# Patient Record
Sex: Female | Born: 1935 | Race: Black or African American | Hispanic: No | Smoking: Former smoker
Health system: Southern US, Community
[De-identification: ages and names within clinical notes are randomized; demographics above are authoritative.]

## PROBLEM LIST (undated history)

## (undated) ENCOUNTER — Emergency Department (HOSPITAL_COMMUNITY): Admission: EM | Payer: Medicare PPO | Source: Home / Self Care

## (undated) DIAGNOSIS — K5900 Constipation, unspecified: Secondary | ICD-10-CM

## (undated) DIAGNOSIS — F1020 Alcohol dependence, uncomplicated: Secondary | ICD-10-CM

## (undated) DIAGNOSIS — Z8719 Personal history of other diseases of the digestive system: Secondary | ICD-10-CM

## (undated) DIAGNOSIS — D631 Anemia in chronic kidney disease: Secondary | ICD-10-CM

## (undated) DIAGNOSIS — F1021 Alcohol dependence, in remission: Secondary | ICD-10-CM

## (undated) DIAGNOSIS — F0390 Unspecified dementia without behavioral disturbance: Secondary | ICD-10-CM

## (undated) DIAGNOSIS — K219 Gastro-esophageal reflux disease without esophagitis: Secondary | ICD-10-CM

## (undated) DIAGNOSIS — Z87442 Personal history of urinary calculi: Secondary | ICD-10-CM

## (undated) DIAGNOSIS — G8929 Other chronic pain: Secondary | ICD-10-CM

## (undated) DIAGNOSIS — K56609 Unspecified intestinal obstruction, unspecified as to partial versus complete obstruction: Secondary | ICD-10-CM

## (undated) DIAGNOSIS — R519 Headache, unspecified: Secondary | ICD-10-CM

## (undated) DIAGNOSIS — M549 Dorsalgia, unspecified: Secondary | ICD-10-CM

## (undated) DIAGNOSIS — I10 Essential (primary) hypertension: Secondary | ICD-10-CM

## (undated) DIAGNOSIS — B9620 Unspecified Escherichia coli [E. coli] as the cause of diseases classified elsewhere: Secondary | ICD-10-CM

## (undated) DIAGNOSIS — R079 Chest pain, unspecified: Secondary | ICD-10-CM

## (undated) DIAGNOSIS — F329 Major depressive disorder, single episode, unspecified: Secondary | ICD-10-CM

## (undated) DIAGNOSIS — Z992 Dependence on renal dialysis: Secondary | ICD-10-CM

## (undated) DIAGNOSIS — F419 Anxiety disorder, unspecified: Secondary | ICD-10-CM

## (undated) DIAGNOSIS — N185 Chronic kidney disease, stage 5: Secondary | ICD-10-CM

## (undated) DIAGNOSIS — E43 Unspecified severe protein-calorie malnutrition: Secondary | ICD-10-CM

## (undated) DIAGNOSIS — N186 End stage renal disease: Secondary | ICD-10-CM

## (undated) DIAGNOSIS — N390 Urinary tract infection, site not specified: Secondary | ICD-10-CM

## (undated) DIAGNOSIS — R7989 Other specified abnormal findings of blood chemistry: Secondary | ICD-10-CM

## (undated) DIAGNOSIS — D62 Acute posthemorrhagic anemia: Secondary | ICD-10-CM

## (undated) DIAGNOSIS — K862 Cyst of pancreas: Secondary | ICD-10-CM

## (undated) DIAGNOSIS — Z4659 Encounter for fitting and adjustment of other gastrointestinal appliance and device: Secondary | ICD-10-CM

## (undated) DIAGNOSIS — K921 Melena: Secondary | ICD-10-CM

## (undated) DIAGNOSIS — R410 Disorientation, unspecified: Secondary | ICD-10-CM

## (undated) DIAGNOSIS — S72001A Fracture of unspecified part of neck of right femur, initial encounter for closed fracture: Secondary | ICD-10-CM

## (undated) DIAGNOSIS — R778 Other specified abnormalities of plasma proteins: Secondary | ICD-10-CM

## (undated) DIAGNOSIS — R634 Abnormal weight loss: Secondary | ICD-10-CM

## (undated) DIAGNOSIS — F32A Depression, unspecified: Secondary | ICD-10-CM

## (undated) DIAGNOSIS — I4892 Unspecified atrial flutter: Secondary | ICD-10-CM

## (undated) DIAGNOSIS — Z8711 Personal history of peptic ulcer disease: Secondary | ICD-10-CM

## (undated) DIAGNOSIS — L0231 Cutaneous abscess of buttock: Secondary | ICD-10-CM

## (undated) DIAGNOSIS — N189 Chronic kidney disease, unspecified: Secondary | ICD-10-CM

## (undated) DIAGNOSIS — M1990 Unspecified osteoarthritis, unspecified site: Secondary | ICD-10-CM

## (undated) DIAGNOSIS — K259 Gastric ulcer, unspecified as acute or chronic, without hemorrhage or perforation: Secondary | ICD-10-CM

## (undated) DIAGNOSIS — K922 Gastrointestinal hemorrhage, unspecified: Secondary | ICD-10-CM

## (undated) DIAGNOSIS — K254 Chronic or unspecified gastric ulcer with hemorrhage: Secondary | ICD-10-CM

## (undated) DIAGNOSIS — R51 Headache: Secondary | ICD-10-CM

## (undated) HISTORY — PX: TIBIA FRACTURE SURGERY: SHX806

## (undated) HISTORY — DX: Anemia in chronic kidney disease: D63.1

## (undated) HISTORY — DX: Alcohol dependence, in remission: F10.21

## (undated) HISTORY — DX: Other specified abnormalities of plasma proteins: R77.8

## (undated) HISTORY — DX: Fracture of unspecified part of neck of right femur, initial encounter for closed fracture: S72.001A

## (undated) HISTORY — DX: Personal history of other diseases of the digestive system: Z87.19

## (undated) HISTORY — DX: Chest pain, unspecified: R07.9

## (undated) HISTORY — DX: Disorientation, unspecified: R41.0

## (undated) HISTORY — DX: Unspecified intestinal obstruction, unspecified as to partial versus complete obstruction: K56.609

## (undated) HISTORY — PX: TUBAL LIGATION: SHX77

## (undated) HISTORY — DX: Chronic or unspecified gastric ulcer with hemorrhage: K25.4

## (undated) HISTORY — DX: Cutaneous abscess of buttock: L02.31

## (undated) HISTORY — DX: Abnormal weight loss: R63.4

## (undated) HISTORY — DX: Other specified abnormal findings of blood chemistry: R79.89

## (undated) HISTORY — PX: CATARACT EXTRACTION: SUR2

## (undated) HISTORY — PX: ABDOMINAL HYSTERECTOMY: SHX81

## (undated) HISTORY — DX: Acute posthemorrhagic anemia: D62

## (undated) HISTORY — DX: Gastrointestinal hemorrhage, unspecified: K92.2

## (undated) HISTORY — DX: Unspecified severe protein-calorie malnutrition: E43

## (undated) HISTORY — DX: Unspecified Escherichia coli (E. coli) as the cause of diseases classified elsewhere: B96.20

## (undated) HISTORY — DX: Chronic kidney disease, stage 5: N18.5

## (undated) HISTORY — DX: Urinary tract infection, site not specified: N39.0

## (undated) HISTORY — PX: FOOT SURGERY: SHX648

## (undated) HISTORY — DX: Melena: K92.1

## (undated) HISTORY — DX: Encounter for fitting and adjustment of other gastrointestinal appliance and device: Z46.59

---

## 1978-12-08 HISTORY — PX: EXPLORATORY LAPAROTOMY: SUR591

## 1978-12-08 HISTORY — PX: TOTAL ABDOMINAL HYSTERECTOMY W/ BILATERAL SALPINGOOPHORECTOMY: SHX83

## 1998-04-18 ENCOUNTER — Ambulatory Visit (HOSPITAL_COMMUNITY): Admission: RE | Admit: 1998-04-18 | Discharge: 1998-04-18 | Payer: Self-pay | Admitting: Orthopedic Surgery

## 1998-04-18 ENCOUNTER — Ambulatory Visit (HOSPITAL_COMMUNITY): Admission: RE | Admit: 1998-04-18 | Discharge: 1998-04-18 | Payer: Self-pay | Admitting: Nephrology

## 1998-08-24 ENCOUNTER — Ambulatory Visit (HOSPITAL_COMMUNITY): Admission: RE | Admit: 1998-08-24 | Discharge: 1998-08-24 | Payer: Self-pay | Admitting: Nephrology

## 1998-08-24 ENCOUNTER — Encounter: Payer: Self-pay | Admitting: Nephrology

## 1999-01-25 ENCOUNTER — Ambulatory Visit (HOSPITAL_COMMUNITY): Admission: RE | Admit: 1999-01-25 | Discharge: 1999-01-25 | Payer: Self-pay | Admitting: Obstetrics and Gynecology

## 1999-01-25 ENCOUNTER — Encounter: Payer: Self-pay | Admitting: Obstetrics and Gynecology

## 1999-04-03 ENCOUNTER — Encounter: Payer: Self-pay | Admitting: Nephrology

## 1999-04-03 ENCOUNTER — Inpatient Hospital Stay (HOSPITAL_COMMUNITY): Admission: EM | Admit: 1999-04-03 | Discharge: 1999-04-09 | Payer: Self-pay | Admitting: Emergency Medicine

## 1999-04-04 ENCOUNTER — Encounter: Payer: Self-pay | Admitting: Nephrology

## 1999-04-05 ENCOUNTER — Encounter: Payer: Self-pay | Admitting: Nephrology

## 1999-04-06 ENCOUNTER — Encounter: Payer: Self-pay | Admitting: Nephrology

## 1999-04-07 ENCOUNTER — Encounter: Payer: Self-pay | Admitting: Nephrology

## 1999-04-08 ENCOUNTER — Encounter: Payer: Self-pay | Admitting: Nephrology

## 1999-07-18 ENCOUNTER — Encounter: Payer: Self-pay | Admitting: Cardiology

## 1999-07-18 ENCOUNTER — Ambulatory Visit (HOSPITAL_COMMUNITY): Admission: RE | Admit: 1999-07-18 | Discharge: 1999-07-18 | Payer: Self-pay | Admitting: Cardiology

## 1999-10-19 ENCOUNTER — Encounter: Payer: Self-pay | Admitting: Emergency Medicine

## 1999-10-19 ENCOUNTER — Inpatient Hospital Stay (HOSPITAL_COMMUNITY): Admission: EM | Admit: 1999-10-19 | Discharge: 1999-10-22 | Payer: Self-pay | Admitting: Emergency Medicine

## 1999-10-20 ENCOUNTER — Encounter (HOSPITAL_BASED_OUTPATIENT_CLINIC_OR_DEPARTMENT_OTHER): Payer: Self-pay | Admitting: General Surgery

## 1999-10-21 ENCOUNTER — Encounter (HOSPITAL_BASED_OUTPATIENT_CLINIC_OR_DEPARTMENT_OTHER): Payer: Self-pay | Admitting: General Surgery

## 1999-11-11 ENCOUNTER — Emergency Department (HOSPITAL_COMMUNITY): Admission: EM | Admit: 1999-11-11 | Discharge: 1999-11-11 | Payer: Self-pay | Admitting: Emergency Medicine

## 1999-11-11 ENCOUNTER — Encounter: Payer: Self-pay | Admitting: Emergency Medicine

## 2000-05-18 ENCOUNTER — Other Ambulatory Visit: Admission: RE | Admit: 2000-05-18 | Discharge: 2000-05-18 | Payer: Self-pay | Admitting: Nephrology

## 2000-07-09 ENCOUNTER — Encounter: Payer: Self-pay | Admitting: Obstetrics and Gynecology

## 2000-07-09 ENCOUNTER — Ambulatory Visit (HOSPITAL_COMMUNITY): Admission: RE | Admit: 2000-07-09 | Discharge: 2000-07-09 | Payer: Self-pay | Admitting: Obstetrics and Gynecology

## 2000-10-13 ENCOUNTER — Encounter: Admission: RE | Admit: 2000-10-13 | Discharge: 2000-10-13 | Payer: Self-pay | Admitting: Nephrology

## 2000-10-13 ENCOUNTER — Encounter: Payer: Self-pay | Admitting: Nephrology

## 2002-05-30 ENCOUNTER — Emergency Department (HOSPITAL_COMMUNITY): Admission: EM | Admit: 2002-05-30 | Discharge: 2002-05-30 | Payer: Self-pay | Admitting: Emergency Medicine

## 2002-09-01 ENCOUNTER — Encounter: Admission: RE | Admit: 2002-09-01 | Discharge: 2002-09-01 | Payer: Self-pay | Admitting: Nephrology

## 2002-09-01 ENCOUNTER — Encounter: Payer: Self-pay | Admitting: Nephrology

## 2003-04-17 ENCOUNTER — Encounter: Payer: Self-pay | Admitting: Emergency Medicine

## 2003-04-17 ENCOUNTER — Emergency Department (HOSPITAL_COMMUNITY): Admission: EM | Admit: 2003-04-17 | Discharge: 2003-04-17 | Payer: Self-pay | Admitting: Emergency Medicine

## 2003-05-26 ENCOUNTER — Ambulatory Visit (HOSPITAL_COMMUNITY): Admission: RE | Admit: 2003-05-26 | Discharge: 2003-05-26 | Payer: Self-pay | Admitting: Nephrology

## 2003-05-26 ENCOUNTER — Encounter: Payer: Self-pay | Admitting: Nephrology

## 2003-07-04 ENCOUNTER — Emergency Department (HOSPITAL_COMMUNITY): Admission: EM | Admit: 2003-07-04 | Discharge: 2003-07-04 | Payer: Self-pay | Admitting: Emergency Medicine

## 2003-07-18 ENCOUNTER — Other Ambulatory Visit: Admission: RE | Admit: 2003-07-18 | Discharge: 2003-07-18 | Payer: Self-pay | Admitting: Obstetrics and Gynecology

## 2003-08-09 HISTORY — PX: SHOULDER ARTHROSCOPY W/ ROTATOR CUFF REPAIR: SHX2400

## 2003-08-16 ENCOUNTER — Ambulatory Visit (HOSPITAL_BASED_OUTPATIENT_CLINIC_OR_DEPARTMENT_OTHER): Admission: RE | Admit: 2003-08-16 | Discharge: 2003-08-16 | Payer: Self-pay | Admitting: Orthopedic Surgery

## 2003-10-02 ENCOUNTER — Encounter: Payer: Self-pay | Admitting: Nephrology

## 2003-10-02 ENCOUNTER — Encounter: Admission: RE | Admit: 2003-10-02 | Discharge: 2003-10-02 | Payer: Self-pay | Admitting: Nephrology

## 2003-10-06 ENCOUNTER — Ambulatory Visit (HOSPITAL_COMMUNITY): Admission: RE | Admit: 2003-10-06 | Discharge: 2003-10-06 | Payer: Self-pay | Admitting: Neurology

## 2003-10-19 ENCOUNTER — Ambulatory Visit (HOSPITAL_COMMUNITY): Admission: RE | Admit: 2003-10-19 | Discharge: 2003-10-19 | Payer: Self-pay | Admitting: Obstetrics and Gynecology

## 2003-10-25 ENCOUNTER — Ambulatory Visit (HOSPITAL_COMMUNITY): Admission: RE | Admit: 2003-10-25 | Discharge: 2003-10-25 | Payer: Self-pay | Admitting: Nephrology

## 2003-12-04 ENCOUNTER — Ambulatory Visit (HOSPITAL_COMMUNITY): Admission: RE | Admit: 2003-12-04 | Discharge: 2003-12-04 | Payer: Self-pay | Admitting: Nephrology

## 2003-12-06 ENCOUNTER — Ambulatory Visit (HOSPITAL_COMMUNITY): Admission: RE | Admit: 2003-12-06 | Discharge: 2003-12-06 | Payer: Self-pay | Admitting: Nephrology

## 2003-12-20 ENCOUNTER — Inpatient Hospital Stay (HOSPITAL_COMMUNITY): Admission: EM | Admit: 2003-12-20 | Discharge: 2003-12-26 | Payer: Self-pay | Admitting: Emergency Medicine

## 2004-01-24 ENCOUNTER — Encounter: Admission: RE | Admit: 2004-01-24 | Discharge: 2004-01-24 | Payer: Self-pay | Admitting: *Deleted

## 2005-10-21 ENCOUNTER — Encounter: Admission: RE | Admit: 2005-10-21 | Discharge: 2005-10-21 | Payer: Self-pay | Admitting: Nephrology

## 2005-11-17 ENCOUNTER — Other Ambulatory Visit: Admission: RE | Admit: 2005-11-17 | Discharge: 2005-11-17 | Payer: Self-pay | Admitting: *Deleted

## 2006-02-21 ENCOUNTER — Emergency Department (HOSPITAL_COMMUNITY): Admission: EM | Admit: 2006-02-21 | Discharge: 2006-02-21 | Payer: Self-pay | Admitting: Emergency Medicine

## 2006-04-24 ENCOUNTER — Emergency Department (HOSPITAL_COMMUNITY): Admission: EM | Admit: 2006-04-24 | Discharge: 2006-04-24 | Payer: Self-pay | Admitting: Emergency Medicine

## 2006-10-07 ENCOUNTER — Ambulatory Visit (HOSPITAL_COMMUNITY): Admission: RE | Admit: 2006-10-07 | Discharge: 2006-10-07 | Payer: Self-pay | Admitting: Neurology

## 2007-09-15 ENCOUNTER — Encounter: Admission: RE | Admit: 2007-09-15 | Discharge: 2007-09-15 | Payer: Self-pay | Admitting: Nephrology

## 2007-11-19 ENCOUNTER — Emergency Department (HOSPITAL_COMMUNITY): Admission: EM | Admit: 2007-11-19 | Discharge: 2007-11-19 | Payer: Self-pay | Admitting: Family Medicine

## 2008-12-08 HISTORY — PX: COLONOSCOPY: SHX174

## 2009-01-25 ENCOUNTER — Inpatient Hospital Stay (HOSPITAL_COMMUNITY): Admission: EM | Admit: 2009-01-25 | Discharge: 2009-01-29 | Payer: Self-pay | Admitting: Emergency Medicine

## 2009-03-11 ENCOUNTER — Emergency Department (HOSPITAL_COMMUNITY): Admission: EM | Admit: 2009-03-11 | Discharge: 2009-03-11 | Payer: Self-pay | Admitting: Family Medicine

## 2009-06-08 ENCOUNTER — Emergency Department (HOSPITAL_COMMUNITY): Admission: EM | Admit: 2009-06-08 | Discharge: 2009-06-08 | Payer: Self-pay | Admitting: Family Medicine

## 2009-07-08 ENCOUNTER — Emergency Department (HOSPITAL_COMMUNITY): Admission: EM | Admit: 2009-07-08 | Discharge: 2009-07-08 | Payer: Self-pay | Admitting: Family Medicine

## 2010-03-14 ENCOUNTER — Ambulatory Visit (HOSPITAL_COMMUNITY): Admission: RE | Admit: 2010-03-14 | Discharge: 2010-03-14 | Payer: Self-pay | Admitting: Nephrology

## 2010-09-07 DIAGNOSIS — K862 Cyst of pancreas: Secondary | ICD-10-CM

## 2010-09-07 HISTORY — DX: Cyst of pancreas: K86.2

## 2010-09-12 ENCOUNTER — Emergency Department (HOSPITAL_COMMUNITY): Admission: EM | Admit: 2010-09-12 | Discharge: 2010-09-12 | Payer: Self-pay | Admitting: Family Medicine

## 2010-09-13 ENCOUNTER — Observation Stay (HOSPITAL_COMMUNITY): Admission: EM | Admit: 2010-09-13 | Discharge: 2010-09-16 | Payer: Self-pay | Admitting: Emergency Medicine

## 2010-09-13 ENCOUNTER — Ambulatory Visit: Payer: Self-pay | Admitting: Gastroenterology

## 2010-10-28 ENCOUNTER — Encounter: Admission: RE | Admit: 2010-10-28 | Discharge: 2010-10-28 | Payer: Self-pay | Admitting: Nephrology

## 2010-12-28 ENCOUNTER — Encounter: Payer: Self-pay | Admitting: Nephrology

## 2010-12-29 ENCOUNTER — Encounter: Payer: Self-pay | Admitting: Nephrology

## 2010-12-30 ENCOUNTER — Encounter: Payer: Self-pay | Admitting: Nephrology

## 2011-02-19 LAB — SAMPLE TO BLOOD BANK

## 2011-02-19 LAB — POCT I-STAT, CHEM 8
BUN: 34 mg/dL — ABNORMAL HIGH (ref 6–23)
Creatinine, Ser: 3 mg/dL — ABNORMAL HIGH (ref 0.4–1.2)
HCT: 40 % (ref 36.0–46.0)
Hemoglobin: 13.6 g/dL (ref 12.0–15.0)
Potassium: 4.1 mEq/L (ref 3.5–5.1)
TCO2: 25 mmol/L (ref 0–100)

## 2011-02-19 LAB — DIFFERENTIAL
Basophils Absolute: 0 K/uL (ref 0.0–0.1)
Basophils Relative: 0 % (ref 0–1)
Eosinophils Absolute: 0.2 10*3/uL (ref 0.0–0.7)
Eosinophils Relative: 3 % (ref 0–5)
Lymphocytes Relative: 43 % (ref 12–46)
Lymphs Abs: 3.5 K/uL (ref 0.7–4.0)
Monocytes Absolute: 0.6 K/uL (ref 0.1–1.0)
Monocytes Relative: 7 % (ref 3–12)
Neutro Abs: 3.8 K/uL (ref 1.7–7.7)
Neutrophils Relative %: 48 % (ref 43–77)

## 2011-02-19 LAB — PROTIME-INR
INR: 0.93 (ref 0.00–1.49)
Prothrombin Time: 12.7 s (ref 11.6–15.2)

## 2011-02-19 LAB — CBC
HCT: 30.8 % — ABNORMAL LOW (ref 36.0–46.0)
HCT: 32.9 % — ABNORMAL LOW (ref 36.0–46.0)
HCT: 33.3 % — ABNORMAL LOW (ref 36.0–46.0)
HCT: 35.6 % — ABNORMAL LOW (ref 36.0–46.0)
HCT: 36 % (ref 36.0–46.0)
Hemoglobin: 10.8 g/dL — ABNORMAL LOW (ref 12.0–15.0)
Hemoglobin: 10.9 g/dL — ABNORMAL LOW (ref 12.0–15.0)
Hemoglobin: 11.7 g/dL — ABNORMAL LOW (ref 12.0–15.0)
Hemoglobin: 12 g/dL (ref 12.0–15.0)
MCH: 28.1 pg (ref 26.0–34.0)
MCH: 28.1 pg (ref 26.0–34.0)
MCHC: 32.7 g/dL (ref 30.0–36.0)
MCHC: 32.8 g/dL (ref 30.0–36.0)
MCHC: 32.9 g/dL (ref 30.0–36.0)
MCHC: 33.3 g/dL (ref 30.0–36.0)
MCV: 83.9 fL (ref 78.0–100.0)
MCV: 85.4 fL (ref 78.0–100.0)
MCV: 85.7 fL (ref 78.0–100.0)
MCV: 85.8 fL (ref 78.0–100.0)
Platelets: 230 K/uL (ref 150–400)
RBC: 3.59 MIL/uL — ABNORMAL LOW (ref 3.87–5.11)
RBC: 3.82 MIL/uL — ABNORMAL LOW (ref 3.87–5.11)
RBC: 4.17 MIL/uL (ref 3.87–5.11)
RDW: 13.6 % (ref 11.5–15.5)
RDW: 13.6 % (ref 11.5–15.5)
RDW: 13.6 % (ref 11.5–15.5)
WBC: 6.7 10*3/uL (ref 4.0–10.5)
WBC: 6.8 10*3/uL (ref 4.0–10.5)
WBC: 6.8 10*3/uL (ref 4.0–10.5)
WBC: 7.5 10*3/uL (ref 4.0–10.5)
WBC: 8.1 10*3/uL (ref 4.0–10.5)
WBC: 8.1 10*3/uL (ref 4.0–10.5)

## 2011-02-19 LAB — COMPREHENSIVE METABOLIC PANEL
ALT: 15 U/L (ref 0–35)
Albumin: 3.7 g/dL (ref 3.5–5.2)
CO2: 24 mEq/L (ref 19–32)
Chloride: 110 mEq/L (ref 96–112)
Creatinine, Ser: 3.05 mg/dL — ABNORMAL HIGH (ref 0.4–1.2)
GFR calc non Af Amer: 15 mL/min — ABNORMAL LOW (ref >60)
Potassium: 4.3 mEq/L (ref 3.5–5.1)
Total Protein: 7.1 g/dL (ref 6.0–8.3)

## 2011-02-19 LAB — COMPREHENSIVE METABOLIC PANEL WITH GFR
AST: 21 U/L (ref 0–37)
Alkaline Phosphatase: 67 U/L (ref 39–117)
BUN: 33 mg/dL — ABNORMAL HIGH (ref 6–23)
Calcium: 8.8 mg/dL (ref 8.4–10.5)
GFR calc Af Amer: 18 mL/min — ABNORMAL LOW (ref >60)
Glucose, Bld: 96 mg/dL (ref 70–99)
Sodium: 141 meq/L (ref 135–145)
Total Bilirubin: 0.3 mg/dL (ref 0.3–1.2)

## 2011-02-19 LAB — STOOL CULTURE

## 2011-02-19 LAB — URINALYSIS, ROUTINE W REFLEX MICROSCOPIC
Bilirubin Urine: NEGATIVE
Hgb urine dipstick: NEGATIVE
Ketones, ur: NEGATIVE mg/dL
Nitrite: NEGATIVE

## 2011-02-19 LAB — BASIC METABOLIC PANEL
BUN: 25 mg/dL — ABNORMAL HIGH (ref 6–23)
CO2: 24 mEq/L (ref 19–32)
CO2: 25 mEq/L (ref 19–32)
Calcium: 8.3 mg/dL — ABNORMAL LOW (ref 8.4–10.5)
Calcium: 8.6 mg/dL (ref 8.4–10.5)
Chloride: 108 mEq/L (ref 96–112)
Creatinine, Ser: 2.64 mg/dL — ABNORMAL HIGH (ref 0.4–1.2)
Creatinine, Ser: 2.74 mg/dL — ABNORMAL HIGH (ref 0.4–1.2)
GFR calc Af Amer: 21 mL/min — ABNORMAL LOW (ref >60)
GFR calc non Af Amer: 17 mL/min — ABNORMAL LOW (ref >60)
GFR calc non Af Amer: 17 mL/min — ABNORMAL LOW (ref >60)
Glucose, Bld: 94 mg/dL (ref 70–99)
Potassium: 3.9 mEq/L (ref 3.5–5.1)
Potassium: 4 mEq/L (ref 3.5–5.1)
Potassium: 4 mEq/L (ref 3.5–5.1)
Sodium: 139 mEq/L (ref 135–145)
Sodium: 140 mEq/L (ref 135–145)
Sodium: 141 mEq/L (ref 135–145)

## 2011-02-19 LAB — URINE CULTURE: Culture  Setup Time: 201110091201

## 2011-02-19 LAB — APTT: aPTT: 26 s (ref 24–37)

## 2011-03-15 LAB — POCT URINALYSIS DIP (DEVICE)
Bilirubin Urine: NEGATIVE
Ketones, ur: NEGATIVE mg/dL
pH: 5 (ref 5.0–8.0)

## 2011-03-16 LAB — POCT I-STAT, CHEM 8
BUN: 38 mg/dL — ABNORMAL HIGH (ref 6–23)
Calcium, Ion: 1.07 mmol/L — ABNORMAL LOW (ref 1.12–1.32)
Creatinine, Ser: 2.8 mg/dL — ABNORMAL HIGH (ref 0.4–1.2)
Glucose, Bld: 97 mg/dL (ref 70–99)
TCO2: 25 mmol/L (ref 0–100)

## 2011-03-16 LAB — POCT URINALYSIS DIP (DEVICE)
Ketones, ur: NEGATIVE mg/dL
Protein, ur: 100 mg/dL — AB
Specific Gravity, Urine: 1.01 (ref 1.005–1.030)
pH: 6 (ref 5.0–8.0)

## 2011-03-25 LAB — COMPREHENSIVE METABOLIC PANEL
AST: 16 U/L (ref 0–37)
AST: 17 U/L (ref 0–37)
Albumin: 3.1 g/dL — ABNORMAL LOW (ref 3.5–5.2)
Albumin: 3.9 g/dL (ref 3.5–5.2)
Alkaline Phosphatase: 69 U/L (ref 39–117)
BUN: 17 mg/dL (ref 6–23)
BUN: 23 mg/dL (ref 6–23)
CO2: 26 mEq/L (ref 19–32)
Calcium: 8.6 mg/dL (ref 8.4–10.5)
Chloride: 107 mEq/L (ref 96–112)
Chloride: 108 mEq/L (ref 96–112)
Chloride: 109 mEq/L (ref 96–112)
Creatinine, Ser: 2.34 mg/dL — ABNORMAL HIGH (ref 0.4–1.2)
Creatinine, Ser: 2.42 mg/dL — ABNORMAL HIGH (ref 0.4–1.2)
GFR calc Af Amer: 25 mL/min — ABNORMAL LOW (ref >60)
GFR calc Af Amer: 25 mL/min — ABNORMAL LOW (ref >60)
GFR calc non Af Amer: 20 mL/min — ABNORMAL LOW (ref >60)
Potassium: 3.9 mEq/L (ref 3.5–5.1)
Sodium: 139 mEq/L (ref 135–145)
Total Bilirubin: 0.4 mg/dL (ref 0.3–1.2)
Total Bilirubin: 0.7 mg/dL (ref 0.3–1.2)
Total Bilirubin: 0.8 mg/dL (ref 0.3–1.2)
Total Protein: 6.2 g/dL (ref 6.0–8.3)
Total Protein: 7.6 g/dL (ref 6.0–8.3)

## 2011-03-25 LAB — DIFFERENTIAL
Basophils Absolute: 0 10*3/uL (ref 0.0–0.1)
Basophils Absolute: 0 10*3/uL (ref 0.0–0.1)
Basophils Absolute: 0.1 10*3/uL (ref 0.0–0.1)
Basophils Absolute: 0.1 10*3/uL (ref 0.0–0.1)
Basophils Relative: 1 % (ref 0–1)
Basophils Relative: 1 % (ref 0–1)
Eosinophils Relative: 3 % (ref 0–5)
Lymphocytes Relative: 25 % (ref 12–46)
Lymphocytes Relative: 29 % (ref 12–46)
Lymphocytes Relative: 34 % (ref 12–46)
Lymphs Abs: 2 10*3/uL (ref 0.7–4.0)
Monocytes Absolute: 0.5 10*3/uL (ref 0.1–1.0)
Monocytes Absolute: 0.6 10*3/uL (ref 0.1–1.0)
Monocytes Relative: 6 % (ref 3–12)
Neutro Abs: 4.2 10*3/uL (ref 1.7–7.7)
Neutro Abs: 4.5 10*3/uL (ref 1.7–7.7)
Neutro Abs: 6.6 10*3/uL (ref 1.7–7.7)
Neutrophils Relative %: 55 % (ref 43–77)
Neutrophils Relative %: 61 % (ref 43–77)

## 2011-03-25 LAB — LIPID PANEL
HDL: 29 mg/dL — ABNORMAL LOW (ref >39)
HDL: 30 mg/dL — ABNORMAL LOW (ref >39)
LDL Cholesterol: 86 mg/dL (ref 0–99)
Total CHOL/HDL Ratio: 5.6 RATIO
Triglycerides: 256 mg/dL — ABNORMAL HIGH (ref <150)
VLDL: 51 mg/dL — ABNORMAL HIGH (ref 0–40)
VLDL: 55 mg/dL — ABNORMAL HIGH (ref 0–40)

## 2011-03-25 LAB — CBC
HCT: 33 % — ABNORMAL LOW (ref 36.0–46.0)
HCT: 42.7 % (ref 36.0–46.0)
MCHC: 32.5 g/dL (ref 30.0–36.0)
MCHC: 32.7 g/dL (ref 30.0–36.0)
MCV: 90.5 fL (ref 78.0–100.0)
MCV: 91.4 fL (ref 78.0–100.0)
Platelets: 167 10*3/uL (ref 150–400)
Platelets: 189 10*3/uL (ref 150–400)
Platelets: 207 10*3/uL (ref 150–400)
RBC: 3.64 MIL/uL — ABNORMAL LOW (ref 3.87–5.11)
RDW: 12.6 % (ref 11.5–15.5)
RDW: 12.6 % (ref 11.5–15.5)
WBC: 6.9 10*3/uL (ref 4.0–10.5)
WBC: 8.1 10*3/uL (ref 4.0–10.5)

## 2011-03-25 LAB — URINE MICROSCOPIC-ADD ON

## 2011-03-25 LAB — RENAL FUNCTION PANEL
Albumin: 3.5 g/dL (ref 3.5–5.2)
Calcium: 9.4 mg/dL (ref 8.4–10.5)
Phosphorus: 3.2 mg/dL (ref 2.3–4.6)
Potassium: 4.4 mEq/L (ref 3.5–5.1)
Sodium: 139 mEq/L (ref 135–145)

## 2011-03-25 LAB — URINALYSIS, ROUTINE W REFLEX MICROSCOPIC
Bilirubin Urine: NEGATIVE
Leukocytes, UA: NEGATIVE
Nitrite: NEGATIVE
Specific Gravity, Urine: 1.012 (ref 1.005–1.030)
pH: 5.5 (ref 5.0–8.0)

## 2011-03-25 LAB — URIC ACID, RANDOM URINE: Uric Acid, Urine: 16.3 mg/dL

## 2011-03-25 LAB — RPR: RPR Ser Ql: NONREACTIVE

## 2011-03-25 LAB — FOLATE RBC: RBC Folate: 619 ng/mL — ABNORMAL HIGH (ref 180–600)

## 2011-03-25 LAB — URIC ACID: Uric Acid, Serum: 6.7 mg/dL (ref 2.4–7.0)

## 2011-03-25 LAB — CK: Total CK: 694 U/L — ABNORMAL HIGH (ref 7–177)

## 2011-03-25 LAB — VITAMIN B12: Vitamin B-12: 410 pg/mL (ref 211–911)

## 2011-03-30 ENCOUNTER — Emergency Department (HOSPITAL_COMMUNITY): Payer: Medicare Other

## 2011-03-30 ENCOUNTER — Inpatient Hospital Stay (HOSPITAL_COMMUNITY)
Admission: EM | Admit: 2011-03-30 | Discharge: 2011-04-08 | DRG: 493 | Disposition: A | Discharge status: Skilled Nursing Facility | Payer: Medicare Other | Attending: Specialist | Admitting: Specialist

## 2011-03-30 DIAGNOSIS — I129 Hypertensive chronic kidney disease with stage 1 through stage 4 chronic kidney disease, or unspecified chronic kidney disease: Secondary | ICD-10-CM | POA: Diagnosis present

## 2011-03-30 DIAGNOSIS — X500XXA Overexertion from strenuous movement or load, initial encounter: Secondary | ICD-10-CM | POA: Diagnosis present

## 2011-03-30 DIAGNOSIS — F0390 Unspecified dementia without behavioral disturbance: Secondary | ICD-10-CM | POA: Diagnosis present

## 2011-03-30 DIAGNOSIS — Y92009 Unspecified place in unspecified non-institutional (private) residence as the place of occurrence of the external cause: Secondary | ICD-10-CM

## 2011-03-30 DIAGNOSIS — D62 Acute posthemorrhagic anemia: Secondary | ICD-10-CM | POA: Diagnosis not present

## 2011-03-30 DIAGNOSIS — Z87891 Personal history of nicotine dependence: Secondary | ICD-10-CM

## 2011-03-30 DIAGNOSIS — F1020 Alcohol dependence, uncomplicated: Secondary | ICD-10-CM | POA: Diagnosis present

## 2011-03-30 DIAGNOSIS — S82209A Unspecified fracture of shaft of unspecified tibia, initial encounter for closed fracture: Principal | ICD-10-CM | POA: Diagnosis present

## 2011-03-30 DIAGNOSIS — D638 Anemia in other chronic diseases classified elsewhere: Secondary | ICD-10-CM | POA: Diagnosis present

## 2011-03-30 DIAGNOSIS — N184 Chronic kidney disease, stage 4 (severe): Secondary | ICD-10-CM | POA: Diagnosis present

## 2011-03-30 DIAGNOSIS — K219 Gastro-esophageal reflux disease without esophagitis: Secondary | ICD-10-CM | POA: Diagnosis present

## 2011-03-30 DIAGNOSIS — Z880 Allergy status to penicillin: Secondary | ICD-10-CM

## 2011-03-30 DIAGNOSIS — Z882 Allergy status to sulfonamides status: Secondary | ICD-10-CM

## 2011-03-30 DIAGNOSIS — R296 Repeated falls: Secondary | ICD-10-CM | POA: Diagnosis present

## 2011-03-30 DIAGNOSIS — Z888 Allergy status to other drugs, medicaments and biological substances status: Secondary | ICD-10-CM

## 2011-03-30 LAB — DIFFERENTIAL
Basophils Relative: 0 % (ref 0–1)
Eosinophils Absolute: 0 10*3/uL (ref 0.0–0.7)
Eosinophils Relative: 0 % (ref 0–5)
Lymphs Abs: 2.4 10*3/uL (ref 0.7–4.0)
Monocytes Relative: 7 % (ref 3–12)
Neutrophils Relative %: 73 % (ref 43–77)

## 2011-03-30 LAB — BASIC METABOLIC PANEL
CO2: 24 mEq/L (ref 19–32)
Calcium: 9.4 mg/dL (ref 8.4–10.5)
Creatinine, Ser: 2.43 mg/dL — ABNORMAL HIGH (ref 0.4–1.2)
GFR calc non Af Amer: 19 mL/min — ABNORMAL LOW (ref >60)
Glucose, Bld: 114 mg/dL — ABNORMAL HIGH (ref 70–99)
Sodium: 140 mEq/L (ref 135–145)

## 2011-03-30 LAB — CBC
MCH: 27.9 pg (ref 26.0–34.0)
MCV: 83.7 fL (ref 78.0–100.0)
Platelets: 253 10*3/uL (ref 150–400)
RBC: 4.48 MIL/uL (ref 3.87–5.11)
RDW: 14 % (ref 11.5–15.5)
WBC: 11.8 10*3/uL — ABNORMAL HIGH (ref 4.0–10.5)

## 2011-03-30 LAB — PROTIME-INR: Prothrombin Time: 13 seconds (ref 11.6–15.2)

## 2011-03-31 ENCOUNTER — Inpatient Hospital Stay (HOSPITAL_COMMUNITY): Payer: Medicare Other

## 2011-03-31 LAB — BASIC METABOLIC PANEL
BUN: 22 mg/dL (ref 6–23)
Calcium: 8.4 mg/dL (ref 8.4–10.5)
Chloride: 107 mEq/L (ref 96–112)
Creatinine, Ser: 2.36 mg/dL — ABNORMAL HIGH (ref 0.4–1.2)
GFR calc Af Amer: 24 mL/min — ABNORMAL LOW (ref >60)
GFR calc non Af Amer: 20 mL/min — ABNORMAL LOW (ref >60)

## 2011-03-31 LAB — CBC
MCH: 28.1 pg (ref 26.0–34.0)
MCHC: 33.8 g/dL (ref 30.0–36.0)
MCV: 83.2 fL (ref 78.0–100.0)
Platelets: ADEQUATE 10*3/uL (ref 150–400)
RDW: 14.2 % (ref 11.5–15.5)

## 2011-04-01 LAB — BASIC METABOLIC PANEL
BUN: 20 mg/dL (ref 6–23)
CO2: 27 mEq/L (ref 19–32)
Calcium: 8.7 mg/dL (ref 8.4–10.5)
Glucose, Bld: 110 mg/dL — ABNORMAL HIGH (ref 70–99)
Sodium: 142 mEq/L (ref 135–145)

## 2011-04-01 LAB — SURGICAL PCR SCREEN
MRSA, PCR: NEGATIVE
Staphylococcus aureus: POSITIVE — AB

## 2011-04-01 LAB — CBC
Hemoglobin: 10.7 g/dL — ABNORMAL LOW (ref 12.0–15.0)
MCH: 26.8 pg (ref 26.0–34.0)
MCHC: 31.9 g/dL (ref 30.0–36.0)
RDW: 14 % (ref 11.5–15.5)

## 2011-04-01 LAB — PROTIME-INR: Prothrombin Time: 13.4 seconds (ref 11.6–15.2)

## 2011-04-02 ENCOUNTER — Inpatient Hospital Stay (HOSPITAL_COMMUNITY): Payer: Medicare Other

## 2011-04-02 LAB — URIC ACID: Uric Acid, Serum: 6.3 mg/dL (ref 2.4–7.0)

## 2011-04-02 LAB — RENAL FUNCTION PANEL
Albumin: 3.2 g/dL — ABNORMAL LOW (ref 3.5–5.2)
BUN: 19 mg/dL (ref 6–23)
Chloride: 101 mEq/L (ref 96–112)
Glucose, Bld: 126 mg/dL — ABNORMAL HIGH (ref 70–99)
Potassium: 4.3 mEq/L (ref 3.5–5.1)

## 2011-04-02 LAB — DIFFERENTIAL
Lymphocytes Relative: 18 % (ref 12–46)
Lymphs Abs: 2.2 10*3/uL (ref 0.7–4.0)
Neutro Abs: 8.6 10*3/uL — ABNORMAL HIGH (ref 1.7–7.7)
Neutrophils Relative %: 71 % (ref 43–77)

## 2011-04-02 LAB — CBC
HCT: 33 % — ABNORMAL LOW (ref 36.0–46.0)
MCV: 82.9 fL (ref 78.0–100.0)
Platelets: 207 10*3/uL (ref 150–400)
RBC: 3.98 MIL/uL (ref 3.87–5.11)
WBC: 12.1 10*3/uL — ABNORMAL HIGH (ref 4.0–10.5)

## 2011-04-03 LAB — URINE MICROSCOPIC-ADD ON

## 2011-04-03 LAB — URINALYSIS, ROUTINE W REFLEX MICROSCOPIC
Glucose, UA: NEGATIVE mg/dL
Ketones, ur: NEGATIVE mg/dL
Nitrite: NEGATIVE
Specific Gravity, Urine: 1.014 (ref 1.005–1.030)
pH: 7 (ref 5.0–8.0)

## 2011-04-03 LAB — BASIC METABOLIC PANEL
BUN: 20 mg/dL (ref 6–23)
Chloride: 100 mEq/L (ref 96–112)
Potassium: 4 mEq/L (ref 3.5–5.1)
Sodium: 139 mEq/L (ref 135–145)

## 2011-04-03 LAB — CBC
MCV: 81.5 fL (ref 78.0–100.0)
Platelets: 228 10*3/uL (ref 150–400)
RBC: 4 MIL/uL (ref 3.87–5.11)
WBC: 12.1 10*3/uL — ABNORMAL HIGH (ref 4.0–10.5)

## 2011-04-04 LAB — RENAL FUNCTION PANEL
Albumin: 2.6 g/dL — ABNORMAL LOW (ref 3.5–5.2)
Chloride: 101 mEq/L (ref 96–112)
GFR calc Af Amer: 22 mL/min — ABNORMAL LOW (ref >60)
GFR calc non Af Amer: 18 mL/min — ABNORMAL LOW (ref >60)
Phosphorus: 3.6 mg/dL (ref 2.3–4.6)
Potassium: 3.6 mEq/L (ref 3.5–5.1)
Sodium: 135 mEq/L (ref 135–145)

## 2011-04-04 LAB — URINE CULTURE: Culture  Setup Time: 201204261208

## 2011-04-04 LAB — CBC
Platelets: 235 10*3/uL (ref 150–400)
RBC: 3.78 MIL/uL — ABNORMAL LOW (ref 3.87–5.11)
RDW: 13.7 % (ref 11.5–15.5)
WBC: 9.9 10*3/uL (ref 4.0–10.5)

## 2011-04-06 LAB — PROTIME-INR
INR: 1.32 (ref 0.00–1.49)
Prothrombin Time: 16.6 seconds — ABNORMAL HIGH (ref 11.6–15.2)

## 2011-04-07 LAB — PROTIME-INR: Prothrombin Time: 19.2 seconds — ABNORMAL HIGH (ref 11.6–15.2)

## 2011-04-08 LAB — PROTIME-INR
INR: 2.18 — ABNORMAL HIGH (ref 0.00–1.49)
Prothrombin Time: 24.4 seconds — ABNORMAL HIGH (ref 11.6–15.2)

## 2011-04-22 NOTE — Consult Note (Signed)
NAME:  Monica [name], Monica [name] NO.:  [id number]   MEDICAL RECORD NO.:  [id number]          PATIENT TYPE:  INP   LOCATION:  1517                         FACILITY:  [facility] [place]   PHYSICIAN:  Marlan Palau, M.D.  DATE OF BIRTH:  March 25, 1932   DATE OF CONSULTATION:  01/26/2009  DATE OF DISCHARGE:                                 CONSULTATION   HISTORY OF PRESENT ILLNESS:  Monica [name] is a 77 year old right-handed  black female born [birth year] with a history of gait disorder seen  previously by Dr. Avie Echevaria through our office.  This patient was felt  to possibly have normal pressure hydrocephalus but it did not respond to  a lumbar puncture with improvement of gait.  A VP shunt therefore was  not placed.  This patient has been admitted from to the hospital with  some alteration in mental status, increased lethargy.  The patient  herself claims that she may have taken more medication than she should  have at home.  Patient has improved since admission and no longer at  this point is lethargic or encephalopathic.  The patient however, likely  has underlying dementia.  Neurology is asked see this patient for  further evaluation.  CT scan of the brain done on admission does show  evidence of ventriculomegaly but no acute changes were seen.  Significant lower extremity weakness is noted by clinical examination.   PAST MEDICAL HISTORY:  1. History of altered mental status on this admission.  2. Organic brain syndrome.  3. Obesity.  4. Hysterectomy.  5. Chronic renal insufficiency.  6. Gait disorder, possible normal pressure hydrocephalus.  7. History of bilateral meralgia paresthetica.  8. History of bilateral foot surgery.  9. Depression/anxiety.  10.Urinary incontinence.  11.Cervical spondylosis.  12.Benign essential tremor.  13.Hypertension.  14.Dyslipidemia.  15.Gastroesophageal reflux disease.  16.Right rotator cuff surgery.   ALLERGIES:  The patient has allergy to  codeine and aspirin.  Does not  smoke or drink.   CURRENT MEDICATIONS:  1. Aspirin 81 mg daily.  2. Premarin 0.65 mg daily.  3. Aricept 10 mg daily.  4. Protonix 80 mg daily.   SOCIAL HISTORY:  The patient lives in the [place] area.  The patient  is widowed, has five children, two have passed away, one with a gunshot  wound, one due to AIDS infection.  The patient is retired.   FAMILY MEDICAL HISTORY:  Notable that mother died with alcoholism.  Father died of old age at age 27.  The patient has no brothers or  sisters.   REVIEW OF SYSTEMS:  Notable for some occasional chills in the last 4  days.  Does note some headache on occasion, and feels that when she  swallows food gets stuck in the mid chest area.  Does not vomit.  The  patient does note some orthopnea.  Denies chest pains, has occasional  nausea.  The patient has incontinence of bladder.  The patient claims  that she has been walking fairly well with a cane, has not had any  recent falls.  The patient does note some numbness in both thighs.  Denies any new weakness.  Denies any blackout episodes or dizziness.   PHYSICAL EXAMINATION:  VITALS:  Blood pressure is 134/80, heart rate is  86, respiratory rate 19, temperature afebrile.  GENERAL:  This patient is a moderately obese black female who is alert  and cooperative at the time examination.  HEENT:  Head is atraumatic.  Eyes:  Pupils are round, react to light.  Disks are flat bilaterally.  NECK:  Supple.  No carotid bruits noted.  RESPIRATORY:  Examination is clear.  CARDIOVASCULAR:  Reveals a regular rate and rhythm.  No obvious murmurs  or rubs noted.  EXTREMITIES:  Without significant edema in the arms, but there is 2+  edema at the ankles bilaterally.  NEUROLOGICAL:  Cranial nerves as above.  Facial symmetry is present.  Patient has good pinprick sensation on the face bilaterally.  Has good  strength in the facial muscles and the muscles of head turning and   shoulder shrug bilaterally.  Speech is well enunciated, not aphasic.  Extraocular movements are full.  Visual fields are full.  Motor testing  reveals good strength in both upper extremities.  The patient is not  able to lift either leg very well off the bed with hip flexion, but has  good distal strength in the legs.  Good adduction, abduction, strength  in both legs.  The patient has good finger-nose-finger, is not able  perform heel-to-shin very well on either side.  The patient was not  ambulated.  Deep tendon reflexes depressed but symmetric.  Toes are  neutral bilaterally.  Pinprick, soft touch, vibratory sensation are  intact on all fours.   LABORATORY VALUES:  Notable for white count of 8.1, hemoglobin of 11.4,  hematocrit 34.5, MCV of 89.5, platelets of 167, sodium 139, potassium  3.9, chloride of 108, CO2 24, glucose of 108, BUN 22, creatinine 2.32,  total bili 0.8, alk phosphatase of 69, SGOT of 17, SGPT of 9, total  protein 6.2, albumin 3.1, calcium 8.4, cholesterol 171, triglycerides  276, HDL of 39, VLDL of 55.  Urinalysis reveals specific gravity of  1.012, pH of 5.5, 30 mg/dL protein, otherwise unremarkable.   Again CT scan of the head is as above.   IMPRESSION:  1. History of altered mental status that is now improving.  2. Gait disorder.  3. Organic brain syndrome.  4. Urinary incontinence with possible normal pressure hydrocephalus.   This patient does have evidence of dementia.  The patient is oriented to  person, place, does not know the month.  Does know the year.  The  patient is not able spell the word world backwards, can do serial 7's  only down to 93.  The patient recalls one-word of 3 in 5 minutes.  The  patient's examination shows what appears be fairly significant proximal  lower extremity weakness.  The cause of this is not clear.  The patient  claims she has been ambulating fairly well with a cane at home.   PLAN:  1. Will get physical therapy for  gait and leg strengthening exercises.  2. Check blood work to include TSH, B12 level, RPR, CK level.  Will      follow the patient's clinical course while in-house      C. Lesia Sago, M.D.  Electronically Signed     CKW/MEDQ  D:  01/26/2009  T:  01/26/2009  Job:  [id number]   cc:   [id number] [facility]

## 2011-04-22 NOTE — Consult Note (Signed)
Monica [name], Monica [name]                  ACCOUNT NO.:  [id number]   MEDICAL RECORD NO.:  [id number]          PATIENT TYPE:  INP   LOCATION:  1517                         FACILITY:  [facility]   PHYSICIAN:  Antonietta Breach, M.D.  DATE OF BIRTH:  1932-07-02   DATE OF CONSULTATION:  02/26/2009  DATE OF DISCHARGE:  01/29/2009                                 CONSULTATION   REASON FOR CONSULTATION:  Depression and memory problems.   HISTORY OF PRESENT ILLNESS:  Monica [name] is a 77 year old female admitted  to [facility] on January 25, 2009, due to altered mental  status and dehydration.   She has been experiencing profound decreased energy for a number of  weeks, approximately four weeks.  She does have intact interests and  constructive future goals.  She is more alert now that she has been  rehydrated.   She has experienced a number of tragedies recently including the death  of her husband which was last year.  This was her second husband.   PAST PSYCHIATRIC HISTORY:  Monica [name] does have a history on Celexa.  She  was diagnosed with major depression in 11/10/2009when her grandchild  died, and she was placed on Celexa then.  She has also been placed on  __________.   FAMILY PSYCHIATRIC HISTORY:  None known.   SOCIAL HISTORY:  She has been living with her daughter.  Her number of  children was five until one of them was shot and one of them died from  AIDS.  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  She is now  medically retired.  Her second husband passed away.  Please see above.  She does not use any alcohol or illegal drugs.   PAST MEDICAL HISTORY:  1. Chronic renal insufficiency.  2. Hypertension.  3. Gastroesophageal reflux disease.  4. Rotator cuff surgery.  5. She does have a history of hysterectomy.   MEDICATIONS:  Aricept.   ALLERGIES:  1. CODEINE.  2. ASPIRIN.   LABORATORY DATA:  Sodium 139, BUN 14, creatinine 2.31, glucose 108.  WBC  8.2, hemoglobin  12.7, platelet count 189,000.  SGOT 16, SGPT 12.  Head  CT without contrast showed portable atrophy.   RPR, B12 and TSH were normal.   REVIEW OF SYSTEMS:  Constitutional, HEENT, neurologic, psychiatric,  cardiovascular, respiratory, gastrointestinal, genitourinary, skin,  musculoskeletal, hematologic and lymphatic were all unremarkable.   PHYSICAL EXAMINATION:  VITAL SIGNS:  Temperature 98.7, pulse 97,  respirations 18, blood pressure 144/84.  GENERAL APPEARANCE:  Monica [name] is an elderly female lying in a supine  position in her hospital bed with no abnormal involuntary movements.  MENTAL STATUS EXAM:  Monica [name] has a mild decrease in alertness.  Her  concentration is mildly decreased.  Her affect is broad and appropriate.  Her mood is mildly depressed.  On orientation testing, she is completely  oriented to all spheres.  Memory testing is 3/3 words immediate and 2/3  on recall.  Fund of knowledge and intelligence is slightly below that of  her  estimated premorbid baseline.  Her judgment is intact.  She is  functioning cooperatively in the hospital.  No thoughts of harming  herself or others.  No delusions or hallucinations.  Insight is partial  judgment and is intact.   Monica [name] is psychiatrically cleared for discharge.   ASSESSMENT:  AXIS I:  293.83 -- Mood disorder, not otherwise specified (idiopathic and general  medical factors), depressed.  296.3 -- Major depressive disorder, not otherwise specified, likely  major depression with some residual symptoms of decreased energy.   RECOMMENDATIONS:  1. Would continue the Aricept trial with caution about loose stools as      a cholinergic side effect.  Would also continue Celexa starting at      10 mg p.o. q. a.m. and then increasing to 20 mg as tolerated q.      a.m. within the week.  2. Would ask the social worker to set Monica [name] up with outpatient      psychiatric followup during the first week.  Clinics are available      at  [facility], [facility] or [facility].      Antonietta Breach, M.D.  Electronically Signed     JW/MEDQ  D:  02/26/2009  T:  02/26/2009  Job:  [id number]

## 2011-04-23 NOTE — Discharge Summary (Signed)
  [name], [name]                  ACCOUNT NO.:  [id number]  MEDICAL RECORD NO.:  [id number]           PATIENT TYPE:  I  LOCATION:  5036                         FACILITY:  [facility]  PHYSICIAN:  Monica [name], M.D.   DATE OF BIRTH:  August 08, 1932  DATE OF ADMISSION:  03/30/2011 DATE OF DISCHARGE:  04/07/2011                              DISCHARGE SUMMARY   DISCUSSION:  The patient was initially planned for a short-term nursing home stay for rehabilitation.  She and her family decided she was not willing to receive rehab at a skilled nursing facility and therefore will be going home.  The patient has been unable to participate with physical therapy.  She is +2 maximum assistance for bed to chair transfers.  Despite this, the daughter does feel that the patient can be cared for at home and will have 24-hour assistance at home.  At the time of discharge, the patient was afebrile and vital signs were stable.  INR was 1.6.  The patient was placed on Coumadin for DVT prophylaxis and will remain on this for a total of 4-6 weeks postoperatively due to her immobilization.  The patient's splint was removed and her wounds were healing without drainage.  She had much less edema of the foot and ankle on the right.  She was instructed in keeping her splint dry and clean.  PLAN:  The patient will be discharged home.  She will keep her splint dry and clean and elevate her leg at rest.  Nonweightbearing to the right lower extremity for ambulation and transfers.  Home health physical therapy, occupational therapy, bath aide, durable medical equipment, and RN for Coumadin management will be provided.  The patient will follow up with Dr. Otelia Sergeant in approximately 1 week.  FINAL DIAGNOSES: 1. Right distal tibia and fibula fracture. 2. Chronic kidney disease. 3. Acute on chronic anemia.  CONDITION ON DISCHARGE:  Stable.     Monica [name], [name]   ______________________________ Monica [name],  M.D.    SMV/MEDQ  D:  04/07/2011  T:  04/08/2011  Job:  [id number]  Electronically Signed by Monica Mai. on 04/16/2011 03:54:23 PM Electronically Signed by Monica Browns M.D. on 04/23/2011 06:00:33 PM

## 2011-04-23 NOTE — Op Note (Signed)
NAME:  Monica [name], Monica [name]                  ACCOUNT NO.:  [id number]  MEDICAL RECORD NO.:  [id number]           PATIENT TYPE:  I  LOCATION:  5036                         FACILITY:  [facility]  PHYSICIAN:  Kerrin Champagne, M.D.   DATE OF BIRTH:  16-May-1932  DATE OF PROCEDURE:  04/01/2011 DATE OF DISCHARGE:                              OPERATIVE REPORT   PREOPERATIVE DIAGNOSIS:  Right lower extremity pilon fracture involving the distal tibia and the distal fibula approximately 4 inches above the ankle joint with intraarticular extension of the distal tibia fracture, nondisplaced.  POSTOPERATIVE DIAGNOSIS:  Right lower extremity pilon fracture involving the distal tibia and the distal fibula approximately 4 inches above the ankle joint with intraarticular extension of the distal tibia fracture, nondisplaced.  PROCEDURE:  Open reduction and internal fixation right distal tibia and fibula fractures using a 7 hole Synthes 3.5 DCP to the fibula and a 13 hole anatomic locking plate 3.5 mm to the medial distal tibia.  SURGEON:  Kerrin Champagne, MD  ASSISTANT:  None.  ANESTHESIA:  General via oral tracheal intubation, Dr. Loistine Simas.  ESTIMATED BLOOD LOSS:  150 mL.  DRAINS:  The patient had Foley straight drained at the end of the case. Total tourniquet time at 350 mmHg was 2 hours and 31 minutes.  BRIEF CLINICAL HISTORY:  The patient is a 77 year old female who reportedly was praying on March 29, 2011, and went to stand up that evening at 9:30 p.m. and the patient twisted her ankle and felt a pop, an immediate pain discomfort in her right leg.  Unable to stand or ambulate.  She lives alone.  Her son found her the following day.  At about 3:00 p.m., she was transported to [facility] Emergency Room.  At the Emergency Room she had an initial splinting of the lower extremity in the emergency room after x-rays demonstrated the distal tibia-fibula fracture.  She was admitted and a medicine consult  was obtained with her primary care physician Dr. Leretha Dykes.  She has some very mild renal insufficiency, some very mild dementia.  She underwent preoperative CT scan which demonstrated intraarticular distal tibia fracture, some comminution of the anterior cortex of the tibia distally just above the ankle joint but overall the fragments appeared to be primarily comminuted and extraarticular.  She is felt to be a good candidate for subcutaneous plate applied to the medial distal tibia and plate fixation of the fibula fracture site which was comminuted as well.  She understands risks and benefits of procedure.  Family does as well.  Plan is to perform procedure in order to allow her to be a short-leg cast for the duration of her immobilization.  The patient understands risks and benefits, signed informed consent.  DESCRIPTION OF PROCEDURE:  This patient was seen in the preoperative holding area, had marking of her right great toe with an X in my initial as well as above the patient's right short-leg splint.  She was seen in the preoperative holding area, had standard preoperative antibiotics, was transported then to the OR, OR room 15 was used at [facility].  There she underwent an induction of general anesthetic after first being transferred to the OR table.  This was done uneventfully and she was intubated.  Bump under the right buttock, right lower extremity was prepped from the toes to the ankle with DuraPrep solution, draped in the usual manner.  A regular size C-arm fluoro was used for this procedure.  The patient then had evaluation of the leg marking out, expected incision lines for the fibula and then for the patient's tibia side as well.  The patient's initial fixation was performed for the fibula as were waiting for the plates that were being delivered from [facility]  Elevation of the right lower extremity, exsanguination with Esmarch bandage, tourniquet  inflated initially to 300 mmHg then raised to 350 after venous bleeding encountered as the patient showed some tendencies to hypertension.  Incision over the expected fracture site for the fibula approximately 10 cm in length through the skin and subcutaneous layers directly over the subcu aspect of the fibula fracture site and the interval between the anterior and lateral compartments carefully spread down the bone.  Subperiosteal dissection carried laterally and anteriorly and the fragments of bone that represented a small butterfly fragment carefully, maintained their periosteal attachments to prevent devascularalization.  Major fracture fragments were able to be aligned and rotated into correct position alignment, held in place with lobster claw bone holding forceps.  A 7 hole DCP was chosen.  This was a standard plate allowing for locking if necessary or for lag fixation.  The plate was carefully contoured to the distal fibula, then placed against the fibula fracture site.  Lobster claw bone holding forceps used to hold the bone fragments distal and proximal in correct position alignment, reduced to the plate.  Drill holes were then placed both proximally distal to the fracture site using 3.5 cortical screws and using DCP action of the plate to reduce and compress the fracture site.  This was done in nearly anatomic position alignment.  Total of 3 holes placed distally and 3 holes placed proximally and single hole over the fracture site was left open as the fracture was extremely comminuted here.  Butterfly fragment posteriorly located at the fracture site, was carefully reduced into position alignment after reduction of the previous fragments.  Then a single cerclage of #1 Ethibond was used to hold the fragment in place. Irrigation was carried out of this site and then the deep subcu layers were approximated with interrupted 0 Vicryl sutures, more superficial layers with interrupted  2-0 Vicryl sutures and the skin closed with stainless steel staples.  Attention then turned to the distal tibia fracture site after observation on C-arm fluoro.  The fibula was fixed in anatomic position alignment.  Over the medial portion of the tibia after receiving plates, plates were carefully placed medial to the medial distal tibia and a correct plate was chosen and this was longer of the 2 distal medial plates about 13 hole plate with 6 holes proximally and locking holes distally.  There were locking holes proximally located as well.  This longer plate was chosen as best plate for fixation that would bridge the fracture, allow for at least 6-8 cortices proximally.  With this held in place, an incision was then made along the medial aspect of the prominence of the tibia at its curve and continued proximally over about 5 cm.  Veins were located beneath the skin and these represented venous plexus.  They were carefully cauterized  and the incision carried sharply down to the superficial tibia here.  A Cobb elevator was then used to elevate periosteum proximally along the medial aspect of the tibia finding the planes of skin and subcutaneous to the fatty layers and the patient's saphenous nerve and vein here.  Plate was then passed proximally and a single pin used to fix plate distally.  Percutaneous pin was then used to fix the plate proximally to the tibia observing in the AP and lateral plane, the plate to be in line with the tibia itself.  After carefully aligning, then the first screw placed was placed distal using a regular drill for a locking type of screw but then placing a cortical screw fixing the plate distally.  With this then fixed carefully aligning the proximal aspect of plate to the tibia, then 3 distal locking screws were then placed locking the distal tibia to the plate quite nicely.  Additional lag screws were then placed fixing the patient's oblique portion of  the distal fracture site and lagging fixation here in order to approximate the fracture site nicely.  Once lagging was completed, then a combination of lag screws and bicortical screws were used to fix the proximal tibia fragments then to the plate.  These were done percutaneously through 3 stab incisions about a centimeter each spreading the subcu layers down to the plate using soft tissue sleeve protector for the 2.5 drill bit measuring their length and placing appropriate screws.  At least 2 locking screws were placed in the proximal 6 screw holes.  This completed fixation of the plate to the tibia site.  At that point both the distal and proximal pins were removed.  The patient then had the initial screw that was used for fixation of plate removed and replaced with a locking screw after first replacing the locking screw, drill sleeve drilling the appropriate, measuring the length and then placing the appropriate locking screw. This was then completion of the procedure.  Irrigation was carried out. Permanent C-arm images obtained in AP and lateral planes demonstrated the reduction of both fibula and tibia in excellent position alignment. Following irrigation then each of the stab incisions proximally were then approximated with interrupted 2-0 Vicryl sutures.  The skin over the medial aspect of the distal tibia was approximated, approximatedthe subcu layers with interrupted 0 and 2-0 Vicryl suture and the skin closed with stainless steel staples.  The subcu area was closed with 2- 0, approximated the 3 stab incisions and then the skin were closed with stainless steel staples also.  The patient then had Adaptic applied, 4x4s, ABD pads and a well-padded posterior short-leg cast was applied. Tourniquet was released.  Total tourniquet time was 2 hours and 30 minutes.  Permanent images were obtained in AP and lateral planes documenting the fracture fixation.  The patient was then  reactivated, extubated, returned to the recovery room in satisfactory condition.  All instrument and sponge counts were correct.     Kerrin Champagne, M.D.     JEN/MEDQ  D:  04/04/2011  T:  04/05/2011  Job:  [id number]  Electronically Signed by Vira Browns M.D. on 04/23/2011 06:01:07 PM

## 2011-04-23 NOTE — Discharge Summary (Signed)
  [name], Monica [name]                  ACCOUNT NO.:  [id number]  MEDICAL RECORD NO.:  [id number]           PATIENT TYPE:  LOCATION:                                 FACILITY:  PHYSICIAN:  Kerrin Champagne, M.D.   DATE OF BIRTH:  1932-07-29  DATE OF ADMISSION: DATE OF DISCHARGE:                              DISCHARGE SUMMARY   ADDENDUM  CONSULTING PHYSICIAN:  Dr. Jeri Cos.     [name], [name]   ______________________________ Kerrin Champagne, M.D.    SMV/MEDQ  D:  04/07/2011  T:  04/08/2011  Job:  [id number]  cc:   Jarome Matin, MD  Electronically Signed by Dorna Mai. on 04/16/2011 03:55:10 PM Electronically Signed by Vira Browns M.D. on 04/23/2011 06:01:02 PM

## 2011-04-23 NOTE — Discharge Summary (Signed)
  Monica [name], VANBERGEN                  ACCOUNT NO.:  [id number]  MEDICAL RECORD NO.:  [id number]           PATIENT TYPE:  I  LOCATION:  5036                         FACILITY:  [facility]  PHYSICIAN:  Kerrin Champagne, M.D.   DATE OF BIRTH:  30-Oct-1932  DATE OF ADMISSION:  03/30/2011 DATE OF DISCHARGE:  04/08/2011                              DISCHARGE SUMMARY   ADDENDUM: Admission diagnoses and discharge diagnoses same as previously dictated discharge summary.  PLAN:  Initially, the patient was felt to need nursing home placement, but the family refused stating that they would take care of the patient at home 24 hours a day.  Unfortunately, when she was discharged from the hospital yesterday, the family would not come to the hospital to pick the patient up and take her home.  Case management became involved again and the patient is now set for nursing home placement for continuation of rehabilitation following her injury.  The patient's medication list remains the same as the previous dictated summary on April 04, 2011. She will be on Coumadin for DVT prophylaxis.  Adjustments in her Coumadin dose should be made according to pro-time by the pharmacist at the nursing facility.  The patient's last INR on Apr 08, 2011, was 2.18.  FINAL DIAGNOSES: 1. Right distal tibia and fibula fracture. 2. Hypertension. 3. Chronic kidney disease, stage IV. 4. History of idiopathic normal-pressure hydrocephalus. 5. History of chronic brain syndrome. 6. History of urinary incontinence.  The patient will follow up with Dr. Otelia Sergeant in 2 weeks from the date of her surgery.  CONDITION ON DISCHARGE:  Stable.     [name], [name]   ______________________________ Kerrin Champagne, M.D.   SMV/MEDQ  D:  04/08/2011  T:  04/08/2011  Job:  [id number]  cc:   Dr. Leretha Dykes  Electronically Signed by Dorna Mai. on 04/16/2011 03:54:57 PM Electronically Signed by Vira Browns M.D. on 04/23/2011 06:00:57  PM

## 2011-04-23 NOTE — Discharge Summary (Signed)
Monica [name], Monica [name]                  ACCOUNT NO.:  [id number]  MEDICAL RECORD NO.:  [id number]           PATIENT TYPE:  I  LOCATION:  5036                         FACILITY:  [facility]  PHYSICIAN:  Kerrin Champagne, M.D.   DATE OF BIRTH:  [birth year]  DATE OF ADMISSION:  03/30/2011 DATE OF DISCHARGE:                              DISCHARGE SUMMARY   DATE OF DISCHARGE:  Pending.  ADMISSION DIAGNOSES: 1. Right distal tibia and fibular fracture/pilon fracture. 2. Hypertension. 3. Chronic kidney disease stage IV. 4. History of idiopathic normal pressure hydrocephalous. 5. History of chronic brain syndrome. 6. History of urinary incontinence.  DISCHARGE DIAGNOSES: 1. Right distal tibia and fibular fracture/pilon fracture. 2. Hypertension 3. Chronic kidney disease stage IV. 4. History of idiopathic normal pressure hydrocephalous. 5. History of chronic brain syndrome. 6. History of urinary incontinence.  PROCEDURES:  On April 02, 2011, the patient underwent open reduction and internal fixation right distal tibia and fibula fracture, performed by Dr. Otelia Sergeant under general anesthesia.  CONSULTATIONS:  Dr. Thelma Barge of Medical Team.  BRIEF HISTORY:  The patient is a 77 year old female who fell when she was rising from a kneeling position.  She twisted the right ankle and felt a pop and had severe pain.  She was unable to ambulate.  She was brought to [facility] Emergency Room where radiographs were obtained. This did show a right distal tibia and fibula fracture with comminution and intraarticular component.  It was felt that she would require surgical intervention.  However, due to the fact that she had extreme edema of the right ankle and was at risk for skin sloughing, she was placed at bedrest with strict elevation and ice to decrease the edema prior to presenting for surgical intervention.  BRIEF HOSPITAL COURSE:  The patient was placed at bedrest, elevation of the right lower extremity.   CT scan was performed of the right ankle and did show intraarticular extension of the fracture.  She was seen by her primary care physician and felt to be stable for surgical intervention. She was taken to the OR on April 01, 2011, for the above-stated procedure.  Postoperatively, on the first day, she was reluctant to move the toes of the right foot.  She was having considerable discomfort in the right ankle and foot.  Her splint was split and her compartments were noted to be soft.  She was able to move her toes slightly.  The patient was started on physical therapy and required full assistance for bed to chair transfer.  She was not able to tolerate the nonweightbearing status to the right lower extremity.  It was felt that she would benefit from a nursing home placement as she did not have assistance at home and was not making gains towards independence during the hospital stay.  The case manager discussed this with her family and she felt that her mother would need nursing home placement as well.  The case manager assisted in finding a suitable place for rehabilitation. The patient continued with physical therapy, however, complained of fatigue and was very slow to progress.  She  required narcotic analgesics initially, however, due to increasing confusion, her narcotics were minimized and Tylenol was utilized.  Vicodin was utilized for severe pain, but she did not require much in way of this medication.  The patient was able to void after her Foley catheter was discontinued, but she did have urinary incontinence.  Postoperative hemoglobin and hematocrit dropped the lowest value of 10.3 and 30.5.  BUN and creatinine were monitored throughout the hospital stay.  At the time of this dictation, BUN 25, creatinine 2.56.  On admission, BUN was 22 and creatinine 2.43.  Urinalysis obtained on the April 03, 2011, was negative for urinary tract infection and her urine culture was with  no growth.  Preoperative Staphylococcus aureus screening was positive for methicillin-sensitive staph, however, negative for methicillin-resistant staph.  It was felt the patient was stable for transfer to a nursing facility.  At the time of this dictation, placement is pending bed offers.  PLAN:  Once she is discharged to the facility, she should continue to receive physical therapy on a daily basis for bed to chair and bed to wheelchair transfers, nonweightbearing to the right lower extremity. Once she is able to maintain the nonweightbearing status, she may begin with ambulation training.  She will be nonweightbearing for a minimum of 6 weeks.  Follow ups will be done in Dr. Barbaraann Faster office for repeat x- rays and adjustments in her weightbearing status will be made according to these findings in the office.  Her first visit will be in 2 weeks at which time her staples will be removed from her wounds.  She will continue in her posterior splint until that time.  She should continue with strict elevation and ice when at rest.  She is encouraged to move her toes.  Her splint should be kept dry and clean at all times.  MEDICATIONS:  The patient's medications currently include, 1. Aricept 10 mg p.o. at bedtime. 2. Premarin 0.625 mg daily. 3. Colace one p.o. b.i.d. 4. Vicodin 1 to 2 every 4-6 hours as needed for pain. 5. Acetaminophen 650 mg one every 4-6 hours as needed for pain. 6. She is on Lovenox during the hospital stay. 7. As she is at fall risk, Coumadin will not be utilized and once she     is discharged to the nursing facility, she should continued to be     on enteric-coated aspirin 325 mg one p.o. b.i.d.     [name], [name]   ______________________________ Kerrin Champagne, M.D.    SMV/MEDQ  D:  04/04/2011  T:  04/04/2011  Job:  [id number]  cc:   Kerrin Champagne, M.D.  Electronically Signed by Dorna Mai. on 04/16/2011 03:54:03 PM Electronically Signed by  Vira Browns M.D. on 04/23/2011 06:00:25 PM

## 2011-04-25 NOTE — Discharge Summary (Signed)
Monica [name], PENTLAND                  ACCOUNT NO.:  [id number]   MEDICAL RECORD NO.:  [id number]          PATIENT TYPE:  INP   LOCATION:  1517                         FACILITY:  [facility]   PHYSICIAN:  Jarome Matin, M.D.DATE OF BIRTH:  Jul 16, 1932   DATE OF ADMISSION:  01/25/2009  DATE OF DISCHARGE:  01/29/2009                               DISCHARGE SUMMARY   ADMITTING DIAGNOSIS:  Altered mental status.   DISCHARGE DIAGNOSES:  1. Idiopathic normal pressure hydrocephalus.  2. Altered mental status.  3. Chronic brain syndrome.  4. Abnormal gait.  5. Hypertensive disease.  6. Chronic renal disease, moderate.  7. Musculoskeletal weakness.  8. Dehydration.  9. Urinary tract incontinence.  10.Obesity.  11.Cervical spondylosis.  12.Hyperlipidemia.   BRIEF HISTORY AND HOSPITAL COURSE:  This is an admission for Ms. Monica [name], a 77 year old [place] American female, who has been gradually  getting more and more demented, who has been living alone.  I believe  that she has been getting her meds mixed up, and her daughter states  that she takes her own meds.  The patient has a history of hypertension,  arthritis, and gradually worsening dementia.  Since losing her husband  several years ago she has been getting more and more demented.  She had  a hysterectomy.  She also had some severe gastric emptying delay and  reflux.  She had been fairly stable on Aricept.  Dementia has been  gradually getting worse.  Temp is 98.1, pulse of 88, respirations 18,  blood pressure 136/83, O2 sats 99% on 3 L.  The patient is very sleepy,  but she did know my name.  Chest was clear to auscultation and  percussion.  She had a regular sinus rhythm.  Abdomen was soft, active  bowel sounds, no mass, no organomegaly.  She could move all of her  extremities.  She denies any pain.  She appears to have taken a bit more  of her meds than she should have.  I believe that she gets her meds  mixed up at home.  She  needs someone to help her with those.  I am  starting IV D-5 normal saline at 40 mL an hour.  Continue Aricept daily.  Continue on aspirin 81 mg daily.  Will hold off on her other medicines,  and gradually add them as it becomes necessary and she becomes more  responsive.  She has a gradually worsening neuropathy over the last few  years.  She has had some gradual decrease in her renal function over the  past few years.   SUMMARY OF HER LABORATORIES:  EKG at a normal sinus rhythm.  She has an  old inferior infarct age undetermined.  Trace bilirubin in her urine.  RPR is nonreactive.  TSH was normal at 1.107.  Triglycerides were  elevated at 256.  HDL very low at 30.  LDL was normal.  Urine on  admission was 23 with creatinine 2.42, gradually with hydration down to  2.13.  Hemoglobin 13 with hydration down to 12.7.  Hematocrit 39.  Sed  rate was a little bit elevated at 36.   __________ and continued to hydrate the patient.  She gradually became a  bit more active, moving about.  Got a neurological evaluation.  The  patient has some dementia, but with hydration she gradually improved.  Was feeling better and talking better.  Her daughter came to visit.  She  at first was not talking.  She did not seem to want her daughter living  in her house.  There is some question about her going to a nursing home  as she became more hydrated.  She remained stable.  She thought her  daughter had been taking her money.  She __________ say that she really  needs her daughter.  We started giving her chopped foods.  The patient  thinks she can eat chopped foods much better.  Giving her some Protonix  and a small dose of Reglan, but I did not want to give too much.  In  fact, we may not even use Reglan because it may cause some neurological  changes.  We are going to send her home with some home health, physical  therapy.  York Spaniel the daughter is going to initially stay with her, and  home health will help  and observe how she is being treated at home.  On  the last day she was sitting up and eating.  She was alert and oriented  x3, and she has decided to go home with her daughter and to continue to  live with her.  She said she can get her son to help her with her  daughter, and we will go ahead and discharge her home and not to a  nursing home.  The patient was stable, vitals were stable, chest was  clear.  Creatinine was 2.13, creatinine clearance about 30 mL per  minute, hemoglobin about 12.7.  We are discharging her on her home meds  with her daughter.  An [facility] nurse will check the home.  Will get PT and OT at home and she is going to work out a situation with  her daughter, and will see her back in the office in about 2 weeks after  discharge.   DISCHARGE MEDICATIONS:  1. Aricept 10 mg daily.  2. Premarin 0.625 mg daily.  3. Baby aspirin 81 mg daily.  4. Nexium 40 mg daily.  5. Tylenol 1 or 2 every 6-8 hours as needed for pain.  6. Some Coreg 3.15 mg twice a day.  7. We are going to hold her other meds until she comes to the office,      and then we will restart what we think she needs gradually.           ______________________________  Jarome Matin, M.D.     CEF/MEDQ  D:  02/20/2009  T:  02/21/2009  Job:  [id number]

## 2011-04-25 NOTE — Consult Note (Signed)
[place]  Patient:    Monica [name]                          MRN: [id number] Proc. Date: 10/21/99 Adm. Date:  [id number] Attending:  Sonda Primes CC:         Mardene Celeste. Lurene Shadow, M.D.             Jarome Matin, M.D.                          Consultation Report  REASON FOR CONSULTATION:  I was asked by Dr. Bascom Levels to see this 77 year old, right-handed, African-American, married woman who has a chief complaint of legs  hurting.  HISTORY OF PRESENT ILLNESS:  The patient has had a history since September of pain in both thighs, both on the anterior and lateral aspect of her legs.  The pain oes not radiate and has a burning, tingling quality.  The patients symptoms are exacerbated by standing or walking and relieved to some extent by sitting; however, the pain has become more continuous recently and is present even she is lying or when she rolls over.  There is even pain when she has clothing resting on the legs, so she keeps her gown rolled slightly up.  The patient is very sensitive to tactile and cold stimuli on her legs and also heat.  The patient has had some jerking movements of the left foot during sleep which as present previously and has not worsened.  PAST MEDICAL HISTORY:  Remarkable for hypertension, hyperlipidemia, and gastroesophageal reflux disease.  The patient categorically denies diabetes.  REVIEW OF SYSTEMS:  Remarkable for abdominal discomfort which was thought to be  related to a small-bowel obstruction.  This has been relieved by NG tube suctioning.  The patient has now been able to tolerate liquid nourishment.  The  patient has had bowel movements documented in the chart.  She has significant obesity.  Review of systems is unremarkable for constitutional fevers and chills or weight loss.  Cardiovascular/Pulmonary: No cough, chest pain, arrhythmias, or dyspnea.  Gastrointestinal: No constipation or  bloody stools, nausea, vomiting, or diarrhea. Musculoskeletal: No joint pain or arthritis.  Skin: No rash.  Hematologic: No anemia or bruisability.  Endocrine: No diabetes or thyroid disease. Neurologic: Negative except as noted above.  The patient was admitted by Dr. Dominga Ferry to rule out acute abdomen.  The patients symptoms were treated conservatively, and she responded well.  We were asked to see her because her symptoms have antedated this hospitalization.  PAST SURGICAL HISTORY:  Total abdominal hysterectomy March 1980.  Lysis of adhesions May 1980.  The patient had some form of foot operation in 1996 and has pins in her feet.  FAMILY HISTORY:  The patients mother died at age 39 of myocardial infarction.  The patients father is alive at age 65; he is diabetic.  There is also a history of hypertension, coronary artery disease, and substance abuse in her family; also AIDS.  SOCIAL HISTORY:  The patient had five children, one was shot, another died of AIDS, really of complications.  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  She used to drink alcohol to excess but quit in 1980.  She used to smoke cigarettes, a 60-pack-year history.  She quit in 1987.  She lives with her husband.  CURRENT MEDICATIONS: 1. Mylanta 2. 2. Phenergan 25  mg IV q.6h.  The patient was receiving morphine but is no longer.  ALLERGIES TO MEDICINES:  None.  Intolerances: ASPIRIN causes GI upset. CODEINE causes shakiness.  PHYSICAL EXAMINATION:  GENERAL:  This is a well-developed, morbidly obese woman in no distress.  VITAL SIGNS:  Blood pressure 147/75, resting pulse 74, respirations 20, temperature 99.2  HEENT:  No signs of infection.  NECK:  Supple, full range of motion.  No cranial or cervical bruits.  LUNGS:  Clear to auscultation.  HEART:  No murmur, pulses normal.  ABDOMEN:  Soft, nontender.  Bowel sounds normal.  EXTREMITIES:  Well formed without edema, cyanosis, alterations  in tone, or tight heel cords.  NEUROLOGIC:  Mental Status: The patient was awake, alert, attentive, appropriate. No dysphagia or dyspraxia.  Cranial Nerves:  Round, reactive pupils.  Fundi normal.  Visual fields full to double simultaneous stimuli.  Extraocular movements full and conjugate.  OKN responses equal bilaterally.  Symmetric facial strength and sensation.  Air conduction greater than bone conduction bilaterally.  Motor Examination: Normal strength, tone, and mass.  Good fine motor movements. No pronator drift.  The patient had positive straight leg raising to about 45 degrees but has pain in the back of her legs rather than in her back.  She had good figure 4 and internal and external rotation.  The patient does have tenderness when the interior aspects and lateral aspects f her legs are stroked and complains of burning and tingling pain.  Sensation does not show evidence of peripheral neuropathy in glove and stocking  distribution.  She has good sensation in her upper extremities and good sensation to the knees in her lower extremities.  She says that the tuning fork, which feels normal below her knees, feels like an ice block above her knees.  Deep tendon reflexes are symmetric and normal in the upper extremities, in lower extremities may be slightly diminished at the ankles but certainly present. The patient had bilateral flexor plantar responses.  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  This is not related to diabetes.  I do not believe that she has evidence of radiculopathy or spinal stenosis because of the excellent strength that she has, lack of back pain with positive straight leg raising, and the preserved reflexes.  In addition, I do not believe that she has a significant peripheral polyneuropathy because she does not have a distal numbness.  PLAN:  We will begin  Neurontin 100 mg 3 times a day and see how she tolerates it.  This can be increased unless she becomes dizzy, nauseated, or has double vision. She could be moved up to as much as 3000 mg a day in three divided doses, but I  would go up slowly and see how she tolerates it.  An MRI scan of the lumbosacral spine will likely show some spondylosis at L5-S1  where she has evidence of spondylosis on her simple spine films.  This could be  done to rule out any other structural abnormality.  I appreciate the opportunity to see her.  If you have questions or I can be of assistance, do not hesitate to contact me. DD:  10/21/99 TD:  10/21/99 Job: [id number]

## 2011-04-25 NOTE — Op Note (Signed)
NAME:  Monica [name], Monica [name]                            ACCOUNT NO.:  [id number]   MEDICAL RECORD NO.:  [id number]                   PATIENT TYPE:  AMB   LOCATION:  DSC                                  FACILITY:  [facility]   PHYSICIAN:  Feliberto Gottron. Turner Daniels, M.D.                DATE OF BIRTH:  12/07/1932   DATE OF PROCEDURE:  08/16/2003  DATE OF DISCHARGE:                                 OPERATIVE REPORT   PREOPERATIVE DIAGNOSES:  Right shoulder impingement syndrome, possible  rotator cuff tear and some degenerative arthritic changes.   POSTOPERATIVE DIAGNOSES:  Right shoulder impingement syndrome, possible  rotator cuff tear and some degenerative arthritic changes.   OPERATION PERFORMED:  Right shoulder arthroscopic anterior inferior  acromioplasty, distal clavicle spur excision, debridement of massive rotator  cuff tear of the supraspinatus insertion that was not repairable and  otherwise had poor quality tissue.  We also removed some chondromalacia of  the humeral head grade 3 focal as well as a superior labral tear.   SURGEON:  Feliberto Gottron. Turner Daniels, M.D.   ASSISTANTLaural Benes. Jannet Mantis.   ANESTHESIA:  General endotracheal.   ESTIMATED BLOOD LOSS:  Minimal.   FLUIDS REPLACED:  of crystalloid.   DRAINS:  None.   TOURNIQUET TIME:  None.   INDICATIONS FOR PROCEDURE:  The patient is a 77 year old woman injured at  work with right shoulder impingement syndrome, MRI scan consistent with  partial to full thickness rotator cuff tear and some degenerative arthritic  changes.  She has failed conservative treatment with anti-inflammatory  medicines, exercises, got only temporary relief from a cortisone shot and  now desires arthroscopic evaluation and treatment of her right shoulder.  Because of her age and other medical problems, she is not a good candidate  for any sort of rotator cuff repair.   DESCRIPTION OF PROCEDURE:  The patient was identified by arm band and taken  to the operating  room at [facility] where appropriate  anesthetic monitors are attached and general endotracheal anesthesia induced  with the patient in the supine position.  The patient was then placed in the  beach chair position.  Her upper extremity was prepped and draped in the  usual sterile fashion from the wrist to the hemithorax.  The skin along the  anterolateral and posterior aspects of the acromion process was infiltrated  with 2 to 3mL of 0.5% Marcaine with epinephrine solution.  Using a #11  blade, standard portals were then made 1.5 cm anterior to the  acromioclavicular joint, lateral to the junction of the middle and posterior  thirds of the acromion, posterior to the posterolateral corner of the  acromion process.  the inflow placed anteriorly, the arthroscope laterally  and 4.2 great white sucker shaver posteriorly.  We immediately identified  the subclavicular and subacromial spurs.  Small bleeders were identified and  cauterized with underwater electrocautery from the posterior portal and  having outlined the spurs, they were then removed with a 4.5 hooded Vortex  bur.  We then evaluated the rotator cuff.  It was quite friable along the  supraspinatus insertion and then after debriding the weak and degenerative  tissue, there was a massive rotator cuff tear that went back to the glenoid  rim and was not repairable.  A greater tuberosity tuberoplasty was then  performed to smooth out the greater tuberosity.  We also made sure there was  no lateral downsloping to the acromion.  Satisfied with the decompression,  we then evaluated the labrum using the defect in the rotator cuff for  visualization and found a degenerative tearing of the superior labrum which  was also debrided back to stable margin as was some chondromalacia of the  humeral head, grade 3.  Photographic documentation was made of the  pathology.  The shoulder was then washed out with normal saline solution  and  the arthroscopic instruments removed and a dressing of Xeroform, 4 x 4  dressing sponges and paper tape applied.  Patient awakened and taken to the  recovery room without difficulty.                                                  Feliberto Gottron. Turner Daniels, M.D.    [name]  D:  08/16/2003  T:  08/17/2003  Job:  [id number]

## 2011-04-25 NOTE — Discharge Summary (Signed)
NAME:  Monica [name], Monica [name]                            ACCOUNT NO.:  [id number]   MEDICAL RECORD NO.:  [id number]                   PATIENT TYPE:  INP   LOCATION:  5533                                 FACILITY:  [facility]   PHYSICIAN:  Jarome Matin, M.D.            DATE OF BIRTH:  Apr 26, 1932   DATE OF ADMISSION:  12/20/2003  DATE OF DISCHARGE:  12/26/2003                                 DISCHARGE SUMMARY   ADMISSION DIAGNOSIS:  Headache, feels hot and cold and probably infection of  the bladder.   DISCHARGE DIAGNOSES:  1. Bladder infection/sepsis.  2. Urine was growing Enterococcus and I think it was also growing some E     coli.  They are both found to be sensitive to Avelox.  Urine culture did     grow out some E coli sensitive to amoxicillin. Enterococcus was also     sensitive to amoxicillin, so the patient was switched from Avelox and     vancomycin to amoxicillin.   BRIEF HISTORY AND PHYSICAL:  She was brought to the emergency room by her  family.  She had fallen and hit her ribs.  She also had a headache.  She was  feeling hot and cold.  No chest pain. She had been having night sweats for  about a week.  She was having trouble holding her bladder.  The patient has  a history of hypertension and she was admitted several years ago with severe  constipation, thought to be a small bowel obstruction but she never had the  surgery.  It was corrected without surgery.  She had been fairly stable.  The patient had five children by a previous marriage.   PHYSICAL EXAMINATION:  VITAL SIGNS:  Temperature 102, pulse 115, blood  pressure 165/76.  It had dropped down to 121/46.  GENERAL:  Alert and oriented x3.  HEENT:  The patient is edentulous.  NECK:  Neck veins were flat.  Pulses bilaterally equal.  Neck supple.  There  was no stiffness.  CHEST:  Clear.  HEART:  Sinus tachycardia, no gallop or rubs.  ABDOMEN:  Soft.  No masses.  No organomegaly.  She had active bowel sounds.  She had  difficulty passing her urine.  EXTREMITIES:  She could move all her extremities.  She had bilateral leg  edema, trace.   LABORATORY DATA:  White count 19,000, hemoglobin 11.8.  Urine with some  bacteria but it was clear.  Source of fever at first was unclear but we  think that it was probably urosepsis and bladder infection.  LS spine films  were negative.   ALLERGIES:  No known drug allergies.   HOSPITAL COURSE:  We had started her on Avelox and vancomycin.  Her urine  was growing out Enterococcus and then was growing out some E coli.  The  Avelox was discontinued for amoxicillin 500 mg b.i.d.  We kept  her on that  and the cultures came back.  Creatinine at first was 2.1, uric acid 8.1.  Gradually her temperature came down to normal, pulse 88, blood pressure  130/62, respirations 20.  Barium enema was done and showed numerous  diverticula of the distal ascending colon.  Some few hemorrhoids were seen.  Otherwise the colon was normal.  She was a little stiff.  She had a history  of rotator cuff surgery on the right shoulder and we think we would like her  to see an orthopedist and urologist as an outpatient.  Her legs felt  stronger as we increased the potassium.  It was low on admission.  We  started her on Clinoril 200 mg p.o. daily, Zostrix cream for her right  shoulder.  We got the mobility team involved with the patient.  Her blood  pressure continued to ____________  on amoxicillin.  She did fine on that.  We continued her on K-Dur 10 mEq b.i.d., allopurinol 100 mg daily, Clinoril  200 mg daily, and Capzasin applied to the shoulder and also she could take  Tylenol p.r.n. and Imodium p.r.n. for diarrhea.  We will get home health to  help her at home.  Refer her to orthopedics for her right shoulder and to  Dr. Brunilda Payor for her urology problem.   She is to return to our office in two weeks.                                                Jarome Matin, M.D.    CEF/MEDQ  D:   01/15/2004  T:  01/15/2004  Job:  [id number]

## 2011-04-25 NOTE — Op Note (Signed)
NAMEVASILIA, DISE                  ACCOUNT NO.:  [id number]   MEDICAL RECORD NO.:  [id number]          PATIENT TYPE:  OUT   LOCATION:  MDC                          FACILITY:  [facility]   PHYSICIAN:  Genene Churn. Love, M.D.    DATE OF BIRTH:  [birth year]   DATE OF PROCEDURE:  10/07/2006  DATE OF DISCHARGE:                                 OPERATIVE REPORT   CLINICAL INFORMATION:  Ms. Hise has a history of gait disorder and urinary  incontinence.  She was initially evaluated September 10, 2006, with these  symptoms.  Evaluation has included CK, TSH, sedimentation rate, B12 and RPR.  The MRI study has shown evidence of enlargement of the ventricular system  without significant atrophy.  She is now being evaluated for her memory  loss, gait disorder and some urinary symptoms with a drainage procedure.   PROCEDURE NOTE:  The patient was prepped and draped in the left lateral  position using Betadine and 1% Xylocaine.  The L3-L4 interspace was entered  without difficulty.  Opening pressure was 200 mm of water but the patient  was slightly tense, 35 mL of CSF was removed over 7 minutes.  A stand-walk-  sit test with two chairs 20 feet apart was performed.  The timed gait  testing was 1 minute and 40 seconds before LP drainage procedure and 2  minutes and zero seconds after LP drainage procedure.   IMPRESSION:  Negative LP drainage procedure for normal pressure  hydrocephalus.  We will follow the patient over the next few days to see if  there is any improvement in walking.   PLAN:  Bedrest, push p.o. fluids, watch for headache and CSF for initial  screening studies with CSF to be held for other studies.  CSF screening  studies will be cell count, differential, VDRL protein and glucose.           ______________________________  Genene Churn. Sandria Manly, M.D.     JML/MEDQ  D:  10/07/2006  T:  10/07/2006  Job:  [id number]

## 2011-07-18 ENCOUNTER — Emergency Department (HOSPITAL_COMMUNITY): Payer: Medicare Other

## 2011-07-18 ENCOUNTER — Inpatient Hospital Stay (INDEPENDENT_AMBULATORY_CARE_PROVIDER_SITE_OTHER)
Admission: RE | Admit: 2011-07-18 | Discharge: 2011-07-18 | Disposition: A | Discharge status: Home / Self Care | Payer: Medicare Other | Source: Ambulatory Visit | Attending: Family Medicine | Admitting: Family Medicine

## 2011-07-18 ENCOUNTER — Inpatient Hospital Stay (HOSPITAL_COMMUNITY)
Admission: EM | Admit: 2011-07-18 | Discharge: 2011-07-20 | DRG: 689 | Disposition: A | Discharge status: Home Health | Payer: Medicare Other | Attending: Internal Medicine | Admitting: Internal Medicine

## 2011-07-18 ENCOUNTER — Encounter (HOSPITAL_COMMUNITY): Payer: Self-pay | Admitting: Radiology

## 2011-07-18 DIAGNOSIS — I498 Other specified cardiac arrhythmias: Secondary | ICD-10-CM | POA: Diagnosis present

## 2011-07-18 DIAGNOSIS — F29 Unspecified psychosis not due to a substance or known physiological condition: Secondary | ICD-10-CM

## 2011-07-18 DIAGNOSIS — Z66 Do not resuscitate: Secondary | ICD-10-CM | POA: Diagnosis present

## 2011-07-18 DIAGNOSIS — I129 Hypertensive chronic kidney disease with stage 1 through stage 4 chronic kidney disease, or unspecified chronic kidney disease: Secondary | ICD-10-CM | POA: Diagnosis present

## 2011-07-18 DIAGNOSIS — Z7901 Long term (current) use of anticoagulants: Secondary | ICD-10-CM

## 2011-07-18 DIAGNOSIS — A498 Other bacterial infections of unspecified site: Secondary | ICD-10-CM | POA: Diagnosis present

## 2011-07-18 DIAGNOSIS — M6281 Muscle weakness (generalized): Secondary | ICD-10-CM

## 2011-07-18 DIAGNOSIS — N390 Urinary tract infection, site not specified: Principal | ICD-10-CM | POA: Diagnosis present

## 2011-07-18 DIAGNOSIS — G9341 Metabolic encephalopathy: Secondary | ICD-10-CM | POA: Diagnosis present

## 2011-07-18 DIAGNOSIS — N039 Chronic nephritic syndrome with unspecified morphologic changes: Secondary | ICD-10-CM | POA: Diagnosis present

## 2011-07-18 DIAGNOSIS — D631 Anemia in chronic kidney disease: Secondary | ICD-10-CM | POA: Diagnosis present

## 2011-07-18 DIAGNOSIS — E860 Dehydration: Secondary | ICD-10-CM | POA: Diagnosis present

## 2011-07-18 DIAGNOSIS — N179 Acute kidney failure, unspecified: Secondary | ICD-10-CM | POA: Diagnosis present

## 2011-07-18 DIAGNOSIS — N184 Chronic kidney disease, stage 4 (severe): Secondary | ICD-10-CM | POA: Diagnosis present

## 2011-07-18 DIAGNOSIS — A088 Other specified intestinal infections: Secondary | ICD-10-CM | POA: Diagnosis present

## 2011-07-18 HISTORY — DX: Essential (primary) hypertension: I10

## 2011-07-18 LAB — CBC
MCH: 27.6 pg (ref 26.0–34.0)
MCHC: 33.7 g/dL (ref 30.0–36.0)
Platelets: 295 10*3/uL (ref 150–400)
RDW: 15.1 % (ref 11.5–15.5)

## 2011-07-18 LAB — BASIC METABOLIC PANEL
Calcium: 8.1 mg/dL — ABNORMAL LOW (ref 8.4–10.5)
Creatinine, Ser: 4.73 mg/dL — ABNORMAL HIGH (ref 0.50–1.10)
GFR calc Af Amer: 11 mL/min — ABNORMAL LOW (ref >60)

## 2011-07-18 LAB — LACTIC ACID, PLASMA: Lactic Acid, Venous: 0.8 mmol/L (ref 0.5–2.2)

## 2011-07-18 LAB — URINALYSIS, ROUTINE W REFLEX MICROSCOPIC
Bilirubin Urine: NEGATIVE
Glucose, UA: NEGATIVE mg/dL
Hgb urine dipstick: NEGATIVE
Specific Gravity, Urine: 1.015 (ref 1.005–1.030)
Urobilinogen, UA: 0.2 mg/dL (ref 0.0–1.0)

## 2011-07-18 LAB — DIFFERENTIAL
Basophils Absolute: 0 10*3/uL (ref 0.0–0.1)
Lymphocytes Relative: 49 % — ABNORMAL HIGH (ref 12–46)
Lymphs Abs: 3 10*3/uL (ref 0.7–4.0)
Neutro Abs: 2.4 10*3/uL (ref 1.7–7.7)
Neutrophils Relative %: 39 % — ABNORMAL LOW (ref 43–77)

## 2011-07-18 LAB — POCT I-STAT, CHEM 8
Chloride: 111 mEq/L (ref 96–112)
Glucose, Bld: 78 mg/dL (ref 70–99)
HCT: 38 % (ref 36.0–46.0)
Hemoglobin: 12.9 g/dL (ref 12.0–15.0)
Potassium: 5.9 mEq/L — ABNORMAL HIGH (ref 3.5–5.1)
Sodium: 133 mEq/L — ABNORMAL LOW (ref 135–145)

## 2011-07-18 LAB — POCT I-STAT TROPONIN I: Troponin i, poc: 0 ng/mL (ref 0.00–0.08)

## 2011-07-18 LAB — URINE MICROSCOPIC-ADD ON

## 2011-07-19 LAB — BASIC METABOLIC PANEL
CO2: 23 mEq/L (ref 19–32)
Chloride: 109 mEq/L (ref 96–112)
Creatinine, Ser: 3.65 mg/dL — ABNORMAL HIGH (ref 0.50–1.10)
Potassium: 4.6 mEq/L (ref 3.5–5.1)

## 2011-07-19 LAB — CBC
HCT: 31.7 % — ABNORMAL LOW (ref 36.0–46.0)
MCV: 81.9 fL (ref 78.0–100.0)
Platelets: 286 10*3/uL (ref 150–400)
RBC: 3.87 MIL/uL (ref 3.87–5.11)
WBC: 6.4 10*3/uL (ref 4.0–10.5)

## 2011-07-19 LAB — FECAL LACTOFERRIN, QUANT: Fecal Lactoferrin: POSITIVE

## 2011-07-19 NOTE — H&P (Signed)
NAMEHENRIETTE, Monica [name] NO.:  [id number]  MEDICAL RECORD NO.:  [id number]  LOCATION:  5526                         FACILITY:  [facility]  PHYSICIAN:  Tarry Kos, MD       DATE OF BIRTH:  1931/12/24  DATE OF ADMISSION:  07/18/2011 DATE OF DISCHARGE:                             HISTORY & PHYSICAL   CHIEF COMPLAINT:  Weakness.  HISTORY OF PRESENT ILLNESS:  Monica [name] is a pleasant 77 year old female who has a history of chronic renal failure, hypertension, idiopathic normal pressure hydrocephalus, chronic brain syndrome, who presents to the emergency department because of progressive weakness.  She has 3 children and also has a caregiver who stayed with her most of the day who is like a daughter to her who states that she has been weaker more than normal.  She again has someone taken care of her most of the day and she stays by herself at night, but Monica [name] has been experiencing some diarrhea for several days now and her caregiver is just now finding out about it.  She has been having at least 3-4 episodes of diarrhea a day.  She has been eating and drinking well, but she has been progressively getting weaker.  She denies any fever.  She denies currently any abdominal pain.  She did say she had some periumbilical abdominal cramping yesterday, but this is resolved.  She denies any dysuria, denies any nausea or vomiting.  She denies that the diarrhea is bloody and is not melanotic.  We are being asked to admit the patient for a worsening renal failure and dehydration.  REVIEW OF SYSTEMS:  Otherwise, negative.  PAST MEDICAL HISTORY:  Hypertension, stage IV chronic kidney disease, idiopathic normal pressure hydrocephalous, chronic brain syndrome, urinary incontinence, chronic anemia, GERD, hemorrhoids, status post hysterectomy.  MEDICATIONS:  The patient does not know what she is taking.  Per ER notes which is probably incorrect; Coreg, diazepam,  donepezil, gabapentin, Lasix, lisinopril, Premarin, propranolol, sertraline, tramadol.  ALLERGIES:  ASPIRIN and CODEINE.  SOCIAL HISTORY:  Nonsmoker.  No alcohol.  No IV drug abuse.  She lives alone but has a caregiver that stays with her most of the day.  She is DNR.  I have discussed this in front of her caregiver.  She wishes to not be resuscitated or to have CPR ever in the future.  She does not ever want to be intubated.  PHYSICAL EXAMINATION:  VITAL SIGNS:  Temperature 97.1, blood pressure 107/61, pulse 54, respirations 17, 96% O2 sats on room air. GENERAL:  She is alert and oriented x4, in no apparent stress, cooperating friendly. HEENT:  Extraocular muscles intact.  Pupils equal and reactive to light. Oropharynx clear.  Mucous membranes moist. NECK:  No JVD, no carotid bruits. CORONARY:  Regular rate and rhythm without murmurs, rubs, or gallops. CHEST:  Clear to auscultation bilaterally.  No wheeze, rhonchi, or rales. ABDOMEN:  Soft, nontender, and nondistended.  Positive bowel sounds.  No hepatosplenomegaly.  No rebound.  No guarding.  Nonacute abdomen. EXTREMITIES:  No clubbing, cyanosis, or edema. PSYCHIATRIC:  Normal mood and affect. NEUROLOGIC:  No focal neurologic deficits. SKIN:  No  rashes.  Troponin is negative.  BUN and creatinine is 87 and 4.73, potassium is 5.8, sodium is 133.  Lactic acid level is 0.8.  Hemoglobin is 10.5, white count is normal at 6.2, platelet count is normal.  Urinalysis shows some leukocytes, many bacteria, 21-50 white blood cells.  Right ankle x-ray, no acute fractures.  CT of the head, no acute issues. Chest x-ray is stable.  A 12-lead EKG, sinus bradycardia with first- degree atrioventricular block, no ST or T wave changes with a rate of 52.  ASSESSMENT AND PLAN:  This is a 77 year old female who came in with weakness and some mild altered mental status which is resolved, likely secondary due to dehydration, renal failure, and urinary  tract infection.  1. Metabolic encephalopathy which is already resolved which is     multifactorial. 2. Acute on chronic kidney disease with worsening creatinine due to     dehydration and urinary tract infection.  We will place her on IV     fluids and Rocephin and obtain urine culture. 3. Diarrhea.  This could be infectious or could be related to her     bladder infection or her renal failure.  I am going to send off for     Clostridium difficile, wbc's, O and P, and send for stool culture     and monitor for any further diarrhea. 4. Looking back at her labs of Apr 08, 2011, she had an INR of 2.18.     It is not clear as to whether or not she is on Coumadin.  I am     going to recheck her coags to see if she is chronically on     anticoagulation. 5. Clarify her home medication list as we have no idea what she takes     at home chronically.  I have written order when the med rec sheet     has been completed to call it to me later on today to resume most     of her home medications. 6. We will obtain a physical therapy evaluation. 7. The patient is DNR.  She does not want CPR or intubation in the     future.  Further recommendation depending on overall hospital     course.          ______________________________ Tarry Kos, MD     RD/MEDQ  D:  07/18/2011  T:  07/19/2011  Job:  [id number]  Electronically Signed by Tarry Kos MD on 07/19/2011 05:51:21 PM

## 2011-07-20 LAB — URINE CULTURE
Colony Count: >=100000
Culture  Setup Time: 201208101707

## 2011-07-20 LAB — PROTIME-INR: INR: 1.74 — ABNORMAL HIGH (ref 0.00–1.49)

## 2011-07-20 LAB — BASIC METABOLIC PANEL
CO2: 20 mEq/L (ref 19–32)
Chloride: 110 mEq/L (ref 96–112)
GFR calc Af Amer: 21 mL/min — ABNORMAL LOW (ref >60)
GFR calc non Af Amer: 18 mL/min — ABNORMAL LOW (ref >60)
Glucose, Bld: 99 mg/dL (ref 70–99)
Potassium: 4.3 mEq/L (ref 3.5–5.1)
Sodium: 141 mEq/L (ref 135–145)

## 2011-07-20 LAB — CBC
HCT: 31.3 % — ABNORMAL LOW (ref 36.0–46.0)
Hemoglobin: 10.3 g/dL — ABNORMAL LOW (ref 12.0–15.0)
MCH: 27 pg (ref 26.0–34.0)
MCHC: 32.9 g/dL (ref 30.0–36.0)
MCV: 82.2 fL (ref 78.0–100.0)
Platelets: 282 K/uL (ref 150–400)
RBC: 3.81 MIL/uL — ABNORMAL LOW (ref 3.87–5.11)
RDW: 15.2 % (ref 11.5–15.5)
WBC: 6.1 K/uL (ref 4.0–10.5)

## 2011-07-21 LAB — GIARDIA/CRYPTOSPORIDIUM SCREEN(EIA)

## 2011-07-22 NOTE — Discharge Summary (Signed)
NAMEMarland Kitchen  Monica [name], Monica [name] NO.:  [id number]  MEDICAL RECORD NO.:  [id number]  LOCATION:  5526                         FACILITY:  [facility]  PHYSICIAN:  Andreas Blower, MD       DATE OF BIRTH:  July 28, 1932  DATE OF ADMISSION:  07/18/2011 DATE OF DISCHARGE:  07/20/2011                              DISCHARGE SUMMARY   PRIMARY CARE PHYSICIAN:  Jarome Matin, MD  ORTHOPEDIC PHYSICIAN:  Kerrin Champagne, MD  DISCHARGE DIAGNOSES: 1. Escherichia coli urinary tract infection, sensitivities pending. 2. Acute renal failure or chronic kidney disease stage IV, resolved. 3. Generalized weakness likely due to urinary tract infection and     acute renal failure, resolved. 4. Hypertension. 5. History of right distal tibia and fibular fracture status post     surgery on April 05, 2011. 6. History of chronic brain syndrome. 7. History of idiopathic normal-pressure hydrocephalus. 8. History of urinary incontinence. 9. Diarrhea resolved likely due to viral gastroenteritis. 10.History of 14-mm cystic lesion in the uncinate process of pancreas. 11.History of mild rectal bleed from hemorrhoids in October 2011. 12.Anemia due to chronic kidney disease.  DISCHARGE MEDICATIONS: 1. Ciprofloxacin 250 mg p.o. twice daily for 4 days. 2. Gabapentin 100 mg p.o. twice daily. 3. Furosemide 40 mg every other day until evaluated by PCP. 4. Carvedilol 3.125 mg p.o. twice daily. 5. Coumadin 4 mg p.o. every Monday, Wednesday, and Friday.  The     patient was instructed to discuss with Dr. Otelia Sergeant when to     discontinue Coumadin. 6. Diazepam 2 mg p.o. three times a day as needed. 7. Quinapril 10 mg p.o. daily. 8. Lisinopril 10 mg p.o. daily. 9. Premarin 0.625 mg p.o. daily. 10.Propranolol 40 mg p.o. twice daily. 11.Sertraline 100 mg p.o. daily at bedtime. 12.Tramadol 50 mg three times day as needed for pain.  BRIEF ADMITTING HISTORY AND PHYSICAL:  Monica [name] is a 77 year old African American female  with history of chronic kidney disease stage IV, hyperlipidemia, idiopathic normal-pressure hydrocephalus, chronic brain syndrome who presents with complaint of weakness to the ER on July 19, 2011.  RADIOLOGY/IMAGING:  The patient had chest x-ray two-view, which showed stable cardiomegaly.  No active lung disease.  The patient had CT of the head without contrast, which showed progressive cerebral atrophy and moderate small vessel ischemic change. No acute intracranial abnormality.  The patient had x-ray of the right ankle, which showed status post ORIF for distal tibia and distal fibular fracture.  No acute fractures or hardware complications.  Diffuse soft tissue swelling.  Diffuse osteopenia noted.  LABORATORY DATA:  CBC shows a white count of 6.1, hemoglobin 10.3, hematocrit 31.3, platelet count 282.  INR 1.75.  Electrolytes normal except BUN is 47, creatinine 2.64.  Initial creatinine on presentation was 4.7.  HOSPITAL COURSE BY PROBLEM: 1. Generalized weakness likely due to urinary tract infection and     acute renal failure, improved with IV hydration and antibiotics. 2. E. coli urinary tract infection.  Sensitivities pending.  The     patient initially was started on ceftriaxone, which was     transitioned to ciprofloxacin, which she will continue for 4  more     days to complete a 5-day course of antibiotics.  The patient was     instructed to follow up with Dr. Bascom Levels at which time Dr. Bascom Levels     can address the urine culture sensitivities and change antibiotics     appropriately. 3. Acute renal failure on chronic kidney disease, stage IV improved     with IV hydration.  Suspect the patient may have been dehydrated     from diuretics.  At the time of discharge, the patient's furosemide     dose was decreased from 80 mg every other day to 40 mg every other     day.  Again, the patient was instructed to follow up with her     primary care physician, Dr. Bascom Levels to  determine at the next clinic     appointment whether to change or continue the furosemide dose. 4. Hypertension, stable.  Continue the patient on home medications. 5. Right distal tibia and fibular fracture status post surgery on     April 05, 2011.  Continue management as per Dr. Otelia Sergeant, her     orthopedic physician.  Continue home health with home health     physical therapy at discharge. 6. Chronic anticoagulation due to immobility from right distal tibia     and fibular fracture.  Again, the patient was instructed to discuss     with Dr. Otelia Sergeant to determine when the patient could be taken off     anticoagulation. 7. History of chronic brain syndrome and history of idiopathic normal-     pressure hydrocephalus, stable, were not an active issue during the     post-hospital stay. 8. Diarrhea, resolved during the course of hospital stay.  C. diff PCR     was done and was negative.  The patient's cecal lactoferrin was     positive, suspect may be positive due to viral gastroentiritis.  DISPOSITION AND FOLLOWUP:  The patient was instructed to follow up with Dr. Bascom Levels, her primary care physician in 1 week.  Time spent on discharge talking to the patient, family, and coordinating care was 35 minutes.     Andreas Blower, MD     SR/MEDQ  D:  07/20/2011  T:  07/20/2011  Job:  [id number]  Electronically Signed by Wardell Heath Docie Abramovich  on 07/22/2011 09:35:58 PM

## 2011-08-09 HISTORY — PX: FLEXIBLE SIGMOIDOSCOPY: SHX1649

## 2011-08-21 ENCOUNTER — Inpatient Hospital Stay (HOSPITAL_COMMUNITY)
Admission: EM | Admit: 2011-08-21 | Discharge: 2011-08-26 | DRG: 394 | Disposition: A | Discharge status: Skilled Nursing Facility | Payer: Medicare Other | Attending: Internal Medicine | Admitting: Internal Medicine

## 2011-08-21 ENCOUNTER — Emergency Department (HOSPITAL_COMMUNITY): Payer: Medicare Other

## 2011-08-21 DIAGNOSIS — D638 Anemia in other chronic diseases classified elsewhere: Secondary | ICD-10-CM | POA: Diagnosis present

## 2011-08-21 DIAGNOSIS — R32 Unspecified urinary incontinence: Secondary | ICD-10-CM | POA: Diagnosis present

## 2011-08-21 DIAGNOSIS — N183 Chronic kidney disease, stage 3 unspecified: Secondary | ICD-10-CM | POA: Diagnosis present

## 2011-08-21 DIAGNOSIS — G894 Chronic pain syndrome: Secondary | ICD-10-CM | POA: Diagnosis present

## 2011-08-21 DIAGNOSIS — F3289 Other specified depressive episodes: Secondary | ICD-10-CM | POA: Diagnosis present

## 2011-08-21 DIAGNOSIS — Z66 Do not resuscitate: Secondary | ICD-10-CM | POA: Diagnosis present

## 2011-08-21 DIAGNOSIS — G912 (Idiopathic) normal pressure hydrocephalus: Secondary | ICD-10-CM | POA: Diagnosis present

## 2011-08-21 DIAGNOSIS — F329 Major depressive disorder, single episode, unspecified: Secondary | ICD-10-CM | POA: Diagnosis present

## 2011-08-21 DIAGNOSIS — Z79899 Other long term (current) drug therapy: Secondary | ICD-10-CM

## 2011-08-21 DIAGNOSIS — E785 Hyperlipidemia, unspecified: Secondary | ICD-10-CM | POA: Diagnosis present

## 2011-08-21 DIAGNOSIS — F411 Generalized anxiety disorder: Secondary | ICD-10-CM | POA: Diagnosis present

## 2011-08-21 DIAGNOSIS — K6289 Other specified diseases of anus and rectum: Principal | ICD-10-CM | POA: Diagnosis present

## 2011-08-21 DIAGNOSIS — G5710 Meralgia paresthetica, unspecified lower limb: Secondary | ICD-10-CM | POA: Diagnosis present

## 2011-08-21 DIAGNOSIS — F0390 Unspecified dementia without behavioral disturbance: Secondary | ICD-10-CM | POA: Diagnosis present

## 2011-08-21 DIAGNOSIS — R627 Adult failure to thrive: Secondary | ICD-10-CM | POA: Diagnosis present

## 2011-08-21 DIAGNOSIS — M47812 Spondylosis without myelopathy or radiculopathy, cervical region: Secondary | ICD-10-CM | POA: Diagnosis present

## 2011-08-21 DIAGNOSIS — K219 Gastro-esophageal reflux disease without esophagitis: Secondary | ICD-10-CM | POA: Diagnosis present

## 2011-08-21 DIAGNOSIS — G250 Essential tremor: Secondary | ICD-10-CM | POA: Diagnosis present

## 2011-08-21 DIAGNOSIS — K649 Unspecified hemorrhoids: Secondary | ICD-10-CM | POA: Diagnosis present

## 2011-08-21 DIAGNOSIS — I129 Hypertensive chronic kidney disease with stage 1 through stage 4 chronic kidney disease, or unspecified chronic kidney disease: Secondary | ICD-10-CM | POA: Diagnosis present

## 2011-08-21 DIAGNOSIS — F1011 Alcohol abuse, in remission: Secondary | ICD-10-CM | POA: Diagnosis present

## 2011-08-21 HISTORY — DX: Unspecified atrial flutter: I48.92

## 2011-08-21 HISTORY — DX: Gastro-esophageal reflux disease without esophagitis: K21.9

## 2011-08-21 HISTORY — DX: Alcohol dependence, uncomplicated: F10.20

## 2011-08-21 HISTORY — DX: Unspecified osteoarthritis, unspecified site: M19.90

## 2011-08-21 HISTORY — DX: Unspecified dementia, unspecified severity, without behavioral disturbance, psychotic disturbance, mood disturbance, and anxiety: F03.90

## 2011-08-21 LAB — PROTIME-INR
INR: 1.05 (ref 0.00–1.49)
Prothrombin Time: 13.9 seconds (ref 11.6–15.2)

## 2011-08-21 LAB — CBC
HCT: 32.7 % — ABNORMAL LOW (ref 36.0–46.0)
Hemoglobin: 11 g/dL — ABNORMAL LOW (ref 12.0–15.0)
MCH: 27.4 pg (ref 26.0–34.0)
MCHC: 33.6 g/dL (ref 30.0–36.0)
MCV: 81.3 fL (ref 78.0–100.0)
Platelets: 256 10*3/uL (ref 150–400)
RBC: 4.02 MIL/uL (ref 3.87–5.11)
RDW: 14.5 % (ref 11.5–15.5)
WBC: 8.4 10*3/uL (ref 4.0–10.5)

## 2011-08-21 LAB — DIFFERENTIAL
Basophils Absolute: 0 10*3/uL (ref 0.0–0.1)
Basophils Relative: 0 % (ref 0–1)
Eosinophils Absolute: 0.1 10*3/uL (ref 0.0–0.7)
Eosinophils Relative: 1 % (ref 0–5)
Lymphocytes Relative: 50 % — ABNORMAL HIGH (ref 12–46)
Lymphs Abs: 4.2 10*3/uL — ABNORMAL HIGH (ref 0.7–4.0)
Monocytes Absolute: 0.7 10*3/uL (ref 0.1–1.0)
Monocytes Relative: 9 % (ref 3–12)
Neutro Abs: 3.4 10*3/uL (ref 1.7–7.7)
Neutrophils Relative %: 40 % — ABNORMAL LOW (ref 43–77)

## 2011-08-21 LAB — COMPREHENSIVE METABOLIC PANEL
ALT: 8 U/L (ref 0–35)
AST: 13 U/L (ref 0–37)
Albumin: 3.9 g/dL (ref 3.5–5.2)
Alkaline Phosphatase: 79 U/L (ref 39–117)
BUN: 21 mg/dL (ref 6–23)
CO2: 26 mEq/L (ref 19–32)
Calcium: 9.8 mg/dL (ref 8.4–10.5)
Chloride: 99 mEq/L (ref 96–112)
Creatinine, Ser: 1.87 mg/dL — ABNORMAL HIGH (ref 0.50–1.10)
GFR calc Af Amer: 32 mL/min — ABNORMAL LOW (ref >60)
GFR calc non Af Amer: 26 mL/min — ABNORMAL LOW (ref >60)
Glucose, Bld: 81 mg/dL (ref 70–99)
Potassium: 3.7 mEq/L (ref 3.5–5.1)
Sodium: 135 mEq/L (ref 135–145)
Total Bilirubin: 0.3 mg/dL (ref 0.3–1.2)
Total Protein: 8.1 g/dL (ref 6.0–8.3)

## 2011-08-21 LAB — URINALYSIS, ROUTINE W REFLEX MICROSCOPIC
Glucose, UA: NEGATIVE mg/dL
Hgb urine dipstick: NEGATIVE
Specific Gravity, Urine: 1.013 (ref 1.005–1.030)
Urobilinogen, UA: 0.2 mg/dL (ref 0.0–1.0)
pH: 6 (ref 5.0–8.0)

## 2011-08-21 LAB — APTT: aPTT: 26 seconds (ref 24–37)

## 2011-08-21 LAB — POCT I-STAT TROPONIN I: Troponin i, poc: 0.06 ng/mL (ref 0.00–0.08)

## 2011-08-21 LAB — OCCULT BLOOD, POC DEVICE: Fecal Occult Bld: NEGATIVE

## 2011-08-22 ENCOUNTER — Emergency Department (HOSPITAL_COMMUNITY): Payer: Medicare Other

## 2011-08-22 ENCOUNTER — Encounter (HOSPITAL_COMMUNITY): Payer: Self-pay | Admitting: Radiology

## 2011-08-22 LAB — TSH: TSH: 1.439 u[IU]/mL (ref 0.350–4.500)

## 2011-08-22 LAB — LACTIC ACID, PLASMA: Lactic Acid, Venous: 0.9 mmol/L (ref 0.5–2.2)

## 2011-08-23 LAB — COMPREHENSIVE METABOLIC PANEL
ALT: 5 U/L (ref 0–35)
AST: 11 U/L (ref 0–37)
Albumin: 2.9 g/dL — ABNORMAL LOW (ref 3.5–5.2)
Alkaline Phosphatase: 66 U/L (ref 39–117)
CO2: 23 mEq/L (ref 19–32)
Chloride: 107 mEq/L (ref 96–112)
Creatinine, Ser: 1.96 mg/dL — ABNORMAL HIGH (ref 0.50–1.10)
GFR calc non Af Amer: 25 mL/min — ABNORMAL LOW (ref >60)
Potassium: 3.8 mEq/L (ref 3.5–5.1)
Total Bilirubin: 0.2 mg/dL — ABNORMAL LOW (ref 0.3–1.2)

## 2011-08-23 LAB — CBC
HCT: 30.2 % — ABNORMAL LOW (ref 36.0–46.0)
Hemoglobin: 10.1 g/dL — ABNORMAL LOW (ref 12.0–15.0)
MCH: 27 pg (ref 26.0–34.0)
MCHC: 33.4 g/dL (ref 30.0–36.0)
Platelets: 242 10*3/uL (ref 150–400)

## 2011-08-23 LAB — FECAL LACTOFERRIN, QUANT: Fecal Lactoferrin: POSITIVE

## 2011-08-24 LAB — BASIC METABOLIC PANEL
BUN: 20 mg/dL (ref 6–23)
CO2: 22 mEq/L (ref 19–32)
Calcium: 8.6 mg/dL (ref 8.4–10.5)
Creatinine, Ser: 2.08 mg/dL — ABNORMAL HIGH (ref 0.50–1.10)
GFR calc non Af Amer: 23 mL/min — ABNORMAL LOW (ref >60)
Glucose, Bld: 87 mg/dL (ref 70–99)
Sodium: 140 mEq/L (ref 135–145)

## 2011-08-25 ENCOUNTER — Other Ambulatory Visit: Payer: Self-pay | Admitting: Gastroenterology

## 2011-08-25 LAB — BASIC METABOLIC PANEL
BUN: 23 mg/dL (ref 6–23)
CO2: 22 mEq/L (ref 19–32)
Chloride: 107 mEq/L (ref 96–112)
GFR calc non Af Amer: 21 mL/min — ABNORMAL LOW (ref >60)
Glucose, Bld: 86 mg/dL (ref 70–99)
Potassium: 3.7 mEq/L (ref 3.5–5.1)
Sodium: 139 mEq/L (ref 135–145)

## 2011-08-27 NOTE — Discharge Summary (Signed)
NAMEMarland Kitchen  ABRAR, KOONE NO.:  [id number]  MEDICAL RECORD NO.:  [id number]  LOCATION:  4503                         FACILITY:  [facility]  PHYSICIAN:  Isidor Holts, M.D.  DATE OF BIRTH:  1932-07-16  DATE OF ADMISSION:  08/21/2011 DATE OF DISCHARGE:  08/26/2011                        DISCHARGE SUMMARY - REFERRING   PRIMARY MD:  Jarome Matin, MD  PRIMARY ORTHOPEDIC SURGEON:  Kerrin Champagne, MD  DISCHARGE DIAGNOSES: 1. Proctitis, status post sigmoidoscopy/biopsies on August 25, 2011. 2. Chronic kidney disease stage III.  Baseline creatinine 2.43. 3. Hypertension. 4. History of normal-pressure hydrocephalus/chronic pain syndrome. 5. History of bilateral myalgia paresthetica. 6. Anxiety/depression. 7. History of essential tremor. 8. Gastroesophageal reflux disease. 9. Hemorrhoids. 10.History of open reduction and internal fixation for right     tibia/fibular fracture on March 23, 2011. 11.Cervical spondylosis. 12.History of right rotator cuff surgery.  DISCHARGE MEDICATIONS: 1. Amlodipine 10 mg p.o. daily. 2. Citalopram 20 mg p.o. daily (was on 40 mg p.o. daily). 3. Coreg 3.125 mg p.o. b.i.d. 4. Cyclobenzaprine 10 mg p.o. b.i.d. 5. Diazepam 2 mg p.o. p.r.n. t.i.d. for anxiety. 6. Donepezil 10 mg p.o. daily. 7. Oxycodone 5 mg p.o. p.r.n. q.6 h. for pain. 8. Patanol 0.1% ophthalmic solution 1 drop to each eye b.i.d. 9. Premarin 0.65 mg p.o. daily. 10.Tramadol 50 mg p.o. p.r.n. t.i.d. for pain. 11.Zoloft 100 mg p.o. nightly.  Note: Lisinopril has been discontinued, secondary to chronic kidney disease.  PROCEDURES: 1. Chest x-ray on August 21, 2011.  This showed linear retrocardiac     opacity, likely scarring or atelectasis.  Heart size in upper     limits to mildly enlarged. 2. Abdominal/pelvic CT scan on August 22, 2011.  This showed     perirectal edema/inflammation proctitis with a consideration.     Rectal neoplasm not excluded.   Diverticular disease is seen in the     left colon without diverticulitis.  CONSULTATIONS: 1. Willis Modena, MD, gastroenterologist. 2. Llana Aliment. Randa Evens, MD, gastroenterologist.  ADMISSION HISTORY:  As in H and P notes of August 22, 2011, dictated by this MD.  However, in brief, this is a 77 year old female, with known history of chronic kidney disease stage III, baseline creatinine 2.43; hypertension; normal-pressure hydrocephalus; chronic pain syndrome; bilateral meralgia paresthetica; anxiety/depression; essential tremor; moderate obesity; anemia of chronic disease; dyslipidemia; history of 4- mm cystic lesion in the uncinate process of the pancreas; hemorrhoids; GERD; history of urinary incontinence, status post hysterectomy; status post ORIF, right distal tibia/fibular fracture on March 23, 2011; status post right rotator cuff surgery; cervical spondylosis; history of bilateral foot surgeries, presenting with bilateral lower quadrant abdominal pain, and failure to thrive.  The patient lives alone and finds herself quite unable to cope.  She was admitted for further evaluation and investigation management.  CLINICAL COURSE: 1. Proctitis.  The patient did present with bilateral lower quadrant     abdominal pain, and what she described as dark stools, occasionally     liquid about three times a day, not associated with vomiting.  She     was admitted.  Abdominal/pelvic CT scan was done, which showed  findings consistent with proctitis, but no other acute intra-     abdominal process.  C. Difficile was negative by PCR.  GI     consultation was kindly provided by Dr. Willis Modena, who     recommended flexible sigmoidoscopy, which was then carried out by     Dr. Carman Ching on August 25, 2011, and revealed normal     mucosa.  Biopsies were then done.  The patient has since remained     asymptomatic, since her hospitalization.  It is likely that this may     be a  nonspecific colitis secondary to coprostasis. Be that as it may,     we shall await biopsy findings.  2. Chronic kidney disease stage III.  The patient's renal function     remained stable during the course of this hospitalization.  BUN was     23 and creatinine 2.22 on August 25, 2011.  3. Hypertension.  The patient was normotensive throughout the course     of her hospitalization, on a combination of Coreg and Norvasc.  4. History of anxiety/depression.  There were no problems referable to     this, on preadmission antidepressant medication.  The patient's     Celexa dosage has been reduced to 20 mg p.o. daily from 40 mg, given     the patient's age.  5. History of orthopedic problems.  This has not been problematic.  DISPOSITION:  The patient as of August 25, 2011, was asymptomatic. There were no new issues.  She was evaluated by PT/OT during the course of this hospitalization and recommended short-term SNF placement, before discharge to assisted living facility.  It is clear that the patient can no longer live alone and she will be unable to cope.  The patient, provided no acute problems arise in the interim and appropriate placement can be effected, will be discharged on August 26, 2011.  ACTIVITY:  As tolerated.  Recommended to increase activity slowly, otherwise, per PT/ OT.  DIET:  Heart healthy.  FOLLOWUP INSTRUCTIONS:  The patient will follow up with SNF MD, and following discharge from SNF, will follow up routinely with her primary MD, Dr. Jeri Cos, as well as with her orthopedic surgeon, Dr. Vira Browns.     Isidor Holts, M.D.     CO/MEDQ  D:  08/25/2011  T:  08/25/2011  Job:  [id number]  cc:   Jarome Matin, M.D. Willis Modena, MD Kerrin Champagne, M.D. James L. Malon Kindle., M.D.  Electronically Signed by Isidor Holts M.D. on 08/27/2011 09:58:19 PM

## 2011-08-27 NOTE — H&P (Signed)
NAME:  Monica [name], SIPLE NO.:  [id number]  MEDICAL RECORD NO.:  [id number]  LOCATION:                                 FACILITY:  PHYSICIAN:  Isidor Holts, M.D.  DATE OF BIRTH:  1932-04-07  DATE OF ADMISSION:  08/22/2011 DATE OF DISCHARGE:                             HISTORY & PHYSICAL   PRIMARY MEDICAL DOCTOR:  Jarome Matin, MD  PRIMARY ORTHOPEDIC SURGEON:  Kerrin Champagne, MD  CHIEF COMPLAINT:  Abdominal pain, dark stools, failure to thrive, lives alone.  HISTORY OF PRESENT ILLNESS:  This is a 77 year old female.  The patient is actually quite a good historian with some prompting, although history is also obtained from ED MD.  She is status post hospitalization at [facility] from July 18, 2011, to July 20, 2011, for E. coli UTI, acute renal failure on chronic kidney disease stage IV and viral gastroenteritis.  According to the patient, since her surgery on April 05, 2011, she has been having "dark stools" which are loose and watery, i.e., over a period of three and half to four months.  She moves her bowels about three times a day.  Denies any vomiting.  Denies fever or chills.  On August 21, 2011, at about 10:00 a.m., she developed bilateral lower quadrant abdominal pain, described as "crampy" on and off.  She "felt cold" all day.  Denies cough, shortness of breath, or chest pain.  Denies any recent sick contacts.  She has a caregiver who brought her to the emergency department.  According to her, she had only managed bowl of cereal on August 21, 2011, after abdominal pain started.  PAST MEDICAL HISTORY: 1. Chronic kidney disease, stage III-IV (baseline creatinine 2.43). 2. Hypertension. 3. Normal pressure hydrocephalus. 4. Chronic brain syndrome. 5. Bilateral meralgia paresthetica. 6. Anxiety/depression. 7. Essential tremor. 8. Moderate obesity. 9. Anemia of chronic disease. 10.Dyslipidemia. 11.History of 4-mm cystic  lesion in the uncinate process of the     pancreas. 12.Hemorrhoids. 13.GERD. 14.History of urinary incontinence. 15.Status post hysterectomy. 16.Status post ORIF of right distal tibia/fibular fracture, April 05, 2011. 17.Status post right rotator cuff surgery. 18.Cervical spondylosis. 19.Status post bilateral foot surgeries. 20.Anticoagulation until fairly recently, indication unclear, although     discharge summary of July 20, 2011, indicates that this was     instituted following her fracture ORIF surgery.  ALLERGIES:  These are multiple and include: 1. CODEINE. 2. ASPIRIN. 3. PENICILLIN. 4. SULFA.  MEDICATION HISTORY: 1. Coreg 3.125 mg p.o. b.i.d. 2. Citalopram 40 mg p.o. daily. 3. Aricept 10 mg p.o. daily. 4. Lisinopril 10 mg p.o. daily. 5. Diazepam 2 mg p.o. p.r.n. t.i.d. for anxiety. 6. Patanol 0.1% ophthalmic solution 1 drop in each eye b.i.d. 7. Zoloft 100 mg p.o. nightly. 8. Oxycodone 5 mg p.o. p.r.n. q.6 h. for pain. 9. Cyclobenzaprine 10 mg p.o. b.i.d. 10.Premarin (0.625 mg) 1 tablet p.o. daily. 11.Tramadol 50 mg p.o. p.r.n. t.i.d. for pain.  REVIEW OF SYSTEMS:  As per HPI and chief complaint.  The patient denies vomiting, denies headache.  Admits to poor appetite.  Rest of systems review is negative.  SOCIAL  HISTORY:  The patient is widowed.  She is retired, had five offsprings, one died of gunshot wound, another died from HIV/AIDS.  The patient's remaining three children live in [place], [place], and [place].  She is a nonsmoker, had a remote history of alcohol abuse, but has now been abstinent for several years.  No history of drug abuse. The patient lives alone although she has a caregiver who comes in from 2:00 p.m. to 6:00 p.m. on Tuesdays and Thursdays.  According to her, her friends used to do her cooking for her but now nobody does any cooking for her.  She has "friends" who go out and bring her food from fast food, etc.  She is able to  do her own ADLs, ambulates with a walker, and also a wheelchair.  According to her, she is able to transfer by herself.  She feels herself increasingly unable to cope and her caregiver is also of this opinion.  FAMILY HISTORY:  The patient's mother died of alcoholism.  Her father died of old age at 50 years.  She has no siblings.  PHYSICAL EXAMINATION:  VITAL SIGNS:  Temperature 98.4, pulse 55 per minute and regular, respiratory rate 25, BP 136/59 mmHg, pulse oximeter 99% on room air. GENERAL:  The patient did not appear to be in obvious acute distress at he time of this evaluation, alert, communicative, not short of breath at rest. HEENT:  No clinical pallor, no jaundice.  No conjunctival injection. Throat is clear. NECK:  Supple.  JVP not seen.  No palpable lymphadenopathy.  No palpable goiter. CHEST:  Clinically clear to auscultation.  No wheezes, no crackles. CARDIAC:  Heart sounds 1 and 2 heard, normal, regular.  No murmurs. ABDOMEN:  Moderately obese, soft, nontender.  Unable to palpate organs. The patient has mild-to-moderate tenderness to deep palpation in bilateral lower quadrants.  Bowel sounds are heard. EXTREMITIES:  Lower extremity examination, no pitting edema.  Palpable peripheral pulses. MUSCULOSKELETAL:  Generalized osteoarthritic changes. CENTRAL NERVOUS SYSTEM:  No focal neurologic deficit on gross examination.  INVESTIGATIONS:  CBC:  WBC 8.4, hemoglobin 11.0, hematocrit 32.7, platelets 256.  INR is 1.05.  Electrolytes:  Sodium 135, potassium 3.7, chloride 99, CO2 of 26, BUN 21, creatinine 1.87, glucose 81.  Troponin I point of care 0.06.  Urinalysis is negative.  Fecal occult blood testing is negative. Chest x-ray, August 21, 2011, shows linear retrocardiac opacity, likely scarring and atelectasis although early infiltrate cannot be totally excluded. A 12-lead EKG, August 21, 2011, shows sinus rhythm, regular, normal axis, 54 per minute, no acute  ischemic changes.  ASSESSMENT AND PLAN: 1. Abdominal pain/chronic diarrhea.  Etiology is unclear; however,     colitis is to be ruled out.  We shall get abdominal CT scan with     p.o. contrast, since the patient has renal insufficiency.  We will     start maintenance intravenous fluids, do stool studies including     Clostridium difficile, given hospitalization approximately 1 month     ago.  We shall place the patient on enteric precautions for now and     place on a regular/renal diet for now.  2. Chronic kidney disease.  This appears to be stable.  We shall     follow renal indices.  3. Hypertension.  This is controlled.  Continue preadmission     antihypertensive medications.  4. Chronic anemia.  Hemoglobin appears reasonable at the present time.     It is likely to be  anemia of chronic disease.  5. Anxiety/depression.  The patient's mood remains stable.  We shall     continue preadmission antidepressants.  6. Gastroesophageal reflux disease.  We shall manage with H2RA, as PPI     would be advisable in a patient who might have Clostridium     difficile colitis.  7. Normal-pressure hydrocephalus.  This appears to be stable.  8. Failure to cope.  The patient will need placement, as it has become     increasingly difficult for her to cope alone at home and her     caregiver is not there 24/7.  The patient appears to need 24/7     care.  9. Recent anticoagulation.  No obvious indication at present time, we     shall not continue.    Note: The patient's electronic medical records     and in particular, her last discharge summary, confirmed that the     patient's code status is DNR.  We shall respect this status during     the course of her hospitalization.     Isidor Holts, M.D.     CO/MEDQ  D:  08/22/2011  T:  08/22/2011  Job:  [id number]  cc:   Jarome Matin, M.D. Kerrin Champagne, M.D.  Electronically Signed by Isidor Holts M.D. on 08/27/2011 09:53:46  PM

## 2011-08-28 NOTE — Op Note (Signed)
  NAMERONAE, NOELL                  ACCOUNT NO.:  [id number]  MEDICAL RECORD NO.:  [id number]  LOCATION:  4503                         FACILITY:  [facility]  PHYSICIAN:  Eliani Leclere L. Malon Kindle., M.D.DATE OF BIRTH:  [birth year]  DATE OF PROCEDURE:  08/25/2011 DATE OF DISCHARGE:                              OPERATIVE REPORT   PROCEDURE:  Flexible sigmoidoscopy and biopsy.  MEDICATIONS:  Fentanyl 40 mcg and Versed 2 mg IV.  INDICATIONS:  Diarrhea and thickened rectum and sigmoid on CT.  Negative C. diff toxin with positive stool lactoferrin.  The patient has been on previous antibiotics.  DESCRIPTION OF PROCEDURE:  Procedure had been explained to the patient and consent was obtained.  On the left lateral decubitus position, the Pentax pediatric scope was inserted.  The patient had been prepped with 2 enemas.  We advanced to 35-40 cm with solid stool there.  There was no liquid stool.  We sucked through the scope.  The mucosa was slightly thickened, possibly from enemas.  There were no pseudomembranes, no ulcerations, and no gross active colitis.  Several random biopsies were taken.  Scope was withdrawn and the patient tolerated procedure well.  ASSESSMENT:  Basically normal flexible sigmoidoscopy and biopsy with no signs of pseudomembranes or gross colitis.  PLAN:  We will feed the patient and follow her clinically.          ______________________________ Llana Aliment Malon Kindle., M.D.     Waldron Session  D:  08/25/2011  T:  08/25/2011  Job:  [id number]  Electronically Signed by Carman Ching M.D. on 08/28/2011 12:01:54 PM

## 2011-09-03 NOTE — Consult Note (Signed)
NAMEMAYETTA, CASTLEMAN                  ACCOUNT NO.:  [id number]  MEDICAL RECORD NO.:  [id number]  LOCATION:                                 FACILITY:  PHYSICIAN:  Willis Modena, MD     DATE OF BIRTH:  [birth year]  DATE OF CONSULTATION:  08/24/2011 DATE OF DISCHARGE:                                CONSULTATION   REASON FOR CONSULTATION:  Diarrhea, abdominal pain.  CHIEF COMPLAINT:  Diarrhea.  HISTORY OF PRESENT ILLNESS:  Ms. Gardella is a 77 year old female with multiple medical problems including chronic dementia, chronic kidney disease, hypertension.  She was recently discharged from the hospital after a bout of urinary tract infection with urosepsis.  She was placed on broad-spectrum antibiotics.  She was admitted for complaints of abdominal pain, dark-colored stool, and diarrhea.  She has had C. diff PCR that has been negative but her fecal lactoferrin was positive.  Her history is somewhat limited but she tells me that her dark stools have been ongoing for the past several months but have improved recently. These postdated her surgery for a fibular-tibia fracture.  She denies ever having had a colonoscopy.  She endorses having some chronic weight loss but could not give any further history.  CT scan to evaluate all these symptoms showed that she had some perirectal inflammation.  Past medical history, past surgical history, home medications, allergies, family history, social history, review of systems, all from the dictated note from Dr. Brien Few from August 22, 2011, are reviewed and I agree.  PHYSICAL EXAMINATION:  VITAL SIGNS:  Blood pressure is 129/69, heart rate 60s, temperature 97.2, respiratory rate 20, oxygen saturation 100% on room air. GENERAL:  Ms. Grimes is nontoxic appearing.  She has possible dementia listed in her medical list but she is able to tell me where she is and what year it is, and she seems to understand our conversation. HEENT:  ENT:  Slightly dry mucous  membranes.  No oropharyngeal lesions. Eyes:  Sclerae anicteric.  Conjunctivae pink. NECK:  Supple. LUNGS:  Clear. HEART:  Distant heart sounds but regular. ABDOMEN:  Obese, soft, protuberant, nontender, nondistended.  No obvious liver or splenic enlargement.  No bulging flanks to suggest ascites. EXTREMITIES:  No peripheral cyanosis, clubbing, or edema. NEUROLOGIC:  Diffusely nonfocal without lateralizing signs. PSYCHIATRIC:  Alert and oriented. LYMPHATICS:  No axillary, submandibular, or supraclavicular adenopathy.  LABORATORY STUDIES:  Hemoglobin is 10.1, white count 5.6, platelet count is 242.  Sodium 140, potassium 3.9, chloride 99, bicarb 22, BUN 20, creatinine 2.1.  Stool studies:  She had a C. diff PCR that was negative.  Fecal lactoferrin that was positive.  Stool cultures are pending.  RADIOLOGIC STUDIES:  As mentioned, CT of the abdomen and pelvis dated August 22, 2011, that showed some perirectal inflammation and edema.  IMPRESSION:  Ms. Berrett is a 77 year old female with recent history of antibiotics for urinary tract infection with urosepsis.  She presents with lower abdominal pain and diarrhea.  Clostridium difficile PCR and stool studies were negative.  Fecal lactoferrin was positive and stool cultures are pending.  CT scan showed perirectal inflammation/edema. She does endorse history of dark stools  for several months as well.  PLAN: 1. Agree with supportive management, IV fluids, and awaiting final     stool culture results. 2. We will proceed with sigmoidoscopy tomorrow for further evaluation.     She does not recall whether she has ever had a colonoscopy.  I do     not see a colonoscopy report in our computer system. 3. If the sigmoidoscopy is unrevealing and she does have documented     black stools now or in the future, one could consider an upper     endoscopy, although her stools have been Hemoccult negative here in     the hospital. 4. Further  recommendations are pending sigmoidoscopy findings.  Thank you for allowing me to participate in Ms. Dorey's care.     Willis Modena, MD     WO/MEDQ  D:  08/24/2011  T:  08/24/2011  Job:  [id number]  Electronically Signed by Willis Modena  on 09/03/2011 05:53:08 PM

## 2013-02-17 ENCOUNTER — Ambulatory Visit
Admission: RE | Admit: 2013-02-17 | Discharge: 2013-02-17 | Disposition: A | Discharge status: Home / Self Care | Payer: Medicare Other | Source: Ambulatory Visit | Attending: Nephrology | Admitting: Nephrology

## 2013-02-17 ENCOUNTER — Other Ambulatory Visit: Payer: Self-pay | Admitting: Nephrology

## 2013-02-17 DIAGNOSIS — R52 Pain, unspecified: Secondary | ICD-10-CM

## 2013-12-08 DIAGNOSIS — K259 Gastric ulcer, unspecified as acute or chronic, without hemorrhage or perforation: Secondary | ICD-10-CM

## 2013-12-08 HISTORY — DX: Gastric ulcer, unspecified as acute or chronic, without hemorrhage or perforation: K25.9

## 2013-12-28 ENCOUNTER — Emergency Department (INDEPENDENT_AMBULATORY_CARE_PROVIDER_SITE_OTHER)
Admission: EM | Admit: 2013-12-28 | Discharge: 2013-12-28 | Disposition: A | Discharge status: Home / Self Care | Payer: Medicare FFS | Source: Home / Self Care | Attending: Emergency Medicine | Admitting: Emergency Medicine

## 2013-12-28 ENCOUNTER — Emergency Department (HOSPITAL_COMMUNITY): Payer: Medicare FFS

## 2013-12-28 ENCOUNTER — Encounter (HOSPITAL_COMMUNITY): Payer: Self-pay | Admitting: Emergency Medicine

## 2013-12-28 ENCOUNTER — Inpatient Hospital Stay (HOSPITAL_COMMUNITY)
Admission: EM | Admit: 2013-12-28 | Discharge: 2013-12-31 | DRG: 348 | Disposition: A | Discharge status: Home / Self Care | Payer: Medicare FFS | Attending: Internal Medicine | Admitting: Internal Medicine

## 2013-12-28 DIAGNOSIS — K922 Gastrointestinal hemorrhage, unspecified: Secondary | ICD-10-CM

## 2013-12-28 DIAGNOSIS — K219 Gastro-esophageal reflux disease without esophagitis: Secondary | ICD-10-CM | POA: Diagnosis present

## 2013-12-28 DIAGNOSIS — I4891 Unspecified atrial fibrillation: Secondary | ICD-10-CM | POA: Diagnosis present

## 2013-12-28 DIAGNOSIS — R109 Unspecified abdominal pain: Secondary | ICD-10-CM

## 2013-12-28 DIAGNOSIS — L0291 Cutaneous abscess, unspecified: Secondary | ICD-10-CM

## 2013-12-28 DIAGNOSIS — K612 Anorectal abscess: Secondary | ICD-10-CM | POA: Diagnosis present

## 2013-12-28 DIAGNOSIS — Z885 Allergy status to narcotic agent status: Secondary | ICD-10-CM

## 2013-12-28 DIAGNOSIS — K921 Melena: Secondary | ICD-10-CM

## 2013-12-28 DIAGNOSIS — N185 Chronic kidney disease, stage 5: Secondary | ICD-10-CM | POA: Diagnosis present

## 2013-12-28 DIAGNOSIS — G4700 Insomnia, unspecified: Secondary | ICD-10-CM | POA: Diagnosis present

## 2013-12-28 DIAGNOSIS — A498 Other bacterial infections of unspecified site: Secondary | ICD-10-CM | POA: Diagnosis present

## 2013-12-28 DIAGNOSIS — L0231 Cutaneous abscess of buttock: Secondary | ICD-10-CM | POA: Diagnosis present

## 2013-12-28 DIAGNOSIS — E860 Dehydration: Secondary | ICD-10-CM | POA: Diagnosis present

## 2013-12-28 DIAGNOSIS — B9620 Unspecified Escherichia coli [E. coli] as the cause of diseases classified elsewhere: Secondary | ICD-10-CM

## 2013-12-28 DIAGNOSIS — I480 Paroxysmal atrial fibrillation: Secondary | ICD-10-CM | POA: Diagnosis present

## 2013-12-28 DIAGNOSIS — R195 Other fecal abnormalities: Secondary | ICD-10-CM

## 2013-12-28 DIAGNOSIS — R1013 Epigastric pain: Secondary | ICD-10-CM

## 2013-12-28 DIAGNOSIS — I10 Essential (primary) hypertension: Secondary | ICD-10-CM

## 2013-12-28 DIAGNOSIS — N039 Chronic nephritic syndrome with unspecified morphologic changes: Secondary | ICD-10-CM

## 2013-12-28 DIAGNOSIS — K254 Chronic or unspecified gastric ulcer with hemorrhage: Secondary | ICD-10-CM

## 2013-12-28 DIAGNOSIS — L03317 Cellulitis of buttock: Secondary | ICD-10-CM

## 2013-12-28 DIAGNOSIS — D631 Anemia in chronic kidney disease: Secondary | ICD-10-CM | POA: Diagnosis present

## 2013-12-28 DIAGNOSIS — D62 Acute posthemorrhagic anemia: Secondary | ICD-10-CM | POA: Diagnosis present

## 2013-12-28 DIAGNOSIS — Z886 Allergy status to analgesic agent status: Secondary | ICD-10-CM

## 2013-12-28 DIAGNOSIS — I4892 Unspecified atrial flutter: Secondary | ICD-10-CM | POA: Diagnosis present

## 2013-12-28 DIAGNOSIS — N184 Chronic kidney disease, stage 4 (severe): Secondary | ICD-10-CM

## 2013-12-28 DIAGNOSIS — I129 Hypertensive chronic kidney disease with stage 1 through stage 4 chronic kidney disease, or unspecified chronic kidney disease: Secondary | ICD-10-CM | POA: Diagnosis present

## 2013-12-28 DIAGNOSIS — M129 Arthropathy, unspecified: Secondary | ICD-10-CM | POA: Diagnosis present

## 2013-12-28 DIAGNOSIS — F0390 Unspecified dementia without behavioral disturbance: Secondary | ICD-10-CM | POA: Diagnosis present

## 2013-12-28 DIAGNOSIS — L0390 Cellulitis, unspecified: Secondary | ICD-10-CM

## 2013-12-28 DIAGNOSIS — N179 Acute kidney failure, unspecified: Secondary | ICD-10-CM

## 2013-12-28 DIAGNOSIS — A4902 Methicillin resistant Staphylococcus aureus infection, unspecified site: Secondary | ICD-10-CM | POA: Diagnosis present

## 2013-12-28 DIAGNOSIS — Z87891 Personal history of nicotine dependence: Secondary | ICD-10-CM

## 2013-12-28 DIAGNOSIS — R079 Chest pain, unspecified: Secondary | ICD-10-CM

## 2013-12-28 DIAGNOSIS — N390 Urinary tract infection, site not specified: Secondary | ICD-10-CM | POA: Diagnosis present

## 2013-12-28 DIAGNOSIS — D72829 Elevated white blood cell count, unspecified: Secondary | ICD-10-CM

## 2013-12-28 DIAGNOSIS — K611 Rectal abscess: Secondary | ICD-10-CM

## 2013-12-28 HISTORY — DX: Gastrointestinal hemorrhage, unspecified: K92.2

## 2013-12-28 LAB — URINALYSIS, ROUTINE W REFLEX MICROSCOPIC
Bilirubin Urine: NEGATIVE
Glucose, UA: NEGATIVE mg/dL
KETONES UR: NEGATIVE mg/dL
NITRITE: NEGATIVE
PH: 5.5 (ref 5.0–8.0)
Protein, ur: 100 mg/dL — AB
SPECIFIC GRAVITY, URINE: 1.014 (ref 1.005–1.030)
UROBILINOGEN UA: 0.2 mg/dL (ref 0.0–1.0)

## 2013-12-28 LAB — CBC WITH DIFFERENTIAL/PLATELET
BASOS PCT: 0 % (ref 0–1)
Basophils Absolute: 0 10*3/uL (ref 0.0–0.1)
EOS PCT: 1 % (ref 0–5)
Eosinophils Absolute: 0.2 10*3/uL (ref 0.0–0.7)
HCT: 32.4 % — ABNORMAL LOW (ref 36.0–46.0)
Hemoglobin: 10.7 g/dL — ABNORMAL LOW (ref 12.0–15.0)
Lymphocytes Relative: 23 % (ref 12–46)
Lymphs Abs: 3 10*3/uL (ref 0.7–4.0)
MCH: 27.9 pg (ref 26.0–34.0)
MCHC: 33 g/dL (ref 30.0–36.0)
MCV: 84.4 fL (ref 78.0–100.0)
MONO ABS: 0.6 10*3/uL (ref 0.1–1.0)
Monocytes Relative: 4 % (ref 3–12)
Neutro Abs: 9.5 10*3/uL — ABNORMAL HIGH (ref 1.7–7.7)
Neutrophils Relative %: 72 % (ref 43–77)
Platelets: 354 10*3/uL (ref 150–400)
RBC: 3.84 MIL/uL — ABNORMAL LOW (ref 3.87–5.11)
RDW: 14.5 % (ref 11.5–15.5)
WBC: 13.2 10*3/uL — ABNORMAL HIGH (ref 4.0–10.5)

## 2013-12-28 LAB — OCCULT BLOOD, POC DEVICE: FECAL OCCULT BLD: POSITIVE — AB

## 2013-12-28 LAB — TROPONIN I

## 2013-12-28 LAB — COMPREHENSIVE METABOLIC PANEL
ALBUMIN: 3.9 g/dL (ref 3.5–5.2)
ALT: 8 U/L (ref 0–35)
AST: 12 U/L (ref 0–37)
Alkaline Phosphatase: 93 U/L (ref 39–117)
BILIRUBIN TOTAL: 0.3 mg/dL (ref 0.3–1.2)
BUN: 40 mg/dL — AB (ref 6–23)
CO2: 21 mEq/L (ref 19–32)
Calcium: 9.8 mg/dL (ref 8.4–10.5)
Chloride: 103 mEq/L (ref 96–112)
Creatinine, Ser: 3.21 mg/dL — ABNORMAL HIGH (ref 0.50–1.10)
GFR calc non Af Amer: 13 mL/min — ABNORMAL LOW (ref >90)
GFR, EST AFRICAN AMERICAN: 15 mL/min — AB (ref >90)
Glucose, Bld: 106 mg/dL — ABNORMAL HIGH (ref 70–99)
Potassium: 4.3 mEq/L (ref 3.7–5.3)
Sodium: 141 mEq/L (ref 137–147)
Total Protein: 8.3 g/dL (ref 6.0–8.3)

## 2013-12-28 LAB — URINE MICROSCOPIC-ADD ON

## 2013-12-28 LAB — CBC
HEMATOCRIT: 31.1 % — AB (ref 36.0–46.0)
HEMOGLOBIN: 10.3 g/dL — AB (ref 12.0–15.0)
MCH: 27.8 pg (ref 26.0–34.0)
MCHC: 33.1 g/dL (ref 30.0–36.0)
MCV: 83.8 fL (ref 78.0–100.0)
Platelets: 341 10*3/uL (ref 150–400)
RBC: 3.71 MIL/uL — ABNORMAL LOW (ref 3.87–5.11)
RDW: 14.6 % (ref 11.5–15.5)
WBC: 12.7 10*3/uL — AB (ref 4.0–10.5)

## 2013-12-28 LAB — TYPE AND SCREEN
ABO/RH(D): A POS
Antibody Screen: NEGATIVE

## 2013-12-28 LAB — PROTIME-INR
INR: 0.93 (ref 0.00–1.49)
Prothrombin Time: 12.3 seconds (ref 11.6–15.2)

## 2013-12-28 LAB — CG4 I-STAT (LACTIC ACID): LACTIC ACID, VENOUS: 0.91 mmol/L (ref 0.5–2.2)

## 2013-12-28 LAB — ABO/RH: ABO/RH(D): A POS

## 2013-12-28 LAB — LIPASE, BLOOD: LIPASE: 67 U/L — AB (ref 11–59)

## 2013-12-28 MED ORDER — SODIUM CHLORIDE 0.9 % IJ SOLN
3.0000 mL | Freq: Two times a day (BID) | INTRAMUSCULAR | Status: DC
  Administered 2013-12-28 – 2013-12-30 (×3): 3 mL via INTRAVENOUS

## 2013-12-28 MED ORDER — PIPERACILLIN-TAZOBACTAM 3.375 G IVPB 30 MIN
3.3750 g | Freq: Once | INTRAVENOUS | Status: AC
  Administered 2013-12-28: 3.375 g via INTRAVENOUS
  Filled 2013-12-28: qty 50

## 2013-12-28 MED ORDER — NITROGLYCERIN 0.4 MG SL SUBL: 0.4000 mg | SUBLINGUAL_TABLET | SUBLINGUAL | Status: DC | PRN

## 2013-12-28 MED ORDER — VANCOMYCIN HCL IN DEXTROSE 1-5 GM/200ML-% IV SOLN
1000.0000 mg | Freq: Once | INTRAVENOUS | Status: AC
  Administered 2013-12-28: 1000 mg via INTRAVENOUS
  Filled 2013-12-28: qty 200

## 2013-12-28 MED ORDER — VANCOMYCIN HCL IN DEXTROSE 750-5 MG/150ML-% IV SOLN
750.0000 mg | INTRAVENOUS | Status: DC
  Administered 2013-12-30: 750 mg via INTRAVENOUS
  Filled 2013-12-28: qty 150

## 2013-12-28 MED ORDER — IOHEXOL 300 MG/ML  SOLN
20.0000 mL | INTRAMUSCULAR | Status: AC
  Administered 2013-12-28: 20 mL via ORAL

## 2013-12-28 MED ORDER — SODIUM CHLORIDE 0.9 % IV SOLN
INTRAVENOUS | Status: DC
  Administered 2013-12-28: 19:00:00 via INTRAVENOUS
  Administered 2013-12-28 – 2013-12-29 (×2): 100 mL/h via INTRAVENOUS
  Administered 2013-12-29: 17:00:00 via INTRAVENOUS
  Administered 2013-12-30: 50 mL/h via INTRAVENOUS

## 2013-12-28 MED ORDER — NITROGLYCERIN 0.4 MG SL SUBL
SUBLINGUAL_TABLET | SUBLINGUAL | Status: AC
  Filled 2013-12-28: qty 25

## 2013-12-28 MED ORDER — PANTOPRAZOLE SODIUM 40 MG IV SOLR
40.0000 mg | Freq: Two times a day (BID) | INTRAVENOUS | Status: DC
  Administered 2013-12-28 – 2013-12-29 (×2): 40 mg via INTRAVENOUS
  Filled 2013-12-28 (×3): qty 40

## 2013-12-28 MED ORDER — HYDRALAZINE HCL 20 MG/ML IJ SOLN
5.0000 mg | INTRAMUSCULAR | Status: DC | PRN
  Administered 2013-12-29: 5 mg via INTRAVENOUS

## 2013-12-28 MED ORDER — SODIUM CHLORIDE 0.9 % IV SOLN: INTRAVENOUS | Status: DC

## 2013-12-28 NOTE — ED Provider Notes (Signed)
Medical screening examination/treatment/procedure(s) were performed by non-physician practitioner and as supervising physician I was immediately available for consultation/collaboration.  EKG Interpretation   None         Gilda Creasehristopher J. Quan Cybulski, MD 12/28/13 1329

## 2013-12-28 NOTE — ED Provider Notes (Signed)
Chief Complaint   Chief Complaint  Patient presents with  . Abdominal Pain    History of Present Illness    Monica [name] is a 80 year old female with hypertension who presents with chest pain, abdominal pain, black stools, and a boil on her right buttock.  1. Chest pain: She's had chest pain off-and-on for a year. She's not sure whether she has ever been diagnosed with cardiac disease, but she has taken nitroglycerin for this in the past. She's had more frequent pains since last night. Episodes come and go and last 15-20 minutes at a time. They are nonexertional. The pain is left pectoral, feels like a cramping pain, and rated 6/10 in intensity. They do not radiate. She has had some nausea and vomiting throughout the past several days and has chronic sweats and shortness of breath. She denies any coughing, wheezing, palpitations, dizziness, syncope, or fever.  2. Abdominal pain: She's also had a two-day history of intermittent epigastric pain. Episodes come and go. They're not related to eating or to meals. She's felt nauseated and vomited twice. No hematemesis, coffee-ground emesis, or bilious emesis. The patient has no history of ulcer disease or gastritis. No history of gallbladder disease. The patient states she's not sleeping well at night, feels stressed, anxious, and had some nocturia. Over the past 2 weeks she's been constipated and her stools have been black. She has not been taking Pepto-Bismol or iron pills.  3. Abscess: She has had a one-week history of an abscess on her right buttock. This is been draining some pus and the patient states she's never had anything like this before. She denies any fever or chills.  Review of Systems    Other than noted above, the patient denies any of the following symptoms. Systemic:  No fever, chills, sweats, or fatigue. ENT:  No nasal congestion, rhinorrhea, or sore throat. Pulmonary:  No cough, wheezing, shortness of breath, sputum production,  hemoptysis. Cardiac:  No palpitations, rapid heartbeat, dizziness, presyncope or syncope. GI:  No abdominal pain, heartburn, nausea, or vomiting. Ext:  No leg pain or swelling.  PMFSH    Past medical history, family history, social history, meds, and allergies were reviewed and updated as needed. She is allergic to aspirin codeine. She has a history of hypertension, chronic kidney disease, alcoholism, gastroesophageal reflux, atrial flutter, hemorrhoids, and dementia.  Physical Exam     Vital signs:  There were no vitals taken for this visit. Gen:  Alert, oriented, in no distress, skin warm and dry. Eye:  PERRL, lids and conjunctivas normal.  Sclera non-icteric. ENT:  Mucous membranes moist, pharynx clear. Neck:  Supple, no adenopathy or tenderness.  No JVD. Lungs:  Clear to auscultation, no wheezes, rales or rhonchi.  No respiratory distress. Heart:  Regular rhythm.  No gallops, murmers, clicks or rubs. Chest:  No chest wall tenderness. Abdomen:  There is tenderness to palpation in the epigastrium with guarding, no organomegaly or mass, bowel sounds are hyperactive.  No pulsatile abdominal mass or bruit. Rectal exam: No masses, no tenderness, heme positive stool. Ext:  No edema.  No calf tenderness and Homann's sign negative.  Pulses full and equal. Skin:  Warm and dry.  No rash. Exam of the right buttock reveals a tender, raised, 2.5 x 2.5 cm firm nodule in the right buttock with a central sinus tract which was not draining any pus. This felt firm and nonfluctuant.   Electrocardiogram     Date: 12/28/2013  Rate: 87  Rhythm: normal sinus rhythm  QRS Axis: normal  Intervals: normal  ST/T Wave abnormalities: normal  Conduction Disutrbances:none  Narrative Interpretation: Normal sinus rhythm, abnormal QRS-T vector angle, of uncertain significance.  Old EKG Reviewed: none available  Course in Urgent Care Center   She was begun on normal saline IV at 50 mL per hour and given  nitroglycerin 0.4 mg sublingually. She was not given aspirin since she has an allergy to this, and also since there is a question of GI bleeding.  Assessment     The primary encounter diagnosis was Chest pain. Diagnoses of Epigastric pain, Melena, Heme positive stool, and Abscess were also pertinent to this visit.  Plan     The patient was transferred to the ED via [facility] in stable condition.  Medical Decision Making     80 year old female presents today with 2 day history of intermittent left pectoral chest pain.  Episodes of pain last 10 to 15 minutes.  Unrelated to position, exertion, activity or meals.  She has chronic shortness of breath and sweats.  Over past 2 days she has felt nauseated and vomited twice.  No known cardiac disease, but she has taken TNG in the past, she's not sure why.  She also has a 2 day history of epigastric pain, nausea, vomiting, and melena.  No fever or diarrhea.  No hematemesis.  No history of ulcer or gallbladder disease.  She also has a 1 week history of a painful bump on her right buttock which has been draining.  On exam she is tender in the epigastrium with guarding.  Stool is strongly heme positive.  She is being transferred because of chest pain suggestive of angina, she also has epigastric pain and heme pos stools, finally, she has an abscess on her right buttock.      Reuben Likesavid C Lashonta Pilling, MD 12/28/13 [phone]

## 2013-12-28 NOTE — ED Provider Notes (Signed)
I received care from Dr. Briant SitesPaolina. CT shows no acute processes. Hemoglobin and vitals are stable. Had incision and drainage of a perirectal abscess. Guaiac positive stool. Care discussed by myself and the physician from try hospitalist. She'll be admitted. I have placed a call to general surgery regarding consultation regarding ongoing care of her perirectal abscess status post incision and drainage in the emergency room.  Monica PorterMark Karington Zarazua, MD 12/28/13 1816

## 2013-12-28 NOTE — Consult Note (Signed)
Reason for Consult: Perirectal abscess Referring Physician: Dr. Meredith Staggers Monica [name] is an 80 y.o. female.  HPI:  Patient is a 80 year old female who comes in with upper abdominal pain, vomiting, and black stools. She also complains of perirectal pain.  She describes the area on her right buttock as being a boil. She thinks it started around a week ago. It initially started to drain but she states it started to become larger in the past few days.  She has felt hot but has not taken her temperature at home.  In the ED, she has had low-grade temperatures.  She is being admitted to medicine for care of her upper GI bleed. We are requested to consult regarding her perirectal abscess.  Patient has a history of alcoholism.  Past Medical History  Diagnosis Date  . Hypertension   . Renal insufficiency   . ETOHism   . GERD (gastroesophageal reflux disease)   . Arthritis   . Atrial flutter   . Chronic renal failure   . Hemorrhoids   . S/P appy   . S/P tubal ligation   . Dementia     History reviewed. No pertinent past surgical history.  No family history on file.  Social History:  reports that she has quit smoking. She does not have any smokeless tobacco history on file. She reports that she does not drink alcohol. Her drug history is not on file.  Allergies:  Allergies  Allergen Reactions  . Aspirin Other (See Comments)    "makes my stomach hurt"  . Codeine Other (See Comments)    "i get irritated"    Medications:  Prior to Admission:  Prescriptions prior to admission  Medication Sig Dispense Refill  . acetaminophen (TYLENOL) 500 MG tablet Take 1,000 mg by mouth every 6 (six) hours as needed for mild pain.      . Tetrahydrozoline HCl (VISINE OP) Place 2 drops into both eyes daily as needed (dry eyes).        Results for orders placed during the hospital encounter of 12/28/13 (from the past 48 hour(s))  URINALYSIS, ROUTINE W REFLEX MICROSCOPIC     Status: Abnormal   Collection  Time    12/28/13 12:36 PM      Result Value Range   Color, Urine YELLOW  YELLOW   APPearance CLOUDY (*) CLEAR   Specific Gravity, Urine 1.014  1.005 - 1.030   pH 5.5  5.0 - 8.0   Glucose, UA NEGATIVE  NEGATIVE mg/dL   Hgb urine dipstick SMALL (*) NEGATIVE   Bilirubin Urine NEGATIVE  NEGATIVE   Ketones, ur NEGATIVE  NEGATIVE mg/dL   Protein, ur 100 (*) NEGATIVE mg/dL   Urobilinogen, UA 0.2  0.0 - 1.0 mg/dL   Nitrite NEGATIVE  NEGATIVE   Leukocytes, UA TRACE (*) NEGATIVE  URINE MICROSCOPIC-ADD ON     Status: Abnormal   Collection Time    12/28/13 12:36 PM      Result Value Range   Squamous Epithelial / LPF FEW (*) RARE   WBC, UA 11-20  <3 WBC/hpf   RBC / HPF 0-2  <3 RBC/hpf   Bacteria, UA MANY (*) RARE  CBC WITH DIFFERENTIAL     Status: Abnormal   Collection Time    12/28/13 12:50 PM      Result Value Range   WBC 13.2 (*) 4.0 - 10.5 K/uL   RBC 3.84 (*) 3.87 - 5.11 MIL/uL   Hemoglobin 10.7 (*) 12.0 - 15.0 g/dL  HCT 32.4 (*) 36.0 - 46.0 %   MCV 84.4  78.0 - 100.0 fL   MCH 27.9  26.0 - 34.0 pg   MCHC 33.0  30.0 - 36.0 g/dL   RDW 14.5  11.5 - 15.5 %   Platelets 354  150 - 400 K/uL   Neutrophils Relative % 72  43 - 77 %   Neutro Abs 9.5 (*) 1.7 - 7.7 K/uL   Lymphocytes Relative 23  12 - 46 %   Lymphs Abs 3.0  0.7 - 4.0 K/uL   Monocytes Relative 4  3 - 12 %   Monocytes Absolute 0.6  0.1 - 1.0 K/uL   Eosinophils Relative 1  0 - 5 %   Eosinophils Absolute 0.2  0.0 - 0.7 K/uL   Basophils Relative 0  0 - 1 %   Basophils Absolute 0.0  0.0 - 0.1 K/uL  COMPREHENSIVE METABOLIC PANEL     Status: Abnormal   Collection Time    12/28/13 12:50 PM      Result Value Range   Sodium 141  137 - 147 mEq/L   Potassium 4.3  3.7 - 5.3 mEq/L   Chloride 103  96 - 112 mEq/L   CO2 21  19 - 32 mEq/L   Glucose, Bld 106 (*) 70 - 99 mg/dL   BUN 40 (*) 6 - 23 mg/dL   Creatinine, Ser 3.21 (*) 0.50 - 1.10 mg/dL   Calcium 9.8  8.4 - 10.5 mg/dL   Total Protein 8.3  6.0 - 8.3 g/dL   Albumin 3.9  3.5  - 5.2 g/dL   AST 12  0 - 37 U/L   ALT 8  0 - 35 U/L   Alkaline Phosphatase 93  39 - 117 U/L   Total Bilirubin 0.3  0.3 - 1.2 mg/dL   GFR calc non Af Amer 13 (*) >90 mL/min   GFR calc Af Amer 15 (*) >90 mL/min   Comment: (NOTE)     The eGFR has been calculated using the CKD EPI equation.     This calculation has not been validated in all clinical situations.     eGFR's persistently <90 mL/min signify possible Chronic Kidney     Disease.  TROPONIN I     Status: None   Collection Time    12/28/13 12:50 PM      Result Value Range   Troponin I <0.30  <0.30 ng/mL   Comment:            Due to the release kinetics of cTnI,     a negative result within the first hours     of the onset of symptoms does not rule out     myocardial infarction with certainty.     If myocardial infarction is still suspected,     repeat the test at appropriate intervals.  PROTIME-INR     Status: None   Collection Time    12/28/13 12:50 PM      Result Value Range   Prothrombin Time 12.3  11.6 - 15.2 seconds   INR 0.93  0.00 - 1.49  LIPASE, BLOOD     Status: Abnormal   Collection Time    12/28/13 12:50 PM      Result Value Range   Lipase 67 (*) 11 - 59 U/L  TYPE AND SCREEN     Status: None   Collection Time    12/28/13  1:00 PM      Result Value Range  ABO/RH(D) A POS     Antibody Screen NEG     Sample Expiration 12/31/2013    ABO/RH     Status: None   Collection Time    12/28/13  1:00 PM      Result Value Range   ABO/RH(D) A POS    CG4 I-STAT (LACTIC ACID)     Status: None   Collection Time    12/28/13  1:10 PM      Result Value Range   Lactic Acid, Venous 0.91  0.5 - 2.2 mmol/L  OCCULT BLOOD, POC DEVICE     Status: Abnormal   Collection Time    12/28/13  1:47 PM      Result Value Range   Fecal Occult Bld POSITIVE (*) NEGATIVE  CBC     Status: Abnormal   Collection Time    12/28/13  6:56 PM      Result Value Range   WBC 12.7 (*) 4.0 - 10.5 K/uL   RBC 3.71 (*) 3.87 - 5.11 MIL/uL    Hemoglobin 10.3 (*) 12.0 - 15.0 g/dL   HCT 31.1 (*) 36.0 - 46.0 %   MCV 83.8  78.0 - 100.0 fL   MCH 27.8  26.0 - 34.0 pg   MCHC 33.1  30.0 - 36.0 g/dL   RDW 14.6  11.5 - 15.5 %   Platelets 341  150 - 400 K/uL    Ct Abdomen Pelvis Wo Contrast  12/28/2013   CLINICAL DATA:  Mid upper abdominal pain, perianal/rectal pain, abscess  EXAM: CT ABDOMEN AND PELVIS WITHOUT CONTRAST  TECHNIQUE: Multidetector CT imaging of the abdomen and pelvis was performed following the standard protocol without intravenous contrast.  COMPARISON:  CT abdomen pelvis - 08/22/2011  FINDINGS: The lack of intravenous contrast limits the ability to evaluate solid abdominal organs.  Unchanged punctate calcifications within the liver, presumably the sequela of prior granulomatous infection. Normal appearance of the gallbladder. No ascites.  The bilateral kidneys appear atrophic. There is a suspected approximately 1.3 cm hypo attenuating partially exophytic lesion arising from the superior lateral aspect of the right kidney (image 27, series 5) which appears morphologically unchanged since the 2012 examination no is incompletely evaluated on the present examination. There are multiple nonobstructing renal stones seen bilaterally with dominant nonobstructing stone within the interpolar region of the left kidney measuring approximately 0.7 cm in diameter (coronal image 61) and dominant nonobstructing stone within the superior pole of the right kidney measuring approximately 0.4 cm (coronal image 68). No definite renal stones are seen along the expected course of either ureter or the urinary bladder. Normal noncontrast appearance of the urinary bladder given degree distention. Multiple phleboliths overlie the lower pelvis. Minimal amount of likely age-related perinephric stranding. No urinary obstruction.  Normal noncontrast appearance of the bilateral adrenal gland glands and spleen. Grossly unchanged appearance of approximately 1.7 x 1.4 cm  hypo attenuating lesion within the posterior aspect of the pancreatic head (image 32, series 5).  Ingested enteric contrast extends to the level of the distal small bowel. No evidence of enteric obstruction. Colonic diverticulosis without evidence of diverticulitis. There is no definitive rectal wall thickening on this noncontrast examination. No pneumoperitoneum, pneumatosis or portal venous gas. The appendix is not visualized, however there is moderate amount of calcified atherosclerotic plaque within a normal caliber abdominal aorta. Scattered retroperitoneal lymph nodes individually not enlarged by size criteria. No retroperitoneal, mesenteric, pelvic or inguinal lymphadenopathy. No inflammatory change within the right lower abdominal quadrant.  Post  hysterectomy. No discrete adnexal lesion. No free fluid within the pelvis.  Limited visualization of the lower thorax is negative focal airspace opacity or pleural effusion.  Normal heart size.  No pericardial effusion.  No acute or aggressive osseous abnormalities. Mild to moderate multilevel lumbar spine DDD, likely worse at L3-L4 and L4-L5 with disc space height loss, endplate irregularity and small posteriorly directed disc osteophyte complexes at these locations.  There is ill-defined soft tissue stranding about the medial aspect of the right buttocks measuring approximately 2.1 x 2.5 cm (image 79, series 5) without definitive definable/drainable fluid collection on this noncontrast examination.  IMPRESSION: 1. Ill-defined soft tissue stranding about the medial aspect of the right buttocks without associated definitive definable/drainable fluid collection on this noncontrast examination. Clinical correlation is advised. 2. Colonic diverticulosis without evidence of diverticulitis. No definite evidence of definitive rectal wall thickening on this noncontrast examination. 3. Interval progression of bilateral nonobstructing nephrolithiasis. 4. Suspected  approximately 1.3 cm hypo attenuating partially exophytic lesion arising from the superior pole of the right kidney is morphologically unchanged since the 2012 examination and thus favored to represent a renal cyst. 5. The approximately 1.7 cm hypo attenuating lesion within the posterior aspect of the pancreatic head is incompletely evaluated on this noncontrast examination though morphologically unchanged since the 09/2010 examination and thus favored to represent a benign pancreatic cystic lesion.   Electronically Signed   By: Sandi Mariscal M.D.   On: 12/28/2013 16:58    Review of Systems  Constitutional: Negative.   HENT: Negative.   Eyes: Negative.   Respiratory: Negative.   Cardiovascular: Negative.   Gastrointestinal: Positive for nausea, vomiting and melena.  Genitourinary:       Buttock pain  Musculoskeletal: Negative.   Skin: Negative.   Neurological: Negative.   Endo/Heme/Allergies: Negative.   Psychiatric/Behavioral: Positive for substance abuse.   Blood pressure 149/84, pulse 95, temperature 99 F (37.2 C), temperature source Oral, resp. rate 20, height 5' (1.524 m), weight 160 lb 4.4 oz (72.7 kg), SpO2 99.00%. Physical Exam  Constitutional: She is oriented to person, place, and time. She appears well-developed and well-nourished. No distress.  HENT:  Head: Normocephalic and atraumatic.  Eyes: Conjunctivae are normal. Pupils are equal, round, and reactive to light. No scleral icterus.  Neck: Normal range of motion. No tracheal deviation present.  Cardiovascular: Normal rate, regular rhythm, normal heart sounds and intact distal pulses.  Exam reveals no gallop and no friction rub.   No murmur heard. Respiratory: Breath sounds normal. No respiratory distress. She has no wheezes. She has no rales. She exhibits no tenderness.  GI: Soft. She exhibits no distension. There is no tenderness. There is no rebound and no guarding.  Genitourinary:     Small 8-10 mm incision with some  purulent drainage.  No packing present.  Around 2-3 cm induration.  No cellulitis.  + tenderness.    Musculoskeletal: She exhibits no edema.  Neurological: She is alert and oriented to person, place, and time. Coordination normal.  Skin: Skin is warm and dry. No rash noted. She is not diaphoretic. No erythema. No pallor.  Psychiatric: She has a normal mood and affect. Her behavior is normal. Judgment and thought content normal.    Assessment/Plan: Buttock abscess The patient has minimal induration. I would not keep her on broad-spectrum antibiotics for a long period of time. The surgery team will reevaluate tomorrow. The wound does appear to be draining. It should continue to improve. It is possible that she  may not improve and may need to have the wound opened more at the bedside.  Becky Colan 12/28/2013, 10:33 PM

## 2013-12-28 NOTE — ED Notes (Signed)
Lactic acid results shown to the primary nurse Raiford Nobleick

## 2013-12-28 NOTE — ED Notes (Signed)
IV started attempted x 1 by this writer, once by Jerelyn Charles Bowles, RN Called main ED , spoke w [place], CN, to advise of pending transfer for further evaluation. Called [facility] to request transport

## 2013-12-28 NOTE — ED Notes (Addendum)
Daughter ([name]) called and left a contact number for pt's disposition.

## 2013-12-28 NOTE — ED Notes (Signed)
Pt transferred here from Urgent Care with complaint of ABD pain since last night. Pt states she also has some "twinges" of pain in her upper left chest near her shoulder.  Pt advises she is pain free currently.

## 2013-12-28 NOTE — Progress Notes (Signed)
ANTIBIOTIC CONSULT NOTE - INITIAL  Pharmacy Consult for vancomycin Indication: abscess  Allergies  Allergen Reactions  . Aspirin Other (See Comments)    "makes my stomach hurt"  . Codeine Other (See Comments)    "i get irritated"    Patient Measurements: Height: 5\' 1"  (154.9 cm) IBW/kg (Calculated) : 47.8 Adjusted Body Weight:   Vital Signs: Temp: 99.5 F (37.5 C) (01/21 1223) Temp src: Oral (01/21 1223) BP: 170/84 mmHg (01/21 1531) Pulse Rate: 88 (01/21 1531) Intake/Output from previous day:   Intake/Output from this shift:    Labs:  Recent Labs  12/28/13 1250  WBC 13.2*  HGB 10.7*  PLT 354  CREATININE 3.21*   The CrCl is unknown because both a height and weight (above a minimum accepted value) are required for this calculation. No results found for this basename: VANCOTROUGH, VANCOPEAK, VANCORANDOM, GENTTROUGH, GENTPEAK, GENTRANDOM, TOBRATROUGH, TOBRAPEAK, TOBRARND, AMIKACINPEAK, AMIKACINTROU, AMIKACIN,  in the last 72 hours   Microbiology: No results found for this or any previous visit (from the past 720 hour(s)).  Medical History: Past Medical History  Diagnosis Date  . Hypertension   . Renal insufficiency   . ETOHism   . GERD (gastroesophageal reflux disease)   . Arthritis   . Atrial flutter   . Chronic renal failure   . Hemorrhoids   . S/P appy   . S/P tubal ligation   . Dementia     Medications:  Anti-infectives   Start     Dose/Rate Route Frequency Ordered Stop   12/30/13 1800  vancomycin (VANCOCIN) IVPB 750 mg/150 ml premix     750 mg 150 mL/hr over 60 Minutes Intravenous Every 48 hours 12/28/13 1643     12/28/13 1645  vancomycin (VANCOCIN) IVPB 1000 mg/200 mL premix     1,000 mg 200 mL/hr over 60 Minutes Intravenous  Once 12/28/13 1643       Assessment: 81 yof presented to the ED with abdominal pain. Found to have a perianal abscess which was I&D'd today. Pt is afebrile and WBC is 13.2. Known hx of CKD with Scr of 3.21. To start  vancomycin empirically. Pt was unsure of how much she weighed.   Vanc 1/21>>  Goal of Therapy:  Vancomycin trough level 10-15 mcg/ml  Plan:  1. Vancomycin 1gm IV x 1 then 750mg  IV Q48H 2. F/u renal fxn, C&S, clinical status and trough at SS 3. F/u inpatient measured weight  Monica [name], Monica [name] 12/28/2013,4:44 PM

## 2013-12-28 NOTE — ED Provider Notes (Signed)
CSN: [id number]     Arrival date & time 12/28/13  1208 History   First MD Initiated Contact with Patient 12/28/13 1213     Chief Complaint  Patient presents with  . Abdominal Pain   (Consider location/radiation/quality/duration/timing/severity/associated sxs/prior Treatment) HPI Comments: Patient referred to the emergency department from urgent care for further evaluation. Patient presented with multiple complaints. Patient is primarily seen for abdominal pain. She reports that she has been having 2 days of upper abdominal pain. Pain waxes and wanes, at times is severe. She has not identified any precipitating or alleviating factors. She has had a couple of episodes of vomiting, no blood or coffee grounds. She has, however, noticed that her stool has been black for the last several days.  Patient also was evaluated for chest pain. This has been intermittent. This has been a chronic intermittent problem for her. She is not expressing any current chest pain there is no shortness of breath.  Patient also reports that she has a boil on her buttock. This started approximately a week ago. He has been intermittently draining, but stopped a couple of days ago and now has become larger and more painful.  Patient is a 80 y.o. female presenting with abdominal pain.  Abdominal Pain Associated symptoms: chest pain     Past Medical History  Diagnosis Date  . Hypertension   . Renal insufficiency   . ETOHism   . GERD (gastroesophageal reflux disease)   . Arthritis   . Atrial flutter   . Chronic renal failure   . Hemorrhoids   . S/P appy   . S/P tubal ligation   . Dementia    History reviewed. No pertinent past surgical history. No family history on file. History  Substance Use Topics  . Smoking status: Former Games developer  . Smokeless tobacco: Not on file  . Alcohol Use: No     Comment: former drinker   OB History   Grav Para Term Preterm Abortions TAB SAB Ect Mult Living                  Review of Systems  Cardiovascular: Positive for chest pain.  Gastrointestinal: Positive for abdominal pain and rectal pain.  All other systems reviewed and are negative.    Allergies  Aspirin and Codeine  Home Medications   Current Outpatient Rx  Name  Route  Sig  Dispense  Refill  . acetaminophen (TYLENOL) 500 MG tablet   Oral   Take 1,000 mg by mouth every 6 (six) hours as needed for mild pain.         . Tetrahydrozoline HCl (VISINE OP)   Both Eyes   Place 2 drops into both eyes daily as needed (dry eyes).          BP 170/84  Pulse 88  Temp(Src) 99.5 F (37.5 C) (Oral)  Resp 18  Ht 5\' 1"  (1.549 m)  SpO2 100% Physical Exam  Constitutional: She is oriented to person, place, and time. She appears well-developed and well-nourished. No distress.  HENT:  Head: Normocephalic and atraumatic.  Right Ear: Hearing normal.  Left Ear: Hearing normal.  Nose: Nose normal.  Mouth/Throat: Oropharynx is clear and moist and mucous membranes are normal.  Eyes: Conjunctivae and EOM are normal. Pupils are equal, round, and reactive to light.  Neck: Normal range of motion. Neck supple.  Cardiovascular: Regular rhythm, S1 normal and S2 normal.  Exam reveals no gallop and no friction rub.   No murmur heard.  Pulmonary/Chest: Effort normal and breath sounds normal. No respiratory distress. She exhibits no tenderness.  Abdominal: Soft. Normal appearance and bowel sounds are normal. There is no hepatosplenomegaly. There is generalized tenderness. There is no rebound, no guarding, no tenderness at McBurney's point and negative Murphy's sign. No hernia.  Musculoskeletal: Normal range of motion.  Neurological: She is alert and oriented to person, place, and time. She has normal strength. No cranial nerve deficit or sensory deficit. Coordination normal. GCS eye subscore is 4. GCS verbal subscore is 5. GCS motor subscore is 6.  Skin: Skin is warm, dry and intact. No rash noted. No cyanosis.      Psychiatric: She has a normal mood and affect. Her speech is normal and behavior is normal. Thought content normal.    ED Course  Procedures (including critical care time) Labs Review Labs Reviewed  CBC WITH DIFFERENTIAL - Abnormal; Notable for the following:    WBC 13.2 (*)    RBC 3.84 (*)    Hemoglobin 10.7 (*)    HCT 32.4 (*)    Neutro Abs 9.5 (*)    All other components within normal limits  COMPREHENSIVE METABOLIC PANEL - Abnormal; Notable for the following:    Glucose, Bld 106 (*)    BUN 40 (*)    Creatinine, Ser 3.21 (*)    GFR calc non Af Amer 13 (*)    GFR calc Af Amer 15 (*)    All other components within normal limits  URINALYSIS, ROUTINE W REFLEX MICROSCOPIC - Abnormal; Notable for the following:    APPearance CLOUDY (*)    Hgb urine dipstick SMALL (*)    Protein, ur 100 (*)    Leukocytes, UA TRACE (*)    All other components within normal limits  URINE MICROSCOPIC-ADD ON - Abnormal; Notable for the following:    Squamous Epithelial / LPF FEW (*)    Bacteria, UA MANY (*)    All other components within normal limits  LIPASE, BLOOD - Abnormal; Notable for the following:    Lipase 67 (*)    All other components within normal limits  OCCULT BLOOD, POC DEVICE - Abnormal; Notable for the following:    Fecal Occult Bld POSITIVE (*)    All other components within normal limits  URINE CULTURE  TROPONIN I  PROTIME-INR  CG4 I-STAT (LACTIC ACID)  TYPE AND SCREEN  ABO/RH   Imaging Review No results found.  EKG Interpretation    Date/Time:  Wednesday December 28 2013 12:22:19 EST Ventricular Rate:  78 PR Interval:  188 QRS Duration: 85 QT Interval:  403 QTC Calculation: 459 R Axis:   18 Text Interpretation:  Sinus rhythm Borderline repolarization abnormality Minimal ST elevation, inferior leads Baseline wander in lead(s) V2 No significant change since last tracing Confirmed by Pius Byrom  MD, Daniele Yankowski ([id number]) on 12/28/2013 2:16:21 PM            MDM   Diagnosis: 1. Perianal/perirectal abscess 2. Abdominal pain 3. Upper GI bleed  Patient presents to the ER for evaluation of multiple complaints. She did have a sizable abscess in the right buttock/perirectal area. This was drained by Durene Cal, PA-C. See separate note for procedure. This was performed under my supervision.  I did perform a rectal exam after the patient had her abscess drained. I did still feel some fullness on the right side of the rectum, possibly persistent loss. Because of this as well as the patient's abdominal discomfort, CAT scan was performed. It is  still pending.  Patient also exhibiting signs and symptoms of upper GI bleed. She has been passing black stool and was heme positive today.  Case signed out to Dr. Fayrene FearingJames - follow up CT scan and disposition patient.    Gilda Creasehristopher J. Jovin Fester, MD 12/28/13 1600

## 2013-12-28 NOTE — ED Notes (Signed)
Pt given ice chips per Dr.James

## 2013-12-28 NOTE — H&P (Signed)
Hospitalist Admission History and Physical  Patient name: Monica [name] Medical record number: [id number] Date of birth: [birth year] Age: 80 y.o. Gender: female  Primary Care Provider: No PCP Per Patient  Chief Complaint: GIB, R buttock abscess  History of Present Illness:This is a 80 y.o. year old female with multiple medical problems including HTN, CRF ( ? Baseline cr around 2.6), hx/o  ETOHism presenting with black tarry stools. Pt states that she has had black stools over the last 2 weeks. Pt denies any ASA or NSAID use. No ETOh use per pt. Denies any BRBPR. No nausea or vomiting. Denies any strenuous activity. Reports a remote hx/o of this in the past, though pt is unsure. Pt also reports L buttock pain over past 4-5 days. Has had progressive tenderness over palpable area. No fevers or chills. Denies any fevers or chills. Fairly limited history as pt reports hx/o dementia, though pt lives alone and performs ADLs indepenedently.  In the ER, pt noted to be hemoccult +. WBC @ 13. Hgb @ 10.7. Had CT abd and pelvis that showed no acute findings. Did have some chronic stable findings including diverticulosis w/o diverticulitis,  R kidney exophytic cyst (1.3cm) and stable 1.7 pancreatic head cyst/lesion. Urinalysis mildly indicative of infection. R buttock abscess I and D'd at bedside by EDP. Cxs obtained. Pt started on vanc and zosyn. Blood and urine cxs obtained. Surgery also consulted per EDP.    Patient Active Problem List   Diagnosis Date Noted  . Upper GI bleed 12/28/2013  . GIB (gastrointestinal bleeding) 12/28/2013   Past Medical History: Past Medical History  Diagnosis Date  . Hypertension   . Renal insufficiency   . ETOHism   . GERD (gastroesophageal reflux disease)   . Arthritis   . Atrial flutter   . Chronic renal failure   . Hemorrhoids   . S/P appy   . S/P tubal ligation   . Dementia     Past Surgical History: History reviewed. No pertinent past surgical history.  Social  History: History   Social History  . Marital Status: Married    Spouse Name: N/A    Number of Children: N/A  . Years of Education: N/A   Social History Main Topics  . Smoking status: Former Games developer  . Smokeless tobacco: None  . Alcohol Use: No     Comment: former drinker  . Drug Use: None  . Sexual Activity: None   Other Topics Concern  . None   Social History Narrative  . None    Family History: No family history on file.  Allergies: Allergies  Allergen Reactions  . Aspirin Other (See Comments)    "makes my stomach hurt"  . Codeine Other (See Comments)    "i get irritated"    Current Facility-Administered Medications  Medication Dose Route Frequency Provider Last Rate Last Dose  . 0.9 %  sodium chloride infusion   Intravenous Continuous Doree Albee, MD      . piperacillin-tazobactam (ZOSYN) IVPB 3.375 g  3.375 g Intravenous Once Rolland Porter, MD      . sodium chloride 0.9 % injection 3 mL  3 mL Intravenous Q12H Doree Albee, MD      . Melene Muller ON 12/30/2013] vancomycin (VANCOCIN) IVPB 750 mg/150 ml premix  750 mg Intravenous Q48H Drake Leach [place], [facility]       Current Outpatient Prescriptions  Medication Sig Dispense Refill  . acetaminophen (TYLENOL) 500 MG tablet Take 1,000 mg by mouth every 6 (six)  hours as needed for mild pain.      . Tetrahydrozoline HCl (VISINE OP) Place 2 drops into both eyes daily as needed (dry eyes).       Review Of Systems: 12 point ROS negative except as noted above in HPI.  Physical Exam: Filed Vitals:   12/28/13 1811  BP: 122/91  Pulse: 103  Temp: 100 F (37.8 C)  Resp: 18    General: alert and cooperative HEENT: PERRLA Heart: S1, S2 normal, no murmur, rub or gallop, regular rate and rhythm Lungs: clear to auscultation and unlabored breathing Abdomen: obese abdomen, mild L sided abdominal TTP Extremities: extremities normal, atraumatic, no cyanosis or edema Skin:+ B buttock abscess, actively draining, purosanguenous fluid   Neurology: normal without focal findings  Labs and Imaging: Lab Results  Component Value Date/Time   NA 141 12/28/2013 12:50 PM   K 4.3 12/28/2013 12:50 PM   CL 103 12/28/2013 12:50 PM   CO2 21 12/28/2013 12:50 PM   BUN 40* 12/28/2013 12:50 PM   CREATININE 3.21* 12/28/2013 12:50 PM   GLUCOSE 106* 12/28/2013 12:50 PM   Lab Results  Component Value Date   WBC 13.2* 12/28/2013   HGB 10.7* 12/28/2013   HCT 32.4* 12/28/2013   MCV 84.4 12/28/2013   PLT 354 12/28/2013   Urinalysis    Component Value Date/Time   COLORURINE YELLOW 12/28/2013 1236   APPEARANCEUR CLOUDY* 12/28/2013 1236   LABSPEC 1.014 12/28/2013 1236   PHURINE 5.5 12/28/2013 1236   GLUCOSEU NEGATIVE 12/28/2013 1236   HGBUR SMALL* 12/28/2013 1236   BILIRUBINUR NEGATIVE 12/28/2013 1236   KETONESUR NEGATIVE 12/28/2013 1236   PROTEINUR 100* 12/28/2013 1236   UROBILINOGEN 0.2 12/28/2013 1236   NITRITE NEGATIVE 12/28/2013 1236   LEUKOCYTESUR TRACE* 12/28/2013 1236       Ct Abdomen Pelvis Wo Contrast  12/28/2013   CLINICAL DATA:  Mid upper abdominal pain, perianal/rectal pain, abscess  EXAM: CT ABDOMEN AND PELVIS WITHOUT CONTRAST  TECHNIQUE: Multidetector CT imaging of the abdomen and pelvis was performed following the standard protocol without intravenous contrast.  COMPARISON:  CT abdomen pelvis - 08/22/2011  FINDINGS: The lack of intravenous contrast limits the ability to evaluate solid abdominal organs.  Unchanged punctate calcifications within the liver, presumably the sequela of prior granulomatous infection. Normal appearance of the gallbladder. No ascites.  The bilateral kidneys appear atrophic. There is a suspected approximately 1.3 cm hypo attenuating partially exophytic lesion arising from the superior lateral aspect of the right kidney (image 27, series 5) which appears morphologically unchanged since the 2012 examination no is incompletely evaluated on the present examination. There are multiple nonobstructing renal stones seen  bilaterally with dominant nonobstructing stone within the interpolar region of the left kidney measuring approximately 0.7 cm in diameter (coronal image 61) and dominant nonobstructing stone within the superior pole of the right kidney measuring approximately 0.4 cm (coronal image 68). No definite renal stones are seen along the expected course of either ureter or the urinary bladder. Normal noncontrast appearance of the urinary bladder given degree distention. Multiple phleboliths overlie the lower pelvis. Minimal amount of likely age-related perinephric stranding. No urinary obstruction.  Normal noncontrast appearance of the bilateral adrenal gland glands and spleen. Grossly unchanged appearance of approximately 1.7 x 1.4 cm hypo attenuating lesion within the posterior aspect of the pancreatic head (image 32, series 5).  Ingested enteric contrast extends to the level of the distal small bowel. No evidence of enteric obstruction. Colonic diverticulosis without evidence of diverticulitis. There  is no definitive rectal wall thickening on this noncontrast examination. No pneumoperitoneum, pneumatosis or portal venous gas. The appendix is not visualized, however there is moderate amount of calcified atherosclerotic plaque within a normal caliber abdominal aorta. Scattered retroperitoneal lymph nodes individually not enlarged by size criteria. No retroperitoneal, mesenteric, pelvic or inguinal lymphadenopathy. No inflammatory change within the right lower abdominal quadrant.  Post hysterectomy. No discrete adnexal lesion. No free fluid within the pelvis.  Limited visualization of the lower thorax is negative focal airspace opacity or pleural effusion.  Normal heart size.  No pericardial effusion.  No acute or aggressive osseous abnormalities. Mild to moderate multilevel lumbar spine DDD, likely worse at L3-L4 and L4-L5 with disc space height loss, endplate irregularity and small posteriorly directed disc osteophyte  complexes at these locations.  There is ill-defined soft tissue stranding about the medial aspect of the right buttocks measuring approximately 2.1 x 2.5 cm (image 79, series 5) without definitive definable/drainable fluid collection on this noncontrast examination.  IMPRESSION: 1. Ill-defined soft tissue stranding about the medial aspect of the right buttocks without associated definitive definable/drainable fluid collection on this noncontrast examination. Clinical correlation is advised. 2. Colonic diverticulosis without evidence of diverticulitis. No definite evidence of definitive rectal wall thickening on this noncontrast examination. 3. Interval progression of bilateral nonobstructing nephrolithiasis. 4. Suspected approximately 1.3 cm hypo attenuating partially exophytic lesion arising from the superior pole of the right kidney is morphologically unchanged since the 2012 examination and thus favored to represent a renal cyst. 5. The approximately 1.7 cm hypo attenuating lesion within the posterior aspect of the pancreatic head is incompletely evaluated on this noncontrast examination though morphologically unchanged since the 09/2010 examination and thus favored to represent a benign pancreatic cystic lesion.   Electronically Signed   By: Simonne Come M.D.   On: 12/28/2013 16:58     Assessment and Plan: Monica [name] is a 80 y.o. year old female presenting with GIB and R buttock abscess   GIB: Likely upper GI source. Hgb fairly stable. Type and screen. Serial CBCs. Stepdown bed. GI consult. Hold on anticoagulation. PPI.   R buttock abscess: S/p I and D in ER. Surgery consult pending. WOund cx obtained. Continue vanc and zosyn. Trend WBC. Follow urine and blood cultures.   HTN: elevated today. Continue to follow. Prn hydralazine.   Anemia: Poor pt historian, though hgb seems to be near baseline in comparison to 2012. Likely anemia of chronic disease. Continue to follow closely. Serial CBCs. Type and  screen.  CRF: Cr 3.2 today. Slightly above baseline (2.6) in setting of active GIB. Gentle hydration and reassess.  FEN/GI: NPO. PPI as above.  Prophylaxis: SCDs Disposition: pending further evaluation.  Code Status:Full Code.        Doree Albee MD  Pager: [phone]

## 2013-12-28 NOTE — ED Provider Notes (Signed)
INCISION AND DRAINAGE Performed by: Johnnette GourdAlbert, Shaunette Gassner Consent: Verbal consent obtained. Risks and benefits: risks, benefits and alternatives were discussed Type: abscess  Body area: right buttock  Anesthesia: local infiltration  Incision was made with a scalpel.  Local anesthetic: lidocaine 2% with epinephrine  Anesthetic total: 2 ml  Complexity: complex Blunt dissection to break up loculations  Drainage: purulent  Drainage amount: large  Packing material: none  Patient tolerance: Patient tolerated the procedure well with no immediate complications.     Trevor MaceRobyn M Albert, PA-C 12/28/13 1249

## 2013-12-28 NOTE — Discharge Instructions (Signed)
We have determined that your problem requires further evaluation in the emergency department.  We will take care of your transport there.  Once at the emergency department, you will be evaluated by a provider and they will order whatever treatment or tests they deem necessary.  We cannot guarantee that they will do any specific test or do any specific treatment.  ° °

## 2013-12-28 NOTE — ED Notes (Addendum)
States she had been told by her home health nurse to go to the hospital, since she was having abdominal pain and vomiting w black stools. C/o pain in her left upper chest, abscess on her buttocks, generalized abdominal pain; w/d/color good

## 2013-12-29 ENCOUNTER — Encounter (HOSPITAL_COMMUNITY): Payer: Self-pay

## 2013-12-29 ENCOUNTER — Encounter (HOSPITAL_COMMUNITY)
Admission: EM | Disposition: A | Discharge status: Home / Self Care | Payer: Self-pay | Source: Home / Self Care | Attending: Internal Medicine

## 2013-12-29 DIAGNOSIS — N185 Chronic kidney disease, stage 5: Secondary | ICD-10-CM

## 2013-12-29 DIAGNOSIS — L03317 Cellulitis of buttock: Secondary | ICD-10-CM

## 2013-12-29 DIAGNOSIS — I10 Essential (primary) hypertension: Secondary | ICD-10-CM | POA: Diagnosis present

## 2013-12-29 DIAGNOSIS — K254 Chronic or unspecified gastric ulcer with hemorrhage: Secondary | ICD-10-CM

## 2013-12-29 DIAGNOSIS — L0231 Cutaneous abscess of buttock: Secondary | ICD-10-CM

## 2013-12-29 DIAGNOSIS — K921 Melena: Secondary | ICD-10-CM | POA: Diagnosis present

## 2013-12-29 DIAGNOSIS — N390 Urinary tract infection, site not specified: Secondary | ICD-10-CM | POA: Diagnosis present

## 2013-12-29 DIAGNOSIS — I480 Paroxysmal atrial fibrillation: Secondary | ICD-10-CM | POA: Diagnosis present

## 2013-12-29 DIAGNOSIS — B9620 Unspecified Escherichia coli [E. coli] as the cause of diseases classified elsewhere: Secondary | ICD-10-CM | POA: Diagnosis present

## 2013-12-29 DIAGNOSIS — D72829 Elevated white blood cell count, unspecified: Secondary | ICD-10-CM | POA: Diagnosis present

## 2013-12-29 HISTORY — DX: Cutaneous abscess of buttock: L02.31

## 2013-12-29 HISTORY — DX: Urinary tract infection, site not specified: B96.20

## 2013-12-29 HISTORY — DX: Chronic or unspecified gastric ulcer with hemorrhage: K25.4

## 2013-12-29 HISTORY — PX: ESOPHAGOGASTRODUODENOSCOPY: SHX5428

## 2013-12-29 HISTORY — DX: Chronic kidney disease, stage 5: N18.5

## 2013-12-29 HISTORY — DX: Melena: K92.1

## 2013-12-29 LAB — COMPREHENSIVE METABOLIC PANEL
ALT: 8 U/L (ref 0–35)
AST: 14 U/L (ref 0–37)
Albumin: 3.8 g/dL (ref 3.5–5.2)
Alkaline Phosphatase: 96 U/L (ref 39–117)
BUN: 36 mg/dL — AB (ref 6–23)
CALCIUM: 9.7 mg/dL (ref 8.4–10.5)
CO2: 19 meq/L (ref 19–32)
CREATININE: 3.09 mg/dL — AB (ref 0.50–1.10)
Chloride: 102 mEq/L (ref 96–112)
GFR, EST AFRICAN AMERICAN: 15 mL/min — AB (ref >90)
GFR, EST NON AFRICAN AMERICAN: 13 mL/min — AB (ref >90)
Glucose, Bld: 103 mg/dL — ABNORMAL HIGH (ref 70–99)
Potassium: 4.2 mEq/L (ref 3.7–5.3)
SODIUM: 139 meq/L (ref 137–147)
TOTAL PROTEIN: 8.2 g/dL (ref 6.0–8.3)
Total Bilirubin: 0.4 mg/dL (ref 0.3–1.2)

## 2013-12-29 LAB — CBC WITH DIFFERENTIAL/PLATELET
BASOS ABS: 0 10*3/uL (ref 0.0–0.1)
BASOS PCT: 0 % (ref 0–1)
EOS ABS: 0.1 10*3/uL (ref 0.0–0.7)
EOS PCT: 1 % (ref 0–5)
HCT: 36.6 % (ref 36.0–46.0)
Hemoglobin: 12.3 g/dL (ref 12.0–15.0)
Lymphocytes Relative: 28 % (ref 12–46)
Lymphs Abs: 2.8 10*3/uL (ref 0.7–4.0)
MCH: 28 pg (ref 26.0–34.0)
MCHC: 33.6 g/dL (ref 30.0–36.0)
MCV: 83.4 fL (ref 78.0–100.0)
MONO ABS: 0.6 10*3/uL (ref 0.1–1.0)
Monocytes Relative: 6 % (ref 3–12)
NEUTROS ABS: 6.4 10*3/uL (ref 1.7–7.7)
Neutrophils Relative %: 65 % (ref 43–77)
Platelets: 342 10*3/uL (ref 150–400)
RBC: 4.39 MIL/uL (ref 3.87–5.11)
RDW: 14.5 % (ref 11.5–15.5)
WBC: 9.9 10*3/uL (ref 4.0–10.5)

## 2013-12-29 LAB — CBC
HCT: 32.4 % — ABNORMAL LOW (ref 36.0–46.0)
Hemoglobin: 10.8 g/dL — ABNORMAL LOW (ref 12.0–15.0)
MCH: 27.9 pg (ref 26.0–34.0)
MCHC: 33.3 g/dL (ref 30.0–36.0)
MCV: 83.7 fL (ref 78.0–100.0)
PLATELETS: 387 10*3/uL (ref 150–400)
RBC: 3.87 MIL/uL (ref 3.87–5.11)
RDW: 14.5 % (ref 11.5–15.5)
WBC: 12.2 10*3/uL — ABNORMAL HIGH (ref 4.0–10.5)

## 2013-12-29 LAB — MRSA PCR SCREENING: MRSA BY PCR: POSITIVE — AB

## 2013-12-29 SURGERY — EGD (ESOPHAGOGASTRODUODENOSCOPY)
Anesthesia: Moderate Sedation

## 2013-12-29 MED ORDER — ACETAMINOPHEN 500 MG PO TABS
1000.0000 mg | ORAL_TABLET | Freq: Four times a day (QID) | ORAL | Status: DC | PRN
  Administered 2013-12-29 – 2013-12-30 (×4): 1000 mg via ORAL
  Filled 2013-12-29 (×5): qty 2

## 2013-12-29 MED ORDER — PIPERACILLIN-TAZOBACTAM IN DEX 2-0.25 GM/50ML IV SOLN
2.2500 g | Freq: Four times a day (QID) | INTRAVENOUS | Status: DC
  Administered 2013-12-29 – 2013-12-31 (×10): 2.25 g via INTRAVENOUS
  Filled 2013-12-29 (×12): qty 50

## 2013-12-29 MED ORDER — FENTANYL CITRATE 0.05 MG/ML IJ SOLN
INTRAMUSCULAR | Status: DC | PRN
  Administered 2013-12-29 (×2): 25 ug via INTRAVENOUS

## 2013-12-29 MED ORDER — BUTAMBEN-TETRACAINE-BENZOCAINE 2-2-14 % EX AERO
INHALATION_SPRAY | CUTANEOUS | Status: DC | PRN
  Administered 2013-12-29: 1 via TOPICAL

## 2013-12-29 MED ORDER — MIDAZOLAM HCL 10 MG/2ML IJ SOLN
INTRAMUSCULAR | Status: DC | PRN
  Administered 2013-12-29 (×2): 2 mg via INTRAVENOUS

## 2013-12-29 MED ORDER — HYDRALAZINE HCL 20 MG/ML IJ SOLN
INTRAMUSCULAR | Status: AC
  Filled 2013-12-29: qty 1

## 2013-12-29 MED ORDER — PANTOPRAZOLE SODIUM 40 MG PO TBEC
40.0000 mg | DELAYED_RELEASE_TABLET | Freq: Every day | ORAL | Status: DC
  Administered 2013-12-30 – 2013-12-31 (×2): 40 mg via ORAL
  Filled 2013-12-29 (×2): qty 1

## 2013-12-29 MED ORDER — FENTANYL CITRATE 0.05 MG/ML IJ SOLN
INTRAMUSCULAR | Status: AC
  Filled 2013-12-29: qty 2

## 2013-12-29 MED ORDER — MIDAZOLAM HCL 5 MG/ML IJ SOLN
INTRAMUSCULAR | Status: AC
  Filled 2013-12-29: qty 2

## 2013-12-29 MED ORDER — SODIUM CHLORIDE 0.9 % IV SOLN
INTRAVENOUS | Status: DC
  Administered 2013-12-29: 14:00:00 via INTRAVENOUS

## 2013-12-29 NOTE — Progress Notes (Signed)
Team 1 - Stepdown / ICU Progress Note  Monica [name] ZOX:[id number] DOB: 11-20-1932 DOA: 12/28/2013 PCP: No PCP Per Patient  Brief narrative: 80 year old female patient with multiple medical problems including hypertension and chronic kidney disease. Remote history of alcoholism-/current in the 1980s. Presented to the ER with complaints of black tarry stools for 2 weeks. Denied aspirin CAD use. No prior blood per rectum and no nausea or vomiting. In addition patient reported pain of the left buttock region for 4-5 days that has become increasingly more tender with palpation and touch. No fevers or chills reported.  In the ER she was noted to be Hemoccult positive on exam. She had a mild leukocytosis with a white count of 13,000. Hemoglobin stable at 10.7. CT of the abdomen and pelvis did not reveal a perirectal abscess but did reveal soft tissue stranding consistent with likely abscess. Urinalysis was mildly abnormal. The abscess was I/D'd at the bedside by the ER physician with cultures being obtained. The patient was started on empiric vancomycin and Zosyn as well as blood cultures. Surgery was consulted.  Assessment/Plan: Active Problems:   Presumed Upper GI bleed/Melena -Appreciate GI assistance -EGD pending -Hemoglobin stable -Patient endorsed stools have changed to a grayish color/LFTs are normal    Abscess of buttock -Appreciate surgery assistance -Wound draining and improving in appearance so hopeful no additional drainage procedures indicated -Continue empiric antibiotics    HTN (hypertension) -Moderate control -No orthostasis so doubt volume depleted -Since n.p.o. we'll continue fluids at 40 cc per hour but once can advance diet need to [facility] fluids -Was not on any hypertensive medications prior to admission but likely needs to be started on medications at this time especially given poor renal function -According to 2012 discharge summary the patient was supposed to be on either  quinapril or lisinopril, every other day Lasix, and carvedilol -Does not appear as if patient has primary care provider    Leukocytosis -Resolved after I&D procedure and initiation of antibiotics   Escherichia coli urinary tract infection -Greater than 100,000 colonies -Sensitivities pending -Continue Zosyn    PAF (paroxysmal atrial fibrillation) -Currently maintaining normal sinus rhythm    CKD (chronic kidney disease), stage IV -BUN was 47 and creatinine 2.64 and 2012 noting at time of presentation for that admission she was dehydrated and her creatinine was 4.7 -Repeat labs since has been rehydrated post admission   DVT prophylaxis: SCDs Code Status: Full Family Communication: No family at bedside Disposition Plan/Expected LOS: Transfer to telemetry   Consultants: Gastroenterology  Procedures: EGD pending  Antibiotics: Zosyn Vancomycin  HPI/Subjective: Patient alert and anxious. Reporting mid dominant discomfort that she relates to not being able to eat. No dizziness upon standing. No further melanotic stools-states stools are now gray in color  Objective: Blood pressure 158/90, pulse 85, temperature 98.2 F (36.8 C), temperature source Oral, resp. rate 19, height 5' (1.524 m), weight 160 lb 4.4 oz (72.7 kg), SpO2 100.00%.  Intake/Output Summary (Last 24 hours) at 12/29/13 1338 Last data filed at 12/29/13 1300  Gross per 24 hour  Intake 1274.67 ml  Output    875 ml  Net 399.67 ml     Exam: General: No acute respiratory distress Lungs: Clear to auscultation bilaterally without wheezes or crackles, RA Cardiovascular: Regular rate and rhythm without murmur gallop or rub normal S1 and S2, no peripheral edema or JVD-hypertensive Abdomen: Nontender, nondistended, soft, bowel sounds positive, no rebound, no ascites, no appreciable mass Musculoskeletal: No significant cyanosis, clubbing  of bilateral lower extremities Neurological: Alert and oriented x 3, moves all  extremities x 4 without focal neurological deficits, CN 2-12 intact  Scheduled Meds:  Scheduled Meds: . pantoprazole (PROTONIX) IV  40 mg Intravenous Q12H  . piperacillin-tazobactam (ZOSYN)  IV  2.25 g Intravenous Q6H  . sodium chloride  3 mL Intravenous Q12H  . [START ON 12/30/2013] vancomycin  750 mg Intravenous Q48H   Continuous Infusions: . sodium chloride 100 mL/hr at 12/29/13 1300    Data Reviewed: Basic Metabolic Panel:  Recent Labs Lab 12/28/13 1250 12/29/13 0258  NA 141 139  K 4.3 4.2  CL 103 102  CO2 21 19  GLUCOSE 106* 103*  BUN 40* 36*  CREATININE 3.21* 3.09*  CALCIUM 9.8 9.7   Liver Function Tests:  Recent Labs Lab 12/28/13 1250 12/29/13 0258  AST 12 14  ALT 8 8  ALKPHOS 93 96  BILITOT 0.3 0.4  PROT 8.3 8.2  ALBUMIN 3.9 3.8    Recent Labs Lab 12/28/13 1250  LIPASE 67*   No results found for this basename: AMMONIA,  in the last 168 hours CBC:  Recent Labs Lab 12/28/13 1250 12/28/13 1856 12/29/13 0255 12/29/13 0500  WBC 13.2* 12.7* 12.2* 9.9  NEUTROABS 9.5*  --   --  6.4  HGB 10.7* 10.3* 10.8* 12.3  HCT 32.4* 31.1* 32.4* 36.6  MCV 84.4 83.8 83.7 83.4  PLT 354 341 387 342   Cardiac Enzymes:  Recent Labs Lab 12/28/13 1250  TROPONINI <0.30   BNP (last 3 results) No results found for this basename: PROBNP,  in the last 8760 hours CBG: No results found for this basename: GLUCAP,  in the last 168 hours  Recent Results (from the past 240 hour(s))  URINE CULTURE     Status: None   Collection Time    12/28/13 12:36 PM      Result Value Range Status   Specimen Description URINE, CLEAN CATCH   Final   Special Requests NONE   Final   Culture  Setup Time     Final   Value: 12/28/2013 20:19     Performed at [facility]   Colony Count     Final   Value: >=100,000 COLONIES/ML     Performed at [facility]   Culture     Final   Value: ESCHERICHIA COLI     Performed at [facility]   Report Status PENDING    Incomplete  CULTURE, BLOOD (ROUTINE X 2)     Status: None   Collection Time    12/28/13  4:53 PM      Result Value Range Status   Specimen Description BLOOD RIGHT HAND   Final   Special Requests BOTTLES DRAWN AEROBIC AND ANAEROBIC 5CC   Final   Culture  Setup Time     Final   Value: 12/28/2013 23:16     Performed at [facility]   Culture     Final   Value:        BLOOD CULTURE RECEIVED NO GROWTH TO DATE CULTURE WILL BE HELD FOR 5 DAYS BEFORE ISSUING A FINAL NEGATIVE REPORT     Performed at [facility]   Report Status PENDING   Incomplete  CULTURE, BLOOD (ROUTINE X 2)     Status: None   Collection Time    12/28/13  5:04 PM      Result Value Range Status   Specimen Description BLOOD LEFT HAND  Final   Special Requests BOTTLES DRAWN AEROBIC ONLY 5CC   Final   Culture  Setup Time     Final   Value: 12/28/2013 23:16     Performed at [facility]   Culture     Final   Value:        BLOOD CULTURE RECEIVED NO GROWTH TO DATE CULTURE WILL BE HELD FOR 5 DAYS BEFORE ISSUING A FINAL NEGATIVE REPORT     Performed at [facility]   Report Status PENDING   Incomplete  CULTURE, ROUTINE-ABSCESS     Status: None   Collection Time    12/28/13  5:19 PM      Result Value Range Status   Specimen Description ABSCESS PERIRECTAL   Final   Special Requests NONE   Final   Gram Stain     Final   Value: NO WBC SEEN     NO SQUAMOUS EPITHELIAL CELLS SEEN     NO ORGANISMS SEEN     Performed at [facility]   Culture     Final   Value: NO GROWTH     Performed at [facility]   Report Status PENDING   Incomplete  MRSA PCR SCREENING     Status: Abnormal   Collection Time    12/28/13 10:13 PM      Result Value Range Status   MRSA by PCR POSITIVE (*) NEGATIVE Final   Comment:            The GeneXpert MRSA Assay (FDA     approved for NASAL specimens     only), is one component of a     comprehensive MRSA colonization     surveillance program. It is not       intended to diagnose MRSA     infection nor to guide or     monitor treatment for     MRSA infections.     RESULT CALLED TO, READ BACK BY AND VERIFIED WITH:     Bronson Curb 12/29/13 0438 BY RHOLMES     Studies:  Recent x-ray studies have been reviewed in detail by the Attending Physician  Time spent :     Junious Silk, ANP [facility] Office  [phone] Pager [phone]  **If unable to reach the above provider after paging please contact the Flow Manager @ [phone]  On-Call/Text Page:      [email or web address]      password [email or web address]  If 7PM-7AM, please contact night-coverage [email or web address] Password [facility] 12/29/2013, 1:38 PM   LOS: 1 day   I have examined the patient, reviewed the chart and modified the above note which I agree with.   Deannah Rossi,MD [phone] 12/29/2013, 6:25 PM

## 2013-12-29 NOTE — Op Note (Signed)
[facility] [address] [place], [id number]   ENDOSCOPY PRexene EdisonOCEDURE REPORT  PATIENT: Monica [name], Monica S.  MR#: [id number] BIRTHDATE: 03/21/1932 , 81  yrs. old GENDER: Female ENDOSCOPIST: Hart Carwinora M Trayden Brandy, MD REFERRED BY:  Dr Kathie RhodesS.Rizwan PROCEDURE DATE:  12/29/2013 PROCEDURE:  EGD w/ biopsy ASA CLASS:     Class III INDICATIONS:  Melena.   Heme positive stool. MEDICATIONS: These medications were titrated to patient response per physician's verbal order, Fentanyl 50 mcg IV, and Versed 4 mg IV TOPICAL ANESTHETIC: Cetacaine Spray  DESCRIPTION OF PROCEDURE: After the risks benefits and alternatives of the procedure were thoroughly explained, informed consent was obtained.  The Pentax Gastroscope [id number] endoscope was introduced through the mouth and advanced to the second portion of the duodenum. Without limitations.  The instrument was slowly withdrawn as the mucosa was fully examined.      Esophagus:[ proximal mid and distal esophageal mucosa was normal. Z line was regular. There was no stricture. There was a 1-2 cm reducible hiatal hernia Stomach: Out of the stomach and gastric folds were normal. There was a 1-2 cm deep prepyloric ulcer without stigmata of recent bleeding. It had smooth edges and appeared to be benign. Multiple biopsies were obtained from AHO the ulcer. Gastric outlet was not obstructed.  Duodenum duodenal bulb and descending duodenum was normal The endoscope was retroflexed in fundus and cardia of stomach appeared normal        The scope was then withdrawn from the patient and the procedure completed.  COMPLICATIONS: There were no complications. ENDOSCOPIC IMPRESSION:  benign appearing prepyloric ulcer measuring 15-20 mm this was biopsies. No evidence of recent bleeding. No gastric outlet obstruction   RECOMMENDATIONS: 1.  Await biopsy result, streat if H. pylori positive 2.  Continue PPI Follow H&H Advance diet If stable could be  discharged tomorrow, Recall upper endoscopy in 6-8 weeks to ensure complete healing of the ulcer  REPEAT EXAM: for EGD . 6-8 weerks  eSigned:  Hart Carwinora M Kasir Hallenbeck, MD 12/29/2013 3:04 PM   CC:  PATIENT NAME:  Monica [name], Monica S. MR#: [id number]

## 2013-12-29 NOTE — Progress Notes (Signed)
Subjective: She reports drainage, less pain from buttock abscess  Objective: Vital signs in last 24 hours: Temp:  [98.3 F (36.8 C)-100 F (37.8 C)] 98.3 F (36.8 C) (01/22 0810) Pulse Rate:  [77-103] 80 (01/22 0900) Resp:  [16-22] 19 (01/22 0900) BP: (122-194)/(66-168) 194/168 mmHg (01/22 0900) SpO2:  [93 %-100 %] 100 % (01/22 0900) Weight:  [160 lb 4.4 oz (72.7 kg)] 160 lb 4.4 oz (72.7 kg) (01/22 0400)    Intake/Output from previous day: 01/21 0701 - 01/22 0700 In: 653 [I.V.:553; IV Piggyback:100] Out: 700 [Urine:700] Intake/Output this shift:   Right buttock abscess with less induration, tenderness  Lab Results:   Recent Labs  12/29/13 0255 12/29/13 0500  WBC 12.2* 9.9  HGB 10.8* 12.3  HCT 32.4* 36.6  PLT 387 342   BMET  Recent Labs  12/28/13 1250 12/29/13 0258  NA 141 139  K 4.3 4.2  CL 103 102  CO2 21 19  GLUCOSE 106* 103*  BUN 40* 36*  CREATININE 3.21* 3.09*  CALCIUM 9.8 9.7   PT/INR  Recent Labs  12/28/13 1250  LABPROT 12.3  INR 0.93   ABG No results found for this basename: PHART, PCO2, PO2, HCO3,  in the last 72 hours  Studies/Results: Ct Abdomen Pelvis Wo Contrast  12/28/2013   CLINICAL DATA:  Mid upper abdominal pain, perianal/rectal pain, abscess  EXAM: CT ABDOMEN AND PELVIS WITHOUT CONTRAST  TECHNIQUE: Multidetector CT imaging of the abdomen and pelvis was performed following the standard protocol without intravenous contrast.  COMPARISON:  CT abdomen pelvis - 08/22/2011  FINDINGS: The lack of intravenous contrast limits the ability to evaluate solid abdominal organs.  Unchanged punctate calcifications within the liver, presumably the sequela of prior granulomatous infection. Normal appearance of the gallbladder. No ascites.  The bilateral kidneys appear atrophic. There is a suspected approximately 1.3 cm hypo attenuating partially exophytic lesion arising from the superior lateral aspect of the right kidney (image 27, series 5) which  appears morphologically unchanged since the 2012 examination no is incompletely evaluated on the present examination. There are multiple nonobstructing renal stones seen bilaterally with dominant nonobstructing stone within the interpolar region of the left kidney measuring approximately 0.7 cm in diameter (coronal image 61) and dominant nonobstructing stone within the superior pole of the right kidney measuring approximately 0.4 cm (coronal image 68). No definite renal stones are seen along the expected course of either ureter or the urinary bladder. Normal noncontrast appearance of the urinary bladder given degree distention. Multiple phleboliths overlie the lower pelvis. Minimal amount of likely age-related perinephric stranding. No urinary obstruction.  Normal noncontrast appearance of the bilateral adrenal gland glands and spleen. Grossly unchanged appearance of approximately 1.7 x 1.4 cm hypo attenuating lesion within the posterior aspect of the pancreatic head (image 32, series 5).  Ingested enteric contrast extends to the level of the distal small bowel. No evidence of enteric obstruction. Colonic diverticulosis without evidence of diverticulitis. There is no definitive rectal wall thickening on this noncontrast examination. No pneumoperitoneum, pneumatosis or portal venous gas. The appendix is not visualized, however there is moderate amount of calcified atherosclerotic plaque within a normal caliber abdominal aorta. Scattered retroperitoneal lymph nodes individually not enlarged by size criteria. No retroperitoneal, mesenteric, pelvic or inguinal lymphadenopathy. No inflammatory change within the right lower abdominal quadrant.  Post hysterectomy. No discrete adnexal lesion. No free fluid within the pelvis.  Limited visualization of the lower thorax is negative focal airspace opacity or pleural effusion.  Normal heart  size.  No pericardial effusion.  No acute or aggressive osseous abnormalities. Mild to  moderate multilevel lumbar spine DDD, likely worse at L3-L4 and L4-L5 with disc space height loss, endplate irregularity and small posteriorly directed disc osteophyte complexes at these locations.  There is ill-defined soft tissue stranding about the medial aspect of the right buttocks measuring approximately 2.1 x 2.5 cm (image 79, series 5) without definitive definable/drainable fluid collection on this noncontrast examination.  IMPRESSION: 1. Ill-defined soft tissue stranding about the medial aspect of the right buttocks without associated definitive definable/drainable fluid collection on this noncontrast examination. Clinical correlation is advised. 2. Colonic diverticulosis without evidence of diverticulitis. No definite evidence of definitive rectal wall thickening on this noncontrast examination. 3. Interval progression of bilateral nonobstructing nephrolithiasis. 4. Suspected approximately 1.3 cm hypo attenuating partially exophytic lesion arising from the superior pole of the right kidney is morphologically unchanged since the 2012 examination and thus favored to represent a renal cyst. 5. The approximately 1.7 cm hypo attenuating lesion within the posterior aspect of the pancreatic head is incompletely evaluated on this noncontrast examination though morphologically unchanged since the 09/2010 examination and thus favored to represent a benign pancreatic cystic lesion.   Electronically Signed   By: Simonne ComeJohn  Watts M.D.   On: 12/28/2013 16:58    Anti-infectives: Anti-infectives   Start     Dose/Rate Route Frequency Ordered Stop   12/30/13 1800  vancomycin (VANCOCIN) IVPB 750 mg/150 ml premix     750 mg 150 mL/hr over 60 Minutes Intravenous Every 48 hours 12/28/13 1643     12/29/13 0145  piperacillin-tazobactam (ZOSYN) IVPB 2.25 g     2.25 g 100 mL/hr over 30 Minutes Intravenous 4 times per day 12/29/13 0132     12/28/13 1815  piperacillin-tazobactam (ZOSYN) IVPB 3.375 g     3.375 g 100 mL/hr  over 30 Minutes Intravenous  Once 12/28/13 1814 12/28/13 1916   12/28/13 1645  vancomycin (VANCOCIN) IVPB 1000 mg/200 mL premix     1,000 mg 200 mL/hr over 60 Minutes Intravenous  Once 12/28/13 1643 12/28/13 1814      Assessment/Plan: s/p Procedure(s): ESOPHAGOGASTRODUODENOSCOPY (EGD) (N/A)  Right buttock abscess  Will continue to follow.  Hopefully will not need further I and D  LOS: 1 day    Gracelynn Bircher A 12/29/2013

## 2013-12-29 NOTE — Progress Notes (Signed)
Pt transferred to 5W10 from endoscopy. Report obtained at bedside.  VS obtained, assessment complete. Pt placed on telemetry monitor. Pt with small open abscess on right inner buttocks, draining serosanguinous fluid. Pt stable upon transfer, will continue to monitor per MD orders.

## 2013-12-29 NOTE — Progress Notes (Signed)
Utilization review completed. Mayela Bullard, RN, BSN. 

## 2013-12-29 NOTE — Consult Note (Signed)
[facility] Consult: 8:40 AM 12/29/2013  LOS: 1 day    Referring Provider: Dr Alvester Morin [facility]   Primary Care Physician:  Previously Dr Bascom Levels.  Primary Gastroenterologist:  Elisha Ponder in 2012, they say pt unassigned. Dr Loreta Ave before  2010.     Reason for Consultation:  Tarry stool, anemia.    HPI: Monica [name] is a 80 y.o. female.  Hx htn, baseline creatinine 2.6/stage 4 CKD , stable pancreatic head cyst, diverticulosis per CT scan. Hx bleeding hemorrhoids in 2011.  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 Progressive renal failure with BUN/creat:  40/3.2.  Pt relayed hx tarry stool.  Her Hgb was 10.7, MCV 84.  It is up to 12.3 today and no transfusions so far. Baseline Hgb in 10 to 11.0.  2 weeks of  Dark stools every 3 or so days.  vomitted 2 x last week, possible CG material but no blood.  No syncope or new SOB or new fatigue.  No NSAIDs.  Last ETOH was in 1980s.  No belly pain until last night, describes pain as shrp hunger pangs.  No dysphagia, no change of bowel habits.  No primary care visits.  Not on a PPI    Past Medical History  Diagnosis Date  . Hypertension   . Renal insufficiency   . ETOHism   . GERD (gastroesophageal reflux disease)   . Arthritis   . Atrial flutter   . Chronic renal failure   . Hemorrhoids   . S/P appy   . S/P tubal ligation   . Dementia     History reviewed. No pertinent past surgical history.  Prior to Admission medications   Medication Sig Start Date End Date Taking? Authorizing Provider  acetaminophen (TYLENOL) 500 MG tablet Take 1,000 mg by mouth every 6 (six) hours as needed for mild pain.   Yes Historical Provider, MD    Tetrahydrozoline HCl (VISINE OP) Place 2 drops into both eyes daily as needed (dry eyes).   Yes Historical Provider, MD    Scheduled Meds: . pantoprazole (PROTONIX) IV  40 mg Intravenous Q12H  . piperacillin-tazobactam (ZOSYN)  IV  2.25 g Intravenous Q6H  . sodium chloride  3 mL Intravenous Q12H  . [START ON 12/30/2013] vancomycin  750 mg Intravenous Q48H   Infusions: . sodium chloride 100 mL/hr (12/29/13 0647)   PRN Meds: hydrALAZINE   Allergies as of 12/28/2013 - Review Complete 12/28/2013  Allergen Reaction Noted  . Aspirin Other (See Comments) 08/22/2011  . Codeine Other (See Comments) 08/22/2011    No family history on file.  History   Social History  . Marital Status: Married    Spouse Name: N/A    Number of Children: N/A  . Years of Education: N/A   Occupational History  . Not on file.   Social History Main Topics  . Smoking status: Former Games developer, Civil engineer, contracting  . Smokeless tobacco: Not on  file  . Alcohol Use: No     Comment: former drinker, quit 1987  . Drug Use: Not on file  . Sexual Activity: Not on file   Other Topics Concern  . Not on file   Social History Narrative  . Son lives with pt and her dog.     REVIEW OF SYSTEMS: Constitutional:  Some weight loss, clothes looser, not clear how much loss ENT:  No nose bleeds Pulm:  No SOB or cough CV:  No palpitations, no LE edema.  GU:  No hematuria, no frequency GI:  Per HPI.  No dysphagia Heme:  No rx for iron in past   Transfusions:  None in past Neuro:  No headaches, no peripheral tingling or numbness Derm:  No itching, no rash or sores.  Endocrine:  No sweats or chills.  No polyuria or dysuria Immunization:  No flu shot this year Travel:  None beyond local counties in last few months.    PHYSICAL EXAM: Vital signs in last 24 hours: Filed Vitals:   12/29/13 0810  BP: 147/87  Pulse: 87  Temp: 98.3 F (36.8 C)  Resp: 21   Wt Readings from Last 3 Encounters:  12/29/13 72.7 kg (160 lb 4.4  oz)  12/29/13 72.7 kg (160 lb 4.4 oz)   General: pleasnt, alert eldeerly AAF who is comfortable Head:  No asymmetry or swelling  Eyes:  No icterus or pallor Ears:  Not HOH  Nose:  No congestion Mouth:  Clear and moist oral mm Neck:  No mass or JVD Lungs:  Clear.  No labored breathing or cough Heart: RRR.  No mrg Abdomen:  Soft, nt, nd, no mass or hsm.  Active bs, no bruits.   Rectal: not repeated.  FOB + per resident   Musc/Skeltl: no joint swelling Extremities:  No pedal edema  Neurologic:  Oriented to place, self, time.  No tremor, moves all 4.  No gross deficits Skin:  No rash, no telangectasia Tattoos:  none Nodes:  No inguinal adenopathy   Psych:  Pleasant, relaxed  Intake/Output from previous day: 01/21 0701 - 01/22 0700 In: 653 [I.V.:553; IV Piggyback:100] Out: 700 [Urine:700] Intake/Output this shift:    LAB RESULTS:  Recent Labs  12/28/13 1856 12/29/13 0255 12/29/13 0500  WBC 12.7* 12.2* 9.9  HGB 10.3* 10.8* 12.3  HCT 31.1* 32.4* 36.6  PLT 341 387 342  mcv      83  BMET Lab Results  Component Value Date   NA 139 12/29/2013   NA 141 12/28/2013   NA 139 08/25/2011   K 4.2 12/29/2013   K 4.3 12/28/2013   K 3.7 08/25/2011   CL 102 12/29/2013   CL 103 12/28/2013   CL 107 08/25/2011   CO2 19 12/29/2013   CO2 21 12/28/2013   CO2 22 08/25/2011   GLUCOSE 103* 12/29/2013   GLUCOSE 106* 12/28/2013   GLUCOSE 86 08/25/2011   BUN 36* 12/29/2013   BUN 40* 12/28/2013   BUN 23 08/25/2011   CREATININE 3.09* 12/29/2013   CREATININE 3.21* 12/28/2013   CREATININE 2.22* 08/25/2011   CALCIUM 9.7 12/29/2013   CALCIUM 9.8 12/28/2013   CALCIUM 9.0 08/25/2011   LFT  Recent Labs  12/28/13 1250 12/29/13 0258  PROT 8.3 8.2  ALBUMIN 3.9 3.8  AST 12 14  ALT 8 8  ALKPHOS 93 96  BILITOT 0.3 0.4   PT/INR Lab Results  Component Value Date   INR 0.93 12/28/2013   INR 1.05 08/21/2011  INR 1.74* 07/20/2011  Lipase     Component Value Date/Time   LIPASE 67* 12/28/2013 1250      RADIOLOGY STUDIES: Ct Abdomen Pelvis Wo Contrast  12/28/2013   CLINICAL DATA:  Mid upper abdominal pain, perianal/rectal pain, abscess  EXAM: CT ABDOMEN AND PELVIS WITHOUT CONTRAST  TECHNIQUE: Multidetector CT imaging of the abdomen and pelvis was performed following the standard protocol without intravenous contrast.  COMPARISON:  CT abdomen pelvis - 08/22/2011  FINDINGS: The lack of intravenous contrast limits the ability to evaluate solid abdominal organs.  Unchanged punctate calcifications within the liver, presumably the sequela of prior granulomatous infection. Normal appearance of the gallbladder. No ascites.  The bilateral kidneys appear atrophic. There is a suspected approximately 1.3 cm hypo attenuating partially exophytic lesion arising from the superior lateral aspect of the right kidney (image 27, series 5) which appears morphologically unchanged since the 2012 examination no is incompletely evaluated on the present examination. There are multiple nonobstructing renal stones seen bilaterally with dominant nonobstructing stone within the interpolar region of the left kidney measuring approximately 0.7 cm in diameter (coronal image 61) and dominant nonobstructing stone within the superior pole of the right kidney measuring approximately 0.4 cm (coronal image 68). No definite renal stones are seen along the expected course of either ureter or the urinary bladder. Normal noncontrast appearance of the urinary bladder given degree distention. Multiple phleboliths overlie the lower pelvis. Minimal amount of likely age-related perinephric stranding. No urinary obstruction.  Normal noncontrast appearance of the bilateral adrenal gland glands and spleen. Grossly unchanged appearance of approximately 1.7 x 1.4 cm hypo attenuating lesion within the posterior aspect of the pancreatic head (image 32, series 5).  Ingested enteric contrast extends to the level of the distal small bowel. No evidence of enteric  obstruction. Colonic diverticulosis without evidence of diverticulitis. There is no definitive rectal wall thickening on this noncontrast examination. No pneumoperitoneum, pneumatosis or portal venous gas. The appendix is not visualized, however there is moderate amount of calcified atherosclerotic plaque within a normal caliber abdominal aorta. Scattered retroperitoneal lymph nodes individually not enlarged by size criteria. No retroperitoneal, mesenteric, pelvic or inguinal lymphadenopathy. No inflammatory change within the right lower abdominal quadrant.  Post hysterectomy. No discrete adnexal lesion. No free fluid within the pelvis.  Limited visualization of the lower thorax is negative focal airspace opacity or pleural effusion.  Normal heart size.  No pericardial effusion.  No acute or aggressive osseous abnormalities. Mild to moderate multilevel lumbar spine DDD, likely worse at L3-L4 and L4-L5 with disc space height loss, endplate irregularity and small posteriorly directed disc osteophyte complexes at these locations.  There is ill-defined soft tissue stranding about the medial aspect of the right buttocks measuring approximately 2.1 x 2.5 cm (image 79, series 5) without definitive definable/drainable fluid collection on this noncontrast examination.  IMPRESSION: 1. Ill-defined soft tissue stranding about the medial aspect of the right buttocks without associated definitive definable/drainable fluid collection on this noncontrast examination. Clinical correlation is advised. 2. Colonic diverticulosis without evidence of diverticulitis. No definite evidence of definitive rectal wall thickening on this noncontrast examination. 3. Interval progression of bilateral nonobstructing nephrolithiasis. 4. Suspected approximately 1.3 cm hypo attenuating partially exophytic lesion arising from the superior pole of the right kidney is morphologically unchanged since the 2012 examination and thus favored to represent a  renal cyst. 5. The approximately 1.7 cm hypo attenuating lesion within the posterior aspect of the pancreatic head is incompletely evaluated on this noncontrast examination though  morphologically unchanged since the 09/2010 examination and thus favored to represent a benign pancreatic cystic lesion.   Electronically Signed   By: Simonne Come M.D.   On: 12/28/2013 16:58    ENDOSCOPIC STUDIES: *  Flex sig and biopsy 08/2011 Dr Carman Ching Indication:Diarrhea and thickened rectum and sigmoid on CT. Negative  C. diff toxin with positive stool lactoferrin. The patient has been on  previous antibiotics. DESCRIPTION OF PROCEDURE: Procedure had been explained to the patient  and consent was obtained. On the left lateral decubitus position, the  Pentax pediatric scope was inserted. The patient had been prepped with  2 enemas. We advanced to 35-40 cm with solid stool there. There was no  liquid stool. We sucked through the scope. The mucosa was slightly  thickened, possibly from enemas. There were no pseudomembranes, no  ulcerations, and no gross active colitis. Several random biopsies were  taken. Scope was withdrawn and the patient tolerated procedure well.  ASSESSMENT: Basically normal flexible sigmoidoscopy and biopsy with no  signs of pseudomembranes or gross colitis.  Pathology - TRACE MELANOSIS COLI; OTHERWISE HISTOLOGICALLY NORMAL COLONIC MUCOSA. - NEGATIVE FOR MORPHOLOGIC FEATURES OF IDIOPATHIC INFLAMMATORY BOWEL DISEASE, MICROSCOPIC COLITIS OR INFECTIOUS COLITIS. - NEGATIVE FOR DYSPLASIA.  Colonoscopy by Dr Loreta Ave before 2010.  Unable to locate records  IMPRESSION:   * Tarry stools, FOBT+ and anemia.  Rule out ulcer, AVMs....   *  Abscess on buttock.  Dr Donell Beers I and Dd this 1/21.  On Vanc and Zosyn.   *  Stable pancreatic cystic lesion, c/w 2011.    *  Melanosis coli on flex sig biopsy in 2011.   *  CKD, now worse.  This may be contributing to anemia.  MCV normal.   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 Memory currently fairly intact. Previously on "memory" meds but not currently, consequence of not being able to make appts with a PMD  *  Lack of PMD.      PLAN:     *  EGD today   Jennye Moccasin  12/29/2013, 8:40 AM Pager: [phone]

## 2013-12-29 NOTE — Significant Event (Signed)
Locked up patients wallet per patient request and sent to security.

## 2013-12-29 NOTE — Progress Notes (Signed)
80yo female to broaden ABX for perirectal abscess and possible UTI.  Will start Zosyn 2.25g IV Q6H for CrCl ~12 ml/min and monitor CBC, Cx, clinical progression.  Vernard GamblesVeronda Josphine Laffey, PharmD, BCPS 12/29/2013 1:33 AM

## 2013-12-30 ENCOUNTER — Encounter (HOSPITAL_COMMUNITY): Payer: Self-pay | Admitting: Internal Medicine

## 2013-12-30 ENCOUNTER — Encounter: Payer: Self-pay | Admitting: Internal Medicine

## 2013-12-30 DIAGNOSIS — N179 Acute kidney failure, unspecified: Secondary | ICD-10-CM

## 2013-12-30 DIAGNOSIS — A498 Other bacterial infections of unspecified site: Secondary | ICD-10-CM

## 2013-12-30 DIAGNOSIS — N390 Urinary tract infection, site not specified: Secondary | ICD-10-CM

## 2013-12-30 LAB — URINE CULTURE: Colony Count: >=100000

## 2013-12-30 LAB — COMPREHENSIVE METABOLIC PANEL
ALK PHOS: 78 U/L (ref 39–117)
ALT: 8 U/L (ref 0–35)
AST: 13 U/L (ref 0–37)
Albumin: 3.3 g/dL — ABNORMAL LOW (ref 3.5–5.2)
BILIRUBIN TOTAL: 0.2 mg/dL — AB (ref 0.3–1.2)
BUN: 34 mg/dL — ABNORMAL HIGH (ref 6–23)
CHLORIDE: 105 meq/L (ref 96–112)
CO2: 18 mEq/L — ABNORMAL LOW (ref 19–32)
Calcium: 8.7 mg/dL (ref 8.4–10.5)
Creatinine, Ser: 3.28 mg/dL — ABNORMAL HIGH (ref 0.50–1.10)
GFR calc Af Amer: 14 mL/min — ABNORMAL LOW (ref >90)
GFR calc non Af Amer: 12 mL/min — ABNORMAL LOW (ref >90)
Glucose, Bld: 121 mg/dL — ABNORMAL HIGH (ref 70–99)
Potassium: 3.7 mEq/L (ref 3.7–5.3)
SODIUM: 140 meq/L (ref 137–147)
TOTAL PROTEIN: 7.5 g/dL (ref 6.0–8.3)

## 2013-12-30 LAB — CBC WITH DIFFERENTIAL/PLATELET
BASOS ABS: 0 10*3/uL (ref 0.0–0.1)
BASOS PCT: 0 % (ref 0–1)
EOS ABS: 0.2 10*3/uL (ref 0.0–0.7)
Eosinophils Relative: 2 % (ref 0–5)
HCT: 29.6 % — ABNORMAL LOW (ref 36.0–46.0)
Hemoglobin: 9.8 g/dL — ABNORMAL LOW (ref 12.0–15.0)
Lymphocytes Relative: 31 % (ref 12–46)
Lymphs Abs: 2.9 10*3/uL (ref 0.7–4.0)
MCH: 27.3 pg (ref 26.0–34.0)
MCHC: 33.1 g/dL (ref 30.0–36.0)
MCV: 82.5 fL (ref 78.0–100.0)
MONOS PCT: 7 % (ref 3–12)
Monocytes Absolute: 0.7 10*3/uL (ref 0.1–1.0)
NEUTROS PCT: 60 % (ref 43–77)
Neutro Abs: 5.8 10*3/uL (ref 1.7–7.7)
PLATELETS: 350 10*3/uL (ref 150–400)
RBC: 3.59 MIL/uL — ABNORMAL LOW (ref 3.87–5.11)
RDW: 14.7 % (ref 11.5–15.5)
WBC: 9.6 10*3/uL (ref 4.0–10.5)

## 2013-12-30 MED ORDER — HYDROCOD POLST-CHLORPHEN POLST 10-8 MG/5ML PO LQCR
5.0000 mL | Freq: Two times a day (BID) | ORAL | Status: DC | PRN
  Administered 2013-12-30: 5 mL via ORAL
  Filled 2013-12-30: qty 5

## 2013-12-30 MED ORDER — BOOST / RESOURCE BREEZE PO LIQD
1.0000 | Freq: Every day | ORAL | Status: DC
  Administered 2013-12-30: 1 via ORAL

## 2013-12-30 MED ORDER — WHITE PETROLATUM GEL
Status: AC
  Administered 2013-12-30: 0.2
  Filled 2013-12-30: qty 5

## 2013-12-30 NOTE — Progress Notes (Signed)
Daily Rounding Note  12/30/2013, 9:22 AM  LOS: 2 days    OBJECTIVE:         Vital signs in last 24 hours:    Temp:  [97.8 F (36.6 C)-98.3 F (36.8 C)] 97.8 F (36.6 C) (01/23 0300) Pulse Rate:  [67-117] 84 (01/23 0300) Resp:  [12-27] 18 (01/23 0300) BP: (118-238)/(61-115) 165/85 mmHg (01/23 0300) SpO2:  [73 %-100 %] 98 % (01/23 0300) Weight:  [75.1 kg (165 lb 9.1 oz)] 75.1 kg (165 lb 9.1 oz) (01/23 0519) Last BM Date: 12/30/13 Not reexamined   Intake/Output from previous day: 01/22 0701 - 01/23 0700 In: 1282 [P.O.:682; I.V.:600] Out: 175 [Urine:175]  Intake/Output this shift: Total I/O In: 100 [P.O.:100] Out: -   Lab Results:  Recent Labs  12/29/13 0255 12/29/13 0500 12/30/13 0546  WBC 12.2* 9.9 9.6  HGB 10.8* 12.3 9.8*  HCT 32.4* 36.6 29.6*  PLT 387 342 350   BMET  Recent Labs  12/28/13 1250 12/29/13 0258 12/30/13 0546  NA 141 139 140  K 4.3 4.2 3.7  CL 103 102 105  CO2 21 19 18*  GLUCOSE 106* 103* 121*  BUN 40* 36* 34*  CREATININE 3.21* 3.09* 3.28*  CALCIUM 9.8 9.7 8.7   LFT  Recent Labs  12/28/13 1250 12/29/13 0258 12/30/13 0546  PROT 8.3 8.2 7.5  ALBUMIN 3.9 3.8 3.3*  AST 12 14 13   ALT 8 8 8   ALKPHOS 93 96 78  BILITOT 0.3 0.4 0.2*   PT/INR  Recent Labs  12/28/13 1250  LABPROT 12.3  INR 0.93   Hepatitis Panel No results found for this basename: HEPBSAG, HCVAB, HEPAIGM, HEPBIGM,  in the last 72 hours  Studies/Results: Ct Abdomen Pelvis Wo Contrast 12/28/2013     FINDINGS: The lack of intravenous contrast limits the ability to evaluate solid abdominal organs.  Unchanged punctate calcifications within the liver, presumably the sequela of prior granulomatous infection. Normal appearance of the gallbladder. No ascites.  The bilateral kidneys appear atrophic. There is a suspected approximately 1.3 cm hypo attenuating partially exophytic lesion arising from the superior  lateral aspect of the right kidney (image 27, series 5) which appears morphologically unchanged since the 2012 examination no is incompletely evaluated on the present examination. There are multiple nonobstructing renal stones seen bilaterally with dominant nonobstructing stone within the interpolar region of the left kidney measuring approximately 0.7 cm in diameter (coronal image 61) and dominant nonobstructing stone within the superior pole of the right kidney measuring approximately 0.4 cm (coronal image 68). No definite renal stones are seen along the expected course of either ureter or the urinary bladder. Normal noncontrast appearance of the urinary bladder given degree distention. Multiple phleboliths overlie the lower pelvis. Minimal amount of likely age-related perinephric stranding. No urinary obstruction.  Normal noncontrast appearance of the bilateral adrenal gland glands and spleen. Grossly unchanged appearance of approximately 1.7 x 1.4 cm hypo attenuating lesion within the posterior aspect of the pancreatic head (image 32, series 5).  Ingested enteric contrast extends to the level of the distal small bowel. No evidence of enteric obstruction. Colonic diverticulosis without evidence of diverticulitis. There is no definitive rectal wall thickening on this noncontrast examination. No pneumoperitoneum, pneumatosis or portal venous gas. The appendix is not visualized, however there is moderate amount of calcified atherosclerotic plaque within a normal caliber abdominal aorta. Scattered retroperitoneal lymph nodes individually not enlarged by size criteria. No retroperitoneal, mesenteric, pelvic or inguinal  lymphadenopathy. No inflammatory change within the right lower abdominal quadrant.  Post hysterectomy. No discrete adnexal lesion. No free fluid within the pelvis.  Limited visualization of the lower thorax is negative focal airspace opacity or pleural effusion.  Normal heart size.  No pericardial  effusion.  No acute or aggressive osseous abnormalities. Mild to moderate multilevel lumbar spine DDD, likely worse at L3-L4 and L4-L5 with disc space height loss, endplate irregularity and small posteriorly directed disc osteophyte complexes at these locations.  There is ill-defined soft tissue stranding about the medial aspect of the right buttocks measuring approximately 2.1 x 2.5 cm (image 79, series 5) without definitive definable/drainable fluid collection on this noncontrast examination.  IMPRESSION: 1. Ill-defined soft tissue stranding about the medial aspect of the right buttocks without associated definitive definable/drainable fluid collection on this noncontrast examination. Clinical correlation is advised. 2. Colonic diverticulosis without evidence of diverticulitis. No definite evidence of definitive rectal wall thickening on this noncontrast examination. 3. Interval progression of bilateral nonobstructing nephrolithiasis. 4. Suspected approximately 1.3 cm hypo attenuating partially exophytic lesion arising from the superior pole of the right kidney is morphologically unchanged since the 2012 examination and thus favored to represent a renal cyst. 5. The approximately 1.7 cm hypo attenuating lesion within the posterior aspect of the pancreatic head is incompletely evaluated on this noncontrast examination though morphologically unchanged since the 09/2010 examination and thus favored to represent a benign pancreatic cystic lesion.   Electronically Signed   By: Simonne Come M.D.   On: 12/28/2013 16:58    ASSESMENT:   * Tarry stools, FOBT+ and anemia.  EGD 1/22: benign looking prepyloric ulcer, biopsies pending. No GOO.  Needs 6 to 8 week fup EGD.   *  ABL anemia.   .  * Abscess on buttock. Dr Donell Beers I and Dd this 1/21. On Vanc and Zosyn.   * Stable pancreatic cystic lesion, c/w 2011.   * Melanosis coli on flex sig biopsy in 2011.   * CKD, now worse. This may be contributing to anemia.  MCV normal.   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 Memory currently fairly intact. Previously on "memory" meds but not currently, consequence of not being able to make appts with a PMD  * Abscess on buttock. Dr Donell Beers I/Dd this 1/21. On Vanc and Zosyn.   * Stable pancreatic cystic lesion, c/w 2011.   * Melanosis coli on flex sig biopsy in 2011.   * CKD, now worse. This may be contributing to anemia. MCV normal. Stable exophytic lesion right kidney.    * Dementia, previous label of organic brain syndrome and dx normal pressure hydrocepahlus. Memory currently fairly intact. Previously on "memory" meds but not currently, consequence of not being able to make appts with a PMD    PLAN   *  Has GI preop RN visit on 3/4 and EGD set for 3/18 at [facility].  Entered info into discharge navigator.   *  Once daily PPI.  Omeprazole 40 mg daily is fine, or whatever full dose PPI her drug plan provides  *  Needs to be established with a PMD, her PMD retired. Can someone make appropriate arrangements?  She needs follow up CBC in 2 weeks.    Jennye Moccasin  12/30/2013, 9:22 AM Pager: [phone]

## 2013-12-30 NOTE — Progress Notes (Signed)
Patient ID: Monica [name], female   DOB: Sep 29, 1932, 80 y.o.   MRN: [id number]  Buttock abscess remains stable No need for further I and D currently

## 2013-12-30 NOTE — Progress Notes (Signed)
Subjective  " I have problem with insomnia"   Objective  Benign appearing gastric ulcer on EGD yesterday, biopsies pending, stools are natural color now, but Hgb down  To 9.8 Vital signs in last 24 hours: Temp:  [97.8 F (36.6 C)-98.3 F (36.8 C)] 97.8 F (36.6 C) (01/23 0300) Pulse Rate:  [67-117] 84 (01/23 0300) Resp:  [12-27] 18 (01/23 0300) BP: (118-238)/(61-168) 165/85 mmHg (01/23 0300) SpO2:  [73 %-100 %] 98 % (01/23 0300) Weight:  [165 lb 9.1 oz (75.1 kg)] 165 lb 9.1 oz (75.1 kg) (01/23 0519) Last BM Date: 12/30/13 General:    AA female in NAD Heart:  Regular rate and rhythm; no murmurs Lungs: Respirations even and unlabored, lungs CTA bilaterally Abdomen:  Soft, nontender and nondistended. Normal bowel sounds. Extremities:  Without edema. Neurologic:  Alert and oriented,  grossly normal neurologically. Psych:  Cooperative. Normal mood and affect.  Intake/Output from previous day: 01/22 0701 - 01/23 0700 In: 1282 [P.O.:682; I.V.:600] Out: 175 [Urine:175] Intake/Output this shift:    Lab Results:  Recent Labs  12/29/13 0255 12/29/13 0500 12/30/13 0546  WBC 12.2* 9.9 9.6  HGB 10.8* 12.3 9.8*  HCT 32.4* 36.6 29.6*  PLT 387 342 350   BMET  Recent Labs  12/28/13 1250 12/29/13 0258 12/30/13 0546  NA 141 139 140  K 4.3 4.2 3.7  CL 103 102 105  CO2 21 19 18*  GLUCOSE 106* 103* 121*  BUN 40* 36* 34*  CREATININE 3.21* 3.09* 3.28*  CALCIUM 9.8 9.7 8.7   LFT  Recent Labs  12/30/13 0546  PROT 7.5  ALBUMIN 3.3*  AST 13  ALT 8  ALKPHOS 78  BILITOT 0.2*   PT/INR  Recent Labs  12/28/13 1250  LABPROT 12.3  INR 0.93    Studies/Results: Ct Abdomen Pelvis Wo Contrast  12/28/2013   CLINICAL DATA:  Mid upper abdominal pain, perianal/rectal pain, abscess  EXAM: CT ABDOMEN AND PELVIS WITHOUT CONTRAST  TECHNIQUE: Multidetector CT imaging of the abdomen and pelvis was performed following the standard protocol without intravenous contrast.  COMPARISON:   CT abdomen pelvis - 08/22/2011  FINDINGS: The lack of intravenous contrast limits the ability to evaluate solid abdominal organs.  Unchanged punctate calcifications within the liver, presumably the sequela of prior granulomatous infection. Normal appearance of the gallbladder. No ascites.  The bilateral kidneys appear atrophic. There is a suspected approximately 1.3 cm hypo attenuating partially exophytic lesion arising from the superior lateral aspect of the right kidney (image 27, series 5) which appears morphologically unchanged since the 2012 examination no is incompletely evaluated on the present examination. There are multiple nonobstructing renal stones seen bilaterally with dominant nonobstructing stone within the interpolar region of the left kidney measuring approximately 0.7 cm in diameter (coronal image 61) and dominant nonobstructing stone within the superior pole of the right kidney measuring approximately 0.4 cm (coronal image 68). No definite renal stones are seen along the expected course of either ureter or the urinary bladder. Normal noncontrast appearance of the urinary bladder given degree distention. Multiple phleboliths overlie the lower pelvis. Minimal amount of likely age-related perinephric stranding. No urinary obstruction.  Normal noncontrast appearance of the bilateral adrenal gland glands and spleen. Grossly unchanged appearance of approximately 1.7 x 1.4 cm hypo attenuating lesion within the posterior aspect of the pancreatic head (image 32, series 5).  Ingested enteric contrast extends to the level of the distal small bowel. No evidence of enteric obstruction. Colonic diverticulosis without evidence of diverticulitis. There  is no definitive rectal wall thickening on this noncontrast examination. No pneumoperitoneum, pneumatosis or portal venous gas. The appendix is not visualized, however there is moderate amount of calcified atherosclerotic plaque within a normal caliber abdominal  aorta. Scattered retroperitoneal lymph nodes individually not enlarged by size criteria. No retroperitoneal, mesenteric, pelvic or inguinal lymphadenopathy. No inflammatory change within the right lower abdominal quadrant.  Post hysterectomy. No discrete adnexal lesion. No free fluid within the pelvis.  Limited visualization of the lower thorax is negative focal airspace opacity or pleural effusion.  Normal heart size.  No pericardial effusion.  No acute or aggressive osseous abnormalities. Mild to moderate multilevel lumbar spine DDD, likely worse at L3-L4 and L4-L5 with disc space height loss, endplate irregularity and small posteriorly directed disc osteophyte complexes at these locations.  There is ill-defined soft tissue stranding about the medial aspect of the right buttocks measuring approximately 2.1 x 2.5 cm (image 79, series 5) without definitive definable/drainable fluid collection on this noncontrast examination.  IMPRESSION: 1. Ill-defined soft tissue stranding about the medial aspect of the right buttocks without associated definitive definable/drainable fluid collection on this noncontrast examination. Clinical correlation is advised. 2. Colonic diverticulosis without evidence of diverticulitis. No definite evidence of definitive rectal wall thickening on this noncontrast examination. 3. Interval progression of bilateral nonobstructing nephrolithiasis. 4. Suspected approximately 1.3 cm hypo attenuating partially exophytic lesion arising from the superior pole of the right kidney is morphologically unchanged since the 2012 examination and thus favored to represent a renal cyst. 5. The approximately 1.7 cm hypo attenuating lesion within the posterior aspect of the pancreatic head is incompletely evaluated on this noncontrast examination though morphologically unchanged since the 09/2010 examination and thus favored to represent a benign pancreatic cystic lesion.   Electronically Signed   By: Simonne Come  M.D.   On: 12/28/2013 16:58       Assessment / Plan:    Tarry stools, FOBT+ and anemia., cleared up, but Hgb down to 9.8  Rule out ulcer, AVMs....  * Abscess on buttock. Dr Donell Beers I and Dd this 1/21. On Vanc and Zosyn.  * Stable pancreatic cystic lesion, c/w 2011.  * Melanosis coli on flex sig biopsy in 2011.  * CKD, now worse. This may be contributing to anemia. MCV normal.  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 Memory currently fairly intact. Previously on "memory" meds but not currently, consequence of not being able to make appts with a PMD  Diet advanced, continue PPI,  Will sign off.     Active Problems:   Upper GI bleed   Abscess of buttock   HTN (hypertension)   PAF (paroxysmal atrial fibrillation)   Melena   CKD (chronic kidney disease), stage IV   Leukocytosis   Escherichia coli urinary tract infection   Gastric ulcer with hemorrhage     LOS: 2 days   Lina Sar  12/30/2013, 8:24 AM

## 2013-12-30 NOTE — Care Management Note (Signed)
    Page 1 of 1   12/30/2013     4:49:00 PM   CARE MANAGEMENT NOTE 12/30/2013  Patient:  Monica [name],Monica [name]   Account Number:  [id number]  Date Initiated:  12/30/2013  Documentation initiated by:  Letha CapeAYLOR,Caton Popowski  Subjective/Objective Assessment:   dx uper gib  admit- from home.     Action/Plan:   Anticipated DC Date:  12/31/2013   Anticipated DC Plan:  HOME W HOME HEALTH SERVICES      DC Planning Services  CM consult      Choice offered to / List presented to:             Status of service:  In process, will continue to follow Medicare Important Message given?   (If response is "NO", the following Medicare IM given date fields will be blank) Date Medicare IM given:   Date Additional Medicare IM given:    Discharge Disposition:    Per UR Regulation:  Reviewed for med. necessity/level of care/duration of stay  If discussed at Long Length of Stay Meetings, dates discussed:    Comments:

## 2013-12-30 NOTE — Progress Notes (Signed)
INITIAL NUTRITION ASSESSMENT  DOCUMENTATION CODES Per approved criteria  -Obesity Unspecified   INTERVENTION: [employer] Breeze po daily, each supplement provides 250 kcal and 9 grams of protein RD to follow for nutrition care plan  NUTRITION DIAGNOSIS: Inadequate oral intake related to GI distress, decreased appetite as evidenced by PO intake 25%  Goal: Pt to meet >/= 90% of their estimated nutrition needs   Monitor:  PO & supplemental intake, weight, labs, I/O's  Reason for Assessment: Malnutrition Screening Tool Report  80 y.o. female  Admitting Dx: R buttock abscess   ASSESSMENT: Patient with PMH of HTN and CKD; presented to ER with complaints of black tarry stools for 2 weeks and pain at left buttock region; CT of abdomen and pelvis revealed soft tissue stranding consistent with abscess.   Patient reports her appetite is "pretty good;" complaining of GI distress and abdominal pain; PO intake variable at 25-75% per flowsheet records; also reports 14 lb weight loss in the past 3 months.  Patient reports she eats well at home and is unable to attribute her weight loss to anything in particular; feel she would benefit from supplement; amenable to [employer] Breeze daily; RD to order.  Nutrition focused physical exam completed.  No significant muscle or subcutaneous fat depletion noticed.  Height: Ht Readings from Last 1 Encounters:  12/28/13 5' (1.524 m)    Weight: Wt Readings from Last 1 Encounters:  12/30/13 165 lb 9.1 oz (75.1 kg)    Ideal Body Weight: 100 lb  % Ideal Body Weight: 165%  Wt Readings from Last 20 Encounters:  12/30/13 165 lb 9.1 oz (75.1 kg)  12/30/13 165 lb 9.1 oz (75.1 kg)  12/30/13 165 lb 9.1 oz (75.1 kg)    Usual Body Weight: 180 lb  % Usual Body Weight: 92%  BMI:  Body mass index is 32.33 kg/(m^2).  Estimated Nutritional Needs: Kcal: 1700-1900 Protein: 80-90 gm Fluid: 1.7-1.9 L  Skin: abscess to buttocks  Diet Order:  General  EDUCATION NEEDS: -No education needs identified at this time   Intake/Output Summary (Last 24 hours) at 12/30/13 1321 Last data filed at 12/30/13 0907  Gross per 24 hour  Intake    782 ml  Output      0 ml  Net    782 ml    Labs:   Recent Labs Lab 12/28/13 1250 12/29/13 0258 12/30/13 0546  NA 141 139 140  K 4.3 4.2 3.7  CL 103 102 105  CO2 21 19 18*  BUN 40* 36* 34*  CREATININE 3.21* 3.09* 3.28*  CALCIUM 9.8 9.7 8.7  GLUCOSE 106* 103* 121*    Scheduled Meds: . pantoprazole  40 mg Oral Q0600  . piperacillin-tazobactam (ZOSYN)  IV  2.25 g Intravenous Q6H  . sodium chloride  3 mL Intravenous Q12H  . vancomycin  750 mg Intravenous Q48H    Continuous Infusions:   Past Medical History  Diagnosis Date  . Hypertension   . Renal insufficiency   . ETOHism   . GERD (gastroesophageal reflux disease)   . Arthritis   . Atrial flutter   . Chronic renal failure   . Hemorrhoids   . S/P appy   . S/P tubal ligation   . Dementia     Past Surgical History  Procedure Laterality Date  . Esophagogastroduodenoscopy N/A 12/29/2013    Procedure: ESOPHAGOGASTRODUODENOSCOPY (EGD);  Surgeon: Hart Carwinora M Brodie, MD;  [facility];  Service: Endoscopy;  Laterality: N/A;    Maureen ChattersKatie Aliviana Burdell, RD, LDN  Pager #: [phone] After-Hours Pager #: [phone]

## 2013-12-30 NOTE — Progress Notes (Signed)
Monica LimesMary S [name] WUJ:[id number] DOB: 05/10/1932 DOA: 12/28/2013 PCP: No PCP Per Patient   HPI/Subjective: Patient anxious want to go home.  Brief narrative: 80 year old female patient with multiple medical problems including hypertension and chronic kidney disease. Remote history of alcoholism-/current in the 1980s. Presented to the ER with complaints of black tarry stools for 2 weeks. Denied aspirin CAD use. No prior blood per rectum and no nausea or vomiting. In addition patient reported pain of the left buttock region for 4-5 days that has become increasingly more tender with palpation and touch. No fevers or chills reported.  In the ER she was noted to be Hemoccult positive on exam. She had a mild leukocytosis with a white count of 13,000. Hemoglobin stable at 10.7. CT of the abdomen and pelvis did not reveal a perirectal abscess but did reveal soft tissue stranding consistent with likely abscess. Urinalysis was mildly abnormal. The abscess was I/D'd at the bedside by the ER physician with cultures being obtained. The patient was started on empiric vancomycin and Zosyn as well as blood cultures. Surgery was consulted.  Assessment/Plan: Active Problems:   Presumed Upper GI bleed/Melena -Appreciate GI assistance -EGD showed a benign-appearing prepyloric ulcer measuring 15-20 mm. -Hemoglobin stable -Patient endorsed stools have changed to a grayish color/LFTs are normal    Abscess of the right buttock -Appreciate surgery assistance -Wound draining and improving in appearance so hopeful no additional drainage procedures indicated -Continue empiric antibiotics  Anemia -Patient has chronic anemia secondary to CKD, with acute on chronic anemia secondary to GI bleed. -No blood transfusion so far.    HTN (hypertension) -Moderate control -No orthostasis so doubt volume depleted -Since n.p.o. we'll continue fluids at 40 cc per hour but once can [facility] -Was not  on any hypertensive medications prior to admission but likely needs to be started on medications at this time especially given poor renal function -According to 2012 discharge summary the patient was supposed to be on either quinapril or lisinopril, every other day Lasix, and carvedilol -Does not appear as if patient has primary care provider    Leukocytosis -Resolved after I&D procedure and initiation of antibiotics   Escherichia coli urinary tract infection -Pansensitive Escherichia coli, probably patient can be discharged on oral antibiotics. -Susceptibility on the staph aureus is pending, hopefully can be sensitive to the same antibiotic.    PAF (paroxysmal atrial fibrillation) -Currently maintaining normal sinus rhythm    CKD (chronic kidney disease), stage IV -BUN was 47 and creatinine 2.64 and 2012 noting at time of presentation for that admission she was dehydrated and her creatinine was 4.7 -BMP showed creatinine of 3.2, this is improved since admission.   DVT prophylaxis: SCDs Code Status: Full Family Communication: No family at bedside Disposition Plan/Expected LOS: Remains inpatient   Consultants: Gastroenterology  Procedures: -EGD showed a benign-appearing prepyloric ulcer measuring 15-20 mm.  Antibiotics: Zosyn Vancomycin   Objective: Blood pressure 165/85, pulse 84, temperature 97.8 F (36.6 C), temperature source Oral, resp. rate 18, height 5' (1.524 m), weight 75.1 kg (165 lb 9.1 oz), SpO2 98.00%.  Intake/Output Summary (Last 24 hours) at 12/30/13 1058 Last data filed at 12/30/13 [id number]  Gross per 24 hour  Intake   1082 ml  Output      0 ml  Net   1082 ml     Exam: General: No acute respiratory distress Lungs: Clear to auscultation bilaterally without wheezes or crackles, RA Cardiovascular: Regular rate  and rhythm without murmur gallop or rub normal S1 and S2, no peripheral edema or JVD-hypertensive Abdomen: Nontender, nondistended, soft, bowel  sounds positive, no rebound, no ascites, no appreciable mass Musculoskeletal: No significant cyanosis, clubbing of bilateral lower extremities Neurological: Alert and oriented x 3, moves all extremities x 4 without focal neurological deficits, CN 2-12 intact  Scheduled Meds:  Scheduled Meds: . pantoprazole  40 mg Oral Q0600  . piperacillin-tazobactam (ZOSYN)  IV  2.25 g Intravenous Q6H  . sodium chloride  3 mL Intravenous Q12H  . vancomycin  750 mg Intravenous Q48H   Continuous Infusions: . sodium chloride 50 mL/hr (12/30/13 1055)    Data Reviewed: Basic Metabolic Panel:  Recent Labs Lab 12/28/13 1250 12/29/13 0258 12/30/13 0546  NA 141 139 140  K 4.3 4.2 3.7  CL 103 102 105  CO2 21 19 18*  GLUCOSE 106* 103* 121*  BUN 40* 36* 34*  CREATININE 3.21* 3.09* 3.28*  CALCIUM 9.8 9.7 8.7   Liver Function Tests:  Recent Labs Lab 12/28/13 1250 12/29/13 0258 12/30/13 0546  AST 12 14 13   ALT 8 8 8   ALKPHOS 93 96 78  BILITOT 0.3 0.4 0.2*  PROT 8.3 8.2 7.5  ALBUMIN 3.9 3.8 3.3*    Recent Labs Lab 12/28/13 1250  LIPASE 67*   No results found for this basename: AMMONIA,  in the last 168 hours CBC:  Recent Labs Lab 12/28/13 1250 12/28/13 1856 12/29/13 0255 12/29/13 0500 12/30/13 0546  WBC 13.2* 12.7* 12.2* 9.9 9.6  NEUTROABS 9.5*  --   --  6.4 5.8  HGB 10.7* 10.3* 10.8* 12.3 9.8*  HCT 32.4* 31.1* 32.4* 36.6 29.6*  MCV 84.4 83.8 83.7 83.4 82.5  PLT 354 341 387 342 350   Cardiac Enzymes:  Recent Labs Lab 12/28/13 1250  TROPONINI <0.30   BNP (last 3 results) No results found for this basename: PROBNP,  in the last 8760 hours CBG: No results found for this basename: GLUCAP,  in the last 168 hours  Recent Results (from the past 240 hour(s))  URINE CULTURE     Status: None   Collection Time    12/28/13 12:36 PM      Result Value Range Status   Specimen Description URINE, CLEAN CATCH   Final   Special Requests NONE   Final   Culture  Setup Time      Final   Value: 12/28/2013 20:19     Performed at [facility] Count     Final   Value: >=100,000 COLONIES/ML     Performed at [facility]   Culture     Final   Value: ESCHERICHIA COLI     Performed at [facility]   Report Status 12/30/2013 FINAL   Final   Organism ID, Bacteria ESCHERICHIA COLI   Final  CULTURE, BLOOD (ROUTINE X 2)     Status: None   Collection Time    12/28/13  4:53 PM      Result Value Range Status   Specimen Description BLOOD RIGHT HAND   Final   Special Requests BOTTLES DRAWN AEROBIC AND ANAEROBIC 5CC   Final   Culture  Setup Time     Final   Value: 12/28/2013 23:16     Performed at [facility]   Culture     Final   Value:        BLOOD CULTURE RECEIVED NO GROWTH TO DATE CULTURE WILL BE HELD FOR  5 DAYS BEFORE ISSUING A FINAL NEGATIVE REPORT     Performed at [facility]   Report Status PENDING   Incomplete  CULTURE, BLOOD (ROUTINE X 2)     Status: None   Collection Time    12/28/13  5:04 PM      Result Value Range Status   Specimen Description BLOOD LEFT HAND   Final   Special Requests BOTTLES DRAWN AEROBIC ONLY 5CC   Final   Culture  Setup Time     Final   Value: 12/28/2013 23:16     Performed at [facility]   Culture     Final   Value:        BLOOD CULTURE RECEIVED NO GROWTH TO DATE CULTURE WILL BE HELD FOR 5 DAYS BEFORE ISSUING A FINAL NEGATIVE REPORT     Performed at [facility]   Report Status PENDING   Incomplete  CULTURE, ROUTINE-ABSCESS     Status: None   Collection Time    12/28/13  5:19 PM      Result Value Range Status   Specimen Description ABSCESS PERIRECTAL   Final   Special Requests NONE   Final   Gram Stain     Final   Value: NO WBC SEEN     NO SQUAMOUS EPITHELIAL CELLS SEEN     NO ORGANISMS SEEN     Performed at [facility]   Culture     Final   Value: FEW STAPHYLOCOCCUS AUREUS     Note: RIFAMPIN AND GENTAMICIN SHOULD NOT BE USED AS SINGLE DRUGS FOR  TREATMENT OF STAPH INFECTIONS.     Performed at [facility]   Report Status PENDING   Incomplete  MRSA PCR SCREENING     Status: Abnormal   Collection Time    12/28/13 10:13 PM      Result Value Range Status   MRSA by PCR POSITIVE (*) NEGATIVE Final   Comment:            The GeneXpert MRSA Assay (FDA     approved for NASAL specimens     only), is one component of a     comprehensive MRSA colonization     surveillance program. It is not     intended to diagnose MRSA     infection nor to guide or     monitor treatment for     MRSA infections.     RESULT CALLED TO, READ BACK BY AND VERIFIED WITH:     Bronson Curb 12/29/13 0438 BY RHOLMES     Studies:  Recent x-ray studies have been reviewed in detail by the Attending Physician  Time spent :     Junious Silk, ANP [facility] Office  [phone] Pager [phone]  **If unable to reach the above provider after paging please contact the Flow Manager @ [phone]  On-Call/Text Page:      [email or web address]      password [email or web address]  If 7PM-7AM, please contact night-coverage [email or web address] Password [facility] 12/30/2013, 10:58 AM   LOS: 2 days   I have examined the patient, reviewed the chart and modified the above note which I agree with.   [facility] A,MD [phone] 12/30/2013, 10:58 AM

## 2013-12-31 DIAGNOSIS — I10 Essential (primary) hypertension: Secondary | ICD-10-CM

## 2013-12-31 DIAGNOSIS — K612 Anorectal abscess: Secondary | ICD-10-CM

## 2013-12-31 DIAGNOSIS — R109 Unspecified abdominal pain: Secondary | ICD-10-CM

## 2013-12-31 DIAGNOSIS — K254 Chronic or unspecified gastric ulcer with hemorrhage: Principal | ICD-10-CM

## 2013-12-31 DIAGNOSIS — D72829 Elevated white blood cell count, unspecified: Secondary | ICD-10-CM

## 2013-12-31 DIAGNOSIS — N184 Chronic kidney disease, stage 4 (severe): Secondary | ICD-10-CM

## 2013-12-31 LAB — CULTURE, ROUTINE-ABSCESS: GRAM STAIN: NONE SEEN

## 2013-12-31 LAB — BASIC METABOLIC PANEL
BUN: 29 mg/dL — AB (ref 6–23)
CALCIUM: 8.8 mg/dL (ref 8.4–10.5)
CO2: 19 meq/L (ref 19–32)
CREATININE: 3.32 mg/dL — AB (ref 0.50–1.10)
Chloride: 107 mEq/L (ref 96–112)
GFR calc Af Amer: 14 mL/min — ABNORMAL LOW (ref >90)
GFR, EST NON AFRICAN AMERICAN: 12 mL/min — AB (ref >90)
GLUCOSE: 105 mg/dL — AB (ref 70–99)
Potassium: 3.9 mEq/L (ref 3.7–5.3)
SODIUM: 141 meq/L (ref 137–147)

## 2013-12-31 LAB — CBC
HCT: 27.2 % — ABNORMAL LOW (ref 36.0–46.0)
Hemoglobin: 9 g/dL — ABNORMAL LOW (ref 12.0–15.0)
MCH: 27.1 pg (ref 26.0–34.0)
MCHC: 33.1 g/dL (ref 30.0–36.0)
MCV: 81.9 fL (ref 78.0–100.0)
PLATELETS: 330 10*3/uL (ref 150–400)
RBC: 3.32 MIL/uL — AB (ref 3.87–5.11)
RDW: 14.4 % (ref 11.5–15.5)
WBC: 7.1 10*3/uL (ref 4.0–10.5)

## 2013-12-31 MED ORDER — METOPROLOL TARTRATE 25 MG PO TABS: 25.0000 mg | ORAL_TABLET | Freq: Two times a day (BID) | ORAL | Status: DC

## 2013-12-31 MED ORDER — CIPROFLOXACIN HCL 500 MG PO TABS: 500.0000 mg | ORAL_TABLET | Freq: Two times a day (BID) | ORAL | Status: DC

## 2013-12-31 MED ORDER — PANTOPRAZOLE SODIUM 40 MG PO TBEC: 40.0000 mg | DELAYED_RELEASE_TABLET | Freq: Every day | ORAL | Status: DC

## 2013-12-31 MED ORDER — DOXYCYCLINE HYCLATE 100 MG PO TABS: 100.0000 mg | ORAL_TABLET | Freq: Two times a day (BID) | ORAL | Status: DC

## 2013-12-31 NOTE — Progress Notes (Addendum)
Patient ID: Monica [name], female   DOB: [birth year], 80 y.o.   MRN: [id number] Lab update culture is MRSA so would send home on Septra DS for 7d instead of the Augmentin. Violeta GelinasBurke Alba Kriesel, MD, MPH, FACS Pager: [phone]

## 2013-12-31 NOTE — Discharge Summary (Signed)
Physician Discharge Summary  VORA CLOVER ZOX:[id number] DOB: 12/06/1932 DOA: 12/28/2013  PCP: No PCP Per Patient  Admit date: 12/28/2013 Discharge date: 12/31/2013  Time spent: 40 minutes  Recommendations for Outpatient Follow-up:  1. Followup with primary care physician in one week. 2. To followup with [facility]  Discharge Diagnoses:  Active Problems:   Upper GI bleed   Abscess of buttock   HTN (hypertension)   PAF (paroxysmal atrial fibrillation)   Melena   CKD (chronic kidney disease), stage IV   Leukocytosis   Escherichia coli urinary tract infection   Gastric ulcer with hemorrhage   Discharge Condition: Stable  Diet recommendation: Heart healthy  Filed Weights   12/28/13 2156 12/29/13 0400 12/30/13 0519  Weight: 72.7 kg (160 lb 4.4 oz) 72.7 kg (160 lb 4.4 oz) 75.1 kg (165 lb 9.1 oz)    History of present illness:  80 year old female patient with multiple medical problems including hypertension and chronic kidney disease. Remote history of alcoholism-/current in the 1980s. Presented to the ER with complaints of black tarry stools for 2 weeks. Denied aspirin CAD use. No prior blood per rectum and no nausea or vomiting. In addition patient reported pain of the left buttock region for 4-5 days that has become increasingly more tender with palpation and touch. No fevers or chills reported.  In the ER she was noted to be Hemoccult positive on exam. She had a mild leukocytosis with a white count of 13,000. Hemoglobin stable at 10.7. CT of the abdomen and pelvis did not reveal a perirectal abscess but did reveal soft tissue stranding consistent with likely abscess. Urinalysis was mildly abnormal. The abscess was I/D'd at the bedside by the ER physician with cultures being obtained. The patient was started on empiric vancomycin and Zosyn as well as blood cultures. Surgery was consulted.  Hospital Course:   1. Presumed upper GI bleed: At the time of admission to the hospital  hemoglobin was 10.7 patient has positive Hemoccult, GI was called for and EGD was done and showed benign-appearing prepyloric ulcer measuring 15-20 mm. Hemoglobin stable, patient did not have an a lot of bleeding. Protonix prescription given the time of discharge.  2. Abscess of the right buttock: CT scan of the time of admission showed perirectal abscess, general surgery consulted and incision and drainage was done by Dr. Donell Beers, the culture showed MRSA and patient was on vancomycin in the hospital discharge on doxycycline for 7 more days.  3. Anemia: Patient has chronic anemia secondary to CKD and she had acute on chronic anemia secondary to GI bleed, no blood transfusions were needed during this hospital stay.  4. Hypertension: Blood pressure in the high side, patient was on lisinopril in 2012, this time when she came in to the hospital she was not on and blood pressure medications. The time of discharge patient is on 25 mg of metoprolol twice a day. Patient said she wants to change her primary care physician as being sometime since she swallows. She is to followup with Dr. Leretha Dykes who retired.   5. Escherichia coli UTI: Pansensitive Escherichia coli, patient was on Zosyn while in the hospital discharge on ciprofloxacin for 3 more days.  6. CKD stage IV: Patient has chronic kidney disease, creatinine is 3.3 at the time of discharge, as mentioned above 3 years ago patient was on lisinopril, unclear why she's not anymore. Patient to followup with primary care physician for further medications adjustment  Procedures:  EGD done on the 12/29/2013 by  Dr. Dickie La and showed prepyloric ulcer measuring 15-20 mm note gastric outlet obstruction.  Consultations:  GI  Cardiology  General surgery  Discharge Exam: Filed Vitals:   12/31/13 0512  BP: 164/83  Pulse: 66  Temp: 98.2 F (36.8 C)  Resp: 18   General: Alert and awake, oriented x3, not in any acute distress. HEENT: anicteric sclera, pupils  reactive to light and accommodation, EOMI CVS: S1-S2 clear, no murmur rubs or gallops Chest: clear to auscultation bilaterally, no wheezing, rales or rhonchi Abdomen: soft nontender, nondistended, normal bowel sounds, no organomegaly Extremities: no cyanosis, clubbing or edema noted bilaterally Neuro: Cranial nerves II-XII intact, no focal neurological deficits  Discharge Instructions   Future Appointments Provider Department Dept Phone   02/08/2014 1:00 PM [facility] [phone]   02/22/2014 1:30 PM Hart Carwin, MD [facility] [phone]       Medication List         acetaminophen 500 MG tablet  Commonly known as:  TYLENOL  Take 1,000 mg by mouth every 6 (six) hours as needed for mild pain.     ciprofloxacin 500 MG tablet  Commonly known as:  CIPRO  Take 1 tablet (500 mg total) by mouth 2 (two) times daily.     doxycycline 100 MG tablet  Commonly known as:  VIBRA-TABS  Take 1 tablet (100 mg total) by mouth 2 (two) times daily.     pantoprazole 40 MG tablet  Commonly known as:  PROTONIX  Take 1 tablet (40 mg total) by mouth daily at 6 (six) AM.     VISINE OP  Place 2 drops into both eyes daily as needed (dry eyes).       Allergies  Allergen Reactions  . Aspirin Other (See Comments)    "makes my stomach hurt"  . Codeine Other (See Comments)    "i get irritated"       Follow-up Information   Follow up with [facility] On 02/08/2014. (1 PM.  visit with GI nurse: preop visit.  bring all your meds and your insurance cards with you to visit. )    Specialty:  Gastroenterology   Contact information:   [address] [place] [address] [phone]      Follow up with [facility] On 02/22/2014. (12:30 arrival for 1:30 endoscopy.  bring a driver with you as you will not be able to drive yourself home. )    Specialty:  Gastroenterology    Contact information:   [address] [place] [address] [phone]      Follow up with [facility] [place], MD. Schedule an appointment as soon as possible for a visit in 1 week.   Specialty:  General Surgery   Contact information:   [address] [place] [address] [phone]        The results of significant diagnostics from this hospitalization (including imaging, microbiology, ancillary and laboratory) are listed below for reference.    Significant Diagnostic Studies: Ct Abdomen Pelvis Wo Contrast  12/28/2013   CLINICAL DATA:  Mid upper abdominal pain, perianal/rectal pain, abscess  EXAM: CT ABDOMEN AND PELVIS WITHOUT CONTRAST  TECHNIQUE: Multidetector CT imaging of the abdomen and pelvis was performed following the standard protocol without intravenous contrast.  COMPARISON:  CT abdomen pelvis - 08/22/2011  FINDINGS: The lack of intravenous contrast limits the ability to evaluate solid abdominal organs.  Unchanged punctate calcifications within the liver,  presumably the sequela of prior granulomatous infection. Normal appearance of the gallbladder. No ascites.  The bilateral kidneys appear atrophic. There is a suspected approximately 1.3 cm hypo attenuating partially exophytic lesion arising from the superior lateral aspect of the right kidney (image 27, series 5) which appears morphologically unchanged since the 2012 examination no is incompletely evaluated on the present examination. There are multiple nonobstructing renal stones seen bilaterally with dominant nonobstructing stone within the interpolar region of the left kidney measuring approximately 0.7 cm in diameter (coronal image 61) and dominant nonobstructing stone within the superior pole of the right kidney measuring approximately 0.4 cm (coronal image 68). No definite renal stones are seen along the expected course of either ureter or the urinary bladder. Normal noncontrast appearance of the urinary  bladder given degree distention. Multiple phleboliths overlie the lower pelvis. Minimal amount of likely age-related perinephric stranding. No urinary obstruction.  Normal noncontrast appearance of the bilateral adrenal gland glands and spleen. Grossly unchanged appearance of approximately 1.7 x 1.4 cm hypo attenuating lesion within the posterior aspect of the pancreatic head (image 32, series 5).  Ingested enteric contrast extends to the level of the distal small bowel. No evidence of enteric obstruction. Colonic diverticulosis without evidence of diverticulitis. There is no definitive rectal wall thickening on this noncontrast examination. No pneumoperitoneum, pneumatosis or portal venous gas. The appendix is not visualized, however there is moderate amount of calcified atherosclerotic plaque within a normal caliber abdominal aorta. Scattered retroperitoneal lymph nodes individually not enlarged by size criteria. No retroperitoneal, mesenteric, pelvic or inguinal lymphadenopathy. No inflammatory change within the right lower abdominal quadrant.  Post hysterectomy. No discrete adnexal lesion. No free fluid within the pelvis.  Limited visualization of the lower thorax is negative focal airspace opacity or pleural effusion.  Normal heart size.  No pericardial effusion.  No acute or aggressive osseous abnormalities. Mild to moderate multilevel lumbar spine DDD, likely worse at L3-L4 and L4-L5 with disc space height loss, endplate irregularity and small posteriorly directed disc osteophyte complexes at these locations.  There is ill-defined soft tissue stranding about the medial aspect of the right buttocks measuring approximately 2.1 x 2.5 cm (image 79, series 5) without definitive definable/drainable fluid collection on this noncontrast examination.  IMPRESSION: 1. Ill-defined soft tissue stranding about the medial aspect of the right buttocks without associated definitive definable/drainable fluid collection on this  noncontrast examination. Clinical correlation is advised. 2. Colonic diverticulosis without evidence of diverticulitis. No definite evidence of definitive rectal wall thickening on this noncontrast examination. 3. Interval progression of bilateral nonobstructing nephrolithiasis. 4. Suspected approximately 1.3 cm hypo attenuating partially exophytic lesion arising from the superior pole of the right kidney is morphologically unchanged since the 2012 examination and thus favored to represent a renal cyst. 5. The approximately 1.7 cm hypo attenuating lesion within the posterior aspect of the pancreatic head is incompletely evaluated on this noncontrast examination though morphologically unchanged since the 09/2010 examination and thus favored to represent a benign pancreatic cystic lesion.   Electronically Signed   By: Simonne Come M.D.   On: 12/28/2013 16:58    Microbiology: Recent Results (from the past 240 hour(s))  URINE CULTURE     Status: None   Collection Time    12/28/13 12:36 PM      Result Value Range Status   Specimen Description URINE, CLEAN CATCH   Final   Special Requests NONE   Final   Culture  Setup Time     Final  Value: 12/28/2013 20:19     Performed at [facility] Count     Final   Value: >=100,000 COLONIES/ML     Performed at [facility]   Culture     Final   Value: ESCHERICHIA COLI     Performed at [facility]   Report Status 12/30/2013 FINAL   Final   Organism ID, Bacteria ESCHERICHIA COLI   Final  CULTURE, BLOOD (ROUTINE X 2)     Status: None   Collection Time    12/28/13  4:53 PM      Result Value Range Status   Specimen Description BLOOD RIGHT HAND   Final   Special Requests BOTTLES DRAWN AEROBIC AND ANAEROBIC 5CC   Final   Culture  Setup Time     Final   Value: 12/28/2013 23:16     Performed at [facility]   Culture     Final   Value:        BLOOD CULTURE RECEIVED NO GROWTH TO DATE CULTURE WILL BE HELD FOR 5 DAYS  BEFORE ISSUING A FINAL NEGATIVE REPORT     Performed at [facility]   Report Status PENDING   Incomplete  CULTURE, BLOOD (ROUTINE X 2)     Status: None   Collection Time    12/28/13  5:04 PM      Result Value Range Status   Specimen Description BLOOD LEFT HAND   Final   Special Requests BOTTLES DRAWN AEROBIC ONLY 5CC   Final   Culture  Setup Time     Final   Value: 12/28/2013 23:16     Performed at [facility]   Culture     Final   Value:        BLOOD CULTURE RECEIVED NO GROWTH TO DATE CULTURE WILL BE HELD FOR 5 DAYS BEFORE ISSUING A FINAL NEGATIVE REPORT     Performed at [facility]   Report Status PENDING   Incomplete  CULTURE, ROUTINE-ABSCESS     Status: None   Collection Time    12/28/13  5:19 PM      Result Value Range Status   Specimen Description ABSCESS PERIRECTAL   Final   Special Requests NONE   Final   Gram Stain     Final   Value: NO WBC SEEN     NO SQUAMOUS EPITHELIAL CELLS SEEN     NO ORGANISMS SEEN     Performed at [facility]   Culture     Final   Value: FEW METHICILLIN RESISTANT STAPHYLOCOCCUS AUREUS     Note: RIFAMPIN AND GENTAMICIN SHOULD NOT BE USED AS SINGLE DRUGS FOR TREATMENT OF STAPH INFECTIONS. CRITICAL RESULT CALLED TO, READ BACK BY AND VERIFIED WITH: JENNIFER Z 1/24 @900  BY REAMM     Performed at [facility]   Report Status 12/31/2013 FINAL   Final   Organism ID, Bacteria METHICILLIN RESISTANT STAPHYLOCOCCUS AUREUS   Final  MRSA PCR SCREENING     Status: Abnormal   Collection Time    12/28/13 10:13 PM      Result Value Range Status   MRSA by PCR POSITIVE (*) NEGATIVE Final   Comment:            The GeneXpert MRSA Assay (FDA     approved for NASAL specimens     only), is one component of a     comprehensive MRSA colonization  surveillance program. It is not     intended to diagnose MRSA     infection nor to guide or     monitor treatment for     MRSA infections.     RESULT CALLED TO, READ BACK BY  AND VERIFIED WITH:     M. Ignacia PalmaVANGELISTA,RN 12/29/13 0438 BY RHOLMES     Labs: Basic Metabolic Panel:  Recent Labs Lab 12/28/13 1250 12/29/13 0258 12/30/13 0546 12/31/13 0545  NA 141 139 140 141  K 4.3 4.2 3.7 3.9  CL 103 102 105 107  CO2 21 19 18* 19  GLUCOSE 106* 103* 121* 105*  BUN 40* 36* 34* 29*  CREATININE 3.21* 3.09* 3.28* 3.32*  CALCIUM 9.8 9.7 8.7 8.8   Liver Function Tests:  Recent Labs Lab 12/28/13 1250 12/29/13 0258 12/30/13 0546  AST 12 14 13   ALT 8 8 8   ALKPHOS 93 96 78  BILITOT 0.3 0.4 0.2*  PROT 8.3 8.2 7.5  ALBUMIN 3.9 3.8 3.3*    Recent Labs Lab 12/28/13 1250  LIPASE 67*   No results found for this basename: AMMONIA,  in the last 168 hours CBC:  Recent Labs Lab 12/28/13 1250 12/28/13 1856 12/29/13 0255 12/29/13 0500 12/30/13 0546 12/31/13 0545  WBC 13.2* 12.7* 12.2* 9.9 9.6 7.1  NEUTROABS 9.5*  --   --  6.4 5.8  --   HGB 10.7* 10.3* 10.8* 12.3 9.8* 9.0*  HCT 32.4* 31.1* 32.4* 36.6 29.6* 27.2*  MCV 84.4 83.8 83.7 83.4 82.5 81.9  PLT 354 341 387 342 350 330   Cardiac Enzymes:  Recent Labs Lab 12/28/13 1250  TROPONINI <0.30   BNP: BNP (last 3 results) No results found for this basename: PROBNP,  in the last 8760 hours CBG: No results found for this basename: GLUCAP,  in the last 168 hours     Signed:  Alcie Runions A  [facility] 12/31/2013, 10:25 AM

## 2013-12-31 NOTE — Progress Notes (Signed)
   CARE MANAGEMENT NOTE 12/31/2013  Patient:  Monica [name],Monica [name]   Account Number:  [id number]  Date Initiated:  12/30/2013  Documentation initiated by:  Letha CapeAYLOR,DEBORAH  Subjective/Objective Assessment:   dx uper gib  admit- from home.     Action/Plan:   discharge planning   Anticipated DC Date:  12/31/2013   Anticipated DC Plan:  HOME W HOME HEALTH SERVICES      DC Planning Services  CM consult      Choice offered to / List presented to:             Status of service:  Completed, signed off Medicare Important Message given?   (If response is "NO", the following Medicare IM given date fields will be blank) Date Medicare IM given:   Date Additional Medicare IM given:    Discharge Disposition:  HOME/SELF CARE  Per UR Regulation:  Reviewed for med. necessity/level of care/duration of stay  If discussed at Long Length of Stay Meetings, dates discussed:    Comments:  12/31/13 11:25 CM spoke with pt in room who states her MD retired Monica [name](Frazier, MD) and she needs a new PCP and does not want to continue with the practice Monica [name](McKenzie, MD).  CM gave pt handouts for PCP resources and reminded her to look on the back of her insurance card when she gets home to see if she needs to choose a [facility]CP within [facility].  Pt verbalized understanding. No other CM needs were communicated.   Monica [name], BSN, CM [phone].

## 2013-12-31 NOTE — Progress Notes (Signed)
2 Days Post-Op  Subjective: Buttock pain resolved  Objective: Vital signs in last 24 hours: Temp:  [98.2 F (36.8 C)-98.5 F (36.9 C)] 98.2 F (36.8 C) (01/24 0512) Pulse Rate:  [66-91] 66 (01/24 0512) Resp:  [18-20] 18 (01/24 0512) BP: (156-164)/(78-85) 164/83 mmHg (01/24 0512) SpO2:  [99 %-100 %] 100 % (01/24 0512) Last BM Date: 12/30/13  Intake/Output from previous day: 01/23 0701 - 01/24 0700 In: 710 [P.O.:460; IV Piggyback:250] Out: -  Intake/Output this shift:    perirectal abscess well drained, minimal SS drainage, cellulitis nearly resolved  Lab Results:   Recent Labs  12/30/13 0546 12/31/13 0545  WBC 9.6 7.1  HGB 9.8* 9.0*  HCT 29.6* 27.2*  PLT 350 330   BMET  Recent Labs  12/30/13 0546 12/31/13 0545  NA 140 141  K 3.7 3.9  CL 105 107  CO2 18* 19  GLUCOSE 121* 105*  BUN 34* 29*  CREATININE 3.28* 3.32*  CALCIUM 8.7 8.8   PT/INR  Recent Labs  12/28/13 1250  LABPROT 12.3  INR 0.93   ABG No results found for this basename: PHART, PCO2, PO2, HCO3,  in the last 72 hours  Studies/Results: No results found.  Anti-infectives: Anti-infectives   Start     Dose/Rate Route Frequency Ordered Stop   12/30/13 1800  vancomycin (VANCOCIN) IVPB 750 mg/150 ml premix     750 mg 150 mL/hr over 60 Minutes Intravenous Every 48 hours 12/28/13 1643     12/29/13 0145  piperacillin-tazobactam (ZOSYN) IVPB 2.25 g     2.25 g 100 mL/hr over 30 Minutes Intravenous 4 times per day 12/29/13 0132     12/28/13 1815  piperacillin-tazobactam (ZOSYN) IVPB 3.375 g     3.375 g 100 mL/hr over 30 Minutes Intravenous  Once 12/28/13 1814 12/28/13 1916   12/28/13 1645  vancomycin (VANCOCIN) IVPB 1000 mg/200 mL premix     1,000 mg 200 mL/hr over 60 Minutes Intravenous  Once 12/28/13 1643 12/28/13 1814      Assessment/Plan: S/P I&D perirectal abscess - OK to go home on oral ABX from our standpoint - recommend Augmentin for 7d. F/U at [facility] 1 week. I will put in D/C  navigator.  LOS: 3 days    Estefan Pattison E 12/31/2013

## 2013-12-31 NOTE — Progress Notes (Signed)
Pt given discharge instructions and prescriptions.  PIV removed.  Pt denied any other needs at this time.  Taken to discharge location via wheelchair.

## 2014-01-02 ENCOUNTER — Encounter: Payer: Self-pay | Admitting: Internal Medicine

## 2014-01-03 LAB — CULTURE, BLOOD (ROUTINE X 2)
CULTURE: NO GROWTH
Culture: NO GROWTH

## 2014-01-28 ENCOUNTER — Inpatient Hospital Stay: Payer: Medicare FFS

## 2014-01-31 ENCOUNTER — Inpatient Hospital Stay: Payer: Medicare FFS

## 2014-02-07 ENCOUNTER — Inpatient Hospital Stay: Payer: Medicare FFS

## 2014-02-14 ENCOUNTER — Ambulatory Visit (AMBULATORY_SURGERY_CENTER): Payer: Self-pay

## 2014-02-14 VITALS — Ht 61.0 in | Wt 161.6 lb

## 2014-02-14 DIAGNOSIS — K259 Gastric ulcer, unspecified as acute or chronic, without hemorrhage or perforation: Secondary | ICD-10-CM

## 2014-02-14 NOTE — Progress Notes (Signed)
Pt came into the office today for her pre-visit prior to her endoscopy with Dr Juanda ChanceBrodie on 02/22/14.I explained prep instructions to pt and her son-Frederick and answered their questions.They will call back if they have any other questions.

## 2014-02-22 ENCOUNTER — Ambulatory Visit (AMBULATORY_SURGERY_CENTER): Payer: Medicare FFS | Admitting: Internal Medicine

## 2014-02-22 ENCOUNTER — Encounter: Payer: Self-pay | Admitting: Internal Medicine

## 2014-02-22 ENCOUNTER — Other Ambulatory Visit: Payer: Self-pay | Admitting: Internal Medicine

## 2014-02-22 VITALS — BP 167/78 | HR 71 | Temp 98.3°F | Resp 20 | Ht 61.0 in | Wt 161.0 lb

## 2014-02-22 DIAGNOSIS — K259 Gastric ulcer, unspecified as acute or chronic, without hemorrhage or perforation: Secondary | ICD-10-CM

## 2014-02-22 DIAGNOSIS — K319 Disease of stomach and duodenum, unspecified: Secondary | ICD-10-CM

## 2014-02-22 MED ORDER — SODIUM CHLORIDE 0.9 % IV SOLN: 500.0000 mL | INTRAVENOUS | Status: DC

## 2014-02-22 NOTE — Patient Instructions (Signed)
YOU HAD AN ENDOSCOPIC PROCEDURE TODAY AT THE [facility]: Refer to the procedure report that was given to you for any specific questions about what was found during the examination.  If the procedure report does not answer your questions, please call your gastroenterologist to clarify.  If you requested that your care partner not be given the details of your procedure findings, then the procedure report has been included in a sealed envelope for you to review at your convenience later.  YOU SHOULD EXPECT: Some feelings of bloating in the abdomen. Passage of more gas than usual.  Walking can help get rid of the air that was put into your GI tract during the procedure and reduce the bloating.  DIET: Your first meal following the procedure should be a light meal and then it is ok to progress to your normal diet.  A half-sandwich or bowl of soup is an example of a good first meal.  Heavy or fried foods are harder to digest and may make you feel nauseous or bloated.  Likewise meals heavy in dairy and vegetables can cause extra gas to form and this can also increase the bloating.  Drink plenty of fluids but you should avoid alcoholic beverages for 24 hours.  ACTIVITY: Your care partner should take you home directly after the procedure.  You should plan to take it easy, moving slowly for the rest of the day.  You can resume normal activity the day after the procedure however you should NOT DRIVE or use heavy machinery for 24 hours (because of the sedation medicines used during the test).    SYMPTOMS TO REPORT IMMEDIATELY: A gastroenterologist can be reached at any hour.  During normal business hours, 8:30 AM to 5:00 PM Monday through Friday, call [phone].  After hours and on weekends, please call the GI answering service at [phone] who will take a message and have the physician on call contact you.   Following upper endoscopy (EGD)  Vomiting of blood or coffee ground material  New  chest pain or pain under the shoulder blades  Painful or persistently difficult swallowing  New shortness of breath  Fever of 100F or higher  Black, tarry-looking stools  FOLLOW UP: If any biopsies were taken you will be contacted by phone or by letter within the next 1-3 weeks.  Call your gastroenterologist if you have not heard about the biopsies in 3 weeks.  Our staff will call the home number listed on your records the next business day following your procedure to check on you and address any questions or concerns that you may have at that time regarding the information given to you following your procedure. This is a courtesy call and so if there is no answer at the home number and we have not heard from you through the emergency physician on call, we will assume that you have returned to your regular daily activities without incident.  SIGNATURES/CONFIDENTIALITY: You and/or your care partner have signed paperwork which will be entered into your electronic medical record.  These signatures attest to the fact that that the information above on your After Visit Summary has been reviewed and is understood.  Full responsibility of the confidentiality of this discharge information lies with you and/or your care-partner.  Await pathology results  Watch for recurrent bleeding  AVOIDS ANTI-INFLAMMATORIES  CONTINUE YOUR CURRENT MEDICATIONS

## 2014-02-22 NOTE — Progress Notes (Signed)
Called to room to assist during endoscopic procedure.  Patient ID and intended procedure confirmed with present staff. Received instructions for my participation in the procedure from the performing physician.  

## 2014-02-22 NOTE — Progress Notes (Signed)
PT. Unable to tell me about medication,she stated no one stays with her the only sure thing she was able to tell me that she take was the tramadol.

## 2014-02-22 NOTE — Progress Notes (Signed)
Lidocaine-40mg IV prior to Propofol InductionPropofol given over incremental dosages 

## 2014-02-22 NOTE — Op Note (Signed)
[facility] [address] [place], [id number]   ENDOSCOPY PROCEDURE REPORT  PATIENT: Monica [name], Monica S.  MR#: [id number] BIRTHDATE: Feb 03, 1932 , 81  yrs. old GENDER: Female ENDOSCOPIST: Hart Carwinora M Hanley Rispoli, MD REFERRED BY:  Dr Doree Albeesteven Newton, Family Medicine PROCEDURE DATE:  02/22/2014 PROCEDURE:  EGD w/ biopsy ASA CLASS:     Class III INDICATIONS:  followup benign gastric ulcer.  Status post upper GI bleed 12/29/2013.  H.  pylori negative gastric ulcer in the prepyloric antrum. MEDICATIONS: MAC sedation, administered by CRNA and propofol (Diprivan) 100mg  IV TOPICAL ANESTHETIC: Cetacaine Spray  DESCRIPTION OF PROCEDURE: After the risks benefits and alternatives of the procedure were thoroughly explained, informed consent was obtained.  The LB [id number] endoscope was introduced through the mouth and advanced to the second portion of the duodenum. Without limitations.  The instrument was slowly withdrawn as the mucosa was fully examined.      [Esophagus: Upper mid and distal esophagus appeared normal. There was no stricture. Z line was unremarkable Stomach: Body of the stomach appeared normal. Gastric antrum showed 90% healing of the prepyloric ulcer. There was a depressed scar in the prepyloric antrum with whitish base to the scar. There was friability surrounding the scar. There was no exudate or stigmata of bleeding. Biopsies were obtained from the center of the ulcer. Gastric outlet was normal retroflexion of the endoscope revealing normal fundus and cardia Duodenum: Duodenal bulb and descending duodenum was normal The scope was then withdrawn from the patient and the procedure completed.  COMPLICATIONS: There were no complications. ENDOSCOPIC IMPRESSION:  90% healing off of benign pyloric gastric ulcer area no stigmata of recent bleeding. Status post biopsies  RECOMMENDATIONS: Await biopsy results Continue PPI once a day Watch for recurrent  bleeding Avoid anti-inflammatory agents,caution if using anticoagulants in the future REPEAT EXAM: for EGD pending biopsy results.  eSigned:  Hart Carwinora M Kalyn Dimattia, MD 02/22/2014 2:25 PM   CC:

## 2014-02-23 ENCOUNTER — Telehealth: Payer: Self-pay | Admitting: *Deleted

## 2014-02-23 NOTE — Telephone Encounter (Signed)
  Follow up Call-  Call back number 02/22/2014  Post procedure Call Back phone  # [phone]  Permission to leave phone message Yes   Spoke with pt's son, she was sleeping  Patient questions:  Do you have a fever, pain , or abdominal swelling? no Pain Score  0 *  Have you tolerated food without any problems? yes  Have you been able to return to your normal activities? yes  Do you have any questions about your discharge instructions: Diet   no Medications  no Follow up visit  no  Do you have questions or concerns about your Care? no  Actions: * If pain score is 4 or above: No action needed, pain <4.

## 2014-02-28 ENCOUNTER — Encounter: Payer: Self-pay | Admitting: Internal Medicine

## 2014-03-01 ENCOUNTER — Encounter: Payer: Self-pay | Admitting: *Deleted

## 2014-04-26 ENCOUNTER — Other Ambulatory Visit: Payer: Self-pay | Admitting: Family Medicine

## 2014-04-26 DIAGNOSIS — R7989 Other specified abnormal findings of blood chemistry: Secondary | ICD-10-CM

## 2014-04-28 ENCOUNTER — Ambulatory Visit
Admission: RE | Admit: 2014-04-28 | Discharge: 2014-04-28 | Disposition: A | Discharge status: Home / Self Care | Payer: Commercial Managed Care - HMO | Source: Ambulatory Visit | Attending: Family Medicine | Admitting: Family Medicine

## 2014-04-28 DIAGNOSIS — R7989 Other specified abnormal findings of blood chemistry: Secondary | ICD-10-CM

## 2015-03-15 ENCOUNTER — Inpatient Hospital Stay (HOSPITAL_COMMUNITY)
Admission: EM | Admit: 2015-03-15 | Discharge: 2015-03-21 | DRG: 357 | Disposition: A | Discharge status: Home / Self Care | Payer: Medicare PPO | Attending: Family Medicine | Admitting: Family Medicine

## 2015-03-15 ENCOUNTER — Encounter (HOSPITAL_COMMUNITY): Payer: Self-pay | Admitting: Radiology

## 2015-03-15 ENCOUNTER — Emergency Department (HOSPITAL_COMMUNITY): Payer: Medicare PPO

## 2015-03-15 DIAGNOSIS — K219 Gastro-esophageal reflux disease without esophagitis: Secondary | ICD-10-CM | POA: Diagnosis present

## 2015-03-15 DIAGNOSIS — D631 Anemia in chronic kidney disease: Secondary | ICD-10-CM | POA: Diagnosis present

## 2015-03-15 DIAGNOSIS — Z4659 Encounter for fitting and adjustment of other gastrointestinal appliance and device: Secondary | ICD-10-CM | POA: Diagnosis not present

## 2015-03-15 DIAGNOSIS — I120 Hypertensive chronic kidney disease with stage 5 chronic kidney disease or end stage renal disease: Secondary | ICD-10-CM | POA: Diagnosis present

## 2015-03-15 DIAGNOSIS — M1990 Unspecified osteoarthritis, unspecified site: Secondary | ICD-10-CM | POA: Diagnosis present

## 2015-03-15 DIAGNOSIS — F1020 Alcohol dependence, uncomplicated: Secondary | ICD-10-CM | POA: Diagnosis present

## 2015-03-15 DIAGNOSIS — Z87891 Personal history of nicotine dependence: Secondary | ICD-10-CM | POA: Diagnosis not present

## 2015-03-15 DIAGNOSIS — K257 Chronic gastric ulcer without hemorrhage or perforation: Secondary | ICD-10-CM | POA: Diagnosis not present

## 2015-03-15 DIAGNOSIS — I10 Essential (primary) hypertension: Secondary | ICD-10-CM | POA: Insufficient documentation

## 2015-03-15 DIAGNOSIS — K862 Cyst of pancreas: Secondary | ICD-10-CM | POA: Diagnosis present

## 2015-03-15 DIAGNOSIS — N179 Acute kidney failure, unspecified: Secondary | ICD-10-CM | POA: Diagnosis present

## 2015-03-15 DIAGNOSIS — F0390 Unspecified dementia without behavioral disturbance: Secondary | ICD-10-CM | POA: Diagnosis present

## 2015-03-15 DIAGNOSIS — K5660 Unspecified intestinal obstruction: Principal | ICD-10-CM | POA: Diagnosis present

## 2015-03-15 DIAGNOSIS — Z87442 Personal history of urinary calculi: Secondary | ICD-10-CM | POA: Diagnosis not present

## 2015-03-15 DIAGNOSIS — R319 Hematuria, unspecified: Secondary | ICD-10-CM | POA: Diagnosis present

## 2015-03-15 DIAGNOSIS — Z6831 Body mass index (BMI) 31.0-31.9, adult: Secondary | ICD-10-CM

## 2015-03-15 DIAGNOSIS — N184 Chronic kidney disease, stage 4 (severe): Secondary | ICD-10-CM | POA: Insufficient documentation

## 2015-03-15 DIAGNOSIS — Z90710 Acquired absence of both cervix and uterus: Secondary | ICD-10-CM | POA: Diagnosis not present

## 2015-03-15 DIAGNOSIS — N185 Chronic kidney disease, stage 5: Secondary | ICD-10-CM | POA: Diagnosis present

## 2015-03-15 DIAGNOSIS — K922 Gastrointestinal hemorrhage, unspecified: Secondary | ICD-10-CM | POA: Diagnosis present

## 2015-03-15 DIAGNOSIS — F329 Major depressive disorder, single episode, unspecified: Secondary | ICD-10-CM | POA: Diagnosis present

## 2015-03-15 DIAGNOSIS — Z9851 Tubal ligation status: Secondary | ICD-10-CM | POA: Diagnosis not present

## 2015-03-15 DIAGNOSIS — E669 Obesity, unspecified: Secondary | ICD-10-CM | POA: Diagnosis present

## 2015-03-15 DIAGNOSIS — Z886 Allergy status to analgesic agent status: Secondary | ICD-10-CM

## 2015-03-15 DIAGNOSIS — R109 Unspecified abdominal pain: Secondary | ICD-10-CM | POA: Diagnosis present

## 2015-03-15 DIAGNOSIS — K56609 Unspecified intestinal obstruction, unspecified as to partial versus complete obstruction: Secondary | ICD-10-CM | POA: Diagnosis present

## 2015-03-15 DIAGNOSIS — I480 Paroxysmal atrial fibrillation: Secondary | ICD-10-CM | POA: Diagnosis present

## 2015-03-15 DIAGNOSIS — Z8711 Personal history of peptic ulcer disease: Secondary | ICD-10-CM

## 2015-03-15 DIAGNOSIS — K5669 Other intestinal obstruction: Secondary | ICD-10-CM | POA: Diagnosis not present

## 2015-03-15 HISTORY — DX: Gastric ulcer, unspecified as acute or chronic, without hemorrhage or perforation: K25.9

## 2015-03-15 HISTORY — DX: Cyst of pancreas: K86.2

## 2015-03-15 LAB — URINALYSIS, ROUTINE W REFLEX MICROSCOPIC
Bilirubin Urine: NEGATIVE
GLUCOSE, UA: NEGATIVE mg/dL
KETONES UR: NEGATIVE mg/dL
Nitrite: NEGATIVE
PH: 6 (ref 5.0–8.0)
Protein, ur: 100 mg/dL — AB
Specific Gravity, Urine: 1.014 (ref 1.005–1.030)
Urobilinogen, UA: 0.2 mg/dL (ref 0.0–1.0)

## 2015-03-15 LAB — POC OCCULT BLOOD, ED: FECAL OCCULT BLD: POSITIVE — AB

## 2015-03-15 LAB — URINE MICROSCOPIC-ADD ON

## 2015-03-15 LAB — CBC WITH DIFFERENTIAL/PLATELET
BASOS PCT: 0 % (ref 0–1)
Basophils Absolute: 0 10*3/uL (ref 0.0–0.1)
EOS PCT: 0 % (ref 0–5)
Eosinophils Absolute: 0 10*3/uL (ref 0.0–0.7)
HCT: 38.9 % (ref 36.0–46.0)
HEMOGLOBIN: 12.9 g/dL (ref 12.0–15.0)
Lymphocytes Relative: 13 % (ref 12–46)
Lymphs Abs: 1.7 10*3/uL (ref 0.7–4.0)
MCH: 28.5 pg (ref 26.0–34.0)
MCHC: 33.2 g/dL (ref 30.0–36.0)
MCV: 86.1 fL (ref 78.0–100.0)
MONOS PCT: 4 % (ref 3–12)
Monocytes Absolute: 0.6 10*3/uL (ref 0.1–1.0)
Neutro Abs: 10.6 10*3/uL — ABNORMAL HIGH (ref 1.7–7.7)
Neutrophils Relative %: 83 % — ABNORMAL HIGH (ref 43–77)
PLATELETS: 312 10*3/uL (ref 150–400)
RBC: 4.52 MIL/uL (ref 3.87–5.11)
RDW: 14.3 % (ref 11.5–15.5)
WBC: 12.9 10*3/uL — ABNORMAL HIGH (ref 4.0–10.5)

## 2015-03-15 LAB — COMPREHENSIVE METABOLIC PANEL
ALBUMIN: 4.4 g/dL (ref 3.5–5.2)
ALT: 13 U/L (ref 0–35)
ANION GAP: 16 — AB (ref 5–15)
AST: 25 U/L (ref 0–37)
Alkaline Phosphatase: 81 U/L (ref 39–117)
BUN: 56 mg/dL — ABNORMAL HIGH (ref 6–23)
CALCIUM: 9.8 mg/dL (ref 8.4–10.5)
CO2: 24 mmol/L (ref 19–32)
CREATININE: 5.19 mg/dL — AB (ref 0.50–1.10)
Chloride: 102 mmol/L (ref 96–112)
GFR calc Af Amer: 8 mL/min — ABNORMAL LOW (ref >90)
GFR, EST NON AFRICAN AMERICAN: 7 mL/min — AB (ref >90)
Glucose, Bld: 148 mg/dL — ABNORMAL HIGH (ref 70–99)
Potassium: 3.7 mmol/L (ref 3.5–5.1)
Sodium: 142 mmol/L (ref 135–145)
Total Bilirubin: 0.5 mg/dL (ref 0.3–1.2)
Total Protein: 8.7 g/dL — ABNORMAL HIGH (ref 6.0–8.3)

## 2015-03-15 LAB — ETHANOL: Alcohol, Ethyl (B): 5 mg/dL (ref 0–9)

## 2015-03-15 LAB — LIPASE, BLOOD: Lipase: 79 U/L — ABNORMAL HIGH (ref 11–59)

## 2015-03-15 LAB — LACTIC ACID, PLASMA: Lactic Acid, Venous: 1.7 mmol/L (ref 0.5–2.0)

## 2015-03-15 MED ORDER — SODIUM CHLORIDE 0.9 % IJ SOLN
3.0000 mL | Freq: Two times a day (BID) | INTRAMUSCULAR | Status: DC
  Administered 2015-03-16 – 2015-03-21 (×9): 3 mL via INTRAVENOUS

## 2015-03-15 MED ORDER — ONDANSETRON HCL 4 MG/2ML IJ SOLN
4.0000 mg | Freq: Four times a day (QID) | INTRAMUSCULAR | Status: DC | PRN
  Administered 2015-03-15 – 2015-03-16 (×2): 4 mg via INTRAVENOUS
  Filled 2015-03-15 (×2): qty 2

## 2015-03-15 MED ORDER — IOHEXOL 300 MG/ML  SOLN
25.0000 mL | INTRAMUSCULAR | Status: AC
  Administered 2015-03-15: 25 mL via ORAL

## 2015-03-15 MED ORDER — SODIUM CHLORIDE 0.9 % IV SOLN
80.0000 mg | INTRAVENOUS | Status: DC
  Filled 2015-03-15: qty 80

## 2015-03-15 MED ORDER — SODIUM CHLORIDE 0.9 % IV BOLUS (SEPSIS)
500.0000 mL | Freq: Once | INTRAVENOUS | Status: AC
  Administered 2015-03-15: 500 mL via INTRAVENOUS

## 2015-03-15 MED ORDER — MORPHINE SULFATE 4 MG/ML IJ SOLN
4.0000 mg | Freq: Once | INTRAMUSCULAR | Status: AC
Start: 2015-03-15 — End: 2015-03-15
  Administered 2015-03-15: 4 mg via INTRAVENOUS
  Filled 2015-03-15: qty 1

## 2015-03-15 MED ORDER — LABETALOL HCL 5 MG/ML IV SOLN
10.0000 mg | INTRAVENOUS | Status: DC | PRN
  Filled 2015-03-15: qty 4

## 2015-03-15 MED ORDER — SODIUM CHLORIDE 0.9 % IV SOLN
INTRAVENOUS | Status: DC
  Administered 2015-03-15 – 2015-03-16 (×2): via INTRAVENOUS

## 2015-03-15 MED ORDER — SODIUM CHLORIDE 0.9 % IV BOLUS (SEPSIS): 1000.0000 mL | Freq: Once | INTRAVENOUS | Status: DC

## 2015-03-15 MED ORDER — ONDANSETRON HCL 4 MG/2ML IJ SOLN
4.0000 mg | Freq: Once | INTRAMUSCULAR | Status: AC
  Administered 2015-03-15: 4 mg via INTRAVENOUS
  Filled 2015-03-15: qty 2

## 2015-03-15 MED ORDER — PANTOPRAZOLE SODIUM 40 MG IV SOLR
40.0000 mg | INTRAVENOUS | Status: DC
  Filled 2015-03-15: qty 40

## 2015-03-15 NOTE — ED Provider Notes (Signed)
CSN: [id number]     Arrival date & time 03/15/15  1055 History   First MD Initiated Contact with Patient 03/15/15 1101     Chief Complaint  Patient presents with  . Abdominal Pain     (Consider location/radiation/quality/duration/timing/severity/associated sxs/prior Treatment) HPI  This is an 81 year old female with history of hypertension, renal insufficiency, GERD, atrial flutter who presents with abdominal pain. Patient reports onset of pain for 2 days. She reports diffuse abdominal pain that is "hurting all over." She reports nausea and multiple episodes of nonbilious, nonbloody emesis. Last bowel movement was yesterday. She reports that was dark. No noted blood in her stool. Reports pain is 6 out of 10. Per nursing notes, recently came off her Xanax.  Past Medical History  Diagnosis Date  . Hypertension   . Renal insufficiency   . ETOHism   . GERD (gastroesophageal reflux disease)   . Arthritis   . Atrial flutter   . Chronic renal failure   . Hemorrhoids   . S/P appy   . S/P tubal ligation   . Dementia    Past Surgical History  Procedure Laterality Date  . Esophagogastroduodenoscopy N/A 12/29/2013    Procedure: ESOPHAGOGASTRODUODENOSCOPY (EGD);  Surgeon: Hart Carwinora M Brodie, MD;  [facility];  Service: Endoscopy;  Laterality: N/A;   Family History  Problem Relation Age of Onset  . Alcohol abuse Mother    History  Substance Use Topics  . Smoking status: Former Smoker    Quit date: 02/14/1986  . Smokeless tobacco: Never Used  . Alcohol Use: No     Comment: former drinker   OB History    No data available     Review of Systems  Constitutional: Negative for fever and chills.  Respiratory: Negative for cough, chest tightness and shortness of breath.   Cardiovascular: Negative for chest pain.  Gastrointestinal: Positive for nausea, vomiting and abdominal pain. Negative for diarrhea, constipation and blood in stool.  Genitourinary: Negative for dysuria.  Skin:  Negative for wound.  Neurological: Negative for headaches.  Psychiatric/Behavioral: Negative for confusion.  All other systems reviewed and are negative.     Allergies  Aspirin and Codeine  Home Medications   Prior to Admission medications   Medication Sig Start Date End Date Taking? Authorizing Provider  acetaminophen (TYLENOL) 500 MG tablet Take 1,000 mg by mouth every 6 (six) hours as needed for mild pain.   Yes Historical Provider, MD  ALPRAZolam Prudy Feeler(XANAX) 0.5 MG tablet  01/13/14   Historical Provider, MD  amLODipine (NORVASC) 10 MG tablet Take 10 mg by mouth daily.  01/13/14  Yes Historical Provider, MD  ciprofloxacin (CIPRO) 500 MG tablet Take 1 tablet (500 mg total) by mouth 2 (two) times daily. Patient not taking: Reported on 03/15/2015 12/31/13   Clydia LlanoMutaz Elmahi, MD  doxycycline (VIBRA-TABS) 100 MG tablet Take 1 tablet (100 mg total) by mouth 2 (two) times daily. Patient not taking: Reported on 03/15/2015 12/31/13   Clydia LlanoMutaz Elmahi, MD  metoprolol tartrate (LOPRESSOR) 25 MG tablet Take 1 tablet (25 mg total) by mouth 2 (two) times daily. 12/31/13  Yes Clydia LlanoMutaz Elmahi, MD  nitroGLYCERIN (NITROSTAT) 0.4 MG SL tablet Place 0.4 mg under the tongue every 5 (five) minutes as needed for chest pain.   Yes Historical Provider, MD  pantoprazole (PROTONIX) 40 MG tablet Take 1 tablet (40 mg total) by mouth daily at 6 (six) AM. Patient not taking: Reported on 03/15/2015 12/31/13   Clydia LlanoMutaz Elmahi, MD  solifenacin (VESICARE) 5 MG  tablet Take 5 mg by mouth daily.   Yes Historical Provider, MD  Tetrahydrozoline HCl (VISINE OP) Place 2 drops into both eyes daily as needed (dry eyes).   Yes Historical Provider, MD  traMADol (ULTRAM) 50 MG tablet  02/17/14   Historical Provider, MD   BP 127/82 mmHg  Pulse 96  Temp(Src) 98.1 F (36.7 C) (Oral)  Resp 16  Ht  (1.549 m)  Wt 167 lb (75.751 kg)  BMI 31.57 kg/m2  SpO2 95% Physical Exam  Constitutional: She is oriented to person, place, and time. No distress.   Elderly  HENT:  Head: Normocephalic and atraumatic.  Eyes: Pupils are equal, round, and reactive to light.  Cardiovascular: Normal rate, regular rhythm and normal heart sounds.   Pulmonary/Chest: Effort normal. No respiratory distress. She has no wheezes.  Abdominal: Soft. Bowel sounds are normal. There is tenderness. There is no rebound and no guarding.  Diffuse tenderness to palpation, worse tenderness over the left upper and lower quadrants  Neurological: She is alert and oriented to person, place, and time.  Skin: Skin is warm and dry.  Psychiatric: She has a normal mood and affect.  Nursing note and vitals reviewed.   ED Course  Procedures (including critical care time) Labs Review Labs Reviewed  CBC WITH DIFFERENTIAL/PLATELET - Abnormal; Notable for the following:    WBC 12.9 (*)    Neutrophils Relative % 83 (*)    Neutro Abs 10.6 (*)    All other components within normal limits  COMPREHENSIVE METABOLIC PANEL - Abnormal; Notable for the following:    Glucose, Bld 148 (*)    BUN 56 (*)    Creatinine, Ser 5.19 (*)    Total Protein 8.7 (*)    GFR calc non Af Amer 7 (*)    GFR calc Af Amer 8 (*)    Anion gap 16 (*)    All other components within normal limits  LIPASE, BLOOD - Abnormal; Notable for the following:    Lipase 79 (*)    All other components within normal limits  URINALYSIS, ROUTINE W REFLEX MICROSCOPIC - Abnormal; Notable for the following:    APPearance CLOUDY (*)    Hgb urine dipstick MODERATE (*)    Protein, ur 100 (*)    Leukocytes, UA SMALL (*)    All other components within normal limits  URINE MICROSCOPIC-ADD ON - Abnormal; Notable for the following:    Squamous Epithelial / LPF MANY (*)    Bacteria, UA FEW (*)    All other components within normal limits  POC OCCULT BLOOD, ED - Abnormal; Notable for the following:    Fecal Occult Bld POSITIVE (*)    All other components within normal limits  ETHANOL  LACTIC ACID, PLASMA  CBC  BASIC METABOLIC  PANEL    Imaging Review Ct Abdomen Pelvis Wo Contrast  03/15/2015   CLINICAL DATA:  Abdomen pain and vomiting starting last night  EXAM: CT ABDOMEN AND PELVIS WITHOUT CONTRAST  TECHNIQUE: Multidetector CT imaging of the abdomen and pelvis was performed following the standard protocol without IV contrast. Oral contrast is administered.  COMPARISON:  December 28, 2013  FINDINGS: There is calcified granuloma within the liver. The liver is otherwise normal. The spleen, gallbladder, adrenal glands are normal. There is a low-density lesion in the upper pole of the right kidney measuring 1.3 cm unchanged compared to prior CT. There are small nonobstructing stones in both kidneys. No hydronephrosis is noted bilaterally. There is a  2.4 cm cystic lesion at the head the pancreas enlarged compared to prior exam. There is atherosclerosis of the abdominal aorta without aneurysmal dilatation.  Images of the bowel demonstrate dilated small bowel loops starting at the proximal jejunum and involving the jejunum proximal ileum. The mid and distal ileal loops are normal in caliber. There is diverticulosis of the colon without diverticulitis. The appendix is not seen but no inflammation is noted around the cecum.  Fluid-filled bladder is normal. Multiple pelvic phleboliths are unchanged. The uterus is not seen. There is atelectasis of the bilateral lung bases. Degenerative joint changes of the spine are identified.  IMPRESSION: Dilated small bowel loops starting at the proximal jejunal loop involving the jejunum through probably the proximal ileum suspicious for small bowel obstruction.  Probable right renal cyst unchanged.  Bilateral nephrolithiasis.  Low attenuation round lesion and the head the pancreas, slightly enlarged compared to prior exam.   Electronically Signed   By: Sherian Rein M.D.   On: 03/15/2015 16:44     EKG Interpretation   Date/Time:  Thursday March 15 2015 12:31:04 EDT Ventricular Rate:  108 PR Interval:   172 QRS Duration: 102 QT Interval:  347 QTC Calculation: 465 R Axis:   49 Text Interpretation:  Sinus tachycardia Left ventricular hypertrophy  Nonspecific T abnrm, anterolateral leads LVH pattern new Confirmed by  HORTON  MD, COURTNEY ([id number]) on 03/15/2015 12:54:05 PM      MDM   Final diagnoses:  Small bowel obstruction   Patient with abdominal pain and vomiting.  Patient made NPO.  Labwork with leukocytosis.  TTP on exam concerning for SBO vs diverticulitis. CT with SBO and pancreatitc mass. Spoke with Dr. Ezzard Standing, surgery, will medically manage.   Admit to family medicine.  Shon Baton, MD 03/15/15 1919

## 2015-03-15 NOTE — ED Notes (Signed)
Pt still on bedpan, has not been able to urinate yet.

## 2015-03-15 NOTE — H&P (Signed)
Family Medicine Teaching [facility] Admission History and Physical Service Pager: [phone]  Patient name: Monica [name] Medical record number: [id number] Date of birth: 19-Jul-1932 Age: 81 y.o. Gender: female  Primary Care Provider: Billee Cashing, MD Consultants: Surgery  Code Status: FULL per discussion on admission   Chief Complaint: Abdominal pain, found to have SBO  Assessment and Plan: Monica [name] is a 81 y.o. female presenting with abdominal pain, NBNB emesis and was found to have a small bowel obstruction. PMH is significant for GERD, nephrolithiasis, HTN, CKD stage IV, and PAF  Small bowel obstruction: Currently the patient's vital signs are stable. I believe this was caught early as there are no significant electrolyte abnormalities. Risk factors include h/o abdominal surgery. Patient had a flexible sigmoidoscopy/biopsy in 08/2011 which revealed trace melanosis, however nothing concerning for malignancy, however flex sig is not as sensitive as a colonoscopy. No abdominal distension noted on exam.  - Admit to telemetry, attending Dr. McDiarmid - Surgery consulted in the ED, reported to see pt this evening or tomorrow. Appreciate recs - Continue to monitor abdominal pain/exam  - Will hold off on any pain regimen as this could mask bowel strangulation - NPO for now - NS at 110cc/hr  - Low threshold to place a NGT  Hemoccult positive: Vital signs and hemoglobin stable. Patient asymptomatic currently. Given color and that his patient is stable, this is most likely a slow upper GI bleed. Patient with a h/o melena: at that time she had an EGD (12/2013) that revealed a prepyloric ulcer measuring 15-20 mm. An EGD in 02/2014 revealed reactive gastropathy with ulceration. She does have a h/o of alcoholism (1980s), however no EGDs have ever noted varices and no evidence of cirrhosis on CT. CT in 08/2011 revealed diverticular disease in the left colon, patient with mild leukocytosis to 12.9  though no inflammatory changes noted on CT.  - will consult GI in the AM - given the patient has abdominal pain and bloody stools, will check a lactic acid: low threshold for repeat imaging if abdominal exam changes/becomes more concerning for mesenteric ischemia.  - Protonix  IV - Repeat CBC in AM  Hematuria: Patient with moderate hemoglobin on U/A though with 0-2 RBCs on micro. Does not appear to be infected: small LE, neg nitrite, many squams, and few bacteria. CT revealed non-obstructing stones in both kidneys.  - has seen urology and has a h/o kidney stones - continue to monitor UOP/monitor for obstruction  Cyst at the head of the pancreas: Was noted to be 14mm in 2012, however appears to have enlarged to 2.4cm on imaging.  DDx includes neoplasm, inflammatory (less likely), true cysts, mucinous non-neoplasm cysts, lymphoepithelial cycts. Lipase is slightly elevated to 79 (was 67 in 12/2013). Cystic appearance would make it seem less likely to be a neoplasm. - Will defer to surgery, appreciate recs  Hypertension: Currently hypertensive despite taking her meds this AM.  - hold home amlodipine  and metoprolol  BID - labetalol  q2hr PRN BP >160/100 - continue to monitor   CKD, stage IV: Baseline Cr appears to be 3.2-3.3. Elevated to 5.19 on this admission. Last renal U/S revealed moderate increased cortical posterior echogenicity compatible with medical renal disease without hydronephrosis. While BUN:Cr ratio is not 20:1, the patient's poor PO intake is most likely contributing - repeat BMET in AM - NS at 110cc/hr  - will attempt to avoid nephrotoxins; if contrast is required in the future will attempt to load with IVFs  prior.  GERD - continue protonix 40mg  IV qAM   PAF: Currently in NSR - holding home metoprolol 25mg  BID - monitor on telemetry  Arthritis: - hold tramadol 50mg , will monitor for pain and tx appropriately   FEN/GI: NS @ 110cc/hr, NPO, PPI Prophylaxis:  No pharmacologic tx given bleed, SCDs   Disposition: Admit to telemetry, attending Dr. McDiarmid  History of Present Illness: Monica [name] is a 81 y.o. female presenting with abdominal pain that occurred yesterday evening. She states the pain is "all over" and "at the top" associated with NBNB emesis but no nausea (?). Pain is intermittent. She has been having more BMs recently. She started taking One A Day vitamins 2 days ago and since then she's noted that her stools were black. Last BM was last night that was pasty.    Patient has had a hysterectomy, appendectomy, and BTL in the past.  She's been seen by [facility] in the past due to a bleed. Per her report, she's never had an EGD, however we have one on file from 02/2014. She states she's not on blood thinners. She reports having a colonoscopy in the past year and states it was normal. She can't take ASA because it hurts her stomach. No NSAID use. Past h/o alcoholism however no alcohol use since 1987.  In the ED, she was found to be hemodynamically stable with a hemoglobin of 12.9. CT of the abdomen revealed what appeared to be a small bowel obstruction and a round lesion at the head of the pancreas that was slightly enlarged from previous imaging. She received morphine 4mg , Zofran 4mg , and 2 500cc NS boluses.    Review Of Systems: Per HPI with the following additions: Leg cramping  Otherwise 12 point review of systems was performed and was unremarkable.  Patient Active Problem List   Diagnosis Date Noted  . Abscess of buttock 12/29/2013  . HTN (hypertension) 12/29/2013  . PAF (paroxysmal atrial fibrillation) 12/29/2013  . Melena 12/29/2013  . CKD (chronic kidney disease), stage IV 12/29/2013  . Leukocytosis 12/29/2013  . Escherichia coli urinary tract infection 12/29/2013  . Gastric ulcer with hemorrhage 12/29/2013  . Upper GI bleed 12/28/2013   Past Medical History: Past Medical History  Diagnosis Date  . Hypertension   . Renal  insufficiency   . ETOHism   . GERD (gastroesophageal reflux disease)   . Arthritis   . Atrial flutter   . Chronic renal failure   . Hemorrhoids   . S/P appy   . S/P tubal ligation   . Dementia    Past Surgical History: Past Surgical History  Procedure Laterality Date  . Esophagogastroduodenoscopy N/A 12/29/2013    Procedure: ESOPHAGOGASTRODUODENOSCOPY (EGD);  Surgeon: Hart Carwin, MD;  [facility] ENDOSCOPY;  Service: Endoscopy;  Laterality: N/A;   Social History: History  Substance Use Topics  . Smoking status: Former Smoker    Quit date: 02/14/1986  . Smokeless tobacco: Never Used  . Alcohol Use: No     Comment: former drinker   Additional social history: Lives at home alone, doesn't have a nurse   Please also refer to relevant sections of EMR.  Family History: Family History  Problem Relation Age of Onset  . Alcohol abuse Mother    Allergies and Medications: Allergies  Allergen Reactions  . Aspirin Other (See Comments)    "makes my stomach hurt"  . Codeine Other (See Comments)    "i get irritated"   No current facility-administered medications  on file prior to encounter.   Current Outpatient Prescriptions on File Prior to Encounter  Medication Sig Dispense Refill  . traMADol (ULTRAM) 50 MG tablet     . acetaminophen (TYLENOL) 500 MG tablet Take 1,000 mg by mouth every 6 (six) hours as needed for mild pain.    Marland Kitchen ALPRAZolam (XANAX) 0.5 MG tablet     . amLODipine (NORVASC) 10 MG tablet Take 10 mg by mouth daily.     . ciprofloxacin (CIPRO) 500 MG tablet Take 1 tablet (500 mg total) by mouth 2 (two) times daily. (Patient not taking: Reported on 03/15/2015) 6 tablet 3  . doxycycline (VIBRA-TABS) 100 MG tablet Take 1 tablet (100 mg total) by mouth 2 (two) times daily. (Patient not taking: Reported on 03/15/2015) 14 tablet 0  . metoprolol tartrate (LOPRESSOR) 25 MG tablet Take 1 tablet (25 mg total) by mouth 2 (two) times daily. 60 tablet 0  . pantoprazole (PROTONIX) 40  MG tablet Take 1 tablet (40 mg total) by mouth daily at 6 (six) AM. (Patient not taking: Reported on 03/15/2015) 30 tablet 0  . Tetrahydrozoline HCl (VISINE OP) Place 2 drops into both eyes daily as needed (dry eyes).      Objective: BP 178/77 mmHg  Pulse 96  Temp(Src) 98.4 F (36.9 C) (Oral)  Resp 20  Ht  (1.549 m)  Wt 167 lb (75.751 kg)  BMI 31.57 kg/m2  SpO2 96% Exam: General: AAF lying in bed in NAD, very pleasant. HEENT: Atraumatic. Oropharynx clear. MMM.  PERRL. conjunctivae non-injected. Moist nasal turbinates.   Cardiovascular: Slightly tachycardic. Regular rhythm. No m/r/g noted. 1+ DP pulses bilaterally. Trace pitting edema bilaterally.  Respiratory: No increased WOB. Lungs CTAB without wheezing, rhonchi, or crackles. Abdomen: Hypoactive BS. Soft, non-distended. Tenderness most prominent in the epigastric region > LUQ >RUQ. No rebound or guarding. Well-healed vertical surgical scar inferior to umbilicus. Extremities: No deformities noted.  Skin: No rashes noted.   Neuro: Oriented to person, place, time, and situation, however sometimes her timeline is not consistent. Facial movements symmetric, tongue/uvula midline. No gross neurologic deficits.  Labs and Imaging: CBC BMET   Recent Labs Lab 03/15/15 1216  WBC 12.9*  HGB 12.9  HCT 38.9  PLT 312    Recent Labs Lab 03/15/15 1216  NA 142  K 3.7  CL 102  CO2 24  BUN 56*  CREATININE 5.19*  GLUCOSE 148*  CALCIUM 9.8     Urinalysis    Component Value Date/Time   COLORURINE YELLOW 03/15/2015 1510   APPEARANCEUR CLOUDY* 03/15/2015 1510   LABSPEC 1.014 03/15/2015 1510   PHURINE 6.0 03/15/2015 1510   GLUCOSEU NEGATIVE 03/15/2015 1510   HGBUR MODERATE* 03/15/2015 1510   BILIRUBINUR NEGATIVE 03/15/2015 1510   KETONESUR NEGATIVE 03/15/2015 1510   PROTEINUR 100* 03/15/2015 1510   UROBILINOGEN 0.2 03/15/2015 1510   NITRITE NEGATIVE 03/15/2015 1510   LEUKOCYTESUR SMALL* 03/15/2015 1510   FOBT  positive  EKG: Sinus tachycardia, HR 108, LVH. No ST elevation/depression. QTc 465   CT abdomen: Dilated small bowel loops starting at the proximal jejunal loop involving the jejunum through probably the proximal ileum suspicious for small bowel obstruction. Probable right renal cyst unchanged. Bilateral nephrolithiasis. Low attenuation round lesion in the head the pancreas, slightly enlarged compared to prior exam.  Joanna Puff, MD 03/15/2015, 5:16 PM PGY-1, [facility] Family Medicine FPTS Intern pager: [phone], text pages welcome  Upper Level Addendum:  I have seen and evaluated this patient along with Dr.  Dorsey and reviewed the above note, making necessary revisions in red.   Marikay AlarEric Lorelee Mclaurin, MD Family Medicine PGY-3

## 2015-03-15 NOTE — Progress Notes (Signed)
Arrival Method:via stretcher  Mental Status: alert and oriented x 4 Telemetry: box #8, CCMD notified Skin: intact Tubes: N/A IV: LFA, NS @ 110 Pain: 4/10 Family: none present, spoke with pt's daughter Living Situation: home alone Safety Measures: bed in lowest position, call bell and phone in reach, bed alarm on, pt instructed to call if need to get out of bed 6E Orientation: refused video, oriented to staff and unit  Pt in bed resting. Will continue to monitor. Carrie MewJasmine Lelon Ikard, RN

## 2015-03-15 NOTE — Progress Notes (Signed)
Patient c/o abdominal pain and vomiting.MD on call notified.Will continue to monitor. Monica [name], Drinda Buttsharito Joselita, RCharity fundraiser

## 2015-03-15 NOTE — ED Notes (Signed)
Called radiology for pt transfer to CT. 

## 2015-03-15 NOTE — Progress Notes (Signed)
Received report from TroyBrooke.   Carrie MewJasmine Khasir Woodrome, RN

## 2015-03-15 NOTE — ED Notes (Signed)
Pt placed on bedpan

## 2015-03-15 NOTE — ED Notes (Signed)
Attempted report 

## 2015-03-15 NOTE — ED Notes (Signed)
Pt presents with abd pain X 2 day. Pt was seen at at PMD for "feeling bad" and issues with medication adherence. Pt stopped taking her medications with the medication of xanax. Pt reports N/V and LLQ pain.

## 2015-03-15 NOTE — Consult Note (Signed)
Reason for Consult:SBO Referring Physician: Dr. Darene Monica [name]. Monica Monica [name]  Monica Monica [name] is an 81 y.o. female.  HPI: This is Monica [name] that presents with Monica [name] 24-hour history of cramping abdominal pain, nausea, and vomiting. She reports this started last evening. Her last bowel movement was yesterday. She has no previous history of similar abdominal complaints. She has had emesis at least twice since presenting to the emergency department. She is still having some cramping abdominal pain which is mostly in the upper abdomen. There is no hematemesis. She is otherwise without complaints.  Past Medical History  Diagnosis Date  . Hypertension   . Renal insufficiency   . ETOHism   . GERD (gastroesophageal reflux disease)   . Arthritis   . Atrial flutter   . Chronic renal failure   . Hemorrhoids   . S/P appy   . S/P tubal ligation   . Dementia     Past Surgical History  Procedure Laterality Date  . Esophagogastroduodenoscopy N/Monica [name] 12/29/2013    Procedure: ESOPHAGOGASTRODUODENOSCOPY (EGD);  Surgeon: Monica Dragon, MD;  Location: [facility] ENDOSCOPY;  Service: Endoscopy;  Laterality: N/Monica [name];    Family History  Problem Relation Age of Onset  . Alcohol abuse Mother     Social History:  reports that she quit smoking about 29 years ago. She has never used smokeless tobacco. She reports that she does not drink alcohol or use illicit drugs.  Allergies:  Allergies  Allergen Reactions  . Aspirin Other (See Comments)    "makes my stomach hurt"  . Codeine Other (See Comments)    "i get irritated"    Medications: I have reviewed the patient's current medications.  Results for orders placed or performed during the hospital encounter of 03/15/15 (from the past 48 hour(s))  CBC with Differential     Status: Abnormal   Collection Time: 03/15/15 12:16 PM  Result Value Ref Range   WBC 12.9 (H) 4.0 - 10.5 K/uL   RBC 4.52 3.87 - 5.11 MIL/uL   Hemoglobin 12.9 12.0 - 15.0 g/dL   HCT 38.9 36.0 - 46.0 %   MCV 86.1  78.0 - 100.0 fL   MCH 28.5 26.0 - 34.0 pg   MCHC 33.2 30.0 - 36.0 g/dL   RDW 14.3 11.5 - 15.5 %   Platelets 312 150 - 400 K/uL   Neutrophils Relative % 83 (H) 43 - 77 %   Neutro Abs 10.6 (H) 1.7 - 7.7 K/uL   Lymphocytes Relative 13 12 - 46 %   Lymphs Abs 1.7 0.7 - 4.0 K/uL   Monocytes Relative 4 3 - 12 %   Monocytes Absolute 0.6 0.1 - 1.0 K/uL   Eosinophils Relative 0 0 - 5 %   Eosinophils Absolute 0.0 0.0 - 0.7 K/uL   Basophils Relative 0 0 - 1 %   Basophils Absolute 0.0 0.0 - 0.1 K/uL  Comprehensive metabolic panel     Status: Abnormal   Collection Time: 03/15/15 12:16 PM  Result Value Ref Range   Sodium 142 135 - 145 mmol/L   Potassium 3.7 3.5 - 5.1 mmol/L   Chloride 102 96 - 112 mmol/L   CO2 24 19 - 32 mmol/L   Glucose, Bld 148 (H) 70 - 99 mg/dL   BUN 56 (H) 6 - 23 mg/dL   Creatinine, Ser 5.19 (H) 0.50 - 1.10 mg/dL   Calcium 9.8 8.4 - 10.5 mg/dL   Total Protein 8.7 (H) 6.0 - 8.3 g/dL   Albumin 4.4 3.5 -  5.2 g/dL   AST 25 0 - 37 U/L   ALT 13 0 - 35 U/L   Alkaline Phosphatase 81 39 - 117 U/L   Total Bilirubin 0.5 0.3 - 1.2 mg/dL   GFR calc non Af Amer 7 (L) >90 mL/min   GFR calc Af Amer 8 (L) >90 mL/min    Comment: (NOTE) The eGFR has been calculated using the CKD EPI equation. This calculation has not been validated in all clinical situations. eGFR's persistently <90 mL/min signify possible Chronic Kidney Disease.    Anion gap 16 (H) 5 - 15  Lipase, blood     Status: Abnormal   Collection Time: 03/15/15 12:16 PM  Result Value Ref Range   Lipase 79 (H) 11 - 59 U/L  Ethanol     Status: None   Collection Time: 03/15/15 12:17 PM  Result Value Ref Range   Alcohol, Ethyl (B) 5 0 - 9 mg/dL    Comment:        LOWEST DETECTABLE LIMIT FOR SERUM ALCOHOL IS 11 mg/dL FOR MEDICAL PURPOSES ONLY   POC occult blood, ED     Status: Abnormal   Collection Time: 03/15/15  1:28 PM  Result Value Ref Range   Fecal Occult Bld POSITIVE (Monica [name]) NEGATIVE  Urinalysis, Routine w reflex  microscopic     Status: Abnormal   Collection Time: 03/15/15  3:10 PM  Result Value Ref Range   Color, Urine YELLOW YELLOW   APPearance CLOUDY (Monica [name]) CLEAR   Specific Gravity, Urine 1.014 1.005 - 1.030   pH 6.0 5.0 - 8.0   Glucose, UA NEGATIVE NEGATIVE mg/dL   Hgb urine dipstick MODERATE (Monica [name]) NEGATIVE   Bilirubin Urine NEGATIVE NEGATIVE   Ketones, ur NEGATIVE NEGATIVE mg/dL   Protein, ur 100 (Monica [name]) NEGATIVE mg/dL   Urobilinogen, UA 0.2 0.0 - 1.0 mg/dL   Nitrite NEGATIVE NEGATIVE   Leukocytes, UA SMALL (Monica [name]) NEGATIVE  Urine microscopic-add on     Status: Abnormal   Collection Time: 03/15/15  3:10 PM  Result Value Ref Range   Squamous Epithelial / LPF MANY (Monica [name]) RARE   WBC, UA 7-10 <3 WBC/hpf   RBC / HPF 0-2 <3 RBC/hpf   Bacteria, UA FEW (Monica [name]) RARE   Urine-Other AMORPHOUS URATES/PHOSPHATES     Ct Abdomen Pelvis Wo Contrast  03/15/2015   CLINICAL DATA:  Abdomen pain and vomiting starting last night  EXAM: CT ABDOMEN AND PELVIS WITHOUT CONTRAST  TECHNIQUE: Multidetector CT imaging of the abdomen and pelvis was performed following the standard protocol without IV contrast. Oral contrast is administered.  COMPARISON:  December 28, 2013  FINDINGS: There is calcified granuloma within the liver. The liver is otherwise normal. The spleen, gallbladder, adrenal glands are normal. There is Monica [name] low-density lesion in the upper pole of the right kidney measuring 1.3 cm unchanged compared to prior CT. There are small nonobstructing stones in both kidneys. No hydronephrosis is noted bilaterally. There is Monica [name] 2.4 cm cystic lesion at the head the pancreas enlarged compared to prior exam. There is atherosclerosis of the abdominal aorta without aneurysmal dilatation.  Images of the bowel demonstrate dilated small bowel loops starting at the proximal jejunum and involving the jejunum proximal ileum. The mid and distal ileal loops are normal in caliber. There is diverticulosis of the colon without diverticulitis. The appendix is not  seen but no inflammation is noted around the cecum.  Fluid-filled bladder is normal. Multiple pelvic phleboliths are unchanged. The uterus is not seen. There is  atelectasis of the bilateral lung bases. Degenerative joint changes of the spine are identified.  IMPRESSION: Dilated small bowel loops starting at the proximal jejunal loop involving the jejunum through probably the proximal ileum suspicious for small bowel obstruction.  Probable right renal cyst unchanged.  Bilateral nephrolithiasis.  Low attenuation round lesion and the head the pancreas, slightly enlarged compared to prior exam.   Electronically Signed   By: Abelardo Diesel M.D.   On: 03/15/2015 16:44    Review of Systems  Constitutional: Negative for fever and chills.  Respiratory: Negative for cough and shortness of breath.   Cardiovascular: Negative for chest pain.  Gastrointestinal: Positive for nausea, vomiting and abdominal pain.  Genitourinary: Negative for dysuria.  All other systems reviewed and are negative.  Blood pressure 127/82, pulse 96, temperature 98.1 F (36.7 C), temperature source Oral, resp. rate 16, height $RemoveBe'5\' 1"'cvLTLMAsG$  (1.549 m), weight 75.751 kg (167 lb), SpO2 95 %. Physical Exam  Constitutional: She is oriented to person, place, and time. She appears well-developed and well-nourished. She appears distressed.  She is lying on her side having just vomited and appears slightly uncomfortable.  HENT:  Head: Normocephalic and atraumatic.  Right Ear: External ear normal.  Left Ear: External ear normal.  Nose: Nose normal.  Mouth/Throat: Oropharynx is clear and moist.  Eyes: Conjunctivae are normal. Pupils are equal, round, and reactive to light. Right eye exhibits no discharge. Left eye exhibits no discharge. No scleral icterus.  Neck: Normal range of motion. No tracheal deviation present.  Cardiovascular: Normal rate, regular rhythm, normal heart sounds and intact distal pulses.   No murmur heard. Respiratory: Effort  normal and breath sounds normal. No respiratory distress. She has no wheezes.  GI: Soft. She exhibits no distension. There is tenderness. There is guarding.  She does have tenderness with guarding across her upper abdomen. There is Monica [name] well-healed lower midline incision without obvious hernia. The abdomen is not distended and is soft  Musculoskeletal: Normal range of motion. She exhibits no edema or tenderness.  Lymphadenopathy:    She has no cervical adenopathy.  Neurological: She is alert and oriented to person, place, and time.  Skin: Skin is warm and dry. No erythema.  Psychiatric: Her behavior is normal. Judgment normal.    Assessment/Plan: Small bowel obstruction  Given her previous surgery, this is consistent with Monica [name] small bowel obstruction from adhesions. This still, however, may be an ileus from gastroenteritis. I believe she continues to have emesis that she will need Monica [name] nasogastric tube inserted. I would continue bowel rest for now. We will repeat abdominal x-rays in the morning to see if any of the contrast is made in into the large intestines.  Her tenderness is concerning. Regarding the findings on the CAT scan of the pancreas, this is minimally increased. Workup of this can be done as an outpatient. She probably needs an MRI at some point. Tumor markers can also be checked. I do however suspect this is benign. We will follow her closely with you.  Monica Monica [name] 03/15/2015, 8:38 PM

## 2015-03-16 ENCOUNTER — Encounter (HOSPITAL_COMMUNITY): Payer: Self-pay | Admitting: Physician Assistant

## 2015-03-16 ENCOUNTER — Inpatient Hospital Stay (HOSPITAL_COMMUNITY): Payer: Medicare PPO

## 2015-03-16 DIAGNOSIS — K56609 Unspecified intestinal obstruction, unspecified as to partial versus complete obstruction: Secondary | ICD-10-CM | POA: Insufficient documentation

## 2015-03-16 DIAGNOSIS — K257 Chronic gastric ulcer without hemorrhage or perforation: Secondary | ICD-10-CM

## 2015-03-16 DIAGNOSIS — K5669 Other intestinal obstruction: Secondary | ICD-10-CM

## 2015-03-16 LAB — BASIC METABOLIC PANEL
ANION GAP: 10 (ref 5–15)
BUN: 54 mg/dL — AB (ref 6–23)
CALCIUM: 8.7 mg/dL (ref 8.4–10.5)
CHLORIDE: 109 mmol/L (ref 96–112)
CO2: 23 mmol/L (ref 19–32)
Creatinine, Ser: 5.21 mg/dL — ABNORMAL HIGH (ref 0.50–1.10)
GFR calc Af Amer: 8 mL/min — ABNORMAL LOW (ref >90)
GFR calc non Af Amer: 7 mL/min — ABNORMAL LOW (ref >90)
GLUCOSE: 128 mg/dL — AB (ref 70–99)
Potassium: 4 mmol/L (ref 3.5–5.1)
Sodium: 142 mmol/L (ref 135–145)

## 2015-03-16 LAB — CBC
HEMATOCRIT: 33.4 % — AB (ref 36.0–46.0)
HEMOGLOBIN: 10.9 g/dL — AB (ref 12.0–15.0)
MCH: 28.2 pg (ref 26.0–34.0)
MCHC: 32.6 g/dL (ref 30.0–36.0)
MCV: 86.3 fL (ref 78.0–100.0)
Platelets: 316 10*3/uL (ref 150–400)
RBC: 3.87 MIL/uL (ref 3.87–5.11)
RDW: 14.4 % (ref 11.5–15.5)
WBC: 10.5 10*3/uL (ref 4.0–10.5)

## 2015-03-16 LAB — MRSA PCR SCREENING: MRSA by PCR: NEGATIVE

## 2015-03-16 MED ORDER — DEXTROSE-NACL 5-0.45 % IV SOLN
INTRAVENOUS | Status: DC
  Administered 2015-03-16 – 2015-03-17 (×4): via INTRAVENOUS

## 2015-03-16 MED ORDER — PANTOPRAZOLE SODIUM 40 MG IV SOLR
40.0000 mg | INTRAVENOUS | Status: DC
  Administered 2015-03-16 – 2015-03-18 (×3): 40 mg via INTRAVENOUS
  Filled 2015-03-16 (×4): qty 40

## 2015-03-16 NOTE — Progress Notes (Signed)
Patient ID: Monica [name], female   DOB: [birth year], 81 y.o.   MRN: [id number]    Subjective: Pt having some abdominal pain today.  Still throwing up.  Doesn't have NGT yet.  Objective: Vital signs in last 24 hours: Temp:  [98.1 F (36.7 C)-99 F (37.2 C)] 98.2 F (36.8 C) (04/08 1006) Pulse Rate:  [87-113] 87 (04/08 1006) Resp:  [10-26] 18 (04/08 1006) BP: (127-178)/(61-101) 154/61 mmHg (04/08 1006) SpO2:  [91 %-100 %] 100 % (04/08 1006) Weight:  [75.751 kg (167 lb)-77.52 kg (170 lb 14.4 oz)] 77.52 kg (170 lb 14.4 oz) (04/07 2220) Last BM Date: 03/14/15  Intake/Output from previous day: 04/07 0701 - 04/08 0700 In: 1228.3 [I.V.:1228.3] Out: 0  Intake/Output this shift: Total I/O In: 60 [P.O.:60] Out: -   PE: Abd: soft, but distended, tender especially in the upper abdomen, absent BS Heart: regular   Lab Results:   Recent Labs  03/15/15 1216 03/16/15 0402  WBC 12.9* 10.5  HGB 12.9 10.9*  HCT 38.9 33.4*  PLT 312 316   BMET  Recent Labs  03/15/15 1216 03/16/15 0402  NA 142 142  K 3.7 4.0  CL 102 109  CO2 24 23  GLUCOSE 148* 128*  BUN 56* 54*  CREATININE 5.19* 5.21*  CALCIUM 9.8 8.7   PT/INR No results for input(s): LABPROT, INR in the last 72 hours. CMP     Component Value Date/Time   NA 142 03/16/2015 0402   K 4.0 03/16/2015 0402   CL 109 03/16/2015 0402   CO2 23 03/16/2015 0402   GLUCOSE 128* 03/16/2015 0402   BUN 54* 03/16/2015 0402   CREATININE 5.21* 03/16/2015 0402   CALCIUM 8.7 03/16/2015 0402   PROT 8.7* 03/15/2015 1216   ALBUMIN 4.4 03/15/2015 1216   AST 25 03/15/2015 1216   ALT 13 03/15/2015 1216   ALKPHOS 81 03/15/2015 1216   BILITOT 0.5 03/15/2015 1216   GFRNONAA 7* 03/16/2015 0402   GFRAA 8* 03/16/2015 0402   Lipase     Component Value Date/Time   LIPASE 79* 03/15/2015 1216       Studies/Results: Ct Abdomen Pelvis Wo Contrast  03/15/2015   CLINICAL DATA:  Abdomen pain and vomiting starting last night  EXAM: CT ABDOMEN AND  PELVIS WITHOUT CONTRAST  TECHNIQUE: Multidetector CT imaging of the abdomen and pelvis was performed following the standard protocol without IV contrast. Oral contrast is administered.  COMPARISON:  December 28, 2013  FINDINGS: There is calcified granuloma within the liver. The liver is otherwise normal. The spleen, gallbladder, adrenal glands are normal. There is a low-density lesion in the upper pole of the right kidney measuring 1.3 cm unchanged compared to prior CT. There are small nonobstructing stones in both kidneys. No hydronephrosis is noted bilaterally. There is a 2.4 cm cystic lesion at the head the pancreas enlarged compared to prior exam. There is atherosclerosis of the abdominal aorta without aneurysmal dilatation.  Images of the bowel demonstrate dilated small bowel loops starting at the proximal jejunum and involving the jejunum proximal ileum. The mid and distal ileal loops are normal in caliber. There is diverticulosis of the colon without diverticulitis. The appendix is not seen but no inflammation is noted around the cecum.  Fluid-filled bladder is normal. Multiple pelvic phleboliths are unchanged. The uterus is not seen. There is atelectasis of the bilateral lung bases. Degenerative joint changes of the spine are identified.  IMPRESSION: Dilated small bowel loops starting at the proximal jejunal loop involving  the jejunum through probably the proximal ileum suspicious for small bowel obstruction.  Probable right renal cyst unchanged.  Bilateral nephrolithiasis.  Low attenuation round lesion and the head the pancreas, slightly enlarged compared to prior exam.   Electronically Signed   By: Sherian ReinWei-Chen  Lin M.D.   On: 03/15/2015 16:44   Dg Abd 2 Views  03/16/2015   CLINICAL DATA:  Small bowel obstruction.  EXAM: ABDOMEN - 2 VIEW  COMPARISON:  CT abdomen 03/15/2015  FINDINGS: There are abnormal dilated loop of bowel in the left lower abdomen with an air-fluid level most consistent with small-bowel  obstruction. There is no bowel dilatation to suggest obstruction. There is no evidence of pneumoperitoneum, portal venous gas or pneumatosis. There are no pathologic calcifications along the expected course of the ureters.  The osseous structures are unremarkable.  IMPRESSION: Findings consistent with small bowel obstruction.   Electronically Signed   By: Elige KoHetal  Patel   On: 03/16/2015 09:04    Anti-infectives: Anti-infectives    None       Assessment/Plan  1. SBO -repeat films today still show a significant SBO. -patient needs and NGT.  Nurse will try to place it.  If she can't, radiology has been consulted to place -strict NPO, she is at risk for aspiration if she continues to drink water and eat ice without an NGT. -will follow closely.  Unable to do SBO protocol for now as she doesn't have an NGT in place.     LOS: 1 day    OSBORNE,KELLY E 03/16/2015, 10:20 AM Pager: [phone]  Agree with above. NGT has been placed in radiology.  Ovidio Kinavid Jennilee Demarco, MD, [facility] Pager: [phone] Office phone:  [phone]

## 2015-03-16 NOTE — Progress Notes (Signed)
Patient refusing to wear telemetry leads. Will d/c order.  Monica Degreealeb M. Jimmey RalphParker, MD [facility] Resident PGY-1 03/16/2015 6:15 PM

## 2015-03-16 NOTE — Progress Notes (Signed)
Attempted x2 to place NGT F#14 to patient's L nare but met resistance.Per patient,her R nose bleeds.MD on call notified.Order to hold off on NGT placement as patient feels better after vomiting.Will continue to monitor. Seleta Hovland, Wonda Cheng, Therapist, sports

## 2015-03-16 NOTE — Progress Notes (Signed)
Continues to note abdominal pain, however improved since admission. Vomiting has resolved. Exam showed abdominal pain in LLQ, voluntary guarding, no rebound tenderness noted, nondistended. Requests liquids, stating these helped her feel better at home. Will continue NPO status for now. Nursing attempted to place NG tube, however met resistance; due to resolution of vomiting will hold off on placing NG tube for now. Will offer K pad for abdominal pain. Due for another dose of Zofran.  Dr. Gerlean Ren

## 2015-03-16 NOTE — Consult Note (Signed)
[facility] Consult: 9:07 AM 03/16/2015  LOS: 1 day    Referring Provider: Dr Gwendolyn Grant  Primary Care Physician:  Billee Cashing, MD Primary Gastroenterologist:  Dr. Juanda Chance, prior to that Dr Randa Evens and Drs Hung/Mann.     Reason for Consultation:  SBO and FOBT +   HPI: Monica [name] is a 81 y.o. female.  PMH CKD (stage 4 in 2012), Dementia/organic brain syndrome. Depression. Htn.  PAF.  Normal pressure hydrocephalus. SS trait, anemia of CKD.  Nephrolithiasis.  Cyst at head of pancreas.  Right buttock abscess 1/20915, managed with abx.  S/p TAH/BSO 1980.  S/p ex lap with LOA 1980.  Rectal bleeding attributed to hemorrhoids in 2011 (per Dr Loreta Ave, no flex or colonoscopy at that time though at the time pt said she had had previous colonoscopy vs flex sig)  On CT scan in 12/2013 colonic diverticulosis noted. Stable appearance to pancreatic uncinate lesion (dates back to 2011): likely pancreatic cyst.  Hx UGIB due to antral/prepyloric ulcer in 12/2013 (detail below).  Her home med list includes daily Protonix and no ASA etc. .    Admitted from ED yesterday c/o of abdominal pain and non-bloody emesis. Pain across the upper abdomen began on Monday. She had routine visit with Dr. Ronne Binning on Tuesday at which time she did not mention the abdominal pain. That evening she started vomiting which she says looked like coffee, not coffee grounds and not bloody.  She was having about 3 stools daily these were [place]. On observation by medical staff since her admission, the emesis is bilious and not bloody or coffee ground.   Noncontrasted CT scan shows dilated loops SB involving jejunum possibley involving ileum, beginning at prox jejunum.  Low attenuation round lesion at [facility], enlarged c/w 12/2013.  On today's follow up KUB there is  persistent SBO.  Labs show Hgb 10.9 down from 12.9 on arrival.  Her baseline looks to be ~ 10.5 in 2015. MCV is normal. WBCs 12.9. BUN/Creatinine acutely worse @ 50s/5.2.  FOBT + stool. Note the patient says she had a colonoscopy last year and states it was normal. However I have called every GI office in [place] and the latest colonoscopy was done at Dr. Kenna Gilbert office in 2010 and these records are to be requested.   PREVIOUS ENDOSCOPIC AND GI RADIOLOGIC STUDIES: UGI series 2004: GER and small HH  BE 2004: sigmoid and descending tics  Colonoscopy 2010  Dr Loreta Ave Records to be requested after pt signs ROI form.   08/2011 Flex sig:  Dr Randa Evens  INDICATIONS: Diarrhea and thickened rectum and sigmoid on CT. Negative C. diff toxin with positive stool lactoferrin. The patient has been on previous antibiotics. ASSESSMENT: Basically normal flexible sigmoidoscopy and biopsy with no signs of pseudomembranes or gross colitis. Random biopsies: + for "trace melanosis coli".   12/2013  EGD:  INDICATIONS: Melena. Heme positive stool. ENDOSCOPIC IMPRESSION: Benign appearing prepyloric ulcer measuring 15-20 mm, this was biopsied. No evidence of recent bleeding. No gastric outlet obstruction. H Pylori negative.   02/2014 EGD:  INDICATIONS: followup benign gastric ulcer. Status post upper GI bleed 12/29/2013. H. pylori negative gastric ulcer in the prepyloric antrum. ENDOSCOPIC IMPRESSION: 90% healing off of benign pyloric gastric ulcer area no stigmata of recent bleeding. Status post biopsies: path: reactive gastropathy with ulceration.    Past Medical History  Diagnosis Date  . Hypertension   . Renal insufficiency   . ETOHism   . GERD (gastroesophageal reflux disease)   . Arthritis   . Atrial flutter   . Chronic renal failure   . Hemorrhoids   . S/P appy   . S/P tubal ligation   . Dementia     Past Surgical History  Procedure Laterality Date  . Esophagogastroduodenoscopy  N/A 12/29/2013    Procedure: ESOPHAGOGASTRODUODENOSCOPY (EGD);  Surgeon: Hart Carwin, MD;  [facility] ENDOSCOPY;  Service: Endoscopy;  Laterality: N/A;    Prior to Admission medications   Medication Sig Start Date End Date Taking? Authorizing Provider  acetaminophen (TYLENOL) 500 MG tablet Take 1,000 mg by mouth every 6 (six) hours as needed for mild pain.   Yes Historical Provider, MD  ALPRAZolam Prudy Feeler) 0.5 MG tablet  01/13/14   Historical Provider, MD  amLODipine (NORVASC) 10 MG tablet Take 10 mg by mouth daily.  01/13/14  Yes Historical Provider, MD  ciprofloxacin (CIPRO) 500 MG tablet Take 1 tablet (500 mg total) by mouth 2 (two) times daily. Patient not taking: Reported on 03/15/2015 12/31/13   Clydia Llano, MD  doxycycline (VIBRA-TABS) 100 MG tablet Take 1 tablet (100 mg total) by mouth 2 (two) times daily. Patient not taking: Reported on 03/15/2015 12/31/13   Clydia Llano, MD  metoprolol tartrate (LOPRESSOR) 25 MG tablet Take 1 tablet (25 mg total) by mouth 2 (two) times daily. 12/31/13  Yes Clydia Llano, MD  nitroGLYCERIN (NITROSTAT) 0.4 MG SL tablet Place 0.4 mg under the tongue every 5 (five) minutes as needed for chest pain.   Yes Historical Provider, MD  pantoprazole (PROTONIX) 40 MG tablet Take 1 tablet (40 mg total) by mouth daily at 6 (six) AM. Patient not taking: Reported on 03/15/2015 12/31/13   Clydia Llano, MD  solifenacin (VESICARE) 5 MG tablet Take 5 mg by mouth daily.   Yes Historical Provider, MD  Tetrahydrozoline HCl (VISINE OP) Place 2 drops into both eyes daily as needed (dry eyes).   Yes Historical Provider, MD  traMADol Janean Sark) 50 MG tablet  02/17/14   Historical Provider, MD    Scheduled Meds: . pantoprazole (PROTONIX) IV  80 mg Intravenous Q24H  . sodium chloride  3 mL Intravenous Q12H   Infusions: . dextrose 5 % and 0.45% NaCl     PRN Meds: labetalol, ondansetron (ZOFRAN) IV   Allergies as of 03/15/2015 - Review Complete 03/15/2015  Allergen Reaction Noted  .  Aspirin Other (See Comments) 08/22/2011  . Codeine Other (See Comments) 08/22/2011    Family History  Problem Relation Age of Onset  . Alcohol abuse Mother     History   Social History  . Marital Status: Married    Spouse Name: N/A  . Number of Children: N/A  . Years of Education: N/A   Occupational History  . Not on file.   Social History Main Topics  . Smoking status: Former Smoker    Quit date: 02/14/1986  . Smokeless tobacco: Never Used  . Alcohol Use: No     Comment: former drinker  . Drug Use: No  . Sexual Activity: Not on file   Other Topics Concern  .  Not on file   Social History Narrative    REVIEW OF SYSTEMS: Constitutional:  No weight loss. ENT:  No nose bleeds Pulm:  No trouble breathing or productive sputum. CV:  No palpitations, no LE edema.  GU:  No hematuria, no frequency GI:  Prior to this week she wasn't having much in the way of GI troubles. Denies heartburn and dysphagia. Normally has about one Mustin, formed stool daily Heme:  No unusual bruising or excessive bleeding.   Transfusions:  Does not recall previous transfusions. Neuro:  No headaches, no peripheral tingling or numbness Derm:  No itching, no rash or sores.  Endocrine:  No sweats or chills.  No polyuria or dysuria Immunization:  Not queried. Travel:  None beyond local counties in last few months.    PHYSICAL EXAM: Vital signs in last 24 hours: Filed Vitals:   03/16/15 0433  BP: 158/76  Pulse: 99  Temp: 98.9 F (37.2 C)  Resp: 18   Wt Readings from Last 3 Encounters:  03/15/15 170 lb 14.4 oz (77.52 kg)  02/22/14 161 lb (73.029 kg)  02/14/14 161 lb 9.6 oz (73.301 kg)    General: Pleasant, alert, comfortable elderly AAF Head:  No facial swelling or signs of head trauma.  Eyes:  No scleral icterus or conjunctival pallor Ears:  No obvious hearing deficit  Nose:  No congestion or discharge. Mouth:  Oral mucosa is clear. No blood or dark material in the mouth. Neck:  No  JVD. No TMG. No mass Lungs:  No labored breathing or cough. Lungs are clear. Heart: RRR. No MRG. S1/S2 audible. Abdomen:  Bowel sounds scant absent. No succession splash. Nondistended. Tender throughout the upper abdomen and less tenderness in the lower abdomen. Tenderness is not focal. No masses, no organomegaly..   Rectal: Deferred   Musc/Skeltl: No joint contractures, redness or swelling. Extremities:  No CCE.  Neurologic:  Oriented to person place and time as well as her situation. No tremor, no limb weakness. No gross neurologic deficits. Skin:  No rash, no sores. Tattoos:  None Nodes:  No cervical adenopathy.   Psych:  Cooperative, relaxed, pleasant.  Intake/Output from previous day: 04/07 0701 - 04/08 0700 In: 1228.3 [I.V.:1228.3] Out: 0  Intake/Output this shift:    LAB RESULTS:  Recent Labs  03/15/15 1216 03/16/15 0402  WBC 12.9* 10.5  HGB 12.9 10.9*  HCT 38.9 33.4*  PLT 312 316   BMET Lab Results  Component Value Date   NA 142 03/16/2015   NA 142 03/15/2015   NA 141 12/31/2013   K 4.0 03/16/2015   K 3.7 03/15/2015   K 3.9 12/31/2013   CL 109 03/16/2015   CL 102 03/15/2015   CL 107 12/31/2013   CO2 23 03/16/2015   CO2 24 03/15/2015   CO2 19 12/31/2013   GLUCOSE 128* 03/16/2015   GLUCOSE 148* 03/15/2015   GLUCOSE 105* 12/31/2013   BUN 54* 03/16/2015   BUN 56* 03/15/2015   BUN 29* 12/31/2013   CREATININE 5.21* 03/16/2015   CREATININE 5.19* 03/15/2015   CREATININE 3.32* 12/31/2013   CALCIUM 8.7 03/16/2015   CALCIUM 9.8 03/15/2015   CALCIUM 8.8 12/31/2013   LFT  Recent Labs  03/15/15 1216  PROT 8.7*  ALBUMIN 4.4  AST 25  ALT 13  ALKPHOS 81  BILITOT 0.5   PT/INR Lab Results  Component Value Date   INR 0.93 12/28/2013   INR 1.05 08/21/2011   INR 1.74* 07/20/2011   Hepatitis Panel No  results for input(s): HEPBSAG, HCVAB, HEPAIGM, HEPBIGM in the last 72 hours. C-Diff No components found for: CDIFF Lipase     Component Value  Date/Time   LIPASE 79* 03/15/2015 1216    Drugs of Abuse  No results found for: LABOPIA, COCAINSCRNUR, LABBENZ, AMPHETMU, THCU, LABBARB   RADIOLOGY STUDIES: Ct Abdomen Pelvis Wo Contrast  03/15/2015   CLINICAL DATA:  Abdomen pain and vomiting starting last night  EXAM: CT ABDOMEN AND PELVIS WITHOUT CONTRAST  TECHNIQUE: Multidetector CT imaging of the abdomen and pelvis was performed following the standard protocol without IV contrast. Oral contrast is administered.  COMPARISON:  December 28, 2013  FINDINGS: There is calcified granuloma within the liver. The liver is otherwise normal. The spleen, gallbladder, adrenal glands are normal. There is a low-density lesion in the upper pole of the right kidney measuring 1.3 cm unchanged compared to prior CT. There are small nonobstructing stones in both kidneys. No hydronephrosis is noted bilaterally. There is a 2.4 cm cystic lesion at the head the pancreas enlarged compared to prior exam. There is atherosclerosis of the abdominal aorta without aneurysmal dilatation.  Images of the bowel demonstrate dilated small bowel loops starting at the proximal jejunum and involving the jejunum proximal ileum. The mid and distal ileal loops are normal in caliber. There is diverticulosis of the colon without diverticulitis. The appendix is not seen but no inflammation is noted around the cecum.  Fluid-filled bladder is normal. Multiple pelvic phleboliths are unchanged. The uterus is not seen. There is atelectasis of the bilateral lung bases. Degenerative joint changes of the spine are identified.  IMPRESSION: Dilated small bowel loops starting at the proximal jejunal loop involving the jejunum through probably the proximal ileum suspicious for small bowel obstruction.  Probable right renal cyst unchanged.  Bilateral nephrolithiasis.  Low attenuation round lesion and the head the pancreas, slightly enlarged compared to prior exam.   Electronically Signed   By: Sherian ReinWei-Chen  Lin M.D.    On: 03/15/2015 16:44   Dg Abd 2 Views  03/16/2015   CLINICAL DATA:  Small bowel obstruction.  EXAM: ABDOMEN - 2 VIEW  COMPARISON:  CT abdomen 03/15/2015  FINDINGS: There are abnormal dilated loop of bowel in the left lower abdomen with an air-fluid level most consistent with small-bowel obstruction. There is no bowel dilatation to suggest obstruction. There is no evidence of pneumoperitoneum, portal venous gas or pneumatosis. There are no pathologic calcifications along the expected course of the ureters.  The osseous structures are unremarkable.  IMPRESSION: Findings consistent with small bowel obstruction.   Electronically Signed   By: Elige KoHetal  Patel   On: 03/16/2015 09:04      IMPRESSION:   *  SBO.  In pt with hx previous abd/pelvic surgery and ex lap/LOA (1980).  So far NGT not placed but follow up films still display SBO.    *  FOBT +.  No melena, no bloody bowel movements.  Observed emesis was/is bilious. Hx bleeding, benign prepyloric ulcer in 12/2013, well on way to healing/resolution on fup 02/2014 EGD.  Daily Protonix at home. Emesis is NB/non CG.  Not convinced she has any active PPI, certainly no need for PPI gtt.   *  Chronic anemia.  Current, post hydration hgb is in keeping with historic baseline.  Not convinced there is any ABL anemia at present. Per PMH has SS trait and renal disease related anemia.   *  Hypoattenuating lesion of pancreatic uncinate. 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  PLAN:     *  Stop the PPI drip, once daily PPI sufficient (had Non bloody emesis).  Switched him to once daily IV PPI.  *  General surgery is seeing pt now and defer mgt of SBO to them.  Agree that she needs NGT placement.   *  No role for endoscopic/colonscopic studies at this time.   As there are no suspicious changes worrisome for neoplasm on colon, given her age 10 and comorbidities, likely does not need future, post-convalescent, colonoscopy.    Would only pursue EGD if she indeed develops  true hematemesis.   Jennye Moccasin  03/16/2015, 9:07 AM Pager: [phone]  Attending MD note:   I have taken a history, examined the patient, and reviewed the chart. I agree with the Advanced Practitioner's impression and recommendations. Pt is now having NG suction, denies abdominal pain. She has multiple GI issues, the SBO  Needs to be treated first. Second: gastric prepyloric ulcer 90% healed on last EGD 02/2014, will need to be reendoscoped before discharge.to r/o recurrent ulcer in the setting of coffee ground emesis.Heme positive stool likely associated with  The avobe problems. .Will follow with the surgical team.  [facility] Pager # [phone]

## 2015-03-16 NOTE — Progress Notes (Signed)
Family Medicine Teaching Service Daily Progress Note Intern Pager: [phone]  Patient name: Monica [name] Medical record number: [id number] Date of birth: [birth year] Age: 81 y.o. Gender: female  Primary Care Provider: Billee Cashing, MD Consultants:  Surgery, GI Code Status: Full  Pt Overview and Major Events to Date:  4/7: Admitted for NBNB emesis, concern for SBO  Assessment and Plan:  Monica [name] is a 81 y.o. female presenting with abdominal pain, NBNB emesis and was found to have a small bowel obstruction. PMH is significant for GERD, nephrolithiasis, HTN, CKD stage IV, and PAF   Small bowel obstruction:   - Continue to monitor abdominal pain/exam  - Will hold off on any pain regimen as this could mask bowel strangulation - NPO for now - NS at 110cc/hr  - NGT placed today in IR - F/u abd Xray to assess contrast transit through bowel  Hemoccult positive: Vital signs and hemoglobin stable. Patient asymptomatic currently. Given color and that his patient is stable, this is most likely a slow upper GI bleed. Patient with a h/o melena: at that time she had an EGD (12/2013) that revealed a prepyloric ulcer measuring 15-20 mm. An EGD in 02/2014 revealed reactive gastropathy with ulceration. She does have a h/o of alcoholism (1980s), however no EGDs have ever noted varices and no evidence of cirrhosis on CT. CT in 08/2011 revealed diverticular disease in the left colon, patient with mild leukocytosis to 12.9 though no inflammatory changes noted on CT.  - s/p GI consult - lactate1.7 - Protonix  IV - Consider sucralfate when tolerating PO - Hct 33.4<-38, will continue to trend CBCs  Hematuria: Patient with moderate hemoglobin on U/A though with 0-2 RBCs on micro. Does not appear to be infected: small LE, neg nitrite, many squams, and few bacteria. CT revealed non-obstructing stones in both kidneys.  - has seen urology and has a h/o kidney stones - continue to monitor UOP/monitor  for obstruction - consider repeat CBC  Cyst at the head of the pancreas:  - Per surgery unlikely to be malignant and can be worked up as an outpatient - CA 19-9 f/u - Will defer to surgery, appreciate recs  Hypertension: Currently hypertensive despite taking her meds this AM.  - hold home amlodipine  and metoprolol  BID - labetalol  q2hr PRN BP >160/100 - continue to monitor   CKD, stage IV: Baseline Cr appears to be 3.2-3.3. Elevated to 5.19 on this admission. Last renal U/S revealed moderate increased cortical posterior echogenicity compatible with medical renal disease without hydronephrosis. While BUN:Cr ratio is not 20:1, the patient's poor PO intake is most likely contributing -Cr 5.2 this AM, continue to trend BMPs - NS at 110cc/hr  - will attempt to avoid nephrotoxins; if contrast is required in the future will attempt to load with IVFs prior.  GERD - continue protonix  IV qAM   PAF: Currently in NSR - holding home metoprolol  BID - monitor on telemetry  Arthritis: - hold tramadol , will monitor for pain and tx appropriately   FEN/GI: NS @ 110cc/hr, NPO, PPI  Prophylaxis: No pharmacologic tx given bleed, SCDs   Disposition: Admit to telemetry, admission pending clinical stability  Subjective:  Has not had emesis since  Yesterday, but continues to endorse abdominal pain unchaged   Objective: Temp:  [98.1 F (36.7 C)-99 F (37.2 C)] 98.9 F (37.2 C) (04/08 0433) Pulse Rate:  [94-113] 99 (04/08 0433) Resp:  [10-26] 18 (04/08 0433) BP: (127-178)/(61-101)  158/76 mmHg (04/08 0433) SpO2:  [91 %-100 %] 95 % (04/08 0433) Weight:  [167 lb (75.751 kg)-170 lb 14.4 oz (77.52 kg)] 170 lb 14.4 oz (77.52 kg) (04/07 2220) Physical Exam:   General: AAF lying in bed in NAD HEENT:NAD, MMM Cardiovascular: RRR, no murmurs Respiratory: No increased WOB. Lungs CTAB without wheezing, rhonchi, or crackles. Abdomen: Hypoactive BS. Soft, non-distended.  TTP at epigastrum, no rebound mild guarding Extremities: No deformities noted.  Skin: No rashes noted.  Neuro: AO x3. No gross neurologic deficits.   Laboratory:  Recent Labs Lab 03/15/15 1216 03/16/15 0402  WBC 12.9* 10.5  HGB 12.9 10.9*  HCT 38.9 33.4*  PLT 312 316    Recent Labs Lab 03/15/15 1216 03/16/15 0402  NA 142 142  K 3.7 4.0  CL 102 109  CO2 24 23  BUN 56* 54*  CREATININE 5.19* 5.21*  CALCIUM 9.8 8.7  PROT 8.7*  --   BILITOT 0.5  --   ALKPHOS 81  --   ALT 13  --   AST 25  --   GLUCOSE 148* 128*     Monica AidAlyssa A Ressie Slevin, MD 03/16/2015, 8:31 AM PGY-1, [facility] FPTS Intern pager: [phone], text pages welcome

## 2015-03-17 ENCOUNTER — Inpatient Hospital Stay (HOSPITAL_COMMUNITY): Payer: Medicare PPO

## 2015-03-17 DIAGNOSIS — N184 Chronic kidney disease, stage 4 (severe): Secondary | ICD-10-CM

## 2015-03-17 DIAGNOSIS — Z4659 Encounter for fitting and adjustment of other gastrointestinal appliance and device: Secondary | ICD-10-CM | POA: Insufficient documentation

## 2015-03-17 DIAGNOSIS — K862 Cyst of pancreas: Secondary | ICD-10-CM | POA: Insufficient documentation

## 2015-03-17 DIAGNOSIS — R319 Hematuria, unspecified: Secondary | ICD-10-CM

## 2015-03-17 DIAGNOSIS — I10 Essential (primary) hypertension: Secondary | ICD-10-CM | POA: Insufficient documentation

## 2015-03-17 LAB — RENAL FUNCTION PANEL
ALBUMIN: 3.4 g/dL — AB (ref 3.5–5.2)
ANION GAP: 7 (ref 5–15)
BUN: 50 mg/dL — ABNORMAL HIGH (ref 6–23)
CALCIUM: 8.4 mg/dL (ref 8.4–10.5)
CO2: 22 mmol/L (ref 19–32)
CREATININE: 5.05 mg/dL — AB (ref 0.50–1.10)
Chloride: 111 mmol/L (ref 96–112)
GFR calc non Af Amer: 7 mL/min — ABNORMAL LOW (ref >90)
GFR, EST AFRICAN AMERICAN: 8 mL/min — AB (ref >90)
Glucose, Bld: 108 mg/dL — ABNORMAL HIGH (ref 70–99)
Phosphorus: 3.7 mg/dL (ref 2.3–4.6)
Potassium: 3.8 mmol/L (ref 3.5–5.1)
Sodium: 140 mmol/L (ref 135–145)

## 2015-03-17 LAB — CBC
HEMATOCRIT: 31.8 % — AB (ref 36.0–46.0)
Hemoglobin: 10.2 g/dL — ABNORMAL LOW (ref 12.0–15.0)
MCH: 28.3 pg (ref 26.0–34.0)
MCHC: 32.1 g/dL (ref 30.0–36.0)
MCV: 88.1 fL (ref 78.0–100.0)
Platelets: 256 10*3/uL (ref 150–400)
RBC: 3.61 MIL/uL — AB (ref 3.87–5.11)
RDW: 14.4 % (ref 11.5–15.5)
WBC: 9.7 10*3/uL (ref 4.0–10.5)

## 2015-03-17 LAB — CANCER ANTIGEN 19-9: CA 19-9: 24 U/mL (ref 0–35)

## 2015-03-17 MED ORDER — WHITE PETROLATUM GEL
Status: AC
  Filled 2015-03-17: qty 1

## 2015-03-17 NOTE — Progress Notes (Signed)
Patient ID: Monica [name], female   DOB: 22-Nov-1932, 81 y.o.   MRN: [id number]    Subjective: Some chest pain earlier, now resolved. No abd pain, it feels better.  + flatus, no BM  Objective: Vital signs in last 24 hours: Temp:  [99.3 F (37.4 C)-99.8 F (37.7 C)] 99.5 F (37.5 C) (04/09 0921) Pulse Rate:  [79-86] 79 (04/09 0921) Resp:  [16-17] 17 (04/09 0921) BP: (146-155)/(63-75) 155/75 mmHg (04/09 0921) SpO2:  [96 %-100 %] 100 % (04/09 0921) Weight:  [77.701 kg (171 lb 4.8 oz)] 77.701 kg (171 lb 4.8 oz) (04/08 2008) Last BM Date: 03/14/15  Intake/Output from previous day: 04/08 0701 - 04/09 0700 In: 3336 [P.O.:240; I.V.:1896; NG/GT:1200] Out: 0  Intake/Output this shift:    General appearance: alert, cooperative and no distress GI: normal findings: soft, non-tender  Lab Results:   Recent Labs  03/16/15 0402 03/17/15 0436  WBC 10.5 9.7  HGB 10.9* 10.2*  HCT 33.4* 31.8*  PLT 316 256   BMET  Recent Labs  03/16/15 0402 03/17/15 0436  NA 142 140  K 4.0 3.8  CL 109 111  CO2 23 22  GLUCOSE 128* 108*  BUN 54* 50*  CREATININE 5.21* 5.05*  CALCIUM 8.7 8.4     Studies/Results: Ct Abdomen Pelvis Wo Contrast  03/15/2015   CLINICAL DATA:  Abdomen pain and vomiting starting last night  EXAM: CT ABDOMEN AND PELVIS WITHOUT CONTRAST  TECHNIQUE: Multidetector CT imaging of the abdomen and pelvis was performed following the standard protocol without IV contrast. Oral contrast is administered.  COMPARISON:  December 28, 2013  FINDINGS: There is calcified granuloma within the liver. The liver is otherwise normal. The spleen, gallbladder, adrenal glands are normal. There is a low-density lesion in the upper pole of the right kidney measuring 1.3 cm unchanged compared to prior CT. There are small nonobstructing stones in both kidneys. No hydronephrosis is noted bilaterally. There is a 2.4 cm cystic lesion at the head the pancreas enlarged compared to prior exam. There is  atherosclerosis of the abdominal aorta without aneurysmal dilatation.  Images of the bowel demonstrate dilated small bowel loops starting at the proximal jejunum and involving the jejunum proximal ileum. The mid and distal ileal loops are normal in caliber. There is diverticulosis of the colon without diverticulitis. The appendix is not seen but no inflammation is noted around the cecum.  Fluid-filled bladder is normal. Multiple pelvic phleboliths are unchanged. The uterus is not seen. There is atelectasis of the bilateral lung bases. Degenerative joint changes of the spine are identified.  IMPRESSION: Dilated small bowel loops starting at the proximal jejunal loop involving the jejunum through probably the proximal ileum suspicious for small bowel obstruction.  Probable right renal cyst unchanged.  Bilateral nephrolithiasis.  Low attenuation round lesion and the head the pancreas, slightly enlarged compared to prior exam.   Electronically Signed   By: Sherian Rein M.D.   On: 03/15/2015 16:44   Dg Abd 1 View  03/16/2015   CLINICAL DATA:  Small-bowel obstruction  EXAM: ABDOMEN - 1 VIEW  COMPARISON:  None.  FINDINGS: A nasogastric catheter was placed under fluoroscopic guidance. This was placed into the gastric lumen and confirmed fluoroscopically.  Radiation Exposure Index (as provided by the fluoroscopic device): Not available  If the device does not provide the exposure index:  Fluoroscopy Time:  20 seconds  Number of Acquired Images:  1  IMPRESSION: Successful fluoroscopic placement of nasogastric catheter.   Electronically Signed  By: Alcide CleverMark  Lukens M.D.   On: 03/16/2015 11:28   Dg Abd 2 Views  03/17/2015   CLINICAL DATA:  Upper lower epigastric pain  EXAM: ABDOMEN - 2 VIEW  COMPARISON:  CT abdomen 03/15/2015  FINDINGS: NG tube looped within the gastric fundus. Oral contrast from CT 03/15/2015 has progressed to the distal descending colon. No dilated loops of large or small bowel.  IMPRESSION: No evidence of  bowel obstruction with progression of oral contrast to the right colon.   Electronically Signed   By: Genevive BiStewart  Edmunds M.D.   On: 03/17/2015 09:19   Dg Abd 2 Views  03/16/2015   CLINICAL DATA:  Small bowel obstruction.  EXAM: ABDOMEN - 2 VIEW  COMPARISON:  CT abdomen 03/15/2015  FINDINGS: There are abnormal dilated loop of bowel in the left lower abdomen with an air-fluid level most consistent with small-bowel obstruction. There is no bowel dilatation to suggest obstruction. There is no evidence of pneumoperitoneum, portal venous gas or pneumatosis. There are no pathologic calcifications along the expected course of the ureters.  The osseous structures are unremarkable.  IMPRESSION: Findings consistent with small bowel obstruction.   Electronically Signed   By: Elige KoHetal  Patel   On: 03/16/2015 09:04   Dg Basil DessNaso G Tube Plc W/fl-no Rad  03/16/2015   CLINICAL DATA:    NASO G TUBE PLACEMENT WITH FLUORO  Fluoroscopy was utilized by the requesting physician.  No radiographic  interpretation.     Anti-infectives: Anti-infectives    None      Assessment/Plan: SBO, appears to be resolving with normalized X ray. Would continue NG today, repeat x ray in AM    LOS: 2 days    Antonie Borjon T 03/17/2015

## 2015-03-17 NOTE — Progress Notes (Signed)
Progress Note for [facility] [name] [facility]  Subjective: Feeling better with the NG tube.  Objective: Vital signs in last 24 hours: Temp:  [98.2 F (36.8 C)-99.8 F (37.7 C)] 99.8 F (37.7 C) (04/09 0431) Pulse Rate:  [82-87] 82 (04/09 0431) Resp:  [16-18] 16 (04/09 0431) BP: (146-154)/(61-67) 154/63 mmHg (04/09 0431) SpO2:  [96 %-100 %] 96 % (04/09 0431) Weight:  [77.701 kg (171 lb 4.8 oz)] 77.701 kg (171 lb 4.8 oz) (04/08 2008) Last BM Date: 03/14/15  Intake/Output from previous day: 04/08 0701 - 04/09 0700 In: 3336 [P.O.:240; I.V.:1896; NG/GT:1200] Out: 0  Intake/Output this shift:    General appearance: alert and no distress GI: obese, soft, moderately distended, tympanic.  Lab Results:  Recent Labs  03/15/15 1216 03/16/15 0402 03/17/15 0436  WBC 12.9* 10.5 9.7  HGB 12.9 10.9* 10.2*  HCT 38.9 33.4* 31.8*  PLT 312 316 256   BMET  Recent Labs  03/15/15 1216 03/16/15 0402 03/17/15 0436  NA 142 142 140  K 3.7 4.0 3.8  CL 102 109 111  CO2 24 23 22   GLUCOSE 148* 128* 108*  BUN 56* 54* 50*  CREATININE 5.19* 5.21* 5.05*  CALCIUM 9.8 8.7 8.4   LFT  Recent Labs  03/15/15 1216 03/17/15 0436  PROT 8.7*  --   ALBUMIN 4.4 3.4*  AST 25  --   ALT 13  --   ALKPHOS 81  --   BILITOT 0.5  --    PT/INR No results for input(s): LABPROT, INR in the last 72 hours. Hepatitis Panel No results for input(s): HEPBSAG, HCVAB, HEPAIGM, HEPBIGM in the last 72 hours. C-Diff No results for input(s): CDIFFTOX in the last 72 hours. Fecal Lactopherrin No results for input(s): FECLLACTOFRN in the last 72 hours.  Studies/Results: Ct Abdomen Pelvis Wo Contrast  03/15/2015   CLINICAL DATA:  Abdomen pain and vomiting starting last night  EXAM: CT ABDOMEN AND PELVIS WITHOUT CONTRAST  TECHNIQUE: Multidetector CT imaging of the abdomen and pelvis was performed following the standard protocol without IV contrast. Oral contrast is administered.  COMPARISON:  December 28, 2013  FINDINGS: There is  calcified granuloma within the liver. The liver is otherwise normal. The spleen, gallbladder, adrenal glands are normal. There is a low-density lesion in the upper pole of the right kidney measuring 1.3 cm unchanged compared to prior CT. There are small nonobstructing stones in both kidneys. No hydronephrosis is noted bilaterally. There is a 2.4 cm cystic lesion at the head the pancreas enlarged compared to prior exam. There is atherosclerosis of the abdominal aorta without aneurysmal dilatation.  Images of the bowel demonstrate dilated small bowel loops starting at the proximal jejunum and involving the jejunum proximal ileum. The mid and distal ileal loops are normal in caliber. There is diverticulosis of the colon without diverticulitis. The appendix is not seen but no inflammation is noted around the cecum.  Fluid-filled bladder is normal. Multiple pelvic phleboliths are unchanged. The uterus is not seen. There is atelectasis of the bilateral lung bases. Degenerative joint changes of the spine are identified.  IMPRESSION: Dilated small bowel loops starting at the proximal jejunal loop involving the jejunum through probably the proximal ileum suspicious for small bowel obstruction.  Probable right renal cyst unchanged.  Bilateral nephrolithiasis.  Low attenuation round lesion and the head the pancreas, slightly enlarged compared to prior exam.   Electronically Signed   By: Sherian ReinWei-Chen  Lin M.D.   On: 03/15/2015 16:44   Dg Abd 1 View  03/16/2015   CLINICAL DATA:  Small-bowel obstruction  EXAM: ABDOMEN - 1 VIEW  COMPARISON:  None.  FINDINGS: A nasogastric catheter was placed under fluoroscopic guidance. This was placed into the gastric lumen and confirmed fluoroscopically.  Radiation Exposure Index (as provided by the fluoroscopic device): Not available  If the device does not provide the exposure index:  Fluoroscopy Time:  20 seconds  Number of Acquired Images:  1  IMPRESSION: Successful fluoroscopic placement of  nasogastric catheter.   Electronically Signed   By: Alcide Clever M.D.   On: 03/16/2015 11:28   Dg Abd 2 Views  03/16/2015   CLINICAL DATA:  Small bowel obstruction.  EXAM: ABDOMEN - 2 VIEW  COMPARISON:  CT abdomen 03/15/2015  FINDINGS: There are abnormal dilated loop of bowel in the left lower abdomen with an air-fluid level most consistent with small-bowel obstruction. There is no bowel dilatation to suggest obstruction. There is no evidence of pneumoperitoneum, portal venous gas or pneumatosis. There are no pathologic calcifications along the expected course of the ureters.  The osseous structures are unremarkable.  IMPRESSION: Findings consistent with small bowel obstruction.   Electronically Signed   By: Elige Ko   On: 03/16/2015 09:04   Dg Basil Dess Tube Plc W/fl-no Rad  03/16/2015   CLINICAL DATA:    NASO G TUBE PLACEMENT WITH FLUORO  Fluoroscopy was utilized by the requesting physician.  No radiographic  interpretation.     Medications:  Scheduled: . pantoprazole (PROTONIX) IV  40 mg Intravenous Q24H  . sodium chloride  3 mL Intravenous Q12H   Continuous: . dextrose 5 % and 0.45% NaCl 120 mL/hr at 03/17/15 0506    Assessment/Plan: 1) SBO. 2) Coffee-ground emesis.   She is feeling better. No further nausea or vomiting.  HGB is stable.  The NG tube is helping to resolve her SBO.  The ABM X-ray is pending for this AM.  Plan: 1) Continue with NG tube. 2) Follow HGB. 3) Possible EGD with her history of coffee-ground emesis, anemia, and history of PUD.  LOS: 2 days   Monica [name] D 03/17/2015, 8:57 AM

## 2015-03-17 NOTE — Progress Notes (Signed)
Family Medicine Teaching Service Daily Progress Note Intern Pager: [phone]  Patient name: Monica [name] Medical record number: [id number] Date of birth: [birth year] Age: 81 y.o. Gender: female  Primary Care Provider: Billee CashingMCKENZIE, WAYLAND, MD Consultants:  Surgery, GI Code Status: Full  Pt Overview and Major Events to Date:  4/7: Admitted for NBNB emesis, concern for SBO  Assessment and Plan:  Monica [name] is a 81 y.o. female presenting with abdominal pain, NBNB emesis and was found to have a small bowel obstruction. PMH is significant for GERD, nephrolithiasis, HTN, CKD stage IV, and PAF   Small bowel obstruction:   - Continue to monitor abdominal pain/exam  - Will hold off on any pain regimen as this could mask bowel strangulation - NPO for now 120 ml overnight - repeat abd xray in AM 4/10 - NS at 110cc/hr  - NGT placed today in IR - F/u abd Xray to assess contrast transit through bowel  Hemoccult positive: Vital signs and hemoglobin stable. Patient asymptomatic currently. Given color and that his patient is stable, this is most likely a slow upper GI bleed. Patient with a h/o melena: at that time she had an EGD (12/2013) that revealed a prepyloric ulcer measuring 15-20 mm. An EGD in 02/2014 revealed reactive gastropathy with ulceration. She does have a h/o of alcoholism (1980s), however no EGDs have ever noted varices and no evidence of cirrhosis on CT. CT in 08/2011 revealed diverticular disease in the left colon, patient with mild leukocytosis to 12.9 though no inflammatory changes noted on CT.  - s/p GI consult - lactate1.7 - Protonix 40mg  IV - Consider sucralfate when tolerating PO - Hct 31.8<-33.4<-38, decrease likely dilutional but will continue to trend CBCs  Hematuria: Patient with moderate hemoglobin on U/A though with 0-2 RBCs on micro. Does not appear to be infected: small LE, neg nitrite, many squams, and few bacteria. CT revealed non-obstructing stones in both kidneys.   -- UA not collected yesterday, will repeat today.  - Hgb stable  Cyst at the head of the pancreas:  - Per surgery unlikely to be malignant and can be worked up as an outpatient - CA 19-9 normal - Will defer to surgery, appreciate recs  Hypertension: Stable - hold home amlodipine 10mg  and metoprolol 25mg  BID - labetalol 10mg  q2hr PRN BP >160/100 - continue to monitor   CKD, stage IV: Baseline Cr appears to be 3.2-3.3. Elevated to 5.19 on this admission. Last renal U/S revealed moderate increased cortical posterior echogenicity compatible with medical renal disease without hydronephrosis. While BUN:Cr ratio is not 20:1, the patient's poor PO intake is most likely contributing -Cr 5.05 <- 5.2 this AM, continue to trend BMPs - NS at 110cc/hr  - will attempt to avoid nephrotoxins; if contrast is required in the future will attempt to load with IVFs prior.  GERD - continue protonix 40mg  IV qAM   PAF: Currently in NSR - holding home metoprolol 25mg  BID - monitor on telemetry  Arthritis: - hold tramadol 50mg , will monitor for pain and tx appropriately   FEN/GI: NS @ 110cc/hr, NPO, PPI  Prophylaxis: No pharmacologic tx given bleed, SCDs   Disposition: pending clinical stability  Subjective:  Improved N/V. Has passed gas with resolved abd pain   Objective: Temp:  [98.2 F (36.8 C)-99.8 F (37.7 C)] 99.8 F (37.7 C) (04/09 0431) Pulse Rate:  [82-87] 82 (04/09 0431) Resp:  [16-18] 16 (04/09 0431) BP: (146-154)/(61-67) 154/63 mmHg (04/09 0431) SpO2:  [96 %-100 %] 96 % (  04/09 0431) Weight:  [171 lb 4.8 oz (77.701 kg)] 171 lb 4.8 oz (77.701 kg) (04/08 2008) Physical Exam:  General: AAF lying in bed in NAD HEENT:NAD, MMM Cardiovascular: RRR, no murmurs Respiratory: No increased WOB. Lungs CTAB without wheezing, rhonchi, or crackles. Abdomen: + BS, mild epigastric tenderness. No rebound or guarding Extremities: No deformities noted.  Skin: No rashes noted.  Neuro: AO  x3. No gross neurologic deficits.  NGT 1200 ml overnight Laboratory:  Recent Labs Lab 03/15/15 1216 03/16/15 0402 03/17/15 0436  WBC 12.9* 10.5 9.7  HGB 12.9 10.9* 10.2*  HCT 38.9 33.4* 31.8*  PLT 312 316 256    Recent Labs Lab 03/15/15 1216 03/16/15 0402 03/17/15 0436  NA 142 142 140  K 3.7 4.0 3.8  CL 102 109 111  CO2 BUN 56* 54* 50*  CREATININE 5.19* 5.21* 5.05*  CALCIUM 9.8 8.7 8.4  PROT 8.7*  --   --   BILITOT 0.5  --   --   ALKPHOS 81  --   --   ALT 13  --   --   AST 25  --   --   GLUCOSE 148* 128* 108*     Bonney Aid, MD 03/17/2015, 8:38 AM PGY-1, [facility] FPTS Intern pager: [phone], text pages welcome

## 2015-03-18 ENCOUNTER — Inpatient Hospital Stay (HOSPITAL_COMMUNITY): Payer: Medicare PPO

## 2015-03-18 DIAGNOSIS — N179 Acute kidney failure, unspecified: Secondary | ICD-10-CM | POA: Insufficient documentation

## 2015-03-18 LAB — CBC
HCT: 29.4 % — ABNORMAL LOW (ref 36.0–46.0)
Hemoglobin: 9.5 g/dL — ABNORMAL LOW (ref 12.0–15.0)
MCH: 28.6 pg (ref 26.0–34.0)
MCHC: 32.3 g/dL (ref 30.0–36.0)
MCV: 88.6 fL (ref 78.0–100.0)
Platelets: 241 10*3/uL (ref 150–400)
RBC: 3.32 MIL/uL — ABNORMAL LOW (ref 3.87–5.11)
RDW: 13.9 % (ref 11.5–15.5)
WBC: 9.1 10*3/uL (ref 4.0–10.5)

## 2015-03-18 MED ORDER — METOPROLOL TARTRATE 25 MG PO TABS
25.0000 mg | ORAL_TABLET | Freq: Two times a day (BID) | ORAL | Status: DC
  Administered 2015-03-18 – 2015-03-21 (×6): 25 mg via ORAL
  Filled 2015-03-18 (×7): qty 1

## 2015-03-18 MED ORDER — TRAZODONE HCL 50 MG PO TABS
50.0000 mg | ORAL_TABLET | Freq: Every day | ORAL | Status: DC
  Administered 2015-03-18 – 2015-03-20 (×3): 50 mg via ORAL
  Filled 2015-03-18 (×4): qty 1

## 2015-03-18 MED ORDER — AMLODIPINE BESYLATE 5 MG PO TABS
5.0000 mg | ORAL_TABLET | Freq: Every day | ORAL | Status: DC
  Administered 2015-03-18 – 2015-03-21 (×4): 5 mg via ORAL
  Filled 2015-03-18 (×4): qty 1

## 2015-03-18 MED ORDER — OLOPATADINE HCL 0.1 % OP SOLN
1.0000 [drp] | Freq: Two times a day (BID) | OPHTHALMIC | Status: DC
  Administered 2015-03-18 – 2015-03-21 (×6): 1 [drp] via OPHTHALMIC
  Filled 2015-03-18 (×2): qty 5

## 2015-03-18 NOTE — Consult Note (Signed)
Renal Service Consult Note [facility]  Monica [name] 03/18/2015 Sol Blazing Requesting Physician:  Dr McDiarmid  Reason for Consult:  CKD patient w SBO HPI: The patient is a 81 y.o. year-old AAF w hx of HTN and CKD presented with abd pain, N/V for 2 days on 4/7.  CT showed SBO and pt was admitted, NG placed. Creat high , +hx of CKD. We are asked to see today as creat not coming down.  BP's up 164/99.  Patient says +flatus, stomach is better, they took the NG out this morning.  Making urine, no voiding problems. She was seen at [facility] 1 time in the past , she doesn't remember when. She did not keep her follow up visits.  Her PCP is Dr Alyson Ingles.  Denies NSAiD's. Lives in [place], has a son and daughter in the area.     Date  Creat  eGFR  CKD 2010-12 1.96 - 2.6 18 - 25  Stage IV Jan 2015 3.1 - 3.3 12 - 13  Stage V 03/15/15  5.19  7  Stage V 03/16/15  5.21 03/17/15  5.05    Past Medical History  Past Medical History  Diagnosis Date  . Hypertension   . CKD (chronic kidney disease) stage 4, GFR 15-29 ml/min   . ETOHism     Quit in the 1980s.  Marland Kitchen GERD (gastroesophageal reflux disease)   . Arthritis   . Atrial flutter   . Chronic renal failure   . Hemorrhoids   . Dementia   . Gastric ulcer 12/2013.    Prepyloric ulcer. On follow-up EGD in 02/2014 this was 90% healed  . Nephrolithiasis   . Pancreatic cyst 09/2010.    At uncinate process.  Serial CT imaging favors benign process   Past Surgical History  Past Surgical History  Procedure Laterality Date  . Esophagogastroduodenoscopy N/A 12/29/2013    Procedure: ESOPHAGOGASTRODUODENOSCOPY (EGD);  Surgeon: Lafayette Dragon, MD;  Location: [facility] ENDOSCOPY;  Service: Endoscopy;  Laterality: N/A;  . Colonoscopy  2010    Per Dr. Collene Mares  . Flexible sigmoidoscopy  08/2011    Per Dr. Oletta Lamas. Normal study. Random biopsies positive for trace melanosis coli.  . Surgical pinning  1996    Foot  . Total abdominal hysterectomy w/ bilateral  salpingoophorectomy  1980  . Exploratory laparotomy with lysis of adhesions  1980   Family History  Family History  Problem Relation Age of Onset  . Alcohol abuse Mother    Social History  reports that she quit smoking about 29 years ago. She has never used smokeless tobacco. She reports that she does not drink alcohol or use illicit drugs. Allergies  Allergies  Allergen Reactions  . Aspirin Other (See Comments)    "makes my stomach hurt"  . Codeine Other (See Comments)    "i get irritated"   Home medications Prior to Admission medications   Medication Sig Start Date End Date Taking? Authorizing Provider  acetaminophen (TYLENOL) 500 MG tablet Take 1,000 mg by mouth every 6 (six) hours as needed for mild pain.   Yes Historical Provider, MD  ALPRAZolam Duanne Moron) 0.5 MG tablet  01/13/14   Historical Provider, MD  amLODipine (NORVASC) 10 MG tablet Take 10 mg by mouth daily.  01/13/14  Yes Historical Provider, MD  ciprofloxacin (CIPRO) 500 MG tablet Take 1 tablet (500 mg total) by mouth 2 (two) times daily. Patient not taking: Reported on 03/15/2015 12/31/13   Verlee Monte, MD  doxycycline (VIBRA-TABS) 100  MG tablet Take 1 tablet (100 mg total) by mouth 2 (two) times daily. Patient not taking: Reported on 03/15/2015 12/31/13   Verlee Monte, MD  metoprolol tartrate (LOPRESSOR) 25 MG tablet Take 1 tablet (25 mg total) by mouth 2 (two) times daily. 12/31/13  Yes Verlee Monte, MD  nitroGLYCERIN (NITROSTAT) 0.4 MG SL tablet Place 0.4 mg under the tongue every 5 (five) minutes as needed for chest pain.   Yes Historical Provider, MD  pantoprazole (PROTONIX) 40 MG tablet Take 1 tablet (40 mg total) by mouth daily at 6 (six) AM. Patient not taking: Reported on 03/15/2015 12/31/13   Verlee Monte, MD  solifenacin (VESICARE) 5 MG tablet Take 5 mg by mouth daily.   Yes Historical Provider, MD  Tetrahydrozoline HCl (VISINE OP) Place 2 drops into both eyes daily as needed (dry eyes).   Yes Historical Provider, MD   traMADol Veatrice Bourbon) 50 MG tablet  02/17/14   Historical Provider, MD   Liver Function Tests  Recent Labs Lab 03/15/15 1216 03/17/15 0436  AST 25  --   ALT 13  --   ALKPHOS 81  --   BILITOT 0.5  --   PROT 8.7*  --   ALBUMIN 4.4 3.4*    Recent Labs Lab 03/15/15 1216  LIPASE 79*   CBC  Recent Labs Lab 03/15/15 1216 03/16/15 0402 03/17/15 0436 03/18/15 0723  WBC 12.9* 10.5 9.7 9.1  NEUTROABS 10.6*  --   --   --   HGB 12.9 10.9* 10.2* 9.5*  HCT 38.9 33.4* 31.8* 29.4*  MCV 86.1 86.3 88.1 88.6  PLT 312 316 256 390   Basic Metabolic Panel  Recent Labs Lab 03/15/15 1216 03/16/15 0402 03/17/15 0436  NA 142 142 140  K 3.7 4.0 3.8  CL 102 109 111  CO2 $Re'24 23 22  'LZT$ GLUCOSE 148* 128* 108*  BUN 56* 54* 50*  CREATININE 5.19* 5.21* 5.05*  CALCIUM 9.8 8.7 8.4  PHOS  --   --  3.7    Filed Vitals:   03/17/15 0921 03/17/15 1647 03/17/15 2020 03/18/15 1007  BP: 155/75 164/66 143/44 160/49  Pulse: 79 76 83 93  Temp: 99.5 F (37.5 C) 98.8 F (37.1 C) 99.7 F (37.6 C) 99 F (37.2 C)  TempSrc: Oral Oral Oral Oral  Resp: $Remo'17 18 16 17  'HsBvQ$ Height:      Weight:   77 kg (169 lb 12.1 oz)   SpO2: 100% 100% 98% 98%   Exam Pleasant , up in chair, no distress, calm , well-kept No rash, cyanosis or gangrene Sclera anicteric, throat clear No JVD or bruits Chest is clear bilat RRR soft SEM, no RG Abd obese, NTND, +BS Trace ankle edema bilat Neuro is nf, alert, Ox 3   Assessment: 1. CKD stage V - no uremic signs or symptoms at this time. Patient has been seen by nephrology OP once or twice but has not followed up.  She is stable now. She is active and functional.  She would be a candidate for dialysis. She says she would want to do dialysis if she had to.  Well get vein mapping, perhaps she can have an AVF placed prior to discharge. Doubt AKI. She has rec'd 6L + fluids so would not give more. She is only mildly vol expanded. 2. N/V w SBO - improving w conservative Rx 3. HTN - on  prn labetalol IV. Resume amlodipine/ MTP.    Plan- as above  Kelly Splinter MD (pgr) [phone]    (  c) [phone] 03/18/2015, 3:00 PM

## 2015-03-18 NOTE — Progress Notes (Signed)
Utilization review completed.  

## 2015-03-18 NOTE — Progress Notes (Signed)
Patient ID: Monica [name], female   DOB: 1932/05/31, 81 y.o.   MRN: [id number]    Subjective: She is hungry. He has had further flatus but no bowel movements. Has some occasional mild midepigastric pain but much improved from admission.  Objective: Vital signs in last 24 hours: Temp:  [98.8 F (37.1 C)-99.7 F (37.6 C)] 99.7 F (37.6 C) (04/09 2020) Pulse Rate:  [76-83] 83 (04/09 2020) Resp:  [16-18] 16 (04/09 2020) BP: (143-164)/(44-66) 143/44 mmHg (04/09 2020) SpO2:  [98 %-100 %] 98 % (04/09 2020) Weight:  [77 kg (169 lb 12.1 oz)] 77 kg (169 lb 12.1 oz) (04/09 2020) Last BM Date: 03/14/15  Intake/Output from previous day: 04/09 0701 - 04/10 0700 In: 1460 [P.O.:360; I.V.:1100] Out: 300 [Emesis/NG output:300] Intake/Output this shift: Total I/O In: 120 [P.O.:120] Out: -   General appearance: alert, cooperative and no distress GI: normal findings: soft, non-tender and nondistended  Lab Results:   Recent Labs  03/17/15 0436 03/18/15 0723  WBC 9.7 9.1  HGB 10.2* 9.5*  HCT 31.8* 29.4*  PLT 256 241   BMET  Recent Labs  03/16/15 0402 03/17/15 0436  NA 142 140  K 4.0 3.8  CL 109 111  CO2 23 22  GLUCOSE 128* 108*  BUN 54* 50*  CREATININE 5.21* 5.05*  CALCIUM 8.7 8.4     Studies/Results: Dg Abd 1 View  03/16/2015   CLINICAL DATA:  Small-bowel obstruction  EXAM: ABDOMEN - 1 VIEW  COMPARISON:  None.  FINDINGS: A nasogastric catheter was placed under fluoroscopic guidance. This was placed into the gastric lumen and confirmed fluoroscopically.  Radiation Exposure Index (as provided by the fluoroscopic device): Not available  If the device does not provide the exposure index:  Fluoroscopy Time:  20 seconds  Number of Acquired Images:  1  IMPRESSION: Successful fluoroscopic placement of nasogastric catheter.   Electronically Signed   By: Alcide CleverMark  Lukens M.D.   On: 03/16/2015 11:28   Dg Abd 2 Views  03/17/2015   CLINICAL DATA:  Upper lower epigastric pain  EXAM: ABDOMEN - 2  VIEW  COMPARISON:  CT abdomen 03/15/2015  FINDINGS: NG tube looped within the gastric fundus. Oral contrast from CT 03/15/2015 has progressed to the distal descending colon. No dilated loops of large or small bowel.  IMPRESSION: No evidence of bowel obstruction with progression of oral contrast to the right colon.   Electronically Signed   By: Genevive BiStewart  Edmunds M.D.   On: 03/17/2015 09:19   Dg Abd Portable 1v  03/18/2015   CLINICAL DATA:  Follow-up small bowel obstruction.  EXAM: PORTABLE ABDOMEN - 1 VIEW  COMPARISON:  03/17/2015  FINDINGS: An NG tube is coiled in the mid stomach.  Oral contrast within the colon is again identified.  The bowel gas pattern is unchanged with a minimally distended loop of small bowel within the left central abdomen.  IMPRESSION: Unchanged bowel gas pattern.   Electronically Signed   By: Harmon PierJeffrey  Hu M.D.   On: 03/18/2015 09:42   Dg Vangie BickerNaso G Tube Plc W/fl-no Rad  03/16/2015   CLINICAL DATA:    NASO G TUBE PLACEMENT WITH FLUORO  Fluoroscopy was utilized by the requesting physician.  No radiographic  interpretation.     Anti-infectives: Anti-infectives    None      Assessment/Plan: Small bowel obstruction, clinically resolved. Discontinue NG tube and start clear liquid diet.    LOS: 3 days    Monica [name] T 03/18/2015

## 2015-03-18 NOTE — Progress Notes (Signed)
Family Medicine Teaching Service Daily Progress Note Intern Pager: [phone]  Patient name: Monica [name] Medical record number: [id number] Date of birth: 07/09/1932 Age: 81 y.o. Gender: female  Primary Care Provider: Billee Cashing, MD Consultants:  Surgery, GI Code Status: Full  Pt Overview and Major Events to Date:  4/7: Admitted for NBNB emesis, concern for SBO 4/10: Improving; Surgery continuing to follow.  Assessment and Plan: Monica [name] is a 81 y.o. female presenting with abdominal pain, NBNB emesis and was found to have a small bowel obstruction. PMH is significant for GERD, nephrolithiasis, HTN, CKD stage IV, and PAF  Small bowel obstruction:   - Continue to monitor abdominal pain/exam  - Awaiting reading on abdominal film this am. - Surgery following.  - IV fluids KVO'd this am (see below).  Hemoccult positive: Patient with a h/o melena: at that time she had an EGD (12/2013) that revealed a prepyloric ulcer measuring 15-20 mm. An EGD in 02/2014 revealed reactive gastropathy with ulceration. She does have a h/o of alcoholism (1980s), however no EGDs have ever noted varices and no evidence of cirrhosis on CT. - GI following. Hb stable.  - Continuing IV protonix.  - Will continue to monitor closely.   Cyst at the head of the pancreas:  - Per surgery unlikely to be malignant and can be worked up as an outpatient  Hypertension: - hold home amlodipine  and metoprolol  BID - labetalol  q2hr PRN BP >160/100 - continue to monitor   AKI on CKD, stage IV: Baseline Cr appears to be 3.2-3.3. Elevated to 5.19 on this admission. Last renal U/S revealed moderate increased cortical posterior echogenicity compatible with medical renal disease without hydronephrosis. - No improvement in Creatinine/GFR. - Creatinine/GFR likely to not improve at this point given this acute event in setting of CKD 4. - Renal consulted today to discuss RRT. - KVO'd fluids pending xray today  and potential advance of diet as well renal recs.   PAF: Currently in NSR - holding home metoprolol  BID - monitor on telemetry  FEN/GI: D5 1/2 NS KVO, NPO, PPI.  Prophylaxis: SCDs.  Disposition: pending clinical improvement.   Subjective:  Feeling well. Eager to eat. Passing flatus. No abdominal pain.  Objective: Temp:  [98.8 F (37.1 C)-99.7 F (37.6 C)] 99.7 F (37.6 C) (04/09 2020) Pulse Rate:  [76-83] 83 (04/09 2020) Resp:  [16-18] 16 (04/09 2020) BP: (143-164)/(44-75) 143/44 mmHg (04/09 2020) SpO2:  [98 %-100 %] 98 % (04/09 2020) Weight:  [169 lb 12.1 oz (77 kg)] 169 lb 12.1 oz (77 kg) (04/09 2020)  Physical Exam: General: well appearing, NAD.  Cardiovascular: RRR, no murmurs Respiratory: CTAB. No rales, rhonchi or wheezing. Abdomen: + BS, soft, nontender, nondistended.  Extremities: No LE edema.   Laboratory:  Recent Labs Lab 03/15/15 1216 03/16/15 0402 03/17/15 0436  WBC 12.9* 10.5 9.7  HGB 12.9 10.9* 10.2*  HCT 38.9 33.4* 31.8*  PLT 312 316 256    Recent Labs Lab 03/15/15 1216 03/16/15 0402 03/17/15 0436  NA 142 142 140  K 3.7 4.0 3.8  CL 102 109 111  CO2 BUN 56* 54* 50*  CREATININE 5.19* 5.21* 5.05*  CALCIUM 9.8 8.7 8.4  PROT 8.7*  --   --   BILITOT 0.5  --   --   ALKPHOS 81  --   --   ALT 13  --   --   AST 25  --   --  GLUCOSE 148* 128* 108*   Imaging: Ct Abdomen Pelvis Wo Contrast  03/15/2015   CLINICAL DATA:  Abdomen pain and vomiting starting last night  EXAM: CT ABDOMEN AND PELVIS WITHOUT CONTRAST  TECHNIQUE: Multidetector CT imaging of the abdomen and pelvis was performed following the standard protocol without IV contrast. Oral contrast is administered.  COMPARISON:  December 28, 2013  FINDINGS: There is calcified granuloma within the liver. The liver is otherwise normal. The spleen, gallbladder, adrenal glands are normal. There is a low-density lesion in the upper pole of the right kidney measuring 1.3 cm unchanged  compared to prior CT. There are small nonobstructing stones in both kidneys. No hydronephrosis is noted bilaterally. There is a 2.4 cm cystic lesion at the head the pancreas enlarged compared to prior exam. There is atherosclerosis of the abdominal aorta without aneurysmal dilatation.  Images of the bowel demonstrate dilated small bowel loops starting at the proximal jejunum and involving the jejunum proximal ileum. The mid and distal ileal loops are normal in caliber. There is diverticulosis of the colon without diverticulitis. The appendix is not seen but no inflammation is noted around the cecum.  Fluid-filled bladder is normal. Multiple pelvic phleboliths are unchanged. The uterus is not seen. There is atelectasis of the bilateral lung bases. Degenerative joint changes of the spine are identified.  IMPRESSION: Dilated small bowel loops starting at the proximal jejunal loop involving the jejunum through probably the proximal ileum suspicious for small bowel obstruction.  Probable right renal cyst unchanged.  Bilateral nephrolithiasis.  Low attenuation round lesion and the head the pancreas, slightly enlarged compared to prior exam.   Electronically Signed   By: Sherian ReinWei-Chen  Lin M.D.   On: 03/15/2015 16:44   Dg Abd 1 View  03/16/2015   CLINICAL DATA:  Small-bowel obstruction  EXAM: ABDOMEN - 1 VIEW  COMPARISON:  None.  FINDINGS: A nasogastric catheter was placed under fluoroscopic guidance. This was placed into the gastric lumen and confirmed fluoroscopically.  Radiation Exposure Index (as provided by the fluoroscopic device): Not available  If the device does not provide the exposure index:  Fluoroscopy Time:  20 seconds  Number of Acquired Images:  1  IMPRESSION: Successful fluoroscopic placement of nasogastric catheter.   Electronically Signed   By: Alcide CleverMark  Lukens M.D.   On: 03/16/2015 11:28   Dg Abd 2 Views  03/17/2015   CLINICAL DATA:  Upper lower epigastric pain  EXAM: ABDOMEN - 2 VIEW  COMPARISON:  CT  abdomen 03/15/2015  FINDINGS: NG tube looped within the gastric fundus. Oral contrast from CT 03/15/2015 has progressed to the distal descending colon. No dilated loops of large or small bowel.  IMPRESSION: No evidence of bowel obstruction with progression of oral contrast to the right colon.   Electronically Signed   By: Genevive BiStewart  Edmunds M.D.   On: 03/17/2015 09:19   Dg Abd 2 Views  03/16/2015   CLINICAL DATA:  Small bowel obstruction.  EXAM: ABDOMEN - 2 VIEW  COMPARISON:  CT abdomen 03/15/2015  FINDINGS: There are abnormal dilated loop of bowel in the left lower abdomen with an air-fluid level most consistent with small-bowel obstruction. There is no bowel dilatation to suggest obstruction. There is no evidence of pneumoperitoneum, portal venous gas or pneumatosis. There are no pathologic calcifications along the expected course of the ureters.  The osseous structures are unremarkable.  IMPRESSION: Findings consistent with small bowel obstruction.   Electronically Signed   By: Elige KoHetal  Patel   On: 03/16/2015  09:04   Dg Basil Dess Tube Plc W/fl-no Rad  03/16/2015   CLINICAL DATA:    NASO G TUBE PLACEMENT WITH FLUORO  Fluoroscopy was utilized by the requesting physician.  No radiographic  interpretation.    Tommie Sams, DO 03/18/2015, 8:20 AM PGY-3, [facility] FPTS Intern pager: [phone], text pages welcome

## 2015-03-18 NOTE — Progress Notes (Signed)
Progress Note for [facility]  Subjective: Feeling better.  Passing flatus.  Objective: Vital signs in last 24 hours: Temp:  [98.8 F (37.1 C)-99.7 F (37.6 C)] 99.7 F (37.6 C) (04/09 2020) Pulse Rate:  [76-83] 83 (04/09 2020) Resp:  [16-18] 16 (04/09 2020) BP: (143-164)/(44-75) 143/44 mmHg (04/09 2020) SpO2:  [98 %-100 %] 98 % (04/09 2020) Weight:  [77 kg (169 lb 12.1 oz)] 77 kg (169 lb 12.1 oz) (04/09 2020) Last BM Date: 03/14/15  Intake/Output from previous day: 04/09 0701 - 04/10 0700 In: 1460 [P.O.:360; I.V.:1100] Out: 300 [Emesis/NG output:300] Intake/Output this shift:    General appearance: alert and no distress GI: soft, + BS, but long in interval, tympanic.  Lab Results:  Recent Labs  03/15/15 1216 03/16/15 0402 03/17/15 0436  WBC 12.9* 10.5 9.7  HGB 12.9 10.9* 10.2*  HCT 38.9 33.4* 31.8*  PLT 312 316 256   BMET  Recent Labs  03/15/15 1216 03/16/15 0402 03/17/15 0436  NA 142 142 140  K 3.7 4.0 3.8  CL 102 109 111  CO2 GLUCOSE 148* 128* 108*  BUN 56* 54* 50*  CREATININE 5.19* 5.21* 5.05*  CALCIUM 9.8 8.7 8.4   LFT  Recent Labs  03/15/15 1216 03/17/15 0436  PROT 8.7*  --   ALBUMIN 4.4 3.4*  AST 25  --   ALT 13  --   ALKPHOS 81  --   BILITOT 0.5  --    PT/INR No results for input(s): LABPROT, INR in the last 72 hours. Hepatitis Panel No results for input(s): HEPBSAG, HCVAB, HEPAIGM, HEPBIGM in the last 72 hours. C-Diff No results for input(s): CDIFFTOX in the last 72 hours. Fecal Lactopherrin No results for input(s): FECLLACTOFRN in the last 72 hours.  Studies/Results: Dg Abd 1 View  03/16/2015   CLINICAL DATA:  Small-bowel obstruction  EXAM: ABDOMEN - 1 VIEW  COMPARISON:  None.  FINDINGS: A nasogastric catheter was placed under fluoroscopic guidance. This was placed into the gastric lumen and confirmed fluoroscopically.  Radiation Exposure Index (as provided by the fluoroscopic device): Not available  If the device does  not provide the exposure index:  Fluoroscopy Time:  20 seconds  Number of Acquired Images:  1  IMPRESSION: Successful fluoroscopic placement of nasogastric catheter.   Electronically Signed   By: Alcide Clever M.D.   On: 03/16/2015 11:28   Dg Abd 2 Views  03/17/2015   CLINICAL DATA:  Upper lower epigastric pain  EXAM: ABDOMEN - 2 VIEW  COMPARISON:  CT abdomen 03/15/2015  FINDINGS: NG tube looped within the gastric fundus. Oral contrast from CT 03/15/2015 has progressed to the distal descending colon. No dilated loops of large or small bowel.  IMPRESSION: No evidence of bowel obstruction with progression of oral contrast to the right colon.   Electronically Signed   By: Genevive Bi M.D.   On: 03/17/2015 09:19   Dg Abd 2 Views  03/16/2015   CLINICAL DATA:  Small bowel obstruction.  EXAM: ABDOMEN - 2 VIEW  COMPARISON:  CT abdomen 03/15/2015  FINDINGS: There are abnormal dilated loop of bowel in the left lower abdomen with an air-fluid level most consistent with small-bowel obstruction. There is no bowel dilatation to suggest obstruction. There is no evidence of pneumoperitoneum, portal venous gas or pneumatosis. There are no pathologic calcifications along the expected course of the ureters.  The osseous structures are unremarkable.  IMPRESSION: Findings consistent with small bowel obstruction.   Electronically Signed  By: Elige KoHetal  Patel   On: 03/16/2015 09:04   Dg Basil DessNaso G Tube Plc W/fl-no Rad  03/16/2015   CLINICAL DATA:    NASO G TUBE PLACEMENT WITH FLUORO  Fluoroscopy was utilized by the requesting physician.  No radiographic  interpretation.     Medications:  Scheduled: . pantoprazole (PROTONIX) IV  40 mg Intravenous Q24H  . sodium chloride  3 mL Intravenous Q12H   Continuous: . dextrose 5 % and 0.45% NaCl 120 mL/hr at 03/18/15 0400    Assessment/Plan: 1) SBO. 2) Anemia. 3) History of PUD.    She appears to be improving from her SBO.  KUB is pending for this AM.  Plan: 1) Continue with  NG tube. 2) Await KUB. 3) EGD per [facility].  They will assume care in the AM.  LOS: 3 days   Christhoper Busbee D 03/18/2015, 8:12 AM

## 2015-03-19 ENCOUNTER — Encounter: Payer: Self-pay | Admitting: Internal Medicine

## 2015-03-19 DIAGNOSIS — N185 Chronic kidney disease, stage 5: Secondary | ICD-10-CM

## 2015-03-19 LAB — RENAL FUNCTION PANEL
Albumin: 3.6 g/dL (ref 3.5–5.2)
Anion gap: 11 (ref 5–15)
BUN: 35 mg/dL — AB (ref 6–23)
CO2: 19 mmol/L (ref 19–32)
CREATININE: 4.84 mg/dL — AB (ref 0.50–1.10)
Calcium: 8.9 mg/dL (ref 8.4–10.5)
Chloride: 108 mmol/L (ref 96–112)
GFR calc Af Amer: 9 mL/min — ABNORMAL LOW (ref >90)
GFR, EST NON AFRICAN AMERICAN: 8 mL/min — AB (ref >90)
Glucose, Bld: 90 mg/dL (ref 70–99)
Phosphorus: 3.2 mg/dL (ref 2.3–4.6)
Potassium: 4.1 mmol/L (ref 3.5–5.1)
Sodium: 138 mmol/L (ref 135–145)

## 2015-03-19 LAB — CBC
HEMATOCRIT: 29.8 % — AB (ref 36.0–46.0)
HEMOGLOBIN: 9.5 g/dL — AB (ref 12.0–15.0)
MCH: 27.8 pg (ref 26.0–34.0)
MCHC: 31.9 g/dL (ref 30.0–36.0)
MCV: 87.1 fL (ref 78.0–100.0)
Platelets: 249 10*3/uL (ref 150–400)
RBC: 3.42 MIL/uL — ABNORMAL LOW (ref 3.87–5.11)
RDW: 13.7 % (ref 11.5–15.5)
WBC: 9.3 10*3/uL (ref 4.0–10.5)

## 2015-03-19 MED ORDER — LORATADINE 10 MG PO TABS
10.0000 mg | ORAL_TABLET | Freq: Every day | ORAL | Status: DC
  Administered 2015-03-19 – 2015-03-21 (×3): 10 mg via ORAL
  Filled 2015-03-19 (×3): qty 1

## 2015-03-19 MED ORDER — ACETAMINOPHEN 325 MG PO TABS
650.0000 mg | ORAL_TABLET | Freq: Four times a day (QID) | ORAL | Status: DC | PRN
  Administered 2015-03-20 – 2015-03-21 (×2): 650 mg via ORAL
  Filled 2015-03-19 (×2): qty 2

## 2015-03-19 MED ORDER — CEFAZOLIN SODIUM 1-5 GM-% IV SOLN
1.0000 g | INTRAVENOUS | Status: AC
  Filled 2015-03-19: qty 50

## 2015-03-19 MED ORDER — PANTOPRAZOLE SODIUM 40 MG PO TBEC
40.0000 mg | DELAYED_RELEASE_TABLET | Freq: Every day | ORAL | Status: DC
  Administered 2015-03-19 – 2015-03-21 (×3): 40 mg via ORAL
  Filled 2015-03-19 (×2): qty 1

## 2015-03-19 NOTE — Consult Note (Signed)
[facility] CONSULT NOTE   MRN : [id number]  Reason for Consult: ESRD Referring Physician: Dr. Powell  History of Present Illness: 81 y/o ESRD not on HD.  We have been asked to consult for permanent access placement.  She was admitted with small bowel obstruction symptoms several days ago which is now resolved.  She is on solid foods today.  She has been seen in the past for CKD and has had progressive decline of function and now is approaching need for hemodialysis.  She has had no prior access procedures.  She is right handed.  Past medical history includes hypertension, A fib,and gastric ulcers.  She takes a daily beta blocker.  She denies DM and hypercholesterolemia.        Current Facility-Administered Medications  Medication Dose Route Frequency Provider Last Rate Last Dose  . amLODipine (NORVASC) tablet 5 mg  5 mg Oral Daily Robert Schertz, MD   5 mg at 03/19/15 1006  . dextrose 5 %-0.45 % sodium chloride infusion   Intravenous Continuous Jayce G Cook, DO 10 mL/hr at 03/18/15 0803    . labetalol (NORMODYNE,TRANDATE) injection 10 mg  10 mg Intravenous Q2H PRN Eric G Sonnenberg, MD      . metoprolol tartrate (LOPRESSOR) tablet 25 mg  25 mg Oral BID Robert Schertz, MD   25 mg at 03/19/15 1007  . olopatadine (PATANOL) 0.1 % ophthalmic solution 1 drop  1 drop Both Eyes BID [place], DO   1 drop at 03/18/15 1833  . ondansetron (ZOFRAN) injection 4 mg  4 mg Intravenous Q6H PRN Crystal S Dorsey, MD   4 mg at 03/16/15 0446  . pantoprazole (PROTONIX) EC tablet 40 mg  40 mg Oral Daily Alyssa A Haney, MD   40 mg at 03/19/15 1006  . sodium chloride 0.9 % injection 3 mL  3 mL Intravenous Q12H Eric G Sonnenberg, MD   3 mL at 03/19/15 1007  . traZODone (DESYREL) tablet 50 mg  50 mg Oral QHS [place] Rumley, DO   50 mg at 03/18/15 2229    Pt meds include: Statin :No Betablocker: Yes ASA: No Other anticoagulants/antiplatelets:   Past Medical History   Diagnosis Date  . Hypertension   . CKD (chronic kidney disease) stage 4, GFR 15-29 ml/min   . ETOHism     Quit in the 1980s.  . GERD (gastroesophageal reflux disease)   . Arthritis   . Atrial flutter   . Chronic renal failure   . Hemorrhoids   . Dementia   . Gastric ulcer 12/2013.    Prepyloric ulcer. On follow-up EGD in 02/2014 this was 90% healed  . Nephrolithiasis   . Pancreatic cyst 09/2010.    At uncinate process.  Serial CT imaging favors benign process    Past Surgical History  Procedure Laterality Date  . Esophagogastroduodenoscopy N/A 12/29/2013    Procedure: ESOPHAGOGASTRODUODENOSCOPY (EGD);  Surgeon: Dora M Brodie, MD;  Location: [facility];  Service: Endoscopy;  Laterality: N/A;  . Colonoscopy  2010    Per Dr. Mann  . Flexible sigmoidoscopy  08/2011    Per Dr. Edwards. Normal study. Random biopsies positive for trace melanosis coli.  . Surgical pinning  1996    Foot  . Total abdominal hysterectomy w/ bilateral salpingoophorectomy  1980  . Exploratory laparotomy with lysis of adhesions  1980    Social History History  Substance Use Topics  . Smoking status: Former Smoker    Quit   date: 02/14/1986  . Smokeless tobacco: Never Used  . Alcohol Use: No     Comment: former drinker    Family History Family History  Problem Relation Age of Onset  . Alcohol abuse Mother     Allergies  Allergen Reactions  . Aspirin Other (See Comments)    "makes my stomach hurt"  . Codeine Other (See Comments)    "i get irritated"     REVIEW OF SYSTEMS  General: [ ] Weight loss, [ ] Fever, [ ] chills Neurologic: [ ] Dizziness, [ ] Blackouts, [ ] Seizure [ ] Stroke, [ ] "Mini stroke", [ ] Slurred speech, [ ] Temporary blindness; [ ] weakness in arms or legs, [ ] Hoarseness [ ] Dysphagia Cardiac: [ ] Chest pain/pressure, [ ] Shortness of breath at rest [ ] Shortness of breath with exertion, [ ] Atrial fibrillation or irregular heartbeat  Vascular: [ ] Pain in legs with  walking, [ ] Pain in legs at rest, [ ] Pain in legs at night,  [ ] Non-healing ulcer, [ ] Blood clot in vein/DVT,   Pulmonary: [ ] Home oxygen, [ ] Productive cough, [ ] Coughing up blood, [ ] Asthma,  [ ] Wheezing [ ] COPD Musculoskeletal:  [ ] Arthritis, [ ] Low back pain, [ ] Joint pain Hematologic: [ ] Easy Bruising, [ ] Anemia; [ ] Hepatitis Gastrointestinal: [ ] Blood in stool, [ ] Gastroesophageal Reflux/heartburn, Urinary: [x ] chronic Kidney disease, [ ] on HD - [ ] MWF or [ ] TTHS, [ ] Burning with urination, [ ] Difficulty urinating Skin: [ ] Rashes, [ ] Wounds Psychological: [ ] Anxiety, [ ] Depression  Physical Examination Filed Vitals:   03/18/15 1007 03/18/15 2146 03/19/15 0421 03/19/15 0900  BP: 160/49 122/80 153/79 145/79  Pulse: 93 71 80 75  Temp: 99 F (37.2 C) 99.2 F (37.3 C) 98.3 F (36.8 C) 99.2 F (37.3 C)  TempSrc: Oral Oral Oral   Resp: 17 16 18 16  Height:      Weight:   177 lb 0.5 oz (80.3 kg)   SpO2: 98% 100% 100% 100%   Body mass index is 33.47 kg/(m^2).  General:  WDWN in NAD Gait: Normal HENT: WNL Eyes: Pupils equal Pulmonary: normal non-labored breathing , without Rales, rhonchi,  wheezing Cardiac: RRR, without  Murmurs, rubs or gallops; No carotid bruits Abdomen: soft, NT, no masses Skin: no rashes, ulcers noted;  no Gangrene , no cellulitis; no open wounds;   Vascular Exam/Pulses:palpable radial and brachial pulses equal bil.   Musculoskeletal: no muscle wasting or atrophy; no edema  Neurologic: A&O X 3; Appropriate Affect ;  SENSATION: normal; MOTOR FUNCTION: 5/5 Symmetric Speech is fluent/normal   Significant Diagnostic Studies: CBC Lab Results  Component Value Date   WBC 9.3 03/19/2015   HGB 9.5* 03/19/2015   HCT 29.8* 03/19/2015   MCV 87.1 03/19/2015   PLT 249 03/19/2015    BMET    Component Value Date/Time   NA 140 03/17/2015 0436   K 3.8 03/17/2015 0436   CL 111 03/17/2015 0436   CO2 22 03/17/2015 0436    GLUCOSE 108* 03/17/2015 0436   BUN 50* 03/17/2015 0436   CREATININE 5.05* 03/17/2015 0436   CALCIUM 8.4 03/17/2015 0436   GFRNONAA 7* 03/17/2015 0436   GFRAA 8* 03/17/2015 0436   Estimated Creatinine Clearance: 8.5 mL/min (by C-G formula based on Cr of 5.05).  COAG Lab Results  Component Value   Date   INR 0.93 12/28/2013   INR 1.05 08/21/2011   INR 1.74* 07/20/2011     Non-Invasive Vascular Imaging: pending vein mapping  ASSESSMENT/PLAN:  ESRD not yet on HD She is right handed we will plan left AV fistula pending vein mapping results. Plan surgery tomorrow with Dr. Dickson.  The patient is in agreement   COLLINS, EMMA MAUREEN 03/19/2015 11:55 AM  Agree with above.  Pt needs permanent access.  Will try to stay in left arm since this is non dominant but will depend on vein mapping.  We will need to remove all IVs from left arm if this is used.  Have called vascular lab to make sure vein map is done today.  Left arm AV access procedure discussed with pt as well as risk benefit possible complications including but not limited to bleeding infection steal non maturation access thrombosis  NPO p midnight Consent.  Evadna Donaghy, MD [facility] Office: [phone] Pager: [phone]  

## 2015-03-19 NOTE — Progress Notes (Signed)
Assessment: 1. CKD stage V - need to prepare for long term ESRD care 2. N/V w SBO - improved 3. HTN - on meds   Plan-I will Ask V[facility] to see today and arrange AV access.  Does not need to stay unless surgery planned for AM  Subjective: Interval History: OK  Objective: Vital signs in last 24 hours: Temp:  [98.3 F (36.8 C)-99.2 F (37.3 C)] 99.2 F (37.3 C) (04/11 0900) Pulse Rate:  [71-80] 75 (04/11 0900) Resp:  [16-18] 16 (04/11 0900) BP: (122-153)/(79-80) 145/79 mmHg (04/11 0900) SpO2:  [100 %] 100 % (04/11 0900) Weight:  [80.3 kg (177 lb 0.5 oz)] 80.3 kg (177 lb 0.5 oz) (04/11 0421) Weight change: 3.3 kg (7 lb 4.4 oz)  Intake/Output from previous day: 04/10 0701 - 04/11 0700 In: 1748 [P.O.:1748] Out: 0  Intake/Output this shift: Total I/O In: 240 [P.O.:240] Out: 1 [Stool:1]  General appearance: alert and cooperative Resp: clear to auscultation bilaterally Chest wall: no tenderness GI: soft, non-tender; bowel sounds normal; no masses,  no organomegaly Extremities: extremities normal, atraumatic, no cyanosis or edema  Lab Results:  Recent Labs  03/18/15 0723 03/19/15 0443  WBC 9.1 9.3  HGB 9.5* 9.5*  HCT 29.4* 29.8*  PLT 241 249   BMET:  Recent Labs  03/17/15 0436  NA 140  K 3.8  CL 111  CO2 22  GLUCOSE 108*  BUN 50*  CREATININE 5.05*  CALCIUM 8.4   No results for input(s): PTH in the last 72 hours. Iron Studies: No results for input(s): IRON, TIBC, TRANSFERRIN, FERRITIN in the last 72 hours. Studies/Results: Dg Abd Portable 1v  03/18/2015   CLINICAL DATA:  Follow-up small bowel obstruction.  EXAM: PORTABLE ABDOMEN - 1 VIEW  COMPARISON:  03/17/2015  FINDINGS: An NG tube is coiled in the mid stomach.  Oral contrast within the colon is again identified.  The bowel gas pattern is unchanged with a minimally distended loop of small bowel within the left central abdomen.  IMPRESSION: Unchanged bowel gas pattern.   Electronically Signed   By: Harmon PierJeffrey  Hu  M.D.   On: 03/18/2015 09:42   Scheduled: . amLODipine  5 mg Oral Daily  . metoprolol tartrate  25 mg Oral BID  . olopatadine  1 drop Both Eyes BID  . pantoprazole  40 mg Oral Daily  . sodium chloride  3 mL Intravenous Q12H  . traZODone  50 mg Oral QHS     LOS: 4 days   Rey Fors C 03/19/2015,11:45 AM

## 2015-03-19 NOTE — Progress Notes (Signed)
          Daily Rounding Note  03/19/2015, 8:39 AM  LOS: 4 days   SUBJECTIVE:       2 BMs yesterday.  No nausea, no abdominal pain.  Feels ok.  No SOB or chest pain.  Urinating alot  OBJECTIVE:         Vital signs in last 24 hours:    Temp:  [98.3 F (36.8 C)-99.2 F (37.3 C)] 98.3 F (36.8 C) (04/11 0421) Pulse Rate:  [71-93] 80 (04/11 0421) Resp:  [16-18] 18 (04/11 0421) BP: (122-160)/(49-80) 153/79 mmHg (04/11 0421) SpO2:  [98 %-100 %] 100 % (04/11 0421) Weight:  [177 lb 0.5 oz (80.3 kg)] 177 lb 0.5 oz (80.3 kg) (04/11 0421) Last BM Date: 03/18/15 Filed Weights   03/16/15 2008 03/17/15 2020 03/19/15 0421  Weight: 171 lb 4.8 oz (77.701 kg) 169 lb 12.1 oz (77 kg) 177 lb 0.5 oz (80.3 kg)   General: looks well, NAD   Heart: RRR Chest: clear bil  Abdomen: soft, active BS, NT, slightly protuberant  Extremities: no CCE Neuro/Psych:  Pleasant, appropriate, moves all 4s.   Intake/Output from previous day: 04/10 0701 - 04/11 0700 In: 1748 [P.O.:1748] Out: 0   Intake/Output this shift:    Lab Results:  Recent Labs  03/17/15 0436 03/18/15 0723 03/19/15 0443  WBC 9.7 9.1 9.3  HGB 10.2* 9.5* 9.5*  HCT 31.8* 29.4* 29.8*  PLT 256 241 249   BMET  Recent Labs  03/17/15 0436  NA 140  K 3.8  CL 111  CO2 22  GLUCOSE 108*  BUN 50*  CREATININE 5.05*  CALCIUM 8.4   LFT  Recent Labs  03/17/15 0436  ALBUMIN 3.4*    Studies/Results: Dg Abd Portable 1v  03/18/2015   CLINICAL DATA:  Follow-up small bowel obstruction.  EXAM: PORTABLE ABDOMEN - 1 VIEW  COMPARISON:  03/17/2015  FINDINGS: An NG tube is coiled in the mid stomach.  Oral contrast within the colon is again identified.  The bowel gas pattern is unchanged with a minimally distended loop of small bowel within the left central abdomen.  IMPRESSION: Unchanged bowel gas pattern.   Electronically Signed   By: Harmon PierJeffrey  Hu M.D.   On: 03/18/2015 09:42    ASSESMENT:    *  SBO.  Improved with NGT (removed 4/9), Xray normalized. .   *  CG emesis?. Hx gastric ulcer 12/2013, 90% healed on 02/2014 EGD.    *  FOBT +.   *  Chronic dz anemia.   *  Stage 4 CKD.    PLAN   *  Advance to carb mod diet given glucose into 140s.  Did not write for renal diet as her potassium level is ok.    *  Not clear she needs EGD now, defer decision to MD.     Jennye MoccasinSarah Gribbin  03/19/2015, 8:39 AM Pager: [phone]     Attending physician's note   I have taken an interval history, reviewed the chart and examined the patient. I agree with the Advanced Practitioner's note, impression and recommendations. VVS plans AV access procedure tomorrow. SBO has resolved, tolerating solid food. History of healing gastric ulcer can be followed as outpatient. Continue PPI daily. GI signing off. Outpatient GI follow up with Dr. Lina Sarora Brodie in about 6 weeks.   Venita LickMalcolm T. Russella DarStark, MD [facility]

## 2015-03-19 NOTE — Progress Notes (Signed)
[facility] Progress Note     Subjective: Pt denies N/V, ambulating some.  C/o mild abdominal bloating, but minimal pain.  Says she feels much better since started having flatus and BM's.    Objective: Vital signs in last 24 hours: Temp:  [98.3 F (36.8 C)-99.2 F (37.3 C)] 98.3 F (36.8 C) (04/11 0421) Pulse Rate:  [71-93] 80 (04/11 0421) Resp:  [16-18] 18 (04/11 0421) BP: (122-160)/(49-80) 153/79 mmHg (04/11 0421) SpO2:  [98 %-100 %] 100 % (04/11 0421) Weight:  [80.3 kg (177 lb 0.5 oz)] 80.3 kg (177 lb 0.5 oz) (04/11 0421) Last BM Date: 03/18/15  Intake/Output from previous day: 04/10 0701 - 04/11 0700 In: 1748 [P.O.:1748] Out: 0  Intake/Output this shift:    PE: Gen:  Alert, NAD, pleasant Abd: Soft, minimal tenderness, mild distension, +BS, no HSM   Lab Results:   Recent Labs  03/18/15 0723 03/19/15 0443  WBC 9.1 9.3  HGB 9.5* 9.5*  HCT 29.4* 29.8*  PLT 241 249   BMET  Recent Labs  03/17/15 0436  NA 140  K 3.8  CL 111  CO2 22  GLUCOSE 108*  BUN 50*  CREATININE 5.05*  CALCIUM 8.4   PT/INR No results for input(s): LABPROT, INR in the last 72 hours. CMP     Component Value Date/Time   NA 140 03/17/2015 0436   K 3.8 03/17/2015 0436   CL 111 03/17/2015 0436   CO2 22 03/17/2015 0436   GLUCOSE 108* 03/17/2015 0436   BUN 50* 03/17/2015 0436   CREATININE 5.05* 03/17/2015 0436   CALCIUM 8.4 03/17/2015 0436   PROT 8.7* 03/15/2015 1216   ALBUMIN 3.4* 03/17/2015 0436   AST 25 03/15/2015 1216   ALT 13 03/15/2015 1216   ALKPHOS 81 03/15/2015 1216   BILITOT 0.5 03/15/2015 1216   GFRNONAA 7* 03/17/2015 0436   GFRAA 8* 03/17/2015 0436   Lipase     Component Value Date/Time   LIPASE 79* 03/15/2015 1216       Studies/Results: Dg Abd Portable 1v  03/18/2015   CLINICAL DATA:  Follow-up small bowel obstruction.  EXAM: PORTABLE ABDOMEN - 1 VIEW  COMPARISON:  03/17/2015  FINDINGS: An NG tube is coiled in the mid stomach.  Oral contrast  within the colon is again identified.  The bowel gas pattern is unchanged with a minimally distended loop of small bowel within the left central abdomen.  IMPRESSION: Unchanged bowel gas pattern.   Electronically Signed   By: Harmon PierJeffrey  Hu M.D.   On: 03/18/2015 09:42    Anti-infectives: Anti-infectives    None       Assessment/Plan SBO -Improved with conservative mgt -Ambulate and IS -SCD's and not currently on pharm dvt proph -NG discontinued 4/10 and started on clears, her diet was already advanced to soft diet by another service -Having BM's and flatus -Having some kidney issues according to primary service, d/c home when tolerating solid diet from our perspective  Emesis FOBT + Chronic dz anemia Stage 5 CKD Cyst of head of pancreas Hypertension PAF   LOS: 4 days    DORT, Solon Alban 03/19/2015, 8:13 AM Pager: [phone]

## 2015-03-19 NOTE — Progress Notes (Signed)
Family Medicine Teaching Service Daily Progress Note Intern Pager: [phone]  Patient name: Monica [name] Medical record number: [id number] Date of birth: [birth year] Age: 81 y.o. Gender: female  Primary Care Provider: Billee Cashing, MD Consultants:  Surgery, GI Code Status: Full  Pt Overview and Major Events to Date:  4/7: Admitted for NBNB emesis, concern for SBO 4/10: Improving; Surgery continuing to follow.  Assessment and Plan: Monica [name] is a 81 y.o. female presenting with abdominal pain, NBNB emesis and was found to have a small bowel obstruction. PMH is significant for GERD, nephrolithiasis, HTN, CKD stage V, and PAF. SBO improving, now with worse renal function  Small bowel obstruction:   - Clinically and radiologically improved diet advanced to carb controlled diet- Surgery following.  - IV fluids KVO'd this am (see below).  Hemoccult positive: Patient with a h/o melena: at that time she had an EGD (12/2013) that revealed a prepyloric ulcer measuring 15-20 mm. An EGD in 02/2014 revealed reactive gastropathy with ulceration. She does have a h/o of alcoholism (1980s), however no EGDs have ever noted varices and no evidence of cirrhosis on CT. - GI following. Hb stable.  - Continuing protonix PO.  - Per GI unclear if EGD needed, consider as outpt - Will continue to monitor closely.   Cyst at the head of the pancreas:  - Per surgery unlikely to be malignant and can be worked up as an outpatient -CA 19-9 nl  Hypertension: - hold home amlodipine  and metoprolol  BID - labetalol  q2hr PRN BP >160/100 - continue to monitor   AKI on CKD, stage V: Baseline Cr appears to be 3.2-3.3. Elevated to 5.19 on this admission. Last renal U/S revealed moderate increased cortical posterior echogenicity compatible with medical renal disease without hydronephrosis. - No improvement in Creatinine/GFR. - Renal consulted with plan to move to HD. Vein mapping today, perhaps AVF prior  to d/c. Pt unsure if she is ready to pursue dialysis. Renal to evaluate furtehr - Renal function panel pending - KVO'd fluids pending xray today and potential advance of diet as well renal recs.  - KVO as had adequate fluid repletion  PAF: Currently in NSR - on home metoprolol  BID - not currently anticoagulated, CHA2DS2VASC 5, consider anticoagulation after AVF placement determination - monitor on telemetry  FEN/GI:  - Carb controlled diet per GI, KVO. PPI  Prophylaxis: SCDs.  Disposition: pending clinical improvement.   Subjective:  Feeling well, + flatus, + BM soft and Monica [name] in color no blood in stool not dark. resolved abdominal pain  Objective: Temp:  [98.3 F (36.8 C)-99.2 F (37.3 C)] 98.3 F (36.8 C) (04/11 0421) Pulse Rate:  [71-93] 80 (04/11 0421) Resp:  [16-18] 18 (04/11 0421) BP: (122-160)/(49-80) 153/79 mmHg (04/11 0421) SpO2:  [98 %-100 %] 100 % (04/11 0421) Weight:  [177 lb 0.5 oz (80.3 kg)] 177 lb 0.5 oz (80.3 kg) (04/11 0421)  Physical Exam: General: well appearing, NAD.  Cardiovascular: RRR, no murmurs Respiratory: CTAB. No rales, rhonchi or wheezing. Abdomen: + BS, soft, nontender, nondistended.  Extremities: No LE edema.   Laboratory:  Recent Labs Lab 03/16/15 0402 03/17/15 0436 03/18/15 0723  WBC 10.5 9.7 9.1  HGB 10.9* 10.2* 9.5*  HCT 33.4* 31.8* 29.4*  PLT 316 256 241    Recent Labs Lab 03/15/15 1216 03/16/15 0402 03/17/15 0436  NA 142 142 140  K 3.7 4.0 3.8  CL 102 109 111  CO2 BUN 56*  54* 50*  CREATININE 5.19* 5.21* 5.05*  CALCIUM 9.8 8.7 8.4  PROT 8.7*  --   --   BILITOT 0.5  --   --   ALKPHOS 81  --   --   ALT 13  --   --   AST 25  --   --   GLUCOSE 148* 128* 108*   Imaging: Ct Abdomen Pelvis Wo Contrast  03/15/2015   CLINICAL DATA:  Abdomen pain and vomiting starting last night  EXAM: CT ABDOMEN AND PELVIS WITHOUT CONTRAST  TECHNIQUE: Multidetector CT imaging of the abdomen and pelvis was performed  following the standard protocol without IV contrast. Oral contrast is administered.  COMPARISON:  December 28, 2013  FINDINGS: There is calcified granuloma within the liver. The liver is otherwise normal. The spleen, gallbladder, adrenal glands are normal. There is a low-density lesion in the upper pole of the right kidney measuring 1.3 cm unchanged compared to prior CT. There are small nonobstructing stones in both kidneys. No hydronephrosis is noted bilaterally. There is a 2.4 cm cystic lesion at the head the pancreas enlarged compared to prior exam. There is atherosclerosis of the abdominal aorta without aneurysmal dilatation.  Images of the bowel demonstrate dilated small bowel loops starting at the proximal jejunum and involving the jejunum proximal ileum. The mid and distal ileal loops are normal in caliber. There is diverticulosis of the colon without diverticulitis. The appendix is not seen but no inflammation is noted around the cecum.  Fluid-filled bladder is normal. Multiple pelvic phleboliths are unchanged. The uterus is not seen. There is atelectasis of the bilateral lung bases. Degenerative joint changes of the spine are identified.  IMPRESSION: Dilated small bowel loops starting at the proximal jejunal loop involving the jejunum through probably the proximal ileum suspicious for small bowel obstruction.  Probable right renal cyst unchanged.  Bilateral nephrolithiasis.  Low attenuation round lesion and the head the pancreas, slightly enlarged compared to prior exam.   Electronically Signed   By: Sherian ReinWei-Chen  Lin M.D.   On: 03/15/2015 16:44   Dg Abd 1 View  03/16/2015   CLINICAL DATA:  Small-bowel obstruction  EXAM: ABDOMEN - 1 VIEW  COMPARISON:  None.  FINDINGS: A nasogastric catheter was placed under fluoroscopic guidance. This was placed into the gastric lumen and confirmed fluoroscopically.  Radiation Exposure Index (as provided by the fluoroscopic device): Not available  If the device does not  provide the exposure index:  Fluoroscopy Time:  20 seconds  Number of Acquired Images:  1  IMPRESSION: Successful fluoroscopic placement of nasogastric catheter.   Electronically Signed   By: Alcide CleverMark  Lukens M.D.   On: 03/16/2015 11:28   Dg Abd 2 Views  03/17/2015   CLINICAL DATA:  Upper lower epigastric pain  EXAM: ABDOMEN - 2 VIEW  COMPARISON:  CT abdomen 03/15/2015  FINDINGS: NG tube looped within the gastric fundus. Oral contrast from CT 03/15/2015 has progressed to the distal descending colon. No dilated loops of large or small bowel.  IMPRESSION: No evidence of bowel obstruction with progression of oral contrast to the right colon.   Electronically Signed   By: Genevive BiStewart  Edmunds M.D.   On: 03/17/2015 09:19   Dg Abd 2 Views  03/16/2015   CLINICAL DATA:  Small bowel obstruction.  EXAM: ABDOMEN - 2 VIEW  COMPARISON:  CT abdomen 03/15/2015  FINDINGS: There are abnormal dilated loop of bowel in the left lower abdomen with an air-fluid level most consistent with small-bowel obstruction. There  is no bowel dilatation to suggest obstruction. There is no evidence of pneumoperitoneum, portal venous gas or pneumatosis. There are no pathologic calcifications along the expected course of the ureters.  The osseous structures are unremarkable.  IMPRESSION: Findings consistent with small bowel obstruction.   Electronically Signed   By: Elige Ko   On: 03/16/2015 09:04   Dg Abd Portable 1v  03/18/2015   CLINICAL DATA:  Follow-up small bowel obstruction.  EXAM: PORTABLE ABDOMEN - 1 VIEW  COMPARISON:  03/17/2015  FINDINGS: An NG tube is coiled in the mid stomach.  Oral contrast within the colon is again identified.  The bowel gas pattern is unchanged with a minimally distended loop of small bowel within the left central abdomen.  IMPRESSION: Unchanged bowel gas pattern.   Electronically Signed   By: Harmon Pier M.D.   On: 03/18/2015 09:42   Dg Vangie Bicker G Tube Plc W/fl-no Rad  03/16/2015   CLINICAL DATA:    NASO G TUBE  PLACEMENT WITH FLUORO  Fluoroscopy was utilized by the requesting physician.  No radiographic  interpretation.    Bonney Aid, MD 03/19/2015, 5:38 AM PGY-1, [facility] FPTS Intern pager: [phone], text pages welcome

## 2015-03-19 NOTE — Discharge Summary (Signed)
Family Medicine Teaching [facility] Discharge Summary  Patient name: Monica [name] Medical record number: [id number] Date of birth: 31-May-1932 Age: 81 y.o. Gender: female Date of Admission: 03/15/2015  Date of Discharge: 03/21/2015  Admitting Physician: Tobey Grim, MD  Primary Care Provider: Billee Cashing, MD Consultants:  General Surgery Nephrology Vascular Surgery  Indication for Hospitalization:  Small Bowel obstruction, Worsening Renal Failure   Discharge Diagnoses/Problem List:  Patient Active Problem List   Diagnosis Date Noted  . AKI (acute kidney injury)   . Essential hypertension   . Chronic kidney disease (CKD), stage IV (severe)   . Pancreatic cyst   . Hematuria   . Encounter for nasogastric (NG) tube placement   . SBO (small bowel obstruction)   . Small bowel obstruction 03/15/2015  . Abscess of buttock 12/29/2013  . HTN (hypertension) 12/29/2013  . PAF (paroxysmal atrial fibrillation) 12/29/2013  . Melena 12/29/2013  . CKD (chronic kidney disease), stage IV 12/29/2013  . Leukocytosis 12/29/2013  . Escherichia coli urinary tract infection 12/29/2013  . Gastric ulcer with hemorrhage 12/29/2013  . Upper GI bleed 12/28/2013     Disposition: To home  Discharge Condition: Stable  Discharge Exam:   Temp: [98 F (36.7 C)-99 F (37.2 C)] 99 F (37.2 C) (04/12 2056) Pulse Rate: [9-71] 71 (04/12 2056) Resp: [16-18] 17 (04/12 2056) BP: (123-144)/(51-74) 123/51 mmHg (04/12 2056) SpO2: [97 %-99 %] 97 % (04/12 2056) Weight: [173 lb 15.1 oz (78.9 kg)-174 lb 9.7 oz (79.2 kg)] 174 lb 9.7 oz (79.2 kg) (04/12 2056)  Physical Exam: General: well appearing, NAD.  Cardiovascular: RRR, no murmurs, rubs or gallops noted. 1+ DP pulses bilaterally. Respiratory: CTAB. No crackles, rhonchi or wheezing. Abdomen: + BS, soft, nontender, nondistended.  Extremities: LUE AVF, + bruit, no LE edema  Brief Hospital Course:   Monica [name] is a 81 y.o. female  presenting with abdominal pain, NBNB emesis and was found to have a small bowel obstruction. PMH is significant for GERD, nephrolithiasis, HTN, CKD stage V, and PAF.  Small bowel obstruction:  On admission she had diffuse abdominal pain with nausea and vomiting. On abdominal xraya nd CT scan she had evidence of a small bowel obstrucion. Surgery was then consulted and the decision was made to make her NPO and place a naso gastric tube. She had serial abdominal Xrays and when her small bowel obstruction was resolved the nasogastric tube was removed and her diet was advanced a tolerated    Hemoccult positive:  She was found to be hemmaccolt positive on admission but had no symptoms of melena. GI was consulted and felt she had no need for inpatient management. Her Hgb remained stable. She was maintained on Protonix daily.  AKI on CKD, stage V:  Worsened to CKD stage V and the decision was made to proceed with Dialysis for management. A left upper extremity AVF was made while inpatient.    Cyst at the head of the pancreas:  On admission imaging a cyst was noted at teh head of the pancreas and sugery was consulted for this as well. THey did not feel it was malignant but recommended further testing with a Ca 19-9 which was normal.   Hypertension: She remained stable and was maintained on her home amlodipine  and metoprolol  BID when taking NPO, but while inpatient on labetalol IV as needed.  PAF:  She remained stable in normal sinus rhythm on her home metoprolol  BID. Her CHA2DS2VASC score was 5. She  is not currently anticoagulated.    FEN/GI:  She was maintained NPO until resolution of small bowel obstruction then her diet was advanced to  carb controlled diet per GI which was well tolerated  Prophylaxis: SCDs.  Disposition: Fistula placement today, most likely D/C tomorrow    Issues for Follow Up:  Consider initiation of anticoagulation in setting of CHA2DS2VASC score was  5  Significant Procedures: Left upper extremity AV fistula placment  Significant Labs and Imaging:   Recent Labs Lab 03/17/15 0436 03/18/15 0723 03/19/15 0443  WBC 9.7 9.1 9.3  HGB 10.2* 9.5* 9.5*  HCT 31.8* 29.4* 29.8*  PLT 256 241 249    Recent Labs Lab 03/15/15 1216 03/16/15 0402 03/17/15 0436  NA 142 142 140  K 3.7 4.0 3.8  CL 102 109 111  CO2 24 23 22   GLUCOSE 148* 128* 108*  BUN 56* 54* 50*  CREATININE 5.19* 5.21* 5.05*  CALCIUM 9.8 8.7 8.4  PHOS  --   --  3.7  ALKPHOS 81  --   --   AST 25  --   --   ALT 13  --   --   ALBUMIN 4.4  --  3.4*      Results/Tests Pending at Time of Discharge: None  Discharge Medications:    Medication List    ASK your doctor about these medications        acetaminophen 500 MG tablet  Commonly known as:  TYLENOL  Take 1,000 mg by mouth every 6 (six) hours as needed for mild pain.     ALPRAZolam 0.5 MG tablet  Commonly known as:  XANAX     amLODipine 10 MG tablet  Commonly known as:  NORVASC  Take 10 mg by mouth daily.     ciprofloxacin 500 MG tablet  Commonly known as:  CIPRO  Take 1 tablet (500 mg total) by mouth 2 (two) times daily.     doxycycline 100 MG tablet  Commonly known as:  VIBRA-TABS  Take 1 tablet (100 mg total) by mouth 2 (two) times daily.     metoprolol tartrate 25 MG tablet  Commonly known as:  LOPRESSOR  Take 1 tablet (25 mg total) by mouth 2 (two) times daily.     nitroGLYCERIN 0.4 MG SL tablet  Commonly known as:  NITROSTAT  Place 0.4 mg under the tongue every 5 (five) minutes as needed for chest pain.     pantoprazole 40 MG tablet  Commonly known as:  PROTONIX  Take 1 tablet (40 mg total) by mouth daily at 6 (six) AM.     solifenacin 5 MG tablet  Commonly known as:  VESICARE  Take 5 mg by mouth daily.     traMADol 50 MG tablet  Commonly known as:  ULTRAM     VISINE OP  Place 2 drops into both eyes daily as needed (dry eyes).        Discharge Instructions: Please refer to  Patient Instructions section of EMR for full details.  Patient was counseled important signs and symptoms that should prompt return to medical care, changes in medications, dietary instructions, activity restrictions, and follow up appointments.    Follow-Up Appointments:      Follow-up Information    Follow up with Billee CashingMCKENZIE, WAYLAND, MD.   Specialty:  Family Medicine   Contact information:   [address] [place] [place] [address] [phone]       Bonney AidAlyssa A Haney, MD 03/19/2015, 11:58 AM PGY-1,  [place]

## 2015-03-20 ENCOUNTER — Encounter (HOSPITAL_COMMUNITY): Payer: Self-pay | Admitting: Critical Care Medicine

## 2015-03-20 ENCOUNTER — Other Ambulatory Visit: Payer: Self-pay

## 2015-03-20 ENCOUNTER — Encounter (HOSPITAL_COMMUNITY)
Admission: EM | Disposition: A | Discharge status: Home / Self Care | Payer: Self-pay | Source: Home / Self Care | Attending: Family Medicine

## 2015-03-20 ENCOUNTER — Telehealth: Payer: Self-pay | Admitting: Vascular Surgery

## 2015-03-20 ENCOUNTER — Inpatient Hospital Stay (HOSPITAL_COMMUNITY): Payer: Medicare PPO | Admitting: Critical Care Medicine

## 2015-03-20 DIAGNOSIS — Z48812 Encounter for surgical aftercare following surgery on the circulatory system: Secondary | ICD-10-CM

## 2015-03-20 DIAGNOSIS — N186 End stage renal disease: Secondary | ICD-10-CM

## 2015-03-20 HISTORY — PX: AV FISTULA PLACEMENT: SHX1204

## 2015-03-20 LAB — CBC
HEMATOCRIT: 28.9 % — AB (ref 36.0–46.0)
HEMOGLOBIN: 9.5 g/dL — AB (ref 12.0–15.0)
MCH: 28.5 pg (ref 26.0–34.0)
MCHC: 32.9 g/dL (ref 30.0–36.0)
MCV: 86.8 fL (ref 78.0–100.0)
Platelets: 256 10*3/uL (ref 150–400)
RBC: 3.33 MIL/uL — ABNORMAL LOW (ref 3.87–5.11)
RDW: 13.8 % (ref 11.5–15.5)
WBC: 7.7 10*3/uL (ref 4.0–10.5)

## 2015-03-20 LAB — PROTIME-INR
INR: 1.1 (ref 0.00–1.49)
Prothrombin Time: 14.3 seconds (ref 11.6–15.2)

## 2015-03-20 LAB — RENAL FUNCTION PANEL
Albumin: 3.2 g/dL — ABNORMAL LOW (ref 3.5–5.2)
Anion gap: 13 (ref 5–15)
BUN: 38 mg/dL — ABNORMAL HIGH (ref 6–23)
CALCIUM: 8.5 mg/dL (ref 8.4–10.5)
CHLORIDE: 107 mmol/L (ref 96–112)
CO2: 17 mmol/L — ABNORMAL LOW (ref 19–32)
Creatinine, Ser: 5.01 mg/dL — ABNORMAL HIGH (ref 0.50–1.10)
GFR calc Af Amer: 9 mL/min — ABNORMAL LOW (ref >90)
GFR, EST NON AFRICAN AMERICAN: 7 mL/min — AB (ref >90)
GLUCOSE: 101 mg/dL — AB (ref 70–99)
PHOSPHORUS: 3.7 mg/dL (ref 2.3–4.6)
POTASSIUM: 3.8 mmol/L (ref 3.5–5.1)
SODIUM: 137 mmol/L (ref 135–145)

## 2015-03-20 SURGERY — ARTERIOVENOUS (AV) FISTULA CREATION
Anesthesia: Monitor Anesthesia Care | Site: Arm Upper | Laterality: Left

## 2015-03-20 MED ORDER — PROPOFOL 10 MG/ML IV BOLUS
INTRAVENOUS | Status: DC | PRN
  Administered 2015-03-20: 20 mg via INTRAVENOUS

## 2015-03-20 MED ORDER — MIDAZOLAM HCL 5 MG/5ML IJ SOLN
INTRAMUSCULAR | Status: DC | PRN
  Administered 2015-03-20 (×2): 1 mg via INTRAVENOUS

## 2015-03-20 MED ORDER — HEPARIN SODIUM (PORCINE) 1000 UNIT/ML IJ SOLN
INTRAMUSCULAR | Status: DC | PRN
  Administered 2015-03-20: 6000 [IU] via INTRAVENOUS

## 2015-03-20 MED ORDER — MORPHINE SULFATE 2 MG/ML IJ SOLN
1.0000 mg | Freq: Once | INTRAMUSCULAR | Status: AC
  Administered 2015-03-20: 1 mg via INTRAVENOUS
  Filled 2015-03-20: qty 1

## 2015-03-20 MED ORDER — SODIUM CHLORIDE 0.9 % IJ SOLN
INTRAMUSCULAR | Status: AC
  Filled 2015-03-20: qty 10

## 2015-03-20 MED ORDER — LIDOCAINE HCL (PF) 1 % IJ SOLN
INTRAMUSCULAR | Status: AC
  Filled 2015-03-20: qty 30

## 2015-03-20 MED ORDER — EPHEDRINE SULFATE 50 MG/ML IJ SOLN
INTRAMUSCULAR | Status: AC
  Filled 2015-03-20: qty 1

## 2015-03-20 MED ORDER — PHENYLEPHRINE 40 MCG/ML (10ML) SYRINGE FOR IV PUSH (FOR BLOOD PRESSURE SUPPORT)
PREFILLED_SYRINGE | INTRAVENOUS | Status: AC
  Filled 2015-03-20: qty 10

## 2015-03-20 MED ORDER — THROMBIN 20000 UNITS EX SOLR
CUTANEOUS | Status: AC
  Filled 2015-03-20: qty 20000

## 2015-03-20 MED ORDER — SUCCINYLCHOLINE CHLORIDE 20 MG/ML IJ SOLN
INTRAMUSCULAR | Status: AC
Start: 2015-03-20 — End: 2015-03-20
  Filled 2015-03-20: qty 1

## 2015-03-20 MED ORDER — ONDANSETRON HCL 4 MG/2ML IJ SOLN
INTRAMUSCULAR | Status: DC | PRN
  Administered 2015-03-20: 4 mg via INTRAVENOUS

## 2015-03-20 MED ORDER — PROPOFOL 10 MG/ML IV BOLUS
INTRAVENOUS | Status: AC
  Filled 2015-03-20: qty 20

## 2015-03-20 MED ORDER — PAPAVERINE HCL 30 MG/ML IJ SOLN
INTRAMUSCULAR | Status: AC
  Filled 2015-03-20: qty 2

## 2015-03-20 MED ORDER — LIDOCAINE HCL (PF) 1 % IJ SOLN
INTRAMUSCULAR | Status: DC | PRN
  Administered 2015-03-20: 17 mL via SUBCUTANEOUS

## 2015-03-20 MED ORDER — PHENYLEPHRINE HCL 10 MG/ML IJ SOLN
INTRAMUSCULAR | Status: DC | PRN
  Administered 2015-03-20 (×3): 80 ug via INTRAVENOUS

## 2015-03-20 MED ORDER — HEPARIN SODIUM (PORCINE) 1000 UNIT/ML IJ SOLN
INTRAMUSCULAR | Status: AC
  Filled 2015-03-20: qty 1

## 2015-03-20 MED ORDER — FENTANYL CITRATE 0.05 MG/ML IJ SOLN
INTRAMUSCULAR | Status: DC | PRN
  Administered 2015-03-20: 25 ug via INTRAVENOUS
  Administered 2015-03-20: 50 ug via INTRAVENOUS

## 2015-03-20 MED ORDER — CEFAZOLIN SODIUM-DEXTROSE 2-3 GM-% IV SOLR
INTRAVENOUS | Status: DC | PRN
  Administered 2015-03-20: 2 g via INTRAVENOUS

## 2015-03-20 MED ORDER — MIDAZOLAM HCL 2 MG/2ML IJ SOLN
INTRAMUSCULAR | Status: AC
  Filled 2015-03-20: qty 2

## 2015-03-20 MED ORDER — ONDANSETRON HCL 4 MG/2ML IJ SOLN
INTRAMUSCULAR | Status: AC
  Filled 2015-03-20: qty 2

## 2015-03-20 MED ORDER — SODIUM CHLORIDE 0.9 % IV SOLN
INTRAVENOUS | Status: DC | PRN
  Administered 2015-03-20: 08:00:00 via INTRAVENOUS

## 2015-03-20 MED ORDER — 0.9 % SODIUM CHLORIDE (POUR BTL) OPTIME
TOPICAL | Status: DC | PRN
  Administered 2015-03-20: 1000 mL

## 2015-03-20 MED ORDER — SODIUM CHLORIDE 0.9 % IR SOLN
Status: DC | PRN
  Administered 2015-03-20: 08:00:00

## 2015-03-20 MED ORDER — FENTANYL CITRATE 0.05 MG/ML IJ SOLN
INTRAMUSCULAR | Status: AC
  Filled 2015-03-20: qty 5

## 2015-03-20 MED ORDER — PROPOFOL INFUSION 10 MG/ML OPTIME
INTRAVENOUS | Status: DC | PRN
  Administered 2015-03-20: 50 ug/kg/min via INTRAVENOUS

## 2015-03-20 MED ORDER — PROTAMINE SULFATE 10 MG/ML IV SOLN
INTRAVENOUS | Status: AC
  Filled 2015-03-20: qty 5

## 2015-03-20 MED ORDER — LIDOCAINE HCL (CARDIAC) 20 MG/ML IV SOLN
INTRAVENOUS | Status: AC
  Filled 2015-03-20: qty 5

## 2015-03-20 MED ORDER — PROTAMINE SULFATE 10 MG/ML IV SOLN
INTRAVENOUS | Status: DC | PRN
Start: 2015-03-20 — End: 2015-03-20
  Administered 2015-03-20: 30 mg via INTRAVENOUS

## 2015-03-20 SURGICAL SUPPLY — 27 items
ARMBAND PINK RESTRICT EXTREMIT (MISCELLANEOUS) ×3 IMPLANT
CANISTER SUCTION 2500CC (MISCELLANEOUS) ×3 IMPLANT
CANNULA VESSEL 3MM 2 BLNT TIP (CANNULA) ×3 IMPLANT
CLIP TI MEDIUM 6 (CLIP) ×3 IMPLANT
CLIP TI WIDE RED SMALL 6 (CLIP) ×3 IMPLANT
COVER PROBE W GEL 5X96 (DRAPES) IMPLANT
DECANTER SPIKE VIAL GLASS SM (MISCELLANEOUS) ×1 IMPLANT
ELECT REM PT RETURN 9FT ADLT (ELECTROSURGICAL) ×3
ELECTRODE REM PT RTRN 9FT ADLT (ELECTROSURGICAL) ×1 IMPLANT
GLOVE BIO SURGEON STRL SZ7.5 (GLOVE) ×3 IMPLANT
GLOVE BIOGEL PI IND STRL 8 (GLOVE) ×1 IMPLANT
GLOVE BIOGEL PI INDICATOR 8 (GLOVE) ×2
GOWN STRL REUS W/ TWL LRG LVL3 (GOWN DISPOSABLE) ×3 IMPLANT
GOWN STRL REUS W/TWL LRG LVL3 (GOWN DISPOSABLE) ×9
KIT BASIN OR (CUSTOM PROCEDURE TRAY) ×3 IMPLANT
KIT ROOM TURNOVER OR (KITS) ×3 IMPLANT
LIQUID BAND (GAUZE/BANDAGES/DRESSINGS) ×3 IMPLANT
NS IRRIG 1000ML POUR BTL (IV SOLUTION) ×3 IMPLANT
PACK CV ACCESS (CUSTOM PROCEDURE TRAY) ×3 IMPLANT
PAD ARMBOARD 7.5X6 YLW CONV (MISCELLANEOUS) ×6 IMPLANT
SPONGE SURGIFOAM ABS GEL 100 (HEMOSTASIS) IMPLANT
SUT PROLENE 6 0 BV (SUTURE) ×3 IMPLANT
SUT VIC AB 3-0 SH 27 (SUTURE) ×3
SUT VIC AB 3-0 SH 27X BRD (SUTURE) ×1 IMPLANT
SUT VICRYL 4-0 PS2 18IN ABS (SUTURE) ×3 IMPLANT
UNDERPAD 30X30 INCONTINENT (UNDERPADS AND DIAPERS) ×3 IMPLANT
WATER STERILE IRR 1000ML POUR (IV SOLUTION) ×3 IMPLANT

## 2015-03-20 NOTE — H&P (View-Only) (Signed)
[facility] NOTE   MRN : [id number]  Reason for Consult: ESRD Referring Physician: Dr. Lowell Guitar  History of Present Illness: 81 y/o ESRD not on HD.  We have been asked to consult for permanent access placement.  She was admitted with small bowel obstruction symptoms several days ago which is now resolved.  She is on solid foods today.  She has been seen in the past for CKD and has had progressive decline of function and now is approaching need for hemodialysis.  She has had no prior access procedures.  She is right handed.  Past medical history includes hypertension, A fib,and gastric ulcers.  She takes a daily beta blocker.  She denies DM and hypercholesterolemia.        Current Facility-Administered Medications  Medication Dose Route Frequency Provider Last Rate Last Dose  . amLODipine (NORVASC) tablet 5 mg  5 mg Oral Daily Delano Metz, MD   5 mg at 03/19/15 1006  . dextrose 5 %-0.45 % sodium chloride infusion   Intravenous Continuous Tommie Sams, DO 10 mL/hr at 03/18/15 0803    . labetalol (NORMODYNE,TRANDATE) injection 10 mg  10 mg Intravenous Q2H PRN Glori Luis, MD      . metoprolol tartrate (LOPRESSOR) tablet 25 mg  25 mg Oral BID Delano Metz, MD   25 mg at 03/19/15 1007  . olopatadine (PATANOL) 0.1 % ophthalmic solution 1 drop  1 drop Both Eyes BID [facility], DO   1 drop at 03/18/15 1833  . ondansetron (ZOFRAN) injection 4 mg  4 mg Intravenous Q6H PRN Joanna Puff, MD   4 mg at 03/16/15 0446  . pantoprazole (PROTONIX) EC tablet 40 mg  40 mg Oral Daily Bonney Aid, MD   40 mg at 03/19/15 1006  . sodium chloride 0.9 % injection 3 mL  3 mL Intravenous Q12H Glori Luis, MD   3 mL at 03/19/15 1007  . traZODone (DESYREL) tablet 50 mg  50 mg Oral QHS [facility], DO   50 mg at 03/18/15 2229    Pt meds include: Statin :No Betablocker: Yes ASA: No Other anticoagulants/antiplatelets:   Past Medical History   Diagnosis Date  . Hypertension   . CKD (chronic kidney disease) stage 4, GFR 15-29 ml/min   . ETOHism     Quit in the 1980s.  Marland Kitchen GERD (gastroesophageal reflux disease)   . Arthritis   . Atrial flutter   . Chronic renal failure   . Hemorrhoids   . Dementia   . Gastric ulcer 12/2013.    Prepyloric ulcer. On follow-up EGD in 02/2014 this was 90% healed  . Nephrolithiasis   . Pancreatic cyst 09/2010.    At uncinate process.  Serial CT imaging favors benign process    Past Surgical History  Procedure Laterality Date  . Esophagogastroduodenoscopy N/A 12/29/2013    Procedure: ESOPHAGOGASTRODUODENOSCOPY (EGD);  Surgeon: Hart Carwin, MD;  Location: [facility] ENDOSCOPY;  Service: Endoscopy;  Laterality: N/A;  . Colonoscopy  2010    Per Dr. Loreta Ave  . Flexible sigmoidoscopy  08/2011    Per Dr. Randa Evens. Normal study. Random biopsies positive for trace melanosis coli.  . Surgical pinning  1996    Foot  . Total abdominal hysterectomy w/ bilateral salpingoophorectomy  1980  . Exploratory laparotomy with lysis of adhesions  1980    Social History History  Substance Use Topics  . Smoking status: Former Engineer, civil (consulting)  date: 02/14/1986  . Smokeless tobacco: Never Used  . Alcohol Use: No     Comment: former drinker    Family History Family History  Problem Relation Age of Onset  . Alcohol abuse Mother     Allergies  Allergen Reactions  . Aspirin Other (See Comments)    "makes my stomach hurt"  . Codeine Other (See Comments)    "i get irritated"     REVIEW OF SYSTEMS  General: [ ]  Weight loss, [ ]  Fever, [ ]  chills Neurologic: [ ]  Dizziness, [ ]  Blackouts, [ ]  Seizure [ ]  Stroke, [ ]  "Mini stroke", [ ]  Slurred speech, [ ]  Temporary blindness; [ ]  weakness in arms or legs, [ ]  Hoarseness [ ]  Dysphagia Cardiac: [ ]  Chest pain/pressure, [ ]  Shortness of breath at rest [ ]  Shortness of breath with exertion, [ ]  Atrial fibrillation or irregular heartbeat  Vascular: [ ]  Pain in legs with  walking, [ ]  Pain in legs at rest, [ ]  Pain in legs at night,  [ ]  Non-healing ulcer, [ ]  Blood clot in vein/DVT,   Pulmonary: [ ]  Home oxygen, [ ]  Productive cough, [ ]  Coughing up blood, [ ]  Asthma,  [ ]  Wheezing [ ]  COPD Musculoskeletal:  [ ]  Arthritis, [ ]  Low back pain, [ ]  Joint pain Hematologic: [ ]  Easy Bruising, [ ]  Anemia; [ ]  Hepatitis Gastrointestinal: [ ]  Blood in stool, [ ]  Gastroesophageal Reflux/heartburn, Urinary: [x ] chronic Kidney disease, [ ]  on HD - [ ]  MWF or [ ]  TTHS, [ ]  Burning with urination, [ ]  Difficulty urinating Skin: [ ]  Rashes, [ ]  Wounds Psychological: [ ]  Anxiety, [ ]  Depression  Physical Examination Filed Vitals:   03/18/15 1007 03/18/15 2146 03/19/15 0421 03/19/15 0900  BP: 160/49 122/80 153/79 145/79  Pulse: 93 71 80 75  Temp: 99 F (37.2 C) 99.2 F (37.3 C) 98.3 F (36.8 C) 99.2 F (37.3 C)  TempSrc: Oral Oral Oral   Resp: 17 16 18 16   Height:      Weight:   177 lb 0.5 oz (80.3 kg)   SpO2: 98% 100% 100% 100%   Body mass index is 33.47 kg/(m^2).  General:  WDWN in NAD Gait: Normal HENT: WNL Eyes: Pupils equal Pulmonary: normal non-labored breathing , without Rales, rhonchi,  wheezing Cardiac: RRR, without  Murmurs, rubs or gallops; No carotid bruits Abdomen: soft, NT, no masses Skin: no rashes, ulcers noted;  no Gangrene , no cellulitis; no open wounds;   Vascular Exam/Pulses:palpable radial and brachial pulses equal bil.   Musculoskeletal: no muscle wasting or atrophy; no edema  Neurologic: A&O X 3; Appropriate Affect ;  SENSATION: normal; MOTOR FUNCTION: 5/5 Symmetric Speech is fluent/normal   Significant Diagnostic Studies: CBC Lab Results  Component Value Date   WBC 9.3 03/19/2015   HGB 9.5* 03/19/2015   HCT 29.8* 03/19/2015   MCV 87.1 03/19/2015   PLT 249 03/19/2015    BMET    Component Value Date/Time   NA 140 03/17/2015 0436   K 3.8 03/17/2015 0436   CL 111 03/17/2015 0436   CO2 22 03/17/2015 0436    GLUCOSE 108* 03/17/2015 0436   BUN 50* 03/17/2015 0436   CREATININE 5.05* 03/17/2015 0436   CALCIUM 8.4 03/17/2015 0436   GFRNONAA 7* 03/17/2015 0436   GFRAA 8* 03/17/2015 0436   Estimated Creatinine Clearance: 8.5 mL/min (by C-G formula based on Cr of 5.05).  COAG Lab Results  Component Value  Date   INR 0.93 12/28/2013   INR 1.05 08/21/2011   INR 1.74* 07/20/2011     Non-Invasive Vascular Imaging: pending vein mapping  ASSESSMENT/PLAN:  ESRD not yet on HD She is right handed we will plan left AV fistula pending vein mapping results. Plan surgery tomorrow with Dr. Edilia Bo.  The patient is in agreement   Clinton Gallant [facility] 03/19/2015 11:55 AM  Agree with above.  Pt needs permanent access.  Will try to stay in left arm since this is non dominant but will depend on vein mapping.  We will need to remove all IVs from left arm if this is used.  Have called vascular lab to make sure vein map is done today.  Left arm AV access procedure discussed with pt as well as risk benefit possible complications including but not limited to bleeding infection steal non maturation access thrombosis  NPO p midnight Consent.  Fabienne Bruns, MD [facility] [place] Office: [phone] Pager: [phone]

## 2015-03-20 NOTE — Anesthesia Procedure Notes (Signed)
Procedure Name: MAC Date/Time: 03/20/2015 7:50 AM Performed by: Glo HerringLEE, Henry Utsey B Pre-anesthesia Checklist: Patient identified, Timeout performed, Suction available, Emergency Drugs available and Patient being monitored Patient Re-evaluated:Patient Re-evaluated prior to inductionOxygen Delivery Method: Simple face mask Intubation Type: IV induction Placement Confirmation: positive ETCO2 and breath sounds checked- equal and bilateral Dental Injury: Teeth and Oropharynx as per pre-operative assessment

## 2015-03-20 NOTE — Anesthesia Postprocedure Evaluation (Signed)
Anesthesia Post Note  Patient: Monica LimesMary S [name]  Procedure(s) Performed: Procedure(s) (LRB): ARTERIOVENOUS (AV) FISTULA CREATION (Left)  Anesthesia type: general  Patient location: PACU  Post pain: Pain level controlled  Post assessment: Patient's Cardiovascular Status Stable  Last Vitals:  Filed Vitals:   03/20/15 1713  BP: 133/57  Pulse: 9  Temp: 37.1 C  Resp: 18    Post vital signs: Reviewed and stable  Level of consciousness: sedated  Complications: No apparent anesthesia complications

## 2015-03-20 NOTE — Anesthesia Preprocedure Evaluation (Addendum)
Anesthesia Evaluation  Patient identified by MRN, date of birth, ID band Patient awake    Reviewed: Allergy & Precautions, NPO status , Patient's Chart, lab work & pertinent test results, reviewed documented beta blocker date and time   Airway Mallampati: II  TM Distance: >3 FB Neck ROM: Full    Dental  (+) Dental Advisory Given, Caps, Teeth Intact   Pulmonary former smoker,    Pulmonary exam normal       Cardiovascular hypertension, Pt. on medications and Pt. on home beta blockers + dysrhythmias Atrial Fibrillation     Neuro/Psych PSYCHIATRIC DISORDERS    GI/Hepatic PUD, GERD-  Medicated and Controlled,  Endo/Other    Renal/GU ESRFRenal disease     Musculoskeletal  (+) Arthritis -,   Abdominal   Peds  Hematology  (+) anemia ,   Anesthesia Other Findings   Reproductive/Obstetrics                          Anesthesia Physical Anesthesia Plan  ASA: III  Anesthesia Plan: MAC   Post-op Pain Management:    Induction: Intravenous  Airway Management Planned: Mask  Additional Equipment:   Intra-op Plan:   Post-operative Plan:   Informed Consent: I have reviewed the patients History and Physical, chart, labs and discussed the procedure including the risks, benefits and alternatives for the proposed anesthesia with the patient or authorized representative who has indicated his/her understanding and acceptance.   Dental advisory given  Plan Discussed with: Surgeon and CRNA  Anesthesia Plan Comments:        Anesthesia Quick Evaluation

## 2015-03-20 NOTE — Discharge Instructions (Signed)
° ° °  03/20/2015 Malva LimesMary S Grams [id number] [birth year]  Surgeon(s): Chuck Hinthristopher S Dickson, MD  Procedure(s): ARTERIOVENOUS (AV) FISTULA CREATION  x Do not stick fistula for 12 weeks

## 2015-03-20 NOTE — Telephone Encounter (Signed)
Phone # is disconnected, mailed letter to home address, dpm

## 2015-03-20 NOTE — Op Note (Signed)
    NAME: Monica [name]    MRN: [id number] DOB: [birth year]    DATE OF OPERATION: 03/20/2015  PREOP DIAGNOSIS: Stage IV chronic kidney disease  POSTOP DIAGNOSIS: Same  PROCEDURE: Left brachial cephalic AV fistula  SURGEON: Di Kindlehristopher S. Edilia Boickson, MD, FACS  ASSIST: Doreatha MassedSamantha Rhyne, PA  ANESTHESIA: local with sedation   EBL: minimal  INDICATIONS: Monica [name] is a 81 y.o. female who presents for new access.  FINDINGS: 4 mm upper arm cephalic vein.  TECHNIQUE: The patient was taken to the operating room and sedated by anesthesia. The left upper extremity was prepped and draped in usual sterile fashion. After the skin was anesthetized with 1% lidocaine, a transverse incision was made at the antecubital level area here the cephalic vein was dissected free and ligated distally. It was irrigated up with heparinized saline and was an approximately 4 mm vein. The brachial artery was dissected free beneath the fascia. The patient was heparinized. The brachial artery was clamped proximally and distally and a longitudinal arteriotomy was made. The vein is mobilized over and sewn end to side to the artery using continuous 6-0 Prolene suture. At the completion was an excellent thrill in the fistula. There was a radial signal with the Doppler. The heparin was partially reversed with protamine. The [facility] and the skin closed with 4-0 Vicryl. Dermabond was applied. The patient tolerated the procedure well and was transferred to the recovery room in stable condition. All needle and sponge counts were correct.  Waverly Ferrarihristopher Dickson, MD, FACS [facility]  DATE OF DICTATION:   03/20/2015

## 2015-03-20 NOTE — Progress Notes (Signed)
Family Medicine Teaching Service Daily Progress Note Intern Pager: [phone]  Patient name: Monica [name] Medical record number: [id number] Date of birth: 1931/12/28 Age: 81 y.o. Gender: female  Primary Care Provider: Billee Cashing, MD Consultants:  Surgery, GI Code Status: Full  Pt Overview and Major Events to Date:  4/7: Admitted for NBNB emesis, concern for SBO 4/10: Improving; Surgery continuing to follow. NGT discontinued, started on clears 4/11: Nephro and Vascular consulted given ESRD. Vein mapping 4/12: Left brachial cephalic AV fistula   Assessment and Plan: Monica [name] is a 81 y.o. female presenting with abdominal pain, NBNB emesis and was found to have a small bowel obstruction. PMH is significant for GERD, nephrolithiasis, HTN, CKD stage V, and PAF. SBO improving, now with worse renal function  Small bowel obstruction:   - Clinically and radiologically improved diet advanced to carb controlled diet on 4/11, NPO currently  - Surgery signed off.  - IV fluids KVO'd this am (see below).  Hemoccult positive: Patient with a h/o melena: at that time she had an EGD (12/2013) that revealed a prepyloric ulcer measuring 15-20 mm. An EGD in 02/2014 revealed reactive gastropathy with ulceration. She does have a h/o of alcoholism (1980s), however no EGDs have ever noted varices and no evidence of cirrhosis on CT. - Hb stable at 9.5 - GI signed off, pt to f/u as outpt with Dr. Lina Sar in 6wks (cont PPI daily) - Continuing protonix PO.  - Will continue to monitor closely.   AKI on CKD, stage V: Baseline Cr appears to be 3.2-3.3. Elevated to 5.19 on this admission. Last renal U/S revealed moderate increased cortical posterior echogenicity compatible with medical renal disease without hydronephrosis. - No improvement in Creatinine/GFR. - Renal consulted with plan to move to HD. Vein mapping 4/11, pt to undergo left AV fistula today with Dr. Edilia Bo. - Renal function panel stable -  KVO as had adequate fluid repletion  Cyst at the head of the pancreas:  - Per surgery unlikely to be malignant and can be worked up as an outpatient -CA 19-9 nl  Hypertension: - on amlodipine  and metoprolol  BID - labetalol  q2hr PRN BP >160/100 - continue to monitor   PAF: Currently in NSR - on home metoprolol  BID - not currently anticoagulated, CHA2DS2VASC 5, consider anticoagulation after AVF placement  - monitor on telemetry  FEN/GI:  - Carb controlled diet per GI, KVO. PPI  Prophylaxis: SCDs.  Disposition: Fistula placement today, most likely D/C tomorrow   Subjective:  Patient doing well overnight. Eager to go home.   Objective: Temp:  [98.4 F (36.9 C)-99.2 F (37.3 C)] 98.4 F (36.9 C) (04/12 0515) Pulse Rate:  [64-75] 64 (04/12 0515) Resp:  [16-18] 16 (04/12 0515) BP: (124-150)/(53-80) 138/67 mmHg (04/12 0515) SpO2:  [99 %-100 %] 99 % (04/12 0515) Weight:  [173 lb 15.1 oz (78.9 kg)] 173 lb 15.1 oz (78.9 kg) (04/12 0515)  Physical Exam: General: well appearing, NAD.  Cardiovascular: RRR, no murmurs, rubs or gallops noted. 1+ DP pulses bilaterally. Respiratory: CTAB. No crackles, rhonchi or wheezing. Abdomen: + BS, soft, nontender, nondistended.  Extremities: No LE edema.   Laboratory:  Recent Labs Lab 03/17/15 0436 03/18/15 0723 03/19/15 0443  WBC 9.7 9.1 9.3  HGB 10.2* 9.5* 9.5*  HCT 31.8* 29.4* 29.8*  PLT 256 241 249    Recent Labs Lab 03/15/15 1216 03/16/15 0402 03/17/15 0436 03/19/15 1049  NA 142 142 140 138  K 3.7 4.0  3.8 4.1  CL 102 109 111 108  CO2 24 23 22 19   BUN 56* 54* 50* 35*  CREATININE 5.19* 5.21* 5.05* 4.84*  CALCIUM 9.8 8.7 8.4 8.9  PROT 8.7*  --   --   --   BILITOT 0.5  --   --   --   ALKPHOS 81  --   --   --   ALT 13  --   --   --   AST 25  --   --   --   GLUCOSE 148* 128* 108* 90   Imaging: Ct Abdomen Pelvis Wo Contrast  03/15/2015   CLINICAL DATA:  Abdomen pain and vomiting starting last night   EXAM: CT ABDOMEN AND PELVIS WITHOUT CONTRAST  TECHNIQUE: Multidetector CT imaging of the abdomen and pelvis was performed following the standard protocol without IV contrast. Oral contrast is administered.  COMPARISON:  December 28, 2013  FINDINGS: There is calcified granuloma within the liver. The liver is otherwise normal. The spleen, gallbladder, adrenal glands are normal. There is a low-density lesion in the upper pole of the right kidney measuring 1.3 cm unchanged compared to prior CT. There are small nonobstructing stones in both kidneys. No hydronephrosis is noted bilaterally. There is a 2.4 cm cystic lesion at the head the pancreas enlarged compared to prior exam. There is atherosclerosis of the abdominal aorta without aneurysmal dilatation.  Images of the bowel demonstrate dilated small bowel loops starting at the proximal jejunum and involving the jejunum proximal ileum. The mid and distal ileal loops are normal in caliber. There is diverticulosis of the colon without diverticulitis. The appendix is not seen but no inflammation is noted around the cecum.  Fluid-filled bladder is normal. Multiple pelvic phleboliths are unchanged. The uterus is not seen. There is atelectasis of the bilateral lung bases. Degenerative joint changes of the spine are identified.  IMPRESSION: Dilated small bowel loops starting at the proximal jejunal loop involving the jejunum through probably the proximal ileum suspicious for small bowel obstruction.  Probable right renal cyst unchanged.  Bilateral nephrolithiasis.  Low attenuation round lesion and the head the pancreas, slightly enlarged compared to prior exam.   Electronically Signed   By: Sherian ReinWei-Chen  Lin M.D.   On: 03/15/2015 16:44   Dg Abd 1 View  03/16/2015   CLINICAL DATA:  Small-bowel obstruction  EXAM: ABDOMEN - 1 VIEW  COMPARISON:  None.  FINDINGS: A nasogastric catheter was placed under fluoroscopic guidance. This was placed into the gastric lumen and confirmed  fluoroscopically.  Radiation Exposure Index (as provided by the fluoroscopic device): Not available  If the device does not provide the exposure index:  Fluoroscopy Time:  20 seconds  Number of Acquired Images:  1  IMPRESSION: Successful fluoroscopic placement of nasogastric catheter.   Electronically Signed   By: Alcide CleverMark  Lukens M.D.   On: 03/16/2015 11:28   Dg Abd 2 Views  03/17/2015   CLINICAL DATA:  Upper lower epigastric pain  EXAM: ABDOMEN - 2 VIEW  COMPARISON:  CT abdomen 03/15/2015  FINDINGS: NG tube looped within the gastric fundus. Oral contrast from CT 03/15/2015 has progressed to the distal descending colon. No dilated loops of large or small bowel.  IMPRESSION: No evidence of bowel obstruction with progression of oral contrast to the right colon.   Electronically Signed   By: Genevive BiStewart  Edmunds M.D.   On: 03/17/2015 09:19   Dg Abd 2 Views  03/16/2015   CLINICAL DATA:  Small bowel obstruction.  EXAM: ABDOMEN - 2 VIEW  COMPARISON:  CT abdomen 03/15/2015  FINDINGS: There are abnormal dilated loop of bowel in the left lower abdomen with an air-fluid level most consistent with small-bowel obstruction. There is no bowel dilatation to suggest obstruction. There is no evidence of pneumoperitoneum, portal venous gas or pneumatosis. There are no pathologic calcifications along the expected course of the ureters.  The osseous structures are unremarkable.  IMPRESSION: Findings consistent with small bowel obstruction.   Electronically Signed   By: Elige Ko   On: 03/16/2015 09:04   Dg Abd Portable 1v  03/18/2015   CLINICAL DATA:  Follow-up small bowel obstruction.  EXAM: PORTABLE ABDOMEN - 1 VIEW  COMPARISON:  03/17/2015  FINDINGS: An NG tube is coiled in the mid stomach.  Oral contrast within the colon is again identified.  The bowel gas pattern is unchanged with a minimally distended loop of small bowel within the left central abdomen.  IMPRESSION: Unchanged bowel gas pattern.   Electronically Signed   By:  Harmon Pier M.D.   On: 03/18/2015 09:42   Dg Vangie Bicker G Tube Plc W/fl-no Rad  03/16/2015   CLINICAL DATA:    NASO G TUBE PLACEMENT WITH FLUORO  Fluoroscopy was utilized by the requesting physician.  No radiographic  interpretation.    Joanna Puff, MD 03/20/2015, 7:19 AM PGY-1, [facility] Family [facility] FPTS Intern pager: [phone], text pages welcome

## 2015-03-20 NOTE — Interval H&P Note (Signed)
History and Physical Interval Note:  03/20/2015 7:37 AM  Monica [name]  has presented today for surgery, with the diagnosis of End Stage Renal Disease N18.6  The various methods of treatment have been discussed with the patient and family. After consideration of risks, benefits and other options for treatment, the patient has consented to  Procedure(s): ARTERIOVENOUS (AV) FISTULA CREATION VERSUS GRAFT INSERTION (Left) as a surgical intervention .  The patient's history has been reviewed, patient examined, no change in status, stable for surgery.  I have reviewed the patient's chart and labs.  Questions were answered to the patient's satisfaction.     Loralai Eisman S

## 2015-03-20 NOTE — Progress Notes (Signed)
Assessment: 1. CKD stage V - need to prepare for long term ESRD care 2. N/V w pSBO - resolved 3. HTN - on meds   4.  S/p LUE AVF 03/20/15  Plan-My office will contact pt for an appt in 3-4 weeks  Subjective: Interval History: s/p AVF  Objective: Vital signs in last 24 hours: Temp:  [98 F (36.7 C)-99 F (37.2 C)] 98 F (36.7 C) (04/12 0907) Pulse Rate:  [64-72] 64 (04/12 0515) Resp:  [16-18] 16 (04/12 0515) BP: (124-150)/(53-80) 132/66 mmHg (04/12 0911) SpO2:  [99 %-100 %] 99 % (04/12 0515) Weight:  [78.9 kg (173 lb 15.1 oz)] 78.9 kg (173 lb 15.1 oz) (04/12 0515) Weight change: -1.4 kg (-3 lb 1.4 oz)  Intake/Output from previous day: 04/11 0701 - 04/12 0700 In: 720 [P.O.:720] Out: 2 [Urine:1; Stool:1] Intake/Output this shift: Total I/O In: 1020 [P.O.:720; I.V.:300] Out: 16 [Urine:1; Blood:15]  General appearance: alert and cooperative Resp: clear to auscultation bilaterally Cardio: regular rate and rhythm, S1, S2 normal, no murmur, click, rub or gallop Extremities: extremities normal, atraumatic, no cyanosis or edema and thrill LUE  Lab Results:  Recent Labs  03/19/15 0443 03/20/15 0625  WBC 9.3 7.7  HGB 9.5* 9.5*  HCT 29.8* 28.9*  PLT 249 256   BMET:  Recent Labs  03/19/15 1049 03/20/15 0625  NA 138 137  K 4.1 3.8  CL 108 107  CO2 19 17*  GLUCOSE 90 101*  BUN 35* 38*  CREATININE 4.84* 5.01*  CALCIUM 8.9 8.5   No results for input(s): PTH in the last 72 hours. Iron Studies: No results for input(s): IRON, TIBC, TRANSFERRIN, FERRITIN in the last 72 hours. Studies/Results: No results found.  Scheduled: . amLODipine  5 mg Oral Daily  .  ceFAZolin (ANCEF) IV  1 g Intravenous On Call  . loratadine  10 mg Oral Daily  . metoprolol tartrate  25 mg Oral BID  . olopatadine  1 drop Both Eyes BID  . pantoprazole  40 mg Oral Daily  . sodium chloride  3 mL Intravenous Q12H  . traZODone  50 mg Oral QHS    LOS: 5 days   Pranathi Winfree C 03/20/2015,2:04  PM

## 2015-03-20 NOTE — Telephone Encounter (Signed)
-----   Message from Phillips Odorarol S Pullins, RN sent at 03/20/2015  1:34 PM EDT ----- Regarding: Joyce GrossKay log; also needs 6 wk Left AVF duplex and f/u with CSD   ----- Message -----    From: Dara LordsSamantha J Rhyne, PA-C    Sent: 03/20/2015   8:50 AM      To: Vvs Charge Pool  S/p left BC AVF 03/20/15.  F/u with Dr. Edilia Boickson in 6 weeks with duplex.  Thanks, Lelon MastSamantha

## 2015-03-20 NOTE — Transfer of Care (Signed)
Immediate Anesthesia Transfer of Care Note  Patient: Monica LimesMary S Marsland  Procedure(s) Performed: Procedure(s): ARTERIOVENOUS (AV) FISTULA CREATION (Left)  Patient Location: PACU  Anesthesia Type:MAC  Level of Consciousness: awake, alert  and oriented  Airway & Oxygen Therapy: Patient Spontanous Breathing and Patient connected to face mask oxygen  Post-op Assessment: Report given to RN, Post -op Vital signs reviewed and stable and Patient moving all extremities X 4  Post vital signs: Reviewed and stable  Last Vitals:  Filed Vitals:   03/20/15 0515  BP: 138/67  Pulse: 64  Temp: 36.9 C  Resp: 16    Complications: No apparent anesthesia complications

## 2015-03-20 NOTE — Progress Notes (Signed)
Patient ID: Monica [name], female   DOB: 11-18-1932, 81 y.o.   MRN: [id number]     [facility]      [address] [place], [place] [address]    Phone: [phone] FAX: [phone]     Subjective: Had L AV fistula placed today.  Denies abdominal pain, nausea or vomiting.  Was tolerating solids yesterday and having BMs.   Objective:  Vital signs:  Filed Vitals:   03/19/15 2048 03/20/15 0515 03/20/15 0907 03/20/15 0911  BP: 124/53 138/67 144/74 132/66  Pulse: 72 64    Temp: 98.5 F (36.9 C) 98.4 F (36.9 C) 98 F (36.7 C)   TempSrc: Oral Oral    Resp: 16 16    Height:  5' 1.5" (1.562 m)    Weight:  78.9 kg (173 lb 15.1 oz)    SpO2: 100% 99%      Last BM Date: 03/19/15  Intake/Output   Yesterday:  04/11 0701 - 04/12 0700 In: 720 [P.O.:720] Out: 2 [Urine:1; Stool:1] This shift:  Total I/O In: 300 [I.V.:300] Out: 15 [Blood:15]    Physical Exam: General: Pt awake/alert/oriented x4 in no acute distress Abdomen: Soft.  Nondistended.  Non tender.  No evidence of peritonitis.  No incarcerated hernias.    Problem List:   Active Problems:   Small bowel obstruction   SBO (small bowel obstruction)   Essential hypertension   Chronic kidney disease (CKD), stage IV (severe)   Pancreatic cyst   Hematuria   Encounter for nasogastric (NG) tube placement   AKI (acute kidney injury)    Results:   Labs: Results for orders placed or performed during the hospital encounter of 03/15/15 (from the past 48 hour(s))  CBC     Status: Abnormal   Collection Time: 03/19/15  4:43 AM  Result Value Ref Range   WBC 9.3 4.0 - 10.5 K/uL   RBC 3.42 (L) 3.87 - 5.11 MIL/uL   Hemoglobin 9.5 (L) 12.0 - 15.0 g/dL   HCT 29.8 (L) 36.0 - 46.0 %   MCV 87.1 78.0 - 100.0 fL   MCH 27.8 26.0 - 34.0 pg   MCHC 31.9 30.0 - 36.0 g/dL   RDW 13.7 11.5 - 15.5 %   Platelets 249 150 - 400 K/uL  Renal function panel     Status: Abnormal   Collection  Time: 03/19/15 10:49 AM  Result Value Ref Range   Sodium 138 135 - 145 mmol/L   Potassium 4.1 3.5 - 5.1 mmol/L   Chloride 108 96 - 112 mmol/L   CO2 19 19 - 32 mmol/L   Glucose, Bld 90 70 - 99 mg/dL   BUN 35 (H) 6 - 23 mg/dL   Creatinine, Ser 4.84 (H) 0.50 - 1.10 mg/dL   Calcium 8.9 8.4 - 10.5 mg/dL   Phosphorus 3.2 2.3 - 4.6 mg/dL   Albumin 3.6 3.5 - 5.2 g/dL   GFR calc non Af Amer 8 (L) >90 mL/min   GFR calc Af Amer 9 (L) >90 mL/min    Comment: (NOTE) The eGFR has been calculated using the CKD EPI equation. This calculation has not been validated in all clinical situations. eGFR's persistently <90 mL/min signify possible Chronic Kidney Disease.    Anion gap 11 5 - 15  CBC     Status: Abnormal   Collection Time: 03/20/15  6:25 AM  Result Value Ref Range   WBC 7.7 4.0 - 10.5 K/uL   RBC 3.33 (  L) 3.87 - 5.11 MIL/uL   Hemoglobin 9.5 (L) 12.0 - 15.0 g/dL   HCT 28.9 (L) 36.0 - 46.0 %   MCV 86.8 78.0 - 100.0 fL   MCH 28.5 26.0 - 34.0 pg   MCHC 32.9 30.0 - 36.0 g/dL   RDW 13.8 11.5 - 15.5 %   Platelets 256 150 - 400 K/uL  Renal function panel     Status: Abnormal   Collection Time: 03/20/15  6:25 AM  Result Value Ref Range   Sodium 137 135 - 145 mmol/L   Potassium 3.8 3.5 - 5.1 mmol/L   Chloride 107 96 - 112 mmol/L   CO2 17 (L) 19 - 32 mmol/L   Glucose, Bld 101 (H) 70 - 99 mg/dL   BUN 38 (H) 6 - 23 mg/dL   Creatinine, Ser 5.01 (H) 0.50 - 1.10 mg/dL   Calcium 8.5 8.4 - 10.5 mg/dL   Phosphorus 3.7 2.3 - 4.6 mg/dL   Albumin 3.2 (L) 3.5 - 5.2 g/dL   GFR calc non Af Amer 7 (L) >90 mL/min   GFR calc Af Amer 9 (L) >90 mL/min    Comment: (NOTE) The eGFR has been calculated using the CKD EPI equation. This calculation has not been validated in all clinical situations. eGFR's persistently <90 mL/min signify possible Chronic Kidney Disease.    Anion gap 13 5 - 15  Protime-INR     Status: None   Collection Time: 03/20/15  6:25 AM  Result Value Ref Range   Prothrombin Time 14.3  11.6 - 15.2 seconds   INR 1.10 0.00 - 1.49    Imaging / Studies: No results found.  Medications / Allergies:  Scheduled Meds: . amLODipine  5 mg Oral Daily  .  ceFAZolin (ANCEF) IV  1 g Intravenous On Call  . loratadine  10 mg Oral Daily  . metoprolol tartrate  25 mg Oral BID  . olopatadine  1 drop Both Eyes BID  . pantoprazole  40 mg Oral Daily  . sodium chloride  3 mL Intravenous Q12H  . traZODone  50 mg Oral QHS   Continuous Infusions: . dextrose 5 % and 0.45% NaCl 10 mL/hr at 03/18/15 0803   PRN Meds:.acetaminophen, labetalol, ondansetron (ZOFRAN) IV  Antibiotics: Anti-infectives    Start     Dose/Rate Route Frequency Ordered Stop   03/20/15 0600  ceFAZolin (ANCEF) IVPB 1 g/50 mL premix    Comments:  Send with pt to OR   1 g 100 mL/hr over 30 Minutes Intravenous On call 03/19/15 1240 03/21/15 0600        Assessment/Plan SBO -clinically resolved.  Tolerating solids, having BMs, benign abdominal exam -no surgical indications -s/o please call with questions or concerns.   Erby Pian, [facility] Pager [phone]) For consults and floor pages call [phone](7A-4:30P)  03/20/2015 9:41 AM

## 2015-03-21 LAB — CBC
HCT: 32.5 % — ABNORMAL LOW (ref 36.0–46.0)
Hemoglobin: 10.4 g/dL — ABNORMAL LOW (ref 12.0–15.0)
MCH: 28.6 pg (ref 26.0–34.0)
MCHC: 32 g/dL (ref 30.0–36.0)
MCV: 89.3 fL (ref 78.0–100.0)
Platelets: 204 10*3/uL (ref 150–400)
RBC: 3.64 MIL/uL — ABNORMAL LOW (ref 3.87–5.11)
RDW: 14 % (ref 11.5–15.5)
WBC: 8.6 10*3/uL (ref 4.0–10.5)

## 2015-03-21 LAB — RENAL FUNCTION PANEL
Albumin: 3.2 g/dL — ABNORMAL LOW (ref 3.5–5.2)
Anion gap: 10 (ref 5–15)
BUN: 40 mg/dL — ABNORMAL HIGH (ref 6–23)
CO2: 19 mmol/L (ref 19–32)
Calcium: 8.8 mg/dL (ref 8.4–10.5)
Chloride: 111 mmol/L (ref 96–112)
Creatinine, Ser: 5.58 mg/dL — ABNORMAL HIGH (ref 0.50–1.10)
GFR calc non Af Amer: 6 mL/min — ABNORMAL LOW (ref >90)
GFR, EST AFRICAN AMERICAN: 8 mL/min — AB (ref >90)
GLUCOSE: 97 mg/dL (ref 70–99)
POTASSIUM: 4.7 mmol/L (ref 3.5–5.1)
Phosphorus: 5 mg/dL — ABNORMAL HIGH (ref 2.3–4.6)
SODIUM: 140 mmol/L (ref 135–145)

## 2015-03-21 MED ORDER — PANTOPRAZOLE SODIUM 40 MG PO TBEC: 40.0000 mg | DELAYED_RELEASE_TABLET | Freq: Every day | ORAL | Status: DC

## 2015-03-21 NOTE — Progress Notes (Addendum)
[facility]  Subjective  - Nice palpable thrill left BC fistula.   Objective 120/57 63 98 F (36.7 C) (Oral) 18 100%  Intake/Output Summary (Last 24 hours) at 03/21/15 0755 Last data filed at 03/21/15 0625  Gross per 24 hour  Intake   1500 ml  Output     22 ml  Net   1478 ml   Nice palpable thrill left BC fistula. Palpable left radial pulse Incision healing well  Assessment/Planning: POD # 1  F/U in our office in 6 weeks with Dr. Bobetta Limeickson  COLLINS, [facility] 03/21/2015 7:55 AM --  Laboratory Lab Results:  Recent Labs  03/20/15 0625 03/21/15 0444  WBC 7.7 8.6  HGB 9.5* 10.4*  HCT 28.9* 32.5*  PLT 256 204   BMET  Recent Labs  03/20/15 0625 03/21/15 0444  NA 137 140  K 3.8 4.7  CL 107 111  CO2 17* 19  GLUCOSE 101* 97  BUN 38* 40*  CREATININE 5.01* 5.58*  CALCIUM 8.5 8.8    COAG Lab Results  Component Value Date   INR 1.10 03/20/2015   INR 0.93 12/28/2013   INR 1.05 08/21/2011   No results found for: PTT  Agree with above. We will be available as needed.  Waverly Ferrarihristopher Marin Wisner, MD, FACS Beeper [phone] Office: [phone]

## 2015-03-21 NOTE — Progress Notes (Signed)
Spoke to patient and relay the message to her from her pharmacist at [facility],that she needs to go to her primary doctor to arrange her prescriptions signed medical personal with an active license so that she could have her fill her medicines.

## 2015-03-21 NOTE — Care Management Note (Unsigned)
    Page 1 of 1   03/21/2015     11:31:15 AM CARE MANAGEMENT NOTE 03/21/2015  Patient:  Monica [name],Monica [name]   Account Number:  [id number]  Date Initiated:  03/21/2015  Documentation initiated by:  Theo Reither  Subjective/Objective Assessment:   Pt adm on 03/15/15 with abd pain, SBO.  PTA, pt resides at home alone.  She has ESRD on chronic HD.     Action/Plan:   Will follow for dc needs as pt progresses.   Anticipated DC Date:  03/23/2015   Anticipated DC Plan:  HOME W HOME HEALTH SERVICES      DC Planning Services  CM consult      Choice offered to / List presented to:             Status of service:  In process, will continue to follow Medicare Important Message given?  YES (If response is "NO", the following Medicare IM given date fields will be blank) Date Medicare IM given:  03/21/2015 Medicare IM given by:  Lillyrose Reitan Date Additional Medicare IM given:   Additional Medicare IM given by:    Discharge Disposition:    Per UR Regulation:  Reviewed for med. necessity/level of care/duration of stay  If discussed at Long Length of Stay Meetings, dates discussed:    Comments:

## 2015-03-21 NOTE — Clinical Documentation Improvement (Signed)
  Please clarify conflicting documentation in this chart. Patient with GFR 9, 9, 8 and s/p this admit Left arm AVF. BUN 35, 38, 40. Cr 4.84, 5.01, 5.58.  Documented both CKD stage 5 and ESRD.  Possible Conditions -- CKD Stage 5  -- ESRD preparing for HD  -- Other condition -- Unable to clinically determine  Thank you,  Beverley FiedlerLaurie E Aryeh Butterfield ,RN Clinical Documentation Specialist:  [phone]  [facility]- Health Information Management

## 2015-03-21 NOTE — Progress Notes (Signed)
Family Medicine Teaching Service Daily Progress Note Intern Pager: [phone]  Patient name: Monica [name] Medical record number: [id number] Date of birth: Oct 12, 1932 Age: 81 y.o. Gender: female  Primary Care Provider: Billee CashingMCKENZIE, WAYLAND, MD Consultants:  Surgery, GI Code Status: Full  Pt Overview and Major Events to Date:  4/7: Admitted for NBNB emesis, concern for SBO 4/10: Improving; Surgery continuing to follow. NGT discontinued, started on clears 4/11: Nephro and Vascular consulted given ESRD. Vein mapping 4/12: Left brachial cephalic AV fistula   Assessment and Plan: Monica LimesMary S Dowty is a 81 y.o. female presenting with abdominal pain, NBNB emesis and was found to have a small bowel obstruction. PMH is significant for GERD, nephrolithiasis, HTN, CKD stage V, and PAF. SBO improved, s/p LUE AVF  Small bowel obstruction:   - Clinically and radiologically improved diet advanced to carb controlled diet on 4/11,  - Surgery signed off.  - IV fluids KVO'd this am (see below).  Hemoccult positive: Patient with a h/o melena: at that time she had an EGD (12/2013) that revealed a prepyloric ulcer measuring 15-20 mm. An EGD in 02/2014 revealed reactive gastropathy with ulceration. She does have a h/o of alcoholism (1980s), however no EGDs have ever noted varices and no evidence of cirrhosis on CT. - Hb stable at 9.5 - GI signed off, pt to f/u as outpt with Dr. Lina Sarora Brodie in 6wks (cont PPI daily) - Continuing protonix PO.  - Will continue to monitor closely.   AKI on CKD, stage V: Baseline Cr appears to be 3.2-3.3. Elevated to 5.19 on this admission. Last renal U/S revealed moderate increased cortical posterior echogenicity compatible with medical renal disease without hydronephrosis. - No improvement in Creatinine/GFR. - S/p LUE AVF - KVO as had adequate fluid repletion  Cyst at the head of the pancreas:  - Per surgery unlikely to be malignant and can be worked up as an outpatient -CA 19-9  nl  Hypertension: - On amlodipine 5mg  and metoprolol 25mg  BID - labetalol 10mg  q2hr PRN BP >160/100 - continue to monitor   PAF: Currently in NSR - on home metoprolol 25mg  BID - not currently anticoagulated, CHA2DS2VASC 5, consider anticoagulation as an outpatient - monitor on telemetry  FEN/GI:  - Carb controlled diet per GI, KVO. PPI  Prophylaxis: SCDs.  Disposition: Anticipate D/C today   Subjective:  Pt did well overnight, no issues  Objective: Temp:  [98 F (36.7 C)-99 F (37.2 C)] 99 F (37.2 C) (04/12 2056) Pulse Rate:  [9-71] 71 (04/12 2056) Resp:  [16-18] 17 (04/12 2056) BP: (123-144)/(51-74) 123/51 mmHg (04/12 2056) SpO2:  [97 %-99 %] 97 % (04/12 2056) Weight:  [173 lb 15.1 oz (78.9 kg)-174 lb 9.7 oz (79.2 kg)] 174 lb 9.7 oz (79.2 kg) (04/12 2056)  Physical Exam: General: well appearing, NAD.  Cardiovascular: RRR, no murmurs, rubs or gallops noted. 1+ DP pulses bilaterally. Respiratory: CTAB. No crackles, rhonchi or wheezing. Abdomen: + BS, soft, nontender, nondistended.  Extremities: LUE AVF, + bruit no LE edema  Laboratory:  Recent Labs Lab 03/18/15 0723 03/19/15 0443 03/20/15 0625  WBC 9.1 9.3 7.7  HGB 9.5* 9.5* 9.5*  HCT 29.4* 29.8* 28.9*  PLT 241 249 256    Recent Labs Lab 03/15/15 1216  03/17/15 0436 03/19/15 1049 03/20/15 0625  NA 142  < > 140 138 137  K 3.7  < > 3.8 4.1 3.8  CL 102  < > 111 108 107  CO2 24  < > 22 19 17*  BUN 56*  < > 50* 35* 38*  CREATININE 5.19*  < > 5.05* 4.84* 5.01*  CALCIUM 9.8  < > 8.4 8.9 8.5  PROT 8.7*  --   --   --   --   BILITOT 0.5  --   --   --   --   ALKPHOS 81  --   --   --   --   ALT 13  --   --   --   --   AST 25  --   --   --   --   GLUCOSE 148*  < > 108* 90 101*  < > = values in this interval not displayed. Imaging: Ct Abdomen Pelvis Wo Contrast  03/15/2015   CLINICAL DATA:  Abdomen pain and vomiting starting last night  EXAM: CT ABDOMEN AND PELVIS WITHOUT CONTRAST  TECHNIQUE: Multidetector  CT imaging of the abdomen and pelvis was performed following the standard protocol without IV contrast. Oral contrast is administered.  COMPARISON:  December 28, 2013  FINDINGS: There is calcified granuloma within the liver. The liver is otherwise normal. The spleen, gallbladder, adrenal glands are normal. There is a low-density lesion in the upper pole of the right kidney measuring 1.3 cm unchanged compared to prior CT. There are small nonobstructing stones in both kidneys. No hydronephrosis is noted bilaterally. There is a 2.4 cm cystic lesion at the head the pancreas enlarged compared to prior exam. There is atherosclerosis of the abdominal aorta without aneurysmal dilatation.  Images of the bowel demonstrate dilated small bowel loops starting at the proximal jejunum and involving the jejunum proximal ileum. The mid and distal ileal loops are normal in caliber. There is diverticulosis of the colon without diverticulitis. The appendix is not seen but no inflammation is noted around the cecum.  Fluid-filled bladder is normal. Multiple pelvic phleboliths are unchanged. The uterus is not seen. There is atelectasis of the bilateral lung bases. Degenerative joint changes of the spine are identified.  IMPRESSION: Dilated small bowel loops starting at the proximal jejunal loop involving the jejunum through probably the proximal ileum suspicious for small bowel obstruction.  Probable right renal cyst unchanged.  Bilateral nephrolithiasis.  Low attenuation round lesion and the head the pancreas, slightly enlarged compared to prior exam.   Electronically Signed   By: Sherian Rein M.D.   On: 03/15/2015 16:44   Dg Abd 1 View  03/16/2015   CLINICAL DATA:  Small-bowel obstruction  EXAM: ABDOMEN - 1 VIEW  COMPARISON:  None.  FINDINGS: A nasogastric catheter was placed under fluoroscopic guidance. This was placed into the gastric lumen and confirmed fluoroscopically.  Radiation Exposure Index (as provided by the fluoroscopic  device): Not available  If the device does not provide the exposure index:  Fluoroscopy Time:  20 seconds  Number of Acquired Images:  1  IMPRESSION: Successful fluoroscopic placement of nasogastric catheter.   Electronically Signed   By: Alcide Clever M.D.   On: 03/16/2015 11:28   Dg Abd 2 Views  03/17/2015   CLINICAL DATA:  Upper lower epigastric pain  EXAM: ABDOMEN - 2 VIEW  COMPARISON:  CT abdomen 03/15/2015  FINDINGS: NG tube looped within the gastric fundus. Oral contrast from CT 03/15/2015 has progressed to the distal descending colon. No dilated loops of large or small bowel.  IMPRESSION: No evidence of bowel obstruction with progression of oral contrast to the right colon.   Electronically Signed   By: Loura Halt.D.  On: 03/17/2015 09:19   Dg Abd 2 Views  03/16/2015   CLINICAL DATA:  Small bowel obstruction.  EXAM: ABDOMEN - 2 VIEW  COMPARISON:  CT abdomen 03/15/2015  FINDINGS: There are abnormal dilated loop of bowel in the left lower abdomen with an air-fluid level most consistent with small-bowel obstruction. There is no bowel dilatation to suggest obstruction. There is no evidence of pneumoperitoneum, portal venous gas or pneumatosis. There are no pathologic calcifications along the expected course of the ureters.  The osseous structures are unremarkable.  IMPRESSION: Findings consistent with small bowel obstruction.   Electronically Signed   By: Elige Ko   On: 03/16/2015 09:04   Dg Abd Portable 1v  03/18/2015   CLINICAL DATA:  Follow-up small bowel obstruction.  EXAM: PORTABLE ABDOMEN - 1 VIEW  COMPARISON:  03/17/2015  FINDINGS: An NG tube is coiled in the mid stomach.  Oral contrast within the colon is again identified.  The bowel gas pattern is unchanged with a minimally distended loop of small bowel within the left central abdomen.  IMPRESSION: Unchanged bowel gas pattern.   Electronically Signed   By: Harmon Pier M.D.   On: 03/18/2015 09:42   Dg Vangie Bicker G Tube Plc W/fl-no  Rad  03/16/2015   CLINICAL DATA:    NASO G TUBE PLACEMENT WITH FLUORO  Fluoroscopy was utilized by the requesting physician.  No radiographic  interpretation.    Bonney Aid, MD 03/21/2015, 4:39 AM PGY-1, [place] [facility] FPTS Intern pager: [phone], text pages welcome

## 2015-03-22 ENCOUNTER — Encounter (HOSPITAL_COMMUNITY): Payer: Self-pay | Admitting: Vascular Surgery

## 2015-04-25 ENCOUNTER — Inpatient Hospital Stay (HOSPITAL_COMMUNITY): Admission: RE | Admit: 2015-04-25 | Payer: Commercial Managed Care - HMO | Source: Ambulatory Visit

## 2015-04-30 ENCOUNTER — Encounter: Payer: Self-pay | Admitting: Vascular Surgery

## 2015-05-01 ENCOUNTER — Encounter: Payer: Self-pay | Admitting: Vascular Surgery

## 2015-05-02 ENCOUNTER — Encounter: Payer: Commercial Managed Care - HMO | Admitting: Vascular Surgery

## 2015-06-01 ENCOUNTER — Ambulatory Visit: Payer: Commercial Managed Care - HMO | Admitting: Internal Medicine

## 2015-07-23 ENCOUNTER — Telehealth: Payer: Self-pay | Admitting: Hematology

## 2015-07-23 NOTE — Telephone Encounter (Signed)
New patient appt-s/w patient and gave np appt for 08/17 @ 9 w/Dr. Candise Che Referrign Dr. Casimiro Needle Dx- M-spike   Referral information scanned

## 2015-07-25 ENCOUNTER — Other Ambulatory Visit: Payer: Medicare PPO

## 2015-07-25 ENCOUNTER — Encounter: Payer: Medicare PPO | Admitting: Hematology

## 2015-07-25 ENCOUNTER — Ambulatory Visit: Payer: Medicare PPO

## 2015-07-25 NOTE — Progress Notes (Signed)
This encounter was created in error - please disregard.

## 2015-12-25 ENCOUNTER — Telehealth: Payer: Self-pay | Admitting: Oncology

## 2015-12-25 NOTE — Telephone Encounter (Signed)
PT CONFIRMED APPT FOR NEW PT REFERRAL. MAILED NEW PT PACKET

## 2016-01-02 ENCOUNTER — Telehealth: Payer: Self-pay | Admitting: *Deleted

## 2016-01-02 NOTE — Telephone Encounter (Signed)
Spoke with patient reminding her of her appointment at [facility] tomorrow. Instructed patient to be here at 1:30 pm. Patient verbalized understanding.

## 2016-01-03 ENCOUNTER — Ambulatory Visit (HOSPITAL_BASED_OUTPATIENT_CLINIC_OR_DEPARTMENT_OTHER): Payer: 59 | Admitting: Oncology

## 2016-01-03 ENCOUNTER — Telehealth: Payer: Self-pay | Admitting: Oncology

## 2016-01-03 VITALS — BP 179/73 | HR 70 | Temp 98.9°F | Resp 18 | Wt 167.6 lb

## 2016-01-03 DIAGNOSIS — N179 Acute kidney failure, unspecified: Secondary | ICD-10-CM | POA: Diagnosis not present

## 2016-01-03 DIAGNOSIS — D729 Disorder of white blood cells, unspecified: Secondary | ICD-10-CM

## 2016-01-03 NOTE — Progress Notes (Signed)
Please see consult note.  

## 2016-01-03 NOTE — Consult Note (Signed)
Reason for Referral: Evaluation for a plasma cell disorder.   HPI: 82 year old woman currently of [place] where she lives independently. He does have history of hypertension and long standing history of renal insufficiency dating back to at least 2010. Her renal failure have a progressive and had a fistula placed in preparation for possible dialysis in the future. She had laboratory testing done at [facility] on 12/20/2015. At that time her creatinine was 5.72 with normal electrolytes. Her albumin was normal at 4.6. Her hemoglobin was 10.6 with the normal white cell count and platelet count. Free kappa chains were elevated at 146 and free lambda light chain of 60. The both elevated with a ratio of 2.4 to slightly elevated with the upper limit of normal 1.65. Immunofixation failed to detect a monoclonal spike on SPEP or UPEP. I was asked to comment on these findings the possibility of a plasma cell disorder. Clinically, she reports few complaints today been chronic in nature. She has hemorrhoids that have been problematic for her for few months. She had intermittent back pain periodically which have not changed. She has not reported any falls or syncope. Has not reported any pathological fractures. She had multiple imaging studies including CT scan and plain film x-rays in the last year without any evidence of documented lytic bone lesions. She continues to live independently and attends to activities of daily living.  She does not report any headaches, blurry vision, seizures. She does not report any fevers, chills, sweats or weight loss. Her appetite is reasonable. She does not report any chest pain, palpitation, orthopnea or leg edema. She does not report any cough, wheezing or hemoptysis. Does not report any nausea, vomiting or abdominal pain. She does report occasional hematochezia and painful defecation. Does not report any frequency urgency or hesitancy. She does not report any  lymphadenopathy or petechiae. Remaining review of systems unremarkable.   Past Medical History  Diagnosis Date  . Hypertension   . CKD (chronic kidney disease) stage 4, GFR 15-29 ml/min   . ETOHism     Quit in the 1980s.  Marland Kitchen GERD (gastroesophageal reflux disease)   . Arthritis   . Atrial flutter   . Chronic renal failure   . Hemorrhoids   . Dementia   . Gastric ulcer 12/2013.    Prepyloric ulcer. On follow-up EGD in 02/2014 this was 90% healed  . Nephrolithiasis   . Pancreatic cyst 09/2010.    At uncinate process.  Serial CT imaging favors benign process  :  Past Surgical History  Procedure Laterality Date  . Esophagogastroduodenoscopy N/A 12/29/2013    Procedure: ESOPHAGOGASTRODUODENOSCOPY (EGD);  Surgeon: Lafayette Dragon, MD;  Location[facility] ENDOSCOPY;  Service: Endoscopy;  Laterality: N/A;  . Colonoscopy  2010    Per Dr. Collene Mares  . Flexible sigmoidoscopy  08/2011    Per Dr. Oletta Lamas. Normal study. Random biopsies positive for trace melanosis coli.  . Surgical pinning  1996    Foot  . Total abdominal hysterectomy w/ bilateral salpingoophorectomy  1980  . Exploratory laparotomy with lysis of adhesions  1980  . Av fistula placement Left 03/20/2015    Procedure: ARTERIOVENOUS (AV) FISTULA CREATION;  Surgeon: Angelia Mould, MD;  Location: [facility] OR;  Service: Vascular;  Laterality: Left;  :   Current outpatient prescriptions:  .  acetaminophen (TYLENOL) 500 MG tablet, Take 1,000 mg by mouth every 6 (six) hours as needed for mild pain., Disp: , Rfl:  .  ALPRAZolam (XANAX) 0.5 MG tablet, ,  Disp: , Rfl:  .  amLODipine (NORVASC) 10 MG tablet, Take 10 mg by mouth daily. , Disp: , Rfl:  .  metoprolol tartrate (LOPRESSOR) 25 MG tablet, Take 1 tablet (25 mg total) by mouth 2 (two) times daily., Disp: 60 tablet, Rfl: 0 .  nitroGLYCERIN (NITROSTAT) 0.4 MG SL tablet, Place 0.4 mg under the tongue every 5 (five) minutes as needed for chest pain., Disp: , Rfl:  .  pantoprazole (PROTONIX) 40 MG  tablet, Take 1 tablet (40 mg total) by mouth daily at 6 (six) AM. (Patient not taking: Reported on 03/15/2015), Disp: 30 tablet, Rfl: 0 .  pantoprazole (PROTONIX) 40 MG tablet, Take 1 tablet (40 mg total) by mouth daily., Disp: 30 tablet, Rfl: 3 .  solifenacin (VESICARE) 5 MG tablet, Take 5 mg by mouth daily., Disp: , Rfl:  .  Tetrahydrozoline HCl (VISINE OP), Place 2 drops into both eyes daily as needed (dry eyes)., Disp: , Rfl:  .  traMADol (ULTRAM) 50 MG tablet, , Disp: , Rfl: :  Allergies  Allergen Reactions  . Aspirin Other (See Comments)    "makes my stomach hurt"  . Codeine Other (See Comments)    "i get irritated"  :  Family History  Problem Relation Age of Onset  . Alcohol abuse Mother   :  Social History   Social History  . Marital Status: Married    Spouse Name: N/A  . Number of Children: N/A  . Years of Education: N/A   Occupational History  . Not on file.   Social History Main Topics  . Smoking status: Former Smoker    Quit date: 02/14/1986  . Smokeless tobacco: Never Used  . Alcohol Use: No     Comment: former drinker  . Drug Use: No  . Sexual Activity: Not on file   Other Topics Concern  . Not on file   Social History Narrative  :  Pertinent items are noted in HPI.  Exam: Blood pressure 179/73, pulse 70, temperature 98.9 F (37.2 C), temperature source Oral, resp. rate 18, weight 167 lb 9.6 oz (76.023 kg), SpO2 100 %. General appearance: alert and cooperative Head: Normocephalic, without obvious abnormality Throat: lips, mucosa, and tongue normal; teeth and gums normal Neck: no adenopathy Back: negative Resp: clear to auscultation bilaterally Chest wall: no tenderness Cardio: regular rate and rhythm, S1, S2 normal, no murmur, click, rub or gallop GI: soft, non-tender; bowel sounds normal; no masses,  no organomegaly Extremities: extremities normal, atraumatic, no cyanosis or edema Pulses: 2+ and symmetric Skin: Skin color, texture, turgor  normal. No rashes or lesions Lymph nodes: Cervical, supraclavicular, and axillary nodes normal.  CBC    Component Value Date/Time   WBC 8.6 03/21/2015 0444   RBC 3.64* 03/21/2015 0444   HGB 10.4* 03/21/2015 0444   HCT 32.5* 03/21/2015 0444   PLT 204 03/21/2015 0444   MCV 89.3 03/21/2015 0444   MCH 28.6 03/21/2015 0444   MCHC 32.0 03/21/2015 0444   RDW 14.0 03/21/2015 0444   LYMPHSABS 1.7 03/15/2015 1216   MONOABS 0.6 03/15/2015 1216   EOSABS 0.0 03/15/2015 1216   BASOSABS 0.0 03/15/2015 1216      Chemistry      Component Value Date/Time   NA 140 03/21/2015 0444   K 4.7 03/21/2015 0444   CL 111 03/21/2015 0444   CO2 19 03/21/2015 0444   BUN 40* 03/21/2015 0444   CREATININE 5.58* 03/21/2015 0444      Component Value Date/Time  CALCIUM 8.8 03/21/2015 0444   ALKPHOS 81 03/15/2015 1216   AST 25 03/15/2015 1216   ALT 13 03/15/2015 1216   BILITOT 0.5 03/15/2015 1216        Assessment and Plan:    82 year old woman with the following issues:  1. Evaluation for plasma cell disorder after presenting with elevated light chain ratio of 2.42. The upper limit of normal is 1.65 and this ratio have fluctuated of close to the last year. She does not have any urine or serum monoclonal protein detected by immunofixation.  The differential diagnosis was discussed with the patient today. These findings are likely are reactive given her renal failure and less likely suggestion of a plasma cell disorder. Other conditions such as MGUS, smoldering multiple myeloma, amyloidosis and multiple myeloma are less likely. I see no evidence of end organ damage as her renal failure is unlikely to be caused by plasma cell disorder.  For completeness, I will obtain a skeletal survey to rule out lytic bone lesions. I will repeat serum protein electrophoresis and immunofixation as well as serum light chains in 6 months. I see no need for a bone marrow biopsy at this time.  2. Renal failure: I doubt  this is related to a plasma cell disorder. She follows up with nephrology with possible dialysis in the future.  2. Follow-up: Will be in 6 months sooner if her workup revealed evidence to suggest active myeloma.

## 2016-01-03 NOTE — Telephone Encounter (Signed)
per pof to sch pt appt-gave pt copy of avs °

## 2016-01-24 ENCOUNTER — Ambulatory Visit (HOSPITAL_COMMUNITY): Payer: 59

## 2016-06-21 ENCOUNTER — Emergency Department (HOSPITAL_COMMUNITY): Payer: Medicare Other

## 2016-06-21 ENCOUNTER — Emergency Department (HOSPITAL_COMMUNITY)
Admission: EM | Admit: 2016-06-21 | Discharge: 2016-06-21 | Disposition: A | Discharge status: Home / Self Care | Payer: Medicare Other | Attending: Emergency Medicine | Admitting: Emergency Medicine

## 2016-06-21 ENCOUNTER — Encounter (HOSPITAL_COMMUNITY): Payer: Self-pay

## 2016-06-21 ENCOUNTER — Ambulatory Visit (HOSPITAL_COMMUNITY): Payer: 59

## 2016-06-21 DIAGNOSIS — S92901A Unspecified fracture of right foot, initial encounter for closed fracture: Secondary | ICD-10-CM | POA: Insufficient documentation

## 2016-06-21 DIAGNOSIS — Z87891 Personal history of nicotine dependence: Secondary | ICD-10-CM | POA: Insufficient documentation

## 2016-06-21 DIAGNOSIS — S99921A Unspecified injury of right foot, initial encounter: Secondary | ICD-10-CM | POA: Diagnosis present

## 2016-06-21 DIAGNOSIS — I129 Hypertensive chronic kidney disease with stage 1 through stage 4 chronic kidney disease, or unspecified chronic kidney disease: Secondary | ICD-10-CM | POA: Insufficient documentation

## 2016-06-21 DIAGNOSIS — Y939 Activity, unspecified: Secondary | ICD-10-CM | POA: Insufficient documentation

## 2016-06-21 DIAGNOSIS — Y999 Unspecified external cause status: Secondary | ICD-10-CM | POA: Insufficient documentation

## 2016-06-21 DIAGNOSIS — M1990 Unspecified osteoarthritis, unspecified site: Secondary | ICD-10-CM | POA: Insufficient documentation

## 2016-06-21 DIAGNOSIS — W1849XA Other slipping, tripping and stumbling without falling, initial encounter: Secondary | ICD-10-CM | POA: Insufficient documentation

## 2016-06-21 DIAGNOSIS — Y92009 Unspecified place in unspecified non-institutional (private) residence as the place of occurrence of the external cause: Secondary | ICD-10-CM | POA: Diagnosis not present

## 2016-06-21 DIAGNOSIS — Z79899 Other long term (current) drug therapy: Secondary | ICD-10-CM | POA: Diagnosis not present

## 2016-06-21 DIAGNOSIS — N184 Chronic kidney disease, stage 4 (severe): Secondary | ICD-10-CM | POA: Diagnosis not present

## 2016-06-21 MED ORDER — HYDROCODONE-ACETAMINOPHEN 5-325 MG PO TABS
1.0000 | ORAL_TABLET | Freq: Once | ORAL | Status: AC
Start: 2016-06-21 — End: 2016-06-21
  Administered 2016-06-21: 1 via ORAL
  Filled 2016-06-21: qty 1

## 2016-06-21 MED ORDER — METHOCARBAMOL 500 MG PO TABS: 500.0000 mg | ORAL_TABLET | Freq: Every evening | ORAL | Status: DC | PRN

## 2016-06-21 MED ORDER — HYDROCODONE-ACETAMINOPHEN 5-325 MG PO TABS: 2.0000 | ORAL_TABLET | ORAL | Status: DC | PRN

## 2016-06-21 NOTE — ED Notes (Signed)
[facility] notified for transport 

## 2016-06-21 NOTE — Discharge Instructions (Signed)
Minimize your weightbearing on your right foot.  Walk only using the rigid bottom shoe.

## 2016-06-21 NOTE — ED Notes (Signed)
Per EMS, pt from home.  Yesterday pt called MD b/c black stool x 2 days.  No vomiting or abdominal pain.  Pt was going to drive to hospital but did not b/c it was too hot. Went to neighbors house and tripped Scientist, water qualityoff porch.  Rt ankle swelling.  Difficulty ambulating.  Family called for wellness check. EMS transport at that time.  Vitals:  172/92, hr 70, resp 16

## 2016-06-21 NOTE — ED Provider Notes (Addendum)
CSN: [id number]     Arrival date & time 06/21/16  1014 History   First MD Initiated Contact with Patient 06/21/16 1049     Chief Complaint  Patient presents with  . Ankle Pain  . Melena     HPI  Monica [name] presents for evaluation of 2 distinct different complaints. One is Monica [name] had some diarrhea yesterday and drank an entire bottle of Pepto-Bismol had 2 black stools this morning. Also states that Monica [name] fell from her neighbor's porch yesterday. Actually did not fall to ground stubbed her foot and tripped. Has some pain in her right foot walks with a limp but is able to walk.  Past Medical History  Diagnosis Date  . Hypertension   . CKD (chronic kidney disease) stage 4, GFR 15-29 ml/min (HCC)   . ETOHism (HCC)     Quit in the 1980s.  Marland Kitchen GERD (gastroesophageal reflux disease)   . Arthritis   . Atrial flutter (HCC)   . Chronic renal failure   . Hemorrhoids   . Dementia   . Gastric ulcer 12/2013.    Prepyloric ulcer. On follow-up EGD in 02/2014 this was 90% healed  . Nephrolithiasis   . Pancreatic cyst 09/2010.    At uncinate process.  Serial CT imaging favors benign process   Past Surgical History  Procedure Laterality Date  . Esophagogastroduodenoscopy N/A 12/29/2013    Procedure: ESOPHAGOGASTRODUODENOSCOPY (EGD);  Surgeon: Hart Carwin, MD;  Location[facility] ENDOSCOPY;  Service: Endoscopy;  Laterality: N/A;  . Colonoscopy  2010    Per Dr. Loreta Ave  . Flexible sigmoidoscopy  08/2011    Per Dr. Randa Evens. Normal study. Random biopsies positive for trace melanosis coli.  . Surgical pinning  1996    Foot  . Total abdominal hysterectomy w/ bilateral salpingoophorectomy  1980  . Exploratory laparotomy with lysis of adhesions  1980  . Av fistula placement Left 03/20/2015    Procedure: ARTERIOVENOUS (AV) FISTULA CREATION;  Surgeon: Chuck Hint, MD;  Location: [facility] OR;  Service: Vascular;  Laterality: Left;   Family History  Problem Relation Age of Onset  . Alcohol abuse Mother    Social History   Substance Use Topics  . Smoking status: Former Smoker    Quit date: 02/14/1986  . Smokeless tobacco: Never Used  . Alcohol Use: No     Comment: former drinker   OB History    No data available     Review of Systems  Constitutional: Negative for fever, chills, diaphoresis, appetite change and fatigue.  HENT: Negative for mouth sores, sore throat and trouble swallowing.   Eyes: Negative for visual disturbance.  Respiratory: Negative for cough, chest tightness, shortness of breath and wheezing.   Cardiovascular: Negative for chest pain.  Gastrointestinal: Negative for nausea, vomiting, abdominal pain, diarrhea and abdominal distention.       Black stools  Endocrine: Negative for polydipsia, polyphagia and polyuria.  Genitourinary: Negative for dysuria, frequency and hematuria.  Musculoskeletal: Negative for gait problem.       Right foot pain  Skin: Negative for color change, pallor and rash.  Neurological: Negative for dizziness, syncope, light-headedness and headaches.  Hematological: Does not bruise/bleed easily.  Psychiatric/Behavioral: Negative for behavioral problems and confusion.      Allergies  Aspirin and Codeine  Home Medications   Prior to Admission medications   Medication Sig Start Date End Date Taking? Authorizing Provider  acetaminophen (TYLENOL) 500 MG tablet Take 1,000 mg by mouth every 6 (six) hours as needed  for mild pain.    Historical Provider, MD  ALPRAZolam Prudy Feeler) 0.5 MG tablet  01/13/14   Historical Provider, MD  amLODipine (NORVASC) 10 MG tablet Take 10 mg by mouth daily.  01/13/14   Historical Provider, MD  HYDROcodone-acetaminophen (NORCO/VICODIN) 5-325 MG tablet Take 2 tablets by mouth every 4 (four) hours as needed. 06/21/16   Rolland Porter, MD  methocarbamol (ROBAXIN) 500 MG tablet Take 1 tablet (500 mg total) by mouth at bedtime as needed for muscle spasms. 06/21/16   Rolland Porter, MD  metoprolol tartrate (LOPRESSOR) 25 MG tablet Take 1 tablet (25 mg  total) by mouth 2 (two) times daily. 12/31/13   Clydia Llano, MD  nitroGLYCERIN (NITROSTAT) 0.4 MG SL tablet Place 0.4 mg under the tongue every 5 (five) minutes as needed for chest pain.    Historical Provider, MD  pantoprazole (PROTONIX) 40 MG tablet Take 1 tablet (40 mg total) by mouth daily at 6 (six) AM. Patient not taking: Reported on 03/15/2015 12/31/13   Clydia Llano, MD  pantoprazole (PROTONIX) 40 MG tablet Take 1 tablet (40 mg total) by mouth daily. 03/21/15   Alyssa A Kennon Rounds, MD  solifenacin (VESICARE) 5 MG tablet Take 5 mg by mouth daily.    Historical Provider, MD  Tetrahydrozoline HCl (VISINE OP) Place 2 drops into both eyes daily as needed (dry eyes).    Historical Provider, MD  traMADol (ULTRAM) 50 MG tablet  02/17/14   Historical Provider, MD   BP 176/65 mmHg  Pulse 79  Temp(Src) 98.3 F (36.8 C) (Oral)  Resp 18  SpO2 100% Physical Exam  Constitutional: Monica [name] is oriented to person, place, and time. Monica [name] appears well-developed and well-nourished. No distress.  HENT:  Head: Normocephalic.  Eyes: Conjunctivae are normal. Pupils are equal, round, and reactive to light. No scleral icterus.  Neck: Normal range of motion. Neck supple. No thyromegaly present.  Cardiovascular: Normal rate and regular rhythm.  Exam reveals no gallop and no friction rub.   No murmur heard. Pulmonary/Chest: Effort normal and breath sounds normal. No respiratory distress. Monica [name] has no wheezes. Monica [name] has no rales.  Abdominal: Soft. Bowel sounds are normal. Monica [name] exhibits no distension. There is no tenderness. There is no rebound.  Musculoskeletal: Normal range of motion.       Feet:  Neurological: Monica [name] is alert and oriented to person, place, and time.  Skin: Skin is warm and dry. No rash noted.  Psychiatric: Monica [name] has a normal mood and affect. Her behavior is normal.    ED Course  Procedures (including critical care time) Labs Review Labs Reviewed - No data to display  Imaging Review Dg Ankle Complete  Right  06/21/2016  CLINICAL DATA:  Acute right ankle swelling after fall. EXAM: RIGHT ANKLE - COMPLETE 3+ VIEW COMPARISON:  Radiographs of July 18, 2011. FINDINGS: Status post surgical internal fixation of distal tibial and fibular fractures. No acute fracture or dislocation is noted. Joint spaces intact. No soft tissue abnormality is noted. IMPRESSION: Status post surgical fixation of old distal tibial and fibular fractures. No acute abnormality seen in the right ankle. Electronically Signed   By: Lupita Raider, M.D.   On: 06/21/2016 13:12   Dg Foot Complete Left  06/21/2016  CLINICAL DATA:  Left foot pain after fall. EXAM: LEFT FOOT - COMPLETE 3+ VIEW COMPARISON:  None. FINDINGS: Status post surgical fusion of the talus and calcaneus. Diffuse osteopenia is noted. No acute fracture or dislocation is noted. No soft tissue abnormality is noted.  IMPRESSION: Postsurgical changes as described above. No acute abnormality seen in the left foot. Electronically Signed   By: Lupita RaiderJames  Green Jr, M.D.   On: 06/21/2016 13:18   Dg Foot Complete Right  06/21/2016  CLINICAL DATA:  Acute right foot pain after fall. EXAM: RIGHT FOOT COMPLETE - 3+ VIEW COMPARISON:  None. FINDINGS: Status post surgical fusion and fixation of the talus and calcaneus. Diffuse osteopenia is noted. There appears to be a nondisplaced fracture involving the distal fifth metatarsal. This appears to be closed and posttraumatic. IMPRESSION: Probable nondisplaced distal fifth metatarsal fracture. Electronically Signed   By: Lupita RaiderJames  Green Jr, M.D.   On: 06/21/2016 13:15   I have personally reviewed and evaluated these images and lab results as part of my medical decision-making.   EKG Interpretation None      MDM   Final diagnoses:  Foot fracture, right, closed, initial encounter    Stool is guaiac negative. I think her black stools are secondary to her pepto bismol. X-ray show a distal nondisplaced fifth metatarsal fracture. Fitted with a  postop boot. States Monica [name] gets leg cramps at night. Discharge prescription for limited number of Vicodin for pain. Robaxin for nighttime leg cramps. Repeat follow-up. Minimize weightbearing. Weight-bear as tolerated with metatarsal shoe only.    Rolland PorterMark Huber Mathers, MD 06/21/16 1410  Rolland PorterMark Maritssa Haughton, MD 11/22/16 [phone]

## 2016-06-21 NOTE — ED Notes (Signed)
Bed: [id number] Expected date:  Expected time:  Means of arrival:  Comments: Ankle injury, black stools

## 2016-07-02 ENCOUNTER — Other Ambulatory Visit: Payer: 59

## 2016-07-09 ENCOUNTER — Ambulatory Visit: Payer: 59 | Admitting: Oncology

## 2016-07-17 ENCOUNTER — Other Ambulatory Visit: Payer: Self-pay | Admitting: Family Medicine

## 2016-07-17 ENCOUNTER — Ambulatory Visit
Admission: RE | Admit: 2016-07-17 | Discharge: 2016-07-17 | Disposition: A | Discharge status: Home / Self Care | Payer: Medicare Other | Source: Ambulatory Visit | Attending: Family Medicine | Admitting: Family Medicine

## 2016-07-17 DIAGNOSIS — R52 Pain, unspecified: Secondary | ICD-10-CM

## 2016-08-04 ENCOUNTER — Ambulatory Visit (HOSPITAL_COMMUNITY)
Admission: EM | Admit: 2016-08-04 | Discharge: 2016-08-04 | Disposition: A | Discharge status: Home / Self Care | Payer: Medicare Other | Attending: Internal Medicine | Admitting: Internal Medicine

## 2016-08-04 ENCOUNTER — Ambulatory Visit (INDEPENDENT_AMBULATORY_CARE_PROVIDER_SITE_OTHER): Payer: Medicare Other

## 2016-08-04 ENCOUNTER — Encounter (HOSPITAL_COMMUNITY): Payer: Self-pay | Admitting: Emergency Medicine

## 2016-08-04 DIAGNOSIS — A09 Infectious gastroenteritis and colitis, unspecified: Secondary | ICD-10-CM

## 2016-08-04 DIAGNOSIS — K529 Noninfective gastroenteritis and colitis, unspecified: Secondary | ICD-10-CM

## 2016-08-04 NOTE — Discharge Instructions (Signed)
Abdominal discomfort seems most likely due to resolving gastroenteritis (stomach bug).  Might try increasing roughage in diet to increase bowel movement frequency to greater than once weekly.  Recheck or followup with primary care provider, Monica [name], for further evaluation if abdominal discomfort persists or worsens.

## 2016-08-04 NOTE — ED Notes (Signed)
The pt is unable to void at this time, she is drinking water and aware that we need sample for UA.

## 2016-08-04 NOTE — ED Triage Notes (Signed)
[facility] with a complaint of abdominal pain that started 5 days ago. The patient reported a day of diarrhea 4 days ago and stated that she has had no bowel movements since. The patient also reported lower back pain that has started over the last 2 days.

## 2016-08-04 NOTE — ED Provider Notes (Signed)
[facility]    CSN: [id number] Arrival date & time: 08/04/16  1055  First Provider Contact:  First MD Initiated Contact with Patient 08/04/16 1149        History   Chief Complaint Chief Complaint  Patient presents with  . Abdominal Pain    HPI Monica [name] is a 82 y.o. female. She presents today with intermittent vague abdominal discomfort, present for weeks. Somewhat worse last week, after eating a hamburger that tasted "off." Had some diarrhea Friday, took some Imodium and was feeling better over the weekend. Has had no more bowel movements since Friday, and abdominal pain is improving. Episodes of abdominal discomfort are described as aching, and last for a few minutes. These occur about once daily. In general, her abdominal discomfort is not related to eating, or time of day, or activity. She has bowel movements about once a week, describes bowel movements as soft, and not large. She does not have pain with bowel movements. No dysuria. She does have some chronic incontinence and wears a pad. This is not worse than usual. No fever, no shortness of breath. Not coughing.   HPI  Past Medical History:  Diagnosis Date  . Arthritis   . Atrial flutter (HCC)   . Chronic renal failure   . CKD (chronic kidney disease) stage 4, GFR 15-29 ml/min (HCC)   . Dementia   . ETOHism (HCC)    Quit in the 1980s.  . Gastric ulcer 12/2013.   Prepyloric ulcer. On follow-up EGD in 02/2014 this was 90% healed  . GERD (gastroesophageal reflux disease)   . Hemorrhoids   . Hypertension   . Nephrolithiasis   . Pancreatic cyst 09/2010.   At uncinate process.  Serial CT imaging favors benign process    Patient Active Problem List   Diagnosis Date Noted  . AKI (acute kidney injury) (HCC)   . Essential hypertension   . Chronic kidney disease (CKD), stage IV (severe) (HCC)   . Pancreatic cyst   . Hematuria   . Encounter for nasogastric (NG) tube placement   . SBO (small bowel  obstruction) (HCC)   . Small bowel obstruction (HCC) 03/15/2015  . Abscess of buttock 12/29/2013  . HTN (hypertension) 12/29/2013  . PAF (paroxysmal atrial fibrillation) (HCC) 12/29/2013  . Melena 12/29/2013  . CKD (chronic kidney disease), stage IV (HCC) 12/29/2013  . Leukocytosis 12/29/2013  . Escherichia coli urinary tract infection 12/29/2013  . Gastric ulcer with hemorrhage 12/29/2013  . Upper GI bleed 12/28/2013    Past Surgical History:  Procedure Laterality Date  . AV FISTULA PLACEMENT Left 03/20/2015   Procedure: ARTERIOVENOUS (AV) FISTULA CREATION;  Surgeon: Chuck Hint, MD;  [facility];  Service: Vascular;  Laterality: Left;  . COLONOSCOPY  2010   Per Dr. Loreta Ave  . ESOPHAGOGASTRODUODENOSCOPY N/A 12/29/2013   Procedure: ESOPHAGOGASTRODUODENOSCOPY (EGD);  Surgeon: Hart Carwin, MD;  Location: [facility]COPY;  Service: Endoscopy;  Laterality: N/A;  . Exploratory laparotomy with lysis of adhesions  1980  . FLEXIBLE SIGMOIDOSCOPY  08/2011   Per Dr. Randa Evens. Normal study. Random biopsies positive for trace melanosis coli.  . Surgical pinning  1996   Foot  . TOTAL ABDOMINAL HYSTERECTOMY W/ BILATERAL SALPINGOOPHORECTOMY  1980     Home Medications    Prior to Admission medications   Medication Sig Start Date End Date Taking? Authorizing Provider  ALPRAZolam Prudy Feeler) 0.5 MG tablet  01/13/14  Yes Historical Provider, MD  amLODipine (NORVASC) 10 MG tablet  Take 10 mg by mouth daily.  01/13/14  Yes Historical Provider, MD  HYDROcodone-acetaminophen (NORCO/VICODIN) 5-325 MG tablet Take 2 tablets by mouth every 4 (four) hours as needed. 06/21/16  Yes Rolland Porter, MD  methocarbamol (ROBAXIN) 500 MG tablet Take 1 tablet (500 mg total) by mouth at bedtime as needed for muscle spasms. 06/21/16  Yes Rolland Porter, MD  metoprolol tartrate (LOPRESSOR) 25 MG tablet Take 1 tablet (25 mg total) by mouth 2 (two) times daily. 12/31/13  Yes Clydia Llano, MD  solifenacin (VESICARE) 5 MG tablet Take  5 mg by mouth daily.   Yes Historical Provider, MD  Tetrahydrozoline HCl (VISINE OP) Place 2 drops into both eyes daily as needed (dry eyes).   Yes Historical Provider, MD  acetaminophen (TYLENOL) 500 MG tablet Take 1,000 mg by mouth every 6 (six) hours as needed for mild pain.    Historical Provider, MD  nitroGLYCERIN (NITROSTAT) 0.4 MG SL tablet Place 0.4 mg under the tongue every 5 (five) minutes as needed for chest pain.    Historical Provider, MD  pantoprazole (PROTONIX) 40 MG tablet Take 1 tablet (40 mg total) by mouth daily at 6 (six) AM. Patient not taking: Reported on 03/15/2015 12/31/13   Clydia Llano, MD  pantoprazole (PROTONIX) 40 MG tablet Take 1 tablet (40 mg total) by mouth daily. 03/21/15   Bonney Aid, MD  traMADol (ULTRAM) 50 MG tablet  02/17/14   Historical Provider, MD    Family History Family History  Problem Relation Age of Onset  . Alcohol abuse Mother     Social History Social History  Substance Use Topics  . Smoking status: Former Smoker    Quit date: 02/14/1986  . Smokeless tobacco: Never Used  . Alcohol use No     Comment: former drinker     Allergies   Aspirin and Codeine   Review of Systems Review of Systems  All other systems reviewed and are negative.    Physical Exam Triage Vital Signs ED Triage Vitals  Enc Vitals Group     BP 08/04/16 1142 96/62     Pulse Rate 08/04/16 1142 (!) 52     Resp 08/04/16 1142 18     Temp 08/04/16 1142 98.1 F (36.7 C)     Temp Source 08/04/16 1142 Oral     SpO2 08/04/16 1142 99 %     Weight --      Height --      Pain Score 08/04/16 1147 8   Updated Vital Signs BP 96/62 (BP Location: Right Arm)   Pulse (!) 52   Temp 98.1 F (36.7 C) (Oral)   Resp 18   SpO2 99%  Physical Exam  Constitutional: She is oriented to person, place, and time. No distress.  Alert, nicely groomed Sitting up in wheelchair  HENT:  Head: Atraumatic.  Eyes:  Conjugate gaze, no eye redness/drainage  Neck: Neck supple.    Cardiovascular: Normal rate and regular rhythm.   Pulmonary/Chest: No respiratory distress. She has no wheezes. She has no rales.  Lungs clear, symmetric breath sounds  Abdominal: Soft. She exhibits no mass. There is no tenderness. There is no guarding.  May be very slightly distended, versus protuberant  Musculoskeletal: Normal range of motion.  Symmetric 1-2+ puffy edema bilaterally  Neurological: She is alert and oriented to person, place, and time.  Skin: Skin is warm and dry.  No cyanosis  Nursing note and vitals reviewed.    UC Treatments / Results  Procedures Procedures (including critical care time)      none  Final Clinical Impressions(s) / UC Diagnoses   Final diagnoses:  Gastroenteritis presumed infectious  --resolving Recheck or followup with PCP/Wayland McKenzie for further evaluation as needed.      Eustace MooreLaura W Shmuel Girgis, MD 08/17/16 [phone]

## 2016-08-13 ENCOUNTER — Encounter (HOSPITAL_COMMUNITY): Payer: Self-pay

## 2016-08-13 ENCOUNTER — Emergency Department (HOSPITAL_COMMUNITY): Payer: Medicare Other

## 2016-08-13 ENCOUNTER — Emergency Department (HOSPITAL_COMMUNITY)
Admission: EM | Admit: 2016-08-13 | Discharge: 2016-08-13 | Disposition: A | Discharge status: Home / Self Care | Payer: Medicare Other | Attending: Emergency Medicine | Admitting: Emergency Medicine

## 2016-08-13 DIAGNOSIS — Z87891 Personal history of nicotine dependence: Secondary | ICD-10-CM | POA: Insufficient documentation

## 2016-08-13 DIAGNOSIS — I129 Hypertensive chronic kidney disease with stage 1 through stage 4 chronic kidney disease, or unspecified chronic kidney disease: Secondary | ICD-10-CM | POA: Diagnosis not present

## 2016-08-13 DIAGNOSIS — N390 Urinary tract infection, site not specified: Secondary | ICD-10-CM | POA: Diagnosis not present

## 2016-08-13 DIAGNOSIS — N184 Chronic kidney disease, stage 4 (severe): Secondary | ICD-10-CM | POA: Diagnosis not present

## 2016-08-13 DIAGNOSIS — Z79899 Other long term (current) drug therapy: Secondary | ICD-10-CM | POA: Insufficient documentation

## 2016-08-13 DIAGNOSIS — R109 Unspecified abdominal pain: Secondary | ICD-10-CM | POA: Diagnosis present

## 2016-08-13 LAB — CBC WITH DIFFERENTIAL/PLATELET
BASOS ABS: 0 10*3/uL (ref 0.0–0.1)
BASOS PCT: 0 %
EOS ABS: 0 10*3/uL (ref 0.0–0.7)
EOS PCT: 0 %
HCT: 28.5 % — ABNORMAL LOW (ref 36.0–46.0)
Hemoglobin: 9.3 g/dL — ABNORMAL LOW (ref 12.0–15.0)
LYMPHS PCT: 8 %
Lymphs Abs: 1.1 10*3/uL (ref 0.7–4.0)
MCH: 27.4 pg (ref 26.0–34.0)
MCHC: 32.6 g/dL (ref 30.0–36.0)
MCV: 84.1 fL (ref 78.0–100.0)
Monocytes Absolute: 0.6 10*3/uL (ref 0.1–1.0)
Monocytes Relative: 5 %
Neutro Abs: 12 10*3/uL — ABNORMAL HIGH (ref 1.7–7.7)
Neutrophils Relative %: 87 %
PLATELETS: 292 10*3/uL (ref 150–400)
RBC: 3.39 MIL/uL — AB (ref 3.87–5.11)
RDW: 13.8 % (ref 11.5–15.5)
WBC: 13.8 10*3/uL — AB (ref 4.0–10.5)

## 2016-08-13 LAB — COMPREHENSIVE METABOLIC PANEL
ALT: 9 U/L — AB (ref 14–54)
AST: 17 U/L (ref 15–41)
Albumin: 4.5 g/dL (ref 3.5–5.0)
Alkaline Phosphatase: 58 U/L (ref 38–126)
Anion gap: 14 (ref 5–15)
BUN: 78 mg/dL — AB (ref 6–20)
CHLORIDE: 105 mmol/L (ref 101–111)
CO2: 19 mmol/L — AB (ref 22–32)
CREATININE: 8.38 mg/dL — AB (ref 0.44–1.00)
Calcium: 9.1 mg/dL (ref 8.9–10.3)
GFR calc Af Amer: 5 mL/min — ABNORMAL LOW (ref >60)
GFR calc non Af Amer: 4 mL/min — ABNORMAL LOW (ref >60)
Glucose, Bld: 109 mg/dL — ABNORMAL HIGH (ref 65–99)
Potassium: 4.1 mmol/L (ref 3.5–5.1)
SODIUM: 138 mmol/L (ref 135–145)
Total Bilirubin: 0.3 mg/dL (ref 0.3–1.2)
Total Protein: 8.7 g/dL — ABNORMAL HIGH (ref 6.5–8.1)

## 2016-08-13 LAB — URINALYSIS, ROUTINE W REFLEX MICROSCOPIC
Bilirubin Urine: NEGATIVE
GLUCOSE, UA: NEGATIVE mg/dL
Ketones, ur: NEGATIVE mg/dL
Nitrite: NEGATIVE
Protein, ur: 100 mg/dL — AB
SPECIFIC GRAVITY, URINE: 1.012 (ref 1.005–1.030)
pH: 6 (ref 5.0–8.0)

## 2016-08-13 LAB — URINE MICROSCOPIC-ADD ON

## 2016-08-13 LAB — LIPASE, BLOOD: Lipase: 85 U/L — ABNORMAL HIGH (ref 11–51)

## 2016-08-13 MED ORDER — CEPHALEXIN 500 MG PO CAPS
500.0000 mg | ORAL_CAPSULE | Freq: Once | ORAL | Status: AC
  Administered 2016-08-13: 500 mg via ORAL
  Filled 2016-08-13: qty 1

## 2016-08-13 MED ORDER — CEPHALEXIN 500 MG PO CAPS: 500.0000 mg | ORAL_CAPSULE | Freq: Two times a day (BID) | ORAL | 0 refills | Status: DC

## 2016-08-13 MED ORDER — ACETAMINOPHEN 500 MG PO TABS
1000.0000 mg | ORAL_TABLET | Freq: Once | ORAL | Status: AC
  Administered 2016-08-13: 1000 mg via ORAL
  Filled 2016-08-13: qty 2

## 2016-08-13 MED ORDER — IOPAMIDOL (ISOVUE-300) INJECTION 61%
15.0000 mL | Freq: Once | INTRAVENOUS | Status: DC | PRN
  Administered 2016-08-13: 15 mL via ORAL
  Filled 2016-08-13: qty 30

## 2016-08-13 NOTE — ED Triage Notes (Signed)
Per EMS, Pt, from home, c/o fatigue, slight abdominal pain, and intermittent urinary incontinence x 3 days.  Pain score 4/10.  Pt reports that she typically is ambulatory, but has been unable to get out of bed today.  Pt live alone.  Hx of dementia, chronic renal failure, and chronic kidney disease.

## 2016-08-13 NOTE — ED Provider Notes (Signed)
[facility] Provider Note   CSN: [id number] Arrival date & time: 08/13/16  1359     History   Chief Complaint Chief Complaint  Patient presents with  . Fatigue  . Abdominal Pain  . Urinary Incontinence    HPI Monica [name] is a 82 y.o. female. Past medical history of end-stage renal disease on hemodialysis, dementia, GERD, presenting today with persistent abdominal pain. Patient states this has been going on for the past 1 week. She has had multiple episodes of diarrhea as well. Her last diarrhea was this morning. Her pain is in the suprapubic area. There is no radiation. She was seen in urgent care on August 28 and treated for gastroenteritis without any significant relief. She denies any subjective fevers. She denies any urinary symptoms. There are no further complaints.  10 Systems reviewed and are negative for acute change except as noted in the HPI.    HPI  Past Medical History:  Diagnosis Date  . Arthritis   . Atrial flutter (HCC)   . Chronic renal failure   . CKD (chronic kidney disease) stage 4, GFR 15-29 ml/min (HCC)   . Dementia   . ETOHism (HCC)    Quit in the 1980s.  . Gastric ulcer 12/2013.   Prepyloric ulcer. On follow-up EGD in 02/2014 this was 90% healed  . GERD (gastroesophageal reflux disease)   . Hemorrhoids   . Hypertension   . Nephrolithiasis   . Pancreatic cyst 09/2010.   At uncinate process.  Serial CT imaging favors benign process    Patient Active Problem List   Diagnosis Date Noted  . AKI (acute kidney injury) (HCC)   . Essential hypertension   . Chronic kidney disease (CKD), stage IV (severe) (HCC)   . Pancreatic cyst   . Hematuria   . Encounter for nasogastric (NG) tube placement   . SBO (small bowel obstruction) (HCC)   . Small bowel obstruction (HCC) 03/15/2015  . Abscess of buttock 12/29/2013  . HTN (hypertension) 12/29/2013  . PAF (paroxysmal atrial fibrillation) (HCC) 12/29/2013  . Melena 12/29/2013  . CKD (chronic kidney  disease), stage IV (HCC) 12/29/2013  . Leukocytosis 12/29/2013  . Escherichia coli urinary tract infection 12/29/2013  . Gastric ulcer with hemorrhage 12/29/2013  . Upper GI bleed 12/28/2013    Past Surgical History:  Procedure Laterality Date  . AV FISTULA PLACEMENT Left 03/20/2015   Procedure: ARTERIOVENOUS (AV) FISTULA CREATION;  Surgeon: Chuck Hint, MD;  Location[facility];  Service: Vascular;  Laterality: Left;  . COLONOSCOPY  2010   Per Dr. Loreta Ave  . ESOPHAGOGASTRODUODENOSCOPY N/A 12/29/2013   Procedure: ESOPHAGOGASTRODUODENOSCOPY (EGD);  Surgeon: Hart Carwin, MD;  Location: [facility] ENDOSCOPY;  Service: Endoscopy;  Laterality: N/A;  . Exploratory laparotomy with lysis of adhesions  1980  . FLEXIBLE SIGMOIDOSCOPY  08/2011   Per Dr. Randa Evens. Normal study. Random biopsies positive for trace melanosis coli.  . Surgical pinning  1996   Foot  . TOTAL ABDOMINAL HYSTERECTOMY W/ BILATERAL SALPINGOOPHORECTOMY  1980    OB History    No data available       Home Medications    Prior to Admission medications   Medication Sig Start Date End Date Taking? Authorizing Provider  acetaminophen (TYLENOL) 500 MG tablet Take 1,000 mg by mouth every 6 (six) hours as needed for mild pain.   Yes Historical Provider, MD  ALPRAZolam Prudy Feeler) 0.5 MG tablet Take 0.5 mg by mouth 2 (two) times daily.  01/13/14  Yes  Historical Provider, MD  amLODipine (NORVASC) 10 MG tablet Take 10 mg by mouth daily.  01/13/14  Yes Historical Provider, MD  donepezil (ARICEPT) 10 MG tablet Take 10 mg by mouth at bedtime.   Yes Historical Provider, MD  furosemide (LASIX) 80 MG tablet Take 80 mg by mouth daily.   Yes Historical Provider, MD  HYDROcodone-acetaminophen (NORCO/VICODIN) 5-325 MG tablet Take 2 tablets by mouth every 4 (four) hours as needed. Patient taking differently: Take 1 tablet by mouth 2 (two) times daily as needed for moderate pain.  06/21/16  Yes Rolland Porter, MD  metoprolol tartrate (LOPRESSOR) 25 MG tablet  Take 1 tablet (25 mg total) by mouth 2 (two) times daily. 12/31/13  Yes Clydia Llano, MD  nitroGLYCERIN (NITROSTAT) 0.4 MG SL tablet Place 0.4 mg under the tongue every 5 (five) minutes as needed for chest pain.   Yes Historical Provider, MD  Tetrahydrozoline HCl (VISINE OP) Place 2 drops into both eyes daily as needed (dry eyes).   Yes Historical Provider, MD  methocarbamol (ROBAXIN) 500 MG tablet Take 1 tablet (500 mg total) by mouth at bedtime as needed for muscle spasms. Patient not taking: Reported on 08/13/2016 06/21/16   Rolland Porter, MD  pantoprazole (PROTONIX) 40 MG tablet Take 1 tablet (40 mg total) by mouth daily. Patient not taking: Reported on 08/13/2016 03/21/15   Bonney Aid, MD  solifenacin (VESICARE) 5 MG tablet Take 5 mg by mouth daily.    Historical Provider, MD    Family History Family History  Problem Relation Age of Onset  . Alcohol abuse Mother     Social History Social History  Substance Use Topics  . Smoking status: Former Smoker    Quit date: 02/14/1986  . Smokeless tobacco: Never Used  . Alcohol use No     Comment: former drinker     Allergies   Aspirin and Codeine   Review of Systems Review of Systems   Physical Exam Updated Vital Signs BP 134/70 (BP Location: Left Arm)   Pulse 90   Temp 100.6 F (38.1 C) (Oral)   Resp 16   Ht 5\' 1"  (1.549 m)   Wt 123 lb (55.8 kg)   SpO2 100%   BMI 23.24 kg/m   Physical Exam  Constitutional: She is oriented to person, place, and time. She appears well-developed and well-nourished. No distress.  HENT:  Head: Normocephalic and atraumatic.  Nose: Nose normal.  Mouth/Throat: Oropharynx is clear and moist. No oropharyngeal exudate.  Eyes: Conjunctivae and EOM are normal. Pupils are equal, round, and reactive to light. No scleral icterus.  Neck: Normal range of motion. Neck supple. No JVD present. No tracheal deviation present. No thyromegaly present.  Cardiovascular: Normal rate, regular rhythm and normal heart  sounds.  Exam reveals no gallop and no friction rub.   No murmur heard. Pulmonary/Chest: Effort normal and breath sounds normal. No respiratory distress. She has no wheezes. She exhibits no tenderness.  Abdominal: Soft. Bowel sounds are normal. She exhibits no distension and no mass. There is tenderness. There is no rebound and no guarding.  Suprapubic tenderness to palpation  Musculoskeletal: Normal range of motion. She exhibits edema. She exhibits no tenderness.  Left upper extremity AV fistula with palpable thrill  Lymphadenopathy:    She has no cervical adenopathy.  Neurological: She is alert and oriented to person, place, and time. No cranial nerve deficit. She exhibits normal muscle tone.  Skin: Skin is warm and dry. No rash noted. No erythema.  No pallor.  Tactile fever  Nursing note and vitals reviewed.    ED Treatments / Results  Labs (all labs ordered are listed, but only abnormal results are displayed) Labs Reviewed  CBC WITH DIFFERENTIAL/PLATELET - Abnormal; Notable for the following:       Result Value   WBC 13.8 (*)    RBC 3.39 (*)    Hemoglobin 9.3 (*)    HCT 28.5 (*)    Neutro Abs 12.0 (*)    All other components within normal limits  COMPREHENSIVE METABOLIC PANEL - Abnormal; Notable for the following:    CO2 19 (*)    Glucose, Bld 109 (*)    BUN 78 (*)    Creatinine, Ser 8.38 (*)    Total Protein 8.7 (*)    ALT 9 (*)    GFR calc non Af Amer 4 (*)    GFR calc Af Amer 5 (*)    All other components within normal limits  LIPASE, BLOOD - Abnormal; Notable for the following:    Lipase 85 (*)    All other components within normal limits  URINALYSIS, ROUTINE W REFLEX MICROSCOPIC (NOT AT [facility]) - Abnormal; Notable for the following:    APPearance CLOUDY (*)    Hgb urine dipstick LARGE (*)    Protein, ur 100 (*)    Leukocytes, UA MODERATE (*)    All other components within normal limits  URINE MICROSCOPIC-ADD ON - Abnormal; Notable for the following:    Squamous  Epithelial / LPF 0-5 (*)    Bacteria, UA RARE (*)    All other components within normal limits  URINE CULTURE    EKG  EKG Interpretation  Date/Time:  Wednesday August 13 2016 14:35:41 EDT Ventricular Rate:  81 PR Interval:    QRS Duration: 106 QT Interval:  376 QTC Calculation: 437 R Axis:   16 Text Interpretation:  Sinus rhythm Left axis deviation TWI in lateral leads No significant change since last tracing Confirmed by Erroll Luna [phone]) on 08/13/2016 3:05:56 PM       Radiology Ct Abdomen Pelvis Wo Contrast  Result Date: 08/13/2016 CLINICAL DATA:  Abdominal pain, fatigue and intermittent urinary incontinence. EXAM: CT ABDOMEN AND PELVIS WITHOUT CONTRAST TECHNIQUE: Multidetector CT imaging of the abdomen and pelvis was performed following the standard protocol without IV contrast. COMPARISON:  03/15/2015 CT abdomen/ pelvis. 08/04/2016 abdominal radiographs. FINDINGS: Lower chest: No significant pulmonary nodules or acute consolidative airspace disease. Coronary atherosclerosis. Hepatobiliary: Normal liver size. There are a few scattered calcified granulomas in the liver. No liver mass. Normal gallbladder with no radiopaque cholelithiasis. No biliary ductal dilatation. Pancreas: There is a 2.3 x 2.2 cm cystic pancreatic lesion in the uncinate process (series 2/ image 29), not appreciably changed in size since 03/15/2015, although increased from 1.4 x 1.1 cm on 08/22/2011. No additional pancreatic lesions. No pancreatic duct dilation. Spleen: Normal size. No mass. Adrenals/Urinary Tract: No discrete adrenal nodules. Scattered punctate calcifications in the region of the medullary pyramids of both kidneys, favor medullary nephrocalcinosis, not appreciably changed. Simple 1.7 cm renal cyst in the lateral upper right kidney. No additional contour deforming renal masses. No hydronephrosis. No ureteral stones. No bladder stones. No bladder wall thickening. Stomach/Bowel: Grossly normal  stomach. Normal caliber small bowel with no small bowel wall thickening. Appendix not discretely visualized. No pericecal inflammatory changes. Marked sigmoid diverticulosis and mild to moderate diverticulosis throughout the remaining colon, with no large bowel wall thickening or pericolonic fat stranding. Oral contrast  reaches the rectum. Vascular/Lymphatic: Atherosclerotic nonaneurysmal abdominal aorta. No pathologically enlarged lymph nodes in the abdomen or pelvis. Reproductive: Status post hysterectomy, with no abnormal findings at the vaginal cuff. No adnexal mass. Other: No pneumoperitoneum, ascites or focal fluid collection. Stable small fat containing bilateral inguinal hernias. Musculoskeletal: No aggressive appearing focal osseous lesions. Moderate thoracolumbar spondylosis. IMPRESSION: 1. No acute abnormality. No evidence of bowel obstruction or acute bowel inflammation. Diffuse colonic diverticulosis, with no evidence of acute diverticulitis. 2. Indeterminate cystic pancreatic lesion in the uncinate process is stable since 03/15/2015, although increased since 2012. No main pancreatic duct dilation. If not previously performed, recommend further characterization of this lesion with pancreas protocol MRI abdomen without and with IV contrast on a short term outpatient basis as clinically warranted. 3. Additional findings include aortic atherosclerosis, coronary atherosclerosis, medullary nephrocalcinosis and small fat containing bilateral inguinal hernias. Electronically Signed   By: Delbert PhenixJason A Poff M.D.   On: 08/13/2016 16:25    Procedures Procedures (including critical care time)  Medications Ordered in ED Medications  iopamidol (ISOVUE-300) 61 % injection 15 mL (15 mLs Oral Contrast Given 08/13/16 1417)  acetaminophen (TYLENOL) tablet 1,000 mg (1,000 mg Oral Given 08/13/16 1450)     Initial Impression / Assessment and Plan / ED Course  I have reviewed the triage vital signs and the nursing  notes.  Pertinent labs & imaging results that were available during my care of the patient were reviewed by me and considered in my medical decision making (see chart for details).  Clinical Course  Patient presents to the emergency department for abdominal pain. Vital signs reveals a fever of 38.1.  We'll obtain laboratory studies, urinalysis, and CT scan for further evaluation.  She was given Tylenol for fever.   5:20 PM Labs show elevated WBC.  UA is concerning for UTI.  Culture sent.  Will treat in the setting of lower abdominal pain and fever.  She was given keflex in the ED and will DC home with 5 days for treatment.  She appears well and in NAD. Vs remain within her normal limits and she is safe for DC. Final Clinical Impressions(s) / ED Diagnoses   Final diagnoses:  Abdominal pain    New Prescriptions New Prescriptions   No medications on file     Tomasita CrumbleAdeleke Dillon Mcreynolds, MD 08/13/16 [phone]

## 2016-08-13 NOTE — ED Notes (Signed)
[facility] called for transportation  

## 2016-08-15 LAB — URINE CULTURE

## 2016-08-16 ENCOUNTER — Telehealth (HOSPITAL_BASED_OUTPATIENT_CLINIC_OR_DEPARTMENT_OTHER): Payer: Self-pay

## 2016-08-16 NOTE — Telephone Encounter (Signed)
Post ED Visit - Positive Culture Follow-up  Culture report reviewed by antimicrobial stewardship pharmacist:  []  Enzo BiNathan Batchelder, Pharm.D. []  Celedonio MiyamotoJeremy Frens, Pharm.D., BCPS [x]  Garvin FilaMike Maccia, Pharm.D. []  Georgina PillionElizabeth Martin, Pharm.D., BCPS []  AdrianMinh Pham, VermontPharm.D., BCPS, AAHIVP []  Estella HuskMichelle Turner, Pharm.D., BCPS, AAHIVP []  Tennis Mustassie Stewart, Pharm.D. []  Sherle Poeob Vincent, [address]Pharm[address]  Positive urine culture Treated with Cephalexin, organism sensitive to the same and no further patient follow-up is required at this time.  Jerry CarasCullom, Ura Hausen Burnett 08/16/2016, 9:17 AM

## 2016-10-19 ENCOUNTER — Encounter (HOSPITAL_COMMUNITY): Payer: Self-pay | Admitting: Emergency Medicine

## 2016-10-19 ENCOUNTER — Emergency Department (HOSPITAL_COMMUNITY)
Admission: EM | Admit: 2016-10-19 | Discharge: 2016-10-20 | Disposition: A | Discharge status: Home / Self Care | Payer: Medicare Other | Attending: Emergency Medicine | Admitting: Emergency Medicine

## 2016-10-19 DIAGNOSIS — Z87891 Personal history of nicotine dependence: Secondary | ICD-10-CM | POA: Diagnosis not present

## 2016-10-19 DIAGNOSIS — Z79899 Other long term (current) drug therapy: Secondary | ICD-10-CM | POA: Diagnosis not present

## 2016-10-19 DIAGNOSIS — I129 Hypertensive chronic kidney disease with stage 1 through stage 4 chronic kidney disease, or unspecified chronic kidney disease: Secondary | ICD-10-CM | POA: Insufficient documentation

## 2016-10-19 DIAGNOSIS — B372 Candidiasis of skin and nail: Secondary | ICD-10-CM | POA: Insufficient documentation

## 2016-10-19 DIAGNOSIS — N184 Chronic kidney disease, stage 4 (severe): Secondary | ICD-10-CM | POA: Insufficient documentation

## 2016-10-19 DIAGNOSIS — L089 Local infection of the skin and subcutaneous tissue, unspecified: Secondary | ICD-10-CM | POA: Diagnosis present

## 2016-10-19 MED ORDER — CLOTRIMAZOLE 1 % EX CREA
TOPICAL_CREAM | Freq: Once | CUTANEOUS | Status: AC
  Administered 2016-10-19: via TOPICAL
  Filled 2016-10-19: qty 15

## 2016-10-19 NOTE — ED Triage Notes (Addendum)
CALL NEIGHBOR/ CINDY BARR [phone] WHEN PT READY TO BE DISCHARGED  Pt c/o being "broke out" on lower abd/vaginal area x 3 weeks.  States it is from wearing Depends.  Pt has dementia and was outside knocking on neighbor's door in the rain saying that she needed to come to hospital.  Please call neighbor when pt ready to go home.

## 2016-10-19 NOTE — ED Provider Notes (Signed)
[facility]-EMERGENCY DEPT Provider Note   CSN: [id number] Arrival date & time: 10/19/16  2254  By signing my name below, I, Suzan SlickAshley N. Elon SpannerLeger, attest that this documentation has been prepared under the direction and in the presence of Thelda Gagan, MD.  Electronically Signed: Suzan SlickAshley N. Elon SpannerLeger, ED Scribe. 10/19/16. 11:19 PM.    History   Chief Complaint Chief Complaint  Patient presents with  . skin infection   The history is provided by the patient. No language interpreter was used.  Rash   This is a new problem. The problem has not changed since onset.The problem is associated with nothing. There has been no fever. Affected Location: bikini line. The patient is experiencing no pain. Associated symptoms include itching. Treatments tried: powder. The treatment provided no relief.   LEVEL 5 CAVEAT DUE TO DEMENTIA  HPI Comments: Monica [name] is a 82 y.o. female with a PMHx of CKD and HTN who presents to the Emergency Department here for constant, unchanged skin irritation to the lower abdomen x 3 weeks. Pt reports ongoing itching and states she "broke out". No aggravating or alleviating factors reported. OTC baby powder applied to area without any improvement. No recent fever or chills. Pt states she has previously been evaluated by here PCP for same.  PCP: Billee CashingMCKENZIE, WAYLAND, MD    Past Medical History:  Diagnosis Date  . Arthritis   . Atrial flutter (HCC)   . Chronic renal failure   . CKD (chronic kidney disease) stage 4, GFR 15-29 ml/min (HCC)   . Dementia   . ETOHism (HCC)    Quit in the 1980s.  . Gastric ulcer 12/2013.   Prepyloric ulcer. On follow-up EGD in 02/2014 this was 90% healed  . GERD (gastroesophageal reflux disease)   . Hemorrhoids   . Hypertension   . Nephrolithiasis   . Pancreatic cyst 09/2010.   At uncinate process.  Serial CT imaging favors benign process    Patient Active Problem List   Diagnosis Date Noted  . AKI (acute kidney injury) (HCC)   . Essential  hypertension   . Chronic kidney disease (CKD), stage IV (severe) (HCC)   . Pancreatic cyst   . Hematuria   . Encounter for nasogastric (NG) tube placement   . SBO (small bowel obstruction)   . Small bowel obstruction 03/15/2015  . Abscess of buttock 12/29/2013  . HTN (hypertension) 12/29/2013  . PAF (paroxysmal atrial fibrillation) (HCC) 12/29/2013  . Melena 12/29/2013  . CKD (chronic kidney disease), stage IV (HCC) 12/29/2013  . Leukocytosis 12/29/2013  . Escherichia coli urinary tract infection 12/29/2013  . Gastric ulcer with hemorrhage 12/29/2013  . Upper GI bleed 12/28/2013    Past Surgical History:  Procedure Laterality Date  . AV FISTULA PLACEMENT Left 03/20/2015   Procedure: ARTERIOVENOUS (AV) FISTULA CREATION;  Surgeon: Chuck Hinthristopher S Dickson, MD;  [facility];  Service: Vascular;  Laterality: Left;  . COLONOSCOPY  2010   Per Dr. Loreta AveMann  . ESOPHAGOGASTRODUODENOSCOPY N/A 12/29/2013   Procedure: ESOPHAGOGASTRODUODENOSCOPY (EGD);  Surgeon: Hart Carwinora M Brodie, MD;  [facility];  Service: Endoscopy;  Laterality: N/A;  . Exploratory laparotomy with lysis of adhesions  1980  . FLEXIBLE SIGMOIDOSCOPY  08/2011   Per Dr. Randa EvensEdwards. Normal study. Random biopsies positive for trace melanosis coli.  . Surgical pinning  1996   Foot  . TOTAL ABDOMINAL HYSTERECTOMY W/ BILATERAL SALPINGOOPHORECTOMY  1980    OB History    No data available  Home Medications    Prior to Admission medications   Medication Sig Start Date End Date Taking? Authorizing Provider  acetaminophen (TYLENOL) 500 MG tablet Take 1,000 mg by mouth every 6 (six) hours as needed for mild pain.    Historical Provider, MD  ALPRAZolam Prudy Feeler) 0.5 MG tablet Take 0.5 mg by mouth 2 (two) times daily.  01/13/14   Historical Provider, MD  amLODipine (NORVASC) 10 MG tablet Take 10 mg by mouth daily.  01/13/14   Historical Provider, MD  cephALEXin (KEFLEX) 500 MG capsule Take 1 capsule (500 mg total) by mouth 2 (two)  times daily. 08/13/16   Tomasita Crumble, MD  donepezil (ARICEPT) 10 MG tablet Take 10 mg by mouth at bedtime.    Historical Provider, MD  furosemide (LASIX) 80 MG tablet Take 80 mg by mouth daily.    Historical Provider, MD  HYDROcodone-acetaminophen (NORCO/VICODIN) 5-325 MG tablet Take 2 tablets by mouth every 4 (four) hours as needed. Patient taking differently: Take 1 tablet by mouth 2 (two) times daily as needed for moderate pain.  06/21/16   Rolland Porter, MD  methocarbamol (ROBAXIN) 500 MG tablet Take 1 tablet (500 mg total) by mouth at bedtime as needed for muscle spasms. Patient not taking: Reported on 08/13/2016 06/21/16   Rolland Porter, MD  metoprolol tartrate (LOPRESSOR) 25 MG tablet Take 1 tablet (25 mg total) by mouth 2 (two) times daily. 12/31/13   Clydia Llano, MD  nitroGLYCERIN (NITROSTAT) 0.4 MG SL tablet Place 0.4 mg under the tongue every 5 (five) minutes as needed for chest pain.    Historical Provider, MD  pantoprazole (PROTONIX) 40 MG tablet Take 1 tablet (40 mg total) by mouth daily. Patient not taking: Reported on 08/13/2016 03/21/15   Bonney Aid, MD  solifenacin (VESICARE) 5 MG tablet Take 5 mg by mouth daily.    Historical Provider, MD  Tetrahydrozoline HCl (VISINE OP) Place 2 drops into both eyes daily as needed (dry eyes).    Historical Provider, MD    Family History Family History  Problem Relation Age of Onset  . Alcohol abuse Mother     Social History Social History  Substance Use Topics  . Smoking status: Former Smoker    Quit date: 02/14/1986  . Smokeless tobacco: Never Used  . Alcohol use No     Comment: former drinker     Allergies   Aspirin and Codeine   Review of Systems Review of Systems  Unable to perform ROS: Dementia  Musculoskeletal: Negative for arthralgias.  Skin: Positive for itching and rash.     Physical Exam Updated Vital Signs BP 115/65 (BP Location: Right Arm)   Pulse 83   Temp 98.1 F (36.7 C) (Oral)   Resp 18   Ht 5\' 1"  (1.549 m)    Wt 123 lb (55.8 kg)   SpO2 100%   BMI 23.24 kg/m   Physical Exam  Constitutional: She is oriented to person, place, and time. She appears well-developed and well-nourished. No distress.  HENT:  Head: Normocephalic and atraumatic.  Mouth/Throat: Oropharynx is clear and moist. No oropharyngeal exudate.  Eyes: EOM are normal. Pupils are equal, round, and reactive to light.  Neck: Normal range of motion.  Trachea is midline. No carotid bruits.  Cardiovascular: Normal rate, regular rhythm, normal heart sounds and intact distal pulses.   Pulmonary/Chest: Effort normal and breath sounds normal. No stridor. She has no wheezes. She has no rales.  Abdominal: Soft. Bowel sounds are normal. She exhibits  no distension and no mass. There is no tenderness. There is no guarding.  Musculoskeletal: Normal range of motion.  All compartments are soft.  Neurological: She is alert and oriented to person, place, and time. She displays normal reflexes.  Skin: Skin is warm and dry. Capillary refill takes less than 2 seconds. Rash noted.  5 x 3 inch candida infection with sloughing of the skin located to the bikini line.  Psychiatric: She has a normal mood and affect. Judgment normal.  Nursing note and vitals reviewed.    ED Treatments / Results   Vitals:   10/19/16 2300 10/19/16 2315  BP: 115/65 146/62  Pulse: 83 78  Resp: 18   Temp: 98.1 F (36.7 C)     DIAGNOSTIC STUDIES: Oxygen Saturation is 100% on RA, Normal by my interpretation.    COORDINATION OF CARE: 11:15 PM-Discussed treatment plan with pt at bedside and pt agreed to plan.     Procedures Procedures (including critical care time)  Medications Ordered in ED Medications  clotrimazole (LOTRIMIN) 1 % cream ( Topical Given 10/19/16 2354)     Final Clinical Impressions(s) / ED Diagnoses   All questions answered to patient's satisfaction. Based on history and exam patient has been appropriately medically screened and emergency  conditions excluded. Patient is stable for discharge at this time. Follow up with your PMD for recheck in 2 days and strict return precautions given.   I personally performed the services described in this documentation, which was scribed in my presence. The recorded information has been reviewed and is accurate.      Cy BlamerApril Alila Sotero, MD 10/20/16 [phone]

## 2016-10-20 ENCOUNTER — Encounter (HOSPITAL_COMMUNITY): Payer: Self-pay | Admitting: Emergency Medicine

## 2016-10-20 MED ORDER — MICONAZOLE NITRATE 2 % EX CREA: 1.0000 "application " | TOPICAL_CREAM | Freq: Two times a day (BID) | CUTANEOUS | 0 refills | Status: DC

## 2016-10-23 ENCOUNTER — Inpatient Hospital Stay (HOSPITAL_COMMUNITY)
Admission: EM | Admit: 2016-10-23 | Discharge: 2016-10-28 | DRG: 682 | Disposition: A | Discharge status: Skilled Nursing Facility | Payer: Medicare Other | Attending: Internal Medicine | Admitting: Internal Medicine

## 2016-10-23 ENCOUNTER — Encounter (HOSPITAL_COMMUNITY): Payer: Self-pay | Admitting: Emergency Medicine

## 2016-10-23 DIAGNOSIS — D72829 Elevated white blood cell count, unspecified: Secondary | ICD-10-CM | POA: Diagnosis present

## 2016-10-23 DIAGNOSIS — N185 Chronic kidney disease, stage 5: Secondary | ICD-10-CM

## 2016-10-23 DIAGNOSIS — Z888 Allergy status to other drugs, medicaments and biological substances status: Secondary | ICD-10-CM | POA: Diagnosis not present

## 2016-10-23 DIAGNOSIS — I480 Paroxysmal atrial fibrillation: Secondary | ICD-10-CM | POA: Diagnosis present

## 2016-10-23 DIAGNOSIS — Z811 Family history of alcohol abuse and dependence: Secondary | ICD-10-CM | POA: Diagnosis not present

## 2016-10-23 DIAGNOSIS — E876 Hypokalemia: Secondary | ICD-10-CM | POA: Diagnosis present

## 2016-10-23 DIAGNOSIS — N179 Acute kidney failure, unspecified: Secondary | ICD-10-CM | POA: Diagnosis present

## 2016-10-23 DIAGNOSIS — D649 Anemia, unspecified: Secondary | ICD-10-CM | POA: Diagnosis present

## 2016-10-23 DIAGNOSIS — Z992 Dependence on renal dialysis: Secondary | ICD-10-CM

## 2016-10-23 DIAGNOSIS — F1021 Alcohol dependence, in remission: Secondary | ICD-10-CM | POA: Diagnosis present

## 2016-10-23 DIAGNOSIS — E43 Unspecified severe protein-calorie malnutrition: Secondary | ICD-10-CM | POA: Diagnosis present

## 2016-10-23 DIAGNOSIS — F1020 Alcohol dependence, uncomplicated: Secondary | ICD-10-CM | POA: Diagnosis present

## 2016-10-23 DIAGNOSIS — K922 Gastrointestinal hemorrhage, unspecified: Secondary | ICD-10-CM | POA: Diagnosis present

## 2016-10-23 DIAGNOSIS — Z6823 Body mass index (BMI) 23.0-23.9, adult: Secondary | ICD-10-CM

## 2016-10-23 DIAGNOSIS — E872 Acidosis: Secondary | ICD-10-CM | POA: Diagnosis present

## 2016-10-23 DIAGNOSIS — N2581 Secondary hyperparathyroidism of renal origin: Secondary | ICD-10-CM | POA: Diagnosis present

## 2016-10-23 DIAGNOSIS — R627 Adult failure to thrive: Secondary | ICD-10-CM | POA: Diagnosis present

## 2016-10-23 DIAGNOSIS — Z90710 Acquired absence of both cervix and uterus: Secondary | ICD-10-CM

## 2016-10-23 DIAGNOSIS — Z87891 Personal history of nicotine dependence: Secondary | ICD-10-CM

## 2016-10-23 DIAGNOSIS — F0280 Dementia in other diseases classified elsewhere without behavioral disturbance: Secondary | ICD-10-CM | POA: Diagnosis not present

## 2016-10-23 DIAGNOSIS — F0390 Unspecified dementia without behavioral disturbance: Secondary | ICD-10-CM | POA: Diagnosis present

## 2016-10-23 DIAGNOSIS — Z885 Allergy status to narcotic agent status: Secondary | ICD-10-CM

## 2016-10-23 DIAGNOSIS — R634 Abnormal weight loss: Secondary | ICD-10-CM

## 2016-10-23 DIAGNOSIS — Z886 Allergy status to analgesic agent status: Secondary | ICD-10-CM | POA: Diagnosis not present

## 2016-10-23 DIAGNOSIS — D631 Anemia in chronic kidney disease: Secondary | ICD-10-CM

## 2016-10-23 DIAGNOSIS — I10 Essential (primary) hypertension: Secondary | ICD-10-CM | POA: Diagnosis not present

## 2016-10-23 DIAGNOSIS — R4189 Other symptoms and signs involving cognitive functions and awareness: Secondary | ICD-10-CM | POA: Diagnosis present

## 2016-10-23 DIAGNOSIS — I120 Hypertensive chronic kidney disease with stage 5 chronic kidney disease or end stage renal disease: Secondary | ICD-10-CM | POA: Diagnosis present

## 2016-10-23 DIAGNOSIS — K219 Gastro-esophageal reflux disease without esophagitis: Secondary | ICD-10-CM | POA: Diagnosis present

## 2016-10-23 DIAGNOSIS — R079 Chest pain, unspecified: Secondary | ICD-10-CM | POA: Diagnosis not present

## 2016-10-23 DIAGNOSIS — N19 Unspecified kidney failure: Secondary | ICD-10-CM | POA: Diagnosis not present

## 2016-10-23 DIAGNOSIS — M6281 Muscle weakness (generalized): Secondary | ICD-10-CM

## 2016-10-23 DIAGNOSIS — G9341 Metabolic encephalopathy: Secondary | ICD-10-CM | POA: Insufficient documentation

## 2016-10-23 HISTORY — DX: Anemia in chronic kidney disease: N18.5

## 2016-10-23 HISTORY — DX: Abnormal weight loss: R63.4

## 2016-10-23 HISTORY — DX: Anemia in chronic kidney disease: D63.1

## 2016-10-23 LAB — BASIC METABOLIC PANEL
ANION GAP: 12 (ref 5–15)
BUN: 89 mg/dL — ABNORMAL HIGH (ref 6–20)
CALCIUM: 8.7 mg/dL — AB (ref 8.9–10.3)
CO2: 13 mmol/L — ABNORMAL LOW (ref 22–32)
Chloride: 112 mmol/L — ABNORMAL HIGH (ref 101–111)
Creatinine, Ser: 10.61 mg/dL — ABNORMAL HIGH (ref 0.44–1.00)
GFR, EST AFRICAN AMERICAN: 3 mL/min — AB (ref >60)
GFR, EST NON AFRICAN AMERICAN: 3 mL/min — AB (ref >60)
GLUCOSE: 102 mg/dL — AB (ref 65–99)
POTASSIUM: 3.6 mmol/L (ref 3.5–5.1)
SODIUM: 137 mmol/L (ref 135–145)

## 2016-10-23 LAB — IRON AND TIBC
Iron: 74 ug/dL (ref 28–170)
SATURATION RATIOS: 32 % — AB (ref 10.4–31.8)
TIBC: 231 ug/dL — ABNORMAL LOW (ref 250–450)
UIBC: 157 ug/dL

## 2016-10-23 LAB — URINALYSIS, ROUTINE W REFLEX MICROSCOPIC
Bilirubin Urine: NEGATIVE
GLUCOSE, UA: NEGATIVE mg/dL
Ketones, ur: NEGATIVE mg/dL
NITRITE: NEGATIVE
PROTEIN: 100 mg/dL — AB
Specific Gravity, Urine: 1.012 (ref 1.005–1.030)
pH: 5 (ref 5.0–8.0)

## 2016-10-23 LAB — VITAMIN B12: VITAMIN B 12: 966 pg/mL — AB (ref 180–914)

## 2016-10-23 LAB — FERRITIN: Ferritin: 159 ng/mL (ref 11–307)

## 2016-10-23 LAB — RETICULOCYTES
RBC.: 2.91 MIL/uL — AB (ref 3.87–5.11)
RETIC COUNT ABSOLUTE: 145.5 10*3/uL (ref 19.0–186.0)
RETIC CT PCT: 5 % — AB (ref 0.4–3.1)

## 2016-10-23 LAB — MAGNESIUM: Magnesium: 2 mg/dL (ref 1.7–2.4)

## 2016-10-23 LAB — URINE MICROSCOPIC-ADD ON

## 2016-10-23 LAB — PREALBUMIN: Prealbumin: 32.9 mg/dL (ref 18–38)

## 2016-10-23 LAB — FOLATE: FOLATE: 9.9 ng/mL (ref >5.9)

## 2016-10-23 LAB — CBC
HEMATOCRIT: 26.8 % — AB (ref 36.0–46.0)
HEMOGLOBIN: 8.9 g/dL — AB (ref 12.0–15.0)
MCH: 27.5 pg (ref 26.0–34.0)
MCHC: 33.2 g/dL (ref 30.0–36.0)
MCV: 82.7 fL (ref 78.0–100.0)
Platelets: 337 10*3/uL (ref 150–400)
RBC: 3.24 MIL/uL — AB (ref 3.87–5.11)
RDW: 16.1 % — ABNORMAL HIGH (ref 11.5–15.5)
WBC: 6.3 10*3/uL (ref 4.0–10.5)

## 2016-10-23 LAB — PHOSPHORUS
PHOSPHORUS: 6.9 mg/dL — AB (ref 2.5–4.6)
Phosphorus: 6.4 mg/dL — ABNORMAL HIGH (ref 2.5–4.6)

## 2016-10-23 LAB — TSH: TSH: 1.356 u[IU]/mL (ref 0.350–4.500)

## 2016-10-23 MED ORDER — AMLODIPINE BESYLATE 10 MG PO TABS
10.0000 mg | ORAL_TABLET | Freq: Every day | ORAL | Status: DC
  Administered 2016-10-23: 10 mg via ORAL
  Filled 2016-10-23 (×2): qty 1

## 2016-10-23 MED ORDER — ACETAMINOPHEN 325 MG PO TABS
650.0000 mg | ORAL_TABLET | Freq: Four times a day (QID) | ORAL | Status: DC | PRN
  Administered 2016-10-24 – 2016-10-28 (×7): 650 mg via ORAL
  Filled 2016-10-23 (×5): qty 2

## 2016-10-23 MED ORDER — TETRAHYDROZOLINE HCL 0.05 % OP SOLN
2.0000 [drp] | Freq: Four times a day (QID) | OPHTHALMIC | Status: DC
  Administered 2016-10-23 – 2016-10-28 (×18): 2 [drp] via OPHTHALMIC
  Filled 2016-10-23 (×2): qty 15

## 2016-10-23 MED ORDER — ALPRAZOLAM 0.5 MG PO TABS
0.5000 mg | ORAL_TABLET | Freq: Two times a day (BID) | ORAL | Status: DC
  Administered 2016-10-23 – 2016-10-28 (×10): 0.5 mg via ORAL
  Filled 2016-10-23 (×10): qty 1

## 2016-10-23 MED ORDER — HEPARIN SODIUM (PORCINE) 5000 UNIT/ML IJ SOLN
5000.0000 [IU] | Freq: Three times a day (TID) | INTRAMUSCULAR | Status: DC
  Administered 2016-10-23 – 2016-10-28 (×14): 5000 [IU] via SUBCUTANEOUS
  Filled 2016-10-23 (×12): qty 1

## 2016-10-23 MED ORDER — DONEPEZIL HCL 10 MG PO TABS
10.0000 mg | ORAL_TABLET | Freq: Every day | ORAL | Status: DC
  Administered 2016-10-23 – 2016-10-27 (×5): 10 mg via ORAL
  Filled 2016-10-23 (×5): qty 1

## 2016-10-23 MED ORDER — DARBEPOETIN ALFA 60 MCG/0.3ML IJ SOSY
60.0000 ug | PREFILLED_SYRINGE | INTRAMUSCULAR | Status: DC
  Administered 2016-10-24: 60 ug via INTRAVENOUS
  Filled 2016-10-23: qty 0.3

## 2016-10-23 MED ORDER — ACETAMINOPHEN 650 MG RE SUPP
650.0000 mg | Freq: Four times a day (QID) | RECTAL | Status: DC | PRN
Start: 2016-10-23 — End: 2016-10-28

## 2016-10-23 MED ORDER — METOPROLOL TARTRATE 25 MG PO TABS
25.0000 mg | ORAL_TABLET | Freq: Two times a day (BID) | ORAL | Status: DC
  Administered 2016-10-23: 25 mg via ORAL
  Filled 2016-10-23 (×2): qty 1

## 2016-10-23 NOTE — ED Notes (Signed)
Pt had an episode of urinary incontinence minutes after pt asked if she could urinate and she stated she didn't have to. Pt cleaned and linens changed.

## 2016-10-23 NOTE — H&P (Signed)
History and Physical    Monica [name]:[id number] DOB: January 24, 1932 DOA: 10/23/2016   PCP: Billee Cashing, MD   Patient coming from/Resides with: Private residence/lives alone; states has adult son who is an alcoholic and of no help to her  Admission status: Inpatient/renal floor -medically necessary to stay a minimum 2 midnights to rule out impending and/or unexpected changes in physiologic status that may differ from initial evaluation performed in the ER and/or at time of admission. Patient presents with progressive kidney disease worse over the past 2 months with progressive uremia and generalized weakness. EDP has discussed with nephrology who recommends initiating hemodialysis this admission. Patient lives at home alone and has a diagnosis of dementia and there are concerns about patient immediately returning to the home environment after initiation of hemodialysis.  Chief Complaint: Progressive uremia  HPI: Monica [name] is a 82 y.o. female with medical history significant for advanced chronic kidney disease currently at stage V, progressive anemia secondary to renal disease, prior GI bleed, hypertension, paroxysmal atrial fibrillation and dementia. Patient had routine labs completed that revealed worsening of her renal function with creatinine up to 10.61 and BUN greater than 80. She was sent to the ER for further evaluation and treatment. She reports excessive urinary output and she attributes this to her daily Lasix dosing. She has not had any respiratory symptoms. She has not had any blood in urine or stool. She does report unintentional weight loss over unknown period time from 180 pounds to 123 pounds despite her reports eating well.  ED Course:  Vital Signs: BP 125/57 (BP Location: Right Arm)   Pulse 71   Temp 98.1 F (36.7 C) (Oral)   Resp 14   Ht 5\' 1"  (1.549 m)   Wt 55.8 kg (123 lb)   SpO2 100%   BMI 23.24 kg/m  Lab data: Sodium 137, potassium 3.6, chloride 112, CO2 13,  BUN 89, creatinine 10.61, glucose 102, anion gap 12, white count 6300 differential not obtained, hemoglobin 8.9, hematocrit 26.8, platelets 337,000 Medications and treatments: None  Review of Systems:  In addition to the HPI above,  No Fever-chills, myalgias or other constitutional symptoms No Headache, changes with Vision or hearing, new weakness, tingling, numbness in any extremity, dizziness, dysarthria or word finding difficulty, gait disturbance or imbalance, tremors or seizure activity No problems swallowing food or Liquids, indigestion/reflux, choking or coughing while eating, abdominal pain with or after eating No Chest pain, Cough or Shortness of Breath, palpitations, orthopnea or DOE No Abdominal pain, N/V, melena,hematochezia, dark tarry stools, constipation No dysuria, malodorous urine, hematuria or flank pain No new skin rashes, lesions, masses or bruises, No new joint pains, aches, swelling or redness No recent unintentional weight gain  No polyuria, polydypsia or polyphagia   Past Medical History:  Diagnosis Date  . Arthritis   . Atrial flutter (HCC)   . Chronic renal failure   . CKD (chronic kidney disease) stage 4, GFR 15-29 ml/min (HCC)   . Dementia   . ETOHism (HCC)    Quit in the 1980s.  . Gastric ulcer 12/2013.   Prepyloric ulcer. On follow-up EGD in 02/2014 this was 90% healed  . GERD (gastroesophageal reflux disease)   . Hemorrhoids   . Hypertension   . Nephrolithiasis   . Pancreatic cyst 09/2010.   At uncinate process.  Serial CT imaging favors benign process    Past Surgical History:  Procedure Laterality Date  . AV FISTULA PLACEMENT Left 03/20/2015   Procedure:  ARTERIOVENOUS (AV) FISTULA CREATION;  Surgeon: Chuck Hinthristopher S Dickson, MD;  [facility];  Service: Vascular;  Laterality: Left;  . COLONOSCOPY  2010   Per Dr. Loreta AveMann  . ESOPHAGOGASTRODUODENOSCOPY N/A 12/29/2013   Procedure: ESOPHAGOGASTRODUODENOSCOPY (EGD);  Surgeon: Hart Carwinora M Brodie, MD;   [facility];  Service: Endoscopy;  Laterality: N/A;  . Exploratory laparotomy with lysis of adhesions  1980  . FLEXIBLE SIGMOIDOSCOPY  08/2011   Per Dr. Randa EvensEdwards. Normal study. Random biopsies positive for trace melanosis coli.  . Surgical pinning  1996   Foot  . TOTAL ABDOMINAL HYSTERECTOMY W/ BILATERAL SALPINGOOPHORECTOMY  1980    Social History   Social History  . Marital status: Married    Spouse name: N/A  . Number of children: N/A  . Years of education: N/A   Occupational History  . Not on file.   Social History Main Topics  . Smoking status: Former Smoker    Quit date: 02/14/1986  . Smokeless tobacco: Never Used  . Alcohol use No     Comment: former drinker  . Drug use: No  . Sexual activity: Not on file   Other Topics Concern  . Not on file   Social History Narrative  . No narrative on file    Mobility: Cane or rolling walker typically has difficulty with a cane secondary to reports of tripping as use Work history: Not obtained   Allergies  Allergen Reactions  . Aspirin Other (See Comments)    "makes my stomach hurt"  . Codeine Other (See Comments)    "i get irritated"    Family History  Problem Relation Age of Onset  . Alcohol abuse Mother      Prior to Admission medications   Medication Sig Start Date End Date Taking? Authorizing Provider  acetaminophen (TYLENOL) 500 MG tablet Take 1,000 mg by mouth every 6 (six) hours as needed for mild pain.   Yes Historical Provider, MD  ALPRAZolam Prudy Feeler(XANAX) 0.5 MG tablet Take 0.5 mg by mouth 2 (two) times daily.  01/13/14  Yes Historical Provider, MD  amLODipine (NORVASC) 10 MG tablet Take 10 mg by mouth daily.  01/13/14  Yes Historical Provider, MD  donepezil (ARICEPT) 10 MG tablet Take 10 mg by mouth at bedtime.   Yes Historical Provider, MD  furosemide (LASIX) 80 MG tablet Take 80 mg by mouth daily.   Yes Historical Provider, MD  metoprolol tartrate (LOPRESSOR) 25 MG tablet Take 1 tablet (25 mg total) by  mouth 2 (two) times daily. 12/31/13  Yes Clydia LlanoMutaz Elmahi, MD  miconazole (MICOTIN) 2 % cream Apply 1 application topically 2 (two) times daily. 10/20/16  Yes April Palumbo, MD  [facility] powder Apply 1 application topically at bedtime. 10/22/16  Yes Historical Provider, MD  Tetrahydrozoline-Zn Sulfate (ALLERGY RELIEF EYE DROPS OP) Place 2 drops into both eyes as needed (for dry/itchy eyes).   Yes Historical Provider, MD  cephALEXin (KEFLEX) 500 MG capsule Take 1 capsule (500 mg total) by mouth 2 (two) times daily. Patient not taking: Reported on 10/23/2016 08/13/16   Tomasita CrumbleAdeleke Oni, MD  HYDROcodone-acetaminophen (NORCO/VICODIN) 5-325 MG tablet Take 2 tablets by mouth every 4 (four) hours as needed. Patient not taking: Reported on 10/23/2016 06/21/16   Rolland PorterMark James, MD  methocarbamol (ROBAXIN) 500 MG tablet Take 1 tablet (500 mg total) by mouth at bedtime as needed for muscle spasms. Patient not taking: Reported on 10/23/2016 06/21/16   Rolland PorterMark James, MD  nitroGLYCERIN (NITROSTAT) 0.4 MG SL tablet Place 0.4 mg  under the tongue every 5 (five) minutes as needed for chest pain.    Historical Provider, MD  pantoprazole (PROTONIX) 40 MG tablet Take 1 tablet (40 mg total) by mouth daily. Patient not taking: Reported on 10/23/2016 03/21/15   Bonney Aid, MD  Tetrahydrozoline HCl (VISINE OP) Place 2 drops into both eyes daily as needed (dry eyes).    Historical Provider, MD    Physical Exam: Vitals:   10/23/16 1025 10/23/16 1030 10/23/16 1220 10/23/16 1251  BP: 139/75  123/57 125/57  Pulse: 78  70 71  Resp: 17   14  Temp: 98.1 F (36.7 C)     TempSrc: Oral     SpO2: 100%  95% 100%  Weight:  55.8 kg (123 lb)    Height:  5\' 1"  (1.549 m)        Constitutional: NAD, calm, comfortable;Uremic odor to breath Eyes: PERRL, lids and conjunctivae normal ENMT: Mucous membranes are moist. Posterior pharynx clear of any exudate or lesions.Normal dentition.  Neck: normal, supple, no masses, no thyromegaly Respiratory:  clear to auscultation bilaterally, no wheezing, no crackles. Normal respiratory effort. No accessory muscle use.  Cardiovascular: Regular rate and rhythm, no murmurs / rubs / gallops. No extremity edema. 2+ pedal pulses. No carotid bruits.  Abdomen: no tenderness, no masses palpated. No hepatosplenomegaly. Bowel sounds positive.  Musculoskeletal: no clubbing / cyanosis. No joint deformity upper and lower extremities. Good ROM, no contractures. Normal muscle tone.  Skin: no rashes, lesions, ulcers. No induration Neurologic: CN 2-12 grossly intact. Sensation intact, DTR normal. Strength 5/5 x all 4 extremities.  Psychiatric: Normal judgment and insight. Alert and oriented x 3. Normal mood.    Labs on Admission: I have personally reviewed following labs and imaging studies  CBC:  Recent Labs Lab 10/23/16 1104  WBC 6.3  HGB 8.9*  HCT 26.8*  MCV 82.7  PLT 337   Basic Metabolic Panel:  Recent Labs Lab 10/23/16 1104  NA 137  K 3.6  CL 112*  CO2 13*  GLUCOSE 102*  BUN 89*  CREATININE 10.61*  CALCIUM 8.7*   GFR: Estimated Creatinine Clearance: 3.1 mL/min (by C-G formula based on SCr of 10.61 mg/dL (H)). Liver Function Tests: No results for input(s): AST, ALT, ALKPHOS, BILITOT, PROT, ALBUMIN in the last 168 hours. No results for input(s): LIPASE, AMYLASE in the last 168 hours. No results for input(s): AMMONIA in the last 168 hours. Coagulation Profile: No results for input(s): INR, PROTIME in the last 168 hours. Cardiac Enzymes: No results for input(s): CKTOTAL, CKMB, CKMBINDEX, TROPONINI in the last 168 hours. BNP (last 3 results) No results for input(s): PROBNP in the last 8760 hours. HbA1C: No results for input(s): HGBA1C in the last 72 hours. CBG: No results for input(s): GLUCAP in the last 168 hours. Lipid Profile: No results for input(s): CHOL, HDL, LDLCALC, TRIG, CHOLHDL, LDLDIRECT in the last 72 hours. Thyroid Function Tests: No results for input(s): TSH, T4TOTAL,  FREET4, T3FREE, THYROIDAB in the last 72 hours. Anemia Panel: No results for input(s): VITAMINB12, FOLATE, FERRITIN, TIBC, IRON, RETICCTPCT in the last 72 hours. Urine analysis:    Component Value Date/Time   COLORURINE YELLOW 08/13/2016 1622   APPEARANCEUR CLOUDY (A) 08/13/2016 1622   LABSPEC 1.012 08/13/2016 1622   PHURINE 6.0 08/13/2016 1622   GLUCOSEU NEGATIVE 08/13/2016 1622   HGBUR LARGE (A) 08/13/2016 1622   BILIRUBINUR NEGATIVE 08/13/2016 1622   KETONESUR NEGATIVE 08/13/2016 1622   PROTEINUR 100 (A) 08/13/2016 1622  UROBILINOGEN 0.2 03/15/2015 1510   NITRITE NEGATIVE 08/13/2016 1622   LEUKOCYTESUR MODERATE (A) 08/13/2016 1622   Sepsis Labs: @LABRCNTIP (procalcitonin:4,lacticidven:4) )No results found for this or any previous visit (from the past 240 hour(s)).   Radiological Exams on Admission: No results found.  EKG: (Independently reviewed) Sinus rhythm with ventricular rate 70 bpm, unifocal PVC, QTC 457 ms, elevated J-point in inferior lateral leads without evidence of ischemia  Assessment/Plan Principal Problem:   Uremia/ CKD (chronic kidney disease) stage 5, GFR less than 15 ml/min  -Patient presents to ER with abnormal labs with progressive uremia and chronic kidney disease with likely initiation of dialysis during this admission -Patient reports excessive urinary output -No respiratory symptoms and potassium is stable -Does have associated metabolic acidemia and will defer to nephrology whether she needs to start bicarbonate since it is expected she will start dialysis this admission -Formal nephrology consultation pending -Daily weights and strict intake and output -Hold Lasix -Check phosphorus -Renal function panel in a.m. -Of note was evaluated in January of this year to determine if plasma cell disorder contributing to her renal failure with oncologist determining this to be less likely [facility]) -Left upper extremity AV fistula previously placed in April  2016  Active Problems:   Unintentional weight loss -Likely from combination of reported dementia and chronic kidney disease- the patient also reporting that she eats well -In review of weight trends in the EMR her weight was around 170 pounds in November 2016 and currently is 123 pounds -Pre-albumin -Nutritional consultation    HTN (hypertension) -Blood pressure controlled -Continue preadmission Norvasc and Lopressor    Anemia, chronic renal failure, stage 5  -Suspect primary cause of anemia is related to chronic kidney disease -With unexplained weight loss check stools for blood to rule out possible GI malignancy-patient had colonoscopy in 2010 at Dr. Kenna GilbertMann's office; in 2015 she had EGD that revealed a prepyloric ulcer that was H. pylori negative with repeat EGD 2 months later showed 90% healing -Check TSH -Anemia panel -Follow hemoglobin trends -Anticipate nephrology may start her on erythropoietin replacement   Reported history of PAF (paroxysmal atrial fibrillation)  -Currently maintaining sinus rhythm -Continue Lopressor -Not on anticoagulation secondary to history of GI bleed and remote alcoholism in the 1980s    Dementia -Listed in medical problems and patient on Aricept prior to admission -Does have remote history of alcoholism -Currently patient is alert and oriented 4 -Social work consulted as was PT/OT to determine appropriateness for discharge back to home environment alone      DVT prophylaxis: Subcutaneous heparin Code Status: Full  Family Communication: No family at bedside at time of evaluation  Disposition Plan: Undetermined-patient wishes to return to preadmission home environment if it is safe to do so Consults called: Nephrology/Patel     Jozi Malachi L. ANP-BC [facility] Pager [phone]   If 7PM-7AM, please contact night-coverage [email or web address] Password [email or web address]  10/23/2016, 1:39 PM

## 2016-10-23 NOTE — ED Provider Notes (Signed)
[facility]Y DEPT Provider Note   CSN: [id number] Arrival date & time: 10/23/16  1020     History   Chief Complaint Chief Complaint  Patient presents with  . Chronic Kidney Disease    HPI Monica LimesMary S [name] is a 82 y.o. female.  HPI  82 year old female with extensive past medical history including CKD, and dementia presents to the ED at the request of her primary care provider for evaluation given worsening renal function. Patient unable to express the reason why she is here however she does report having excessive urinary output while on Lasix. Denies any dysuria. Patient denies any respiratory symptoms. She denies any recent fevers, illnesses. Denies any chest pain.  Of note patient already has a left AV fistula.   Past Medical History:  Diagnosis Date  . Arthritis   . Atrial flutter (HCC)   . Chronic renal failure   . CKD (chronic kidney disease) stage 4, GFR 15-29 ml/min (HCC)   . Dementia   . ETOHism (HCC)    Quit in the 1980s.  . Gastric ulcer 12/2013.   Prepyloric ulcer. On follow-up EGD in 02/2014 this was 90% healed  . GERD (gastroesophageal reflux disease)   . Hemorrhoids   . Hypertension   . Nephrolithiasis   . Pancreatic cyst 09/2010.   At uncinate process.  Serial CT imaging favors benign process    Patient Active Problem List   Diagnosis Date Noted  . AKI (acute kidney injury) (HCC)   . Essential hypertension   . Chronic kidney disease (CKD), stage IV (severe) (HCC)   . Pancreatic cyst   . Hematuria   . Encounter for nasogastric (NG) tube placement   . SBO (small bowel obstruction)   . Small bowel obstruction 03/15/2015  . Abscess of buttock 12/29/2013  . HTN (hypertension) 12/29/2013  . PAF (paroxysmal atrial fibrillation) (HCC) 12/29/2013  . Melena 12/29/2013  . CKD (chronic kidney disease), stage IV (HCC) 12/29/2013  . Leukocytosis 12/29/2013  . Escherichia coli urinary tract infection 12/29/2013  . Gastric ulcer with hemorrhage 12/29/2013  .  Upper GI bleed 12/28/2013    Past Surgical History:  Procedure Laterality Date  . AV FISTULA PLACEMENT Left 03/20/2015   Procedure: ARTERIOVENOUS (AV) FISTULA CREATION;  Surgeon: Chuck Hinthristopher S Dickson, MD;  [facility];  Service: Vascular;  Laterality: Left;  . COLONOSCOPY  2010   Per Dr. Loreta AveMann  . ESOPHAGOGASTRODUODENOSCOPY N/A 12/29/2013   Procedure: ESOPHAGOGASTRODUODENOSCOPY (EGD);  Surgeon: Hart Carwinora M Brodie, MD;  [facility];  Service: Endoscopy;  Laterality: N/A;  . Exploratory laparotomy with lysis of adhesions  1980  . FLEXIBLE SIGMOIDOSCOPY  08/2011   Per Dr. Randa EvensEdwards. Normal study. Random biopsies positive for trace melanosis coli.  . Surgical pinning  1996   Foot  . TOTAL ABDOMINAL HYSTERECTOMY W/ BILATERAL SALPINGOOPHORECTOMY  1980    OB History    No data available       Home Medications    Prior to Admission medications   Medication Sig Start Date End Date Taking? Authorizing Provider  acetaminophen (TYLENOL) 500 MG tablet Take 1,000 mg by mouth every 6 (six) hours as needed for mild pain.    Historical Provider, MD  ALPRAZolam Prudy Feeler(XANAX) 0.5 MG tablet Take 0.5 mg by mouth 2 (two) times daily.  01/13/14   Historical Provider, MD  amLODipine (NORVASC) 10 MG tablet Take 10 mg by mouth daily.  01/13/14   Historical Provider, MD  cephALEXin (KEFLEX) 500 MG capsule Take 1 capsule (500 mg  total) by mouth 2 (two) times daily. 08/13/16   Tomasita Crumble, MD  donepezil (ARICEPT) 10 MG tablet Take 10 mg by mouth at bedtime.    Historical Provider, MD  furosemide (LASIX) 80 MG tablet Take 80 mg by mouth daily.    Historical Provider, MD  HYDROcodone-acetaminophen (NORCO/VICODIN) 5-325 MG tablet Take 2 tablets by mouth every 4 (four) hours as needed. Patient taking differently: Take 1 tablet by mouth 2 (two) times daily as needed for moderate pain.  06/21/16   Rolland Porter, MD  methocarbamol (ROBAXIN) 500 MG tablet Take 1 tablet (500 mg total) by mouth at bedtime as needed for muscle  spasms. Patient not taking: Reported on 08/13/2016 06/21/16   Rolland Porter, MD  metoprolol tartrate (LOPRESSOR) 25 MG tablet Take 1 tablet (25 mg total) by mouth 2 (two) times daily. 12/31/13   Clydia Llano, MD  miconazole (MICOTIN) 2 % cream Apply 1 application topically 2 (two) times daily. 10/20/16   April Palumbo, MD  nitroGLYCERIN (NITROSTAT) 0.4 MG SL tablet Place 0.4 mg under the tongue every 5 (five) minutes as needed for chest pain.    Historical Provider, MD  pantoprazole (PROTONIX) 40 MG tablet Take 1 tablet (40 mg total) by mouth daily. Patient not taking: Reported on 08/13/2016 03/21/15   Bonney Aid, MD  solifenacin (VESICARE) 5 MG tablet Take 5 mg by mouth daily.    Historical Provider, MD  Tetrahydrozoline HCl (VISINE OP) Place 2 drops into both eyes daily as needed (dry eyes).    Historical Provider, MD    Family History Family History  Problem Relation Age of Onset  . Alcohol abuse Mother     Social History Social History  Substance Use Topics  . Smoking status: Former Smoker    Quit date: 02/14/1986  . Smokeless tobacco: Never Used  . Alcohol use No     Comment: former drinker     Allergies   Aspirin and Codeine   Review of Systems Review of Systems Ten systems are reviewed and are negative for acute change except as noted in the HPI   Physical Exam Updated Vital Signs BP 139/75 (BP Location: Right Arm)   Pulse 78   Temp 98.1 F (36.7 C) (Oral)   Resp 17   Ht 5\' 1"  (1.549 m)   Wt 123 lb (55.8 kg)   SpO2 100%   BMI 23.24 kg/m   Physical Exam  Constitutional: She is oriented to person, place, and time. She appears well-developed and well-nourished. No distress.  HENT:  Head: Normocephalic and atraumatic.  Nose: Nose normal.  Eyes: Conjunctivae and EOM are normal. Pupils are equal, round, and reactive to light. Right eye exhibits no discharge. Left eye exhibits no discharge. No scleral icterus.  Neck: Normal range of motion. Neck supple.    Cardiovascular: Normal rate and regular rhythm.  Exam reveals no gallop and no friction rub.   No murmur heard. Pulmonary/Chest: Effort normal and breath sounds normal. No stridor. No respiratory distress. She has no rales.  Abdominal: Soft. She exhibits no distension. There is no tenderness.  Musculoskeletal: She exhibits no edema or tenderness.  Neurological: She is alert and oriented to person, place, and time.  Skin: Skin is warm and dry. No rash noted. She is not diaphoretic. No erythema.  Psychiatric: She has a normal mood and affect.  Vitals reviewed.    ED Treatments / Results  Labs (all labs ordered are listed, but only abnormal results are displayed) Labs Reviewed  BASIC METABOLIC PANEL - Abnormal; Notable for the following:       Result Value   Chloride 112 (*)    CO2 13 (*)    Glucose, Bld 102 (*)    BUN 89 (*)    Creatinine, Ser 10.61 (*)    Calcium 8.7 (*)    GFR calc non Af Amer 3 (*)    GFR calc Af Amer 3 (*)    All other components within normal limits  CBC - Abnormal; Notable for the following:    RBC 3.24 (*)    Hemoglobin 8.9 (*)    HCT 26.8 (*)    RDW 16.1 (*)    All other components within normal limits  URINALYSIS, ROUTINE W REFLEX MICROSCOPIC (NOT AT [facility])    EKG  EKG Interpretation  Date/Time:  Thursday October 23 2016 12:47:25 EST Ventricular Rate:  70 PR Interval:    QRS Duration: 104 QT Interval:  423 QTC Calculation: 457 R Axis:   48 Text Interpretation:  Sinus rhythm Ventricular premature complex Borderline prolonged PR interval Baseline wander in lead(s) V6 No significant change since last tracing Confirmed by [facility] MD, River Mckercher ([id number]) on 10/23/2016 12:58:58 PM       Radiology No results found.  Procedures Procedures (including critical care time)  Medications Ordered in ED Medications - No data to display   Initial Impression / Assessment and Plan / ED Course  I have reviewed the triage vital signs and the nursing  notes.  Pertinent labs & imaging results that were available during my care of the patient were reviewed by me and considered in my medical decision making (see chart for details).  Clinical Course     Worsening renal failure. Given patient's baseline dementia, were unable to rule out metabolic encephalopathy. Patient does have a significant elevation in her creatinine as well as BUN. Possible uremia. The patient requires admission for continued workup and management. Discussed case with nephrology who agreed to initiate dialysis during admission. Discussed case with hospitalist will admit the patient for further workup and management.  Final Clinical Impressions(s) / ED Diagnoses   Final diagnoses:  Acute renal failure superimposed on stage 5 chronic kidney disease, not on chronic dialysis, unspecified acute renal failure type (HCC)    Disposition: Admit  Condition: stable    Nira ConnPedro Eduardo Fremon Zacharia, MD 10/23/16 [phone]

## 2016-10-23 NOTE — ED Notes (Signed)
Pt states her MD advised her to come into the hospital rt renal failure. Pt states she has had a dialysis graft for over a year with no talk of actually receiving dialysis. Pt has no complaints at this time.

## 2016-10-23 NOTE — ED Notes (Signed)
Ordered tray 

## 2016-10-23 NOTE — ED Notes (Signed)
Pt's neighbor Rosezella FloridaCindy Barr [phone] will pick pt up if discharged

## 2016-10-23 NOTE — Consult Note (Signed)
Reason for Consult: Failure to thrive in patient with chronic kidney disease stage V Referring Physician: Shelly Flattenavid Merrell M.D. [facility])  HPI: 82 year old African-American woman with past medical history significant for chronic kidney disease stage V that is suspected to have been from underlying hypertension. She also has a history of mild dementia, arthritis and poor social situation/support living by herself. She supposed to have been following up with Dr. Lowell GuitarPowell of nephrology however last saw him back in January and was lost to follow-up because of her inability to pay for visits. She recently had labs performed at her primary care provider's office (Dr. Mirna MiresGerald Hill) who noticed elevated creatinine of 10 that had risen progressively from around 5 last year associated with decreasing oral intake/weight loss and directed her to come to the emergency room for further evaluation and management. She has a mature left brachiocephalic fistula (placed in April, 2016 by Dr. Edilia Boickson)  She denies any nausea, vomiting or diarrhea and has rare dysgeusia. She denies any chest pain or shortness of breath and states that leg edema is much better after recent increase of furosemide who she also remarks that that has left her very wrinkled. She denies any dysuria, urgency, flank pain, fever or chills and has chronic urinary incontinence/frequency.  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 She also understands that choosing not to do dialysis could result in her mortality.  Past Medical History:  Diagnosis Date  . Arthritis   . Atrial flutter (HCC)   . Chronic renal failure   . CKD (chronic kidney disease) stage 4, GFR 15-29 ml/min (HCC)   . Dementia   . ETOHism (HCC)    Quit in the 1980s.  . Gastric ulcer 12/2013.   Prepyloric ulcer. On follow-up EGD in 02/2014 this was 90% healed  . GERD (gastroesophageal reflux disease)   .  Hemorrhoids   . Hypertension   . Nephrolithiasis   . Pancreatic cyst 09/2010.   At uncinate process.  Serial CT imaging favors benign process    Past Surgical History:  Procedure Laterality Date  . AV FISTULA PLACEMENT Left 03/20/2015   Procedure: ARTERIOVENOUS (AV) FISTULA CREATION;  Surgeon: Chuck Hinthristopher S Dickson, MD;  [facility];  Service: Vascular;  Laterality: Left;  . COLONOSCOPY  2010   Per Dr. Loreta AveMann  . ESOPHAGOGASTRODUODENOSCOPY N/A 12/29/2013   Procedure: ESOPHAGOGASTRODUODENOSCOPY (EGD);  Surgeon: Hart Carwinora M Brodie, MD;  [facility];  Service: Endoscopy;  Laterality: N/A;  . Exploratory laparotomy with lysis of adhesions  1980  . FLEXIBLE SIGMOIDOSCOPY  08/2011   Per Dr. Randa EvensEdwards. Normal study. Random biopsies positive for trace melanosis coli.  . Surgical pinning  1996   Foot  . TOTAL ABDOMINAL HYSTERECTOMY W/ BILATERAL SALPINGOOPHORECTOMY  1980    Family History  Problem Relation Age of Onset  . Alcohol abuse Mother     Social History:  reports that she quit smoking about 30 years ago. She has never used smokeless tobacco. She reports that she does not drink alcohol or use drugs.  Allergies:  Allergies  Allergen Reactions  . Aspirin Other (See Comments)    "makes my stomach hurt"  . Codeine Other (See Comments)    "i get irritated"    Medications: Scheduled:  BMP Latest Ref Rng & Units 10/23/2016 08/13/2016 03/21/2015  Glucose 65 - 99 mg/dL 161(W102(H) 960(A109(H) 97  BUN 6 - 20 mg/dL 54(U89(H) 98(J78(H) 19(J40(H)  Creatinine 0.44 -  1.00 mg/dL 16.10(R10.61(H) 6.04(V8.38(H) 4.09(W5.58(H)  Sodium 135 - 145 mmol/L 137 138 140  Potassium 3.5 - 5.1 mmol/L 3.6 4.1 4.7  Chloride 101 - 111 mmol/L 112(H) 105 111  CO2 22 - 32 mmol/L 13(L) 19(L) 19  Calcium 8.9 - 10.3 mg/dL 1.1(B8.7(L) 9.1 8.8   CBC Latest Ref Rng & Units 10/23/2016 08/13/2016 03/21/2015  WBC 4.0 - 10.5 K/uL 6.3 13.8(H) 8.6  Hemoglobin 12.0 - 15.0 g/dL 1.4(N8.9(L) 8.2(N9.3(L) 10.4(L)  Hematocrit 36.0 - 46.0 % 26.8(L) 28.5(L) 32.5(L)  Platelets 150  - 400 K/uL 337 292 204     No results found.  Review of Systems  Constitutional: Positive for malaise/fatigue and weight loss. Negative for chills and fever.  HENT: Negative.   Eyes: Negative.   Respiratory: Negative.   Cardiovascular: Positive for leg swelling. Negative for palpitations, orthopnea and claudication.  Gastrointestinal: Positive for nausea. Negative for abdominal pain, diarrhea, heartburn and vomiting.  Genitourinary: Positive for frequency.       Chronic urinary frequency and incontinence  Musculoskeletal: Positive for back pain.       Reports some intermittent back pain  Skin: Negative.   Neurological: Positive for weakness. Negative for dizziness and headaches.  Psychiatric/Behavioral: The patient is nervous/anxious.    Blood pressure 143/69, pulse 72, temperature 98.1 F (36.7 C), temperature source Oral, resp. rate 15, height 5\' 1"  (1.549 m), weight 55.8 kg (123 lb), SpO2 100 %. Physical Exam  Nursing note and vitals reviewed. Constitutional: She is oriented to person, place, and time. She appears well-developed and well-nourished. No distress.  HENT:  Head: Normocephalic and atraumatic.  Nose: Nose normal.  Mouth/Throat: No oropharyngeal exudate.  Eyes: Conjunctivae and EOM are normal. Pupils are equal, round, and reactive to light. No scleral icterus.  Neck: Normal range of motion. Neck supple. No JVD present.  Cardiovascular: Normal rate, regular rhythm and normal heart sounds.  Exam reveals no friction rub.   No murmur heard. Respiratory: Effort normal and breath sounds normal. She has no wheezes. She has no rales.  GI: Soft. Bowel sounds are normal. There is no tenderness. There is no rebound.  Musculoskeletal: Normal range of motion. She exhibits edema. She exhibits no tenderness.  Trace bilateral ankle edema  Neurological: She is alert and oriented to person, place, and time.  No asterixis  Skin: Skin is warm and dry. No rash noted. No erythema.   Psychiatric: She has a normal mood and affect.    Assessment/Plan: 1. Failure to thrive in patient with progressive chronic kidney disease stage V: After extensive discussion with the patient, she is willing to begin chronic/maintenance hemodialysis understanding that this will be a lifelong therapy. She informs me that she will need additional help with regards to transportation to dialysis that can be arranged after she begins this. 2. Chronic kidney disease stage V with subtle uremic symptoms: Begin hemodialysis starting tomorrow via her left brachiocephalic fistula. No acute electrolyte abnormalities or lower uremic symptoms or signs are recognized at this point. Ideally, this could have been done as an outpatient however given her limited social support-concerns were raised about her ability to follow instructions pertaining to initiation of hemodialysis. 3. Anemia of chronic kidney disease: Check iron studies and begin ESA. 4. Hypertension: Resume antihypertensive therapy and monitor with ultrafiltration and hemodialysis. 5. Secondary hyperparathyroidism: We'll check a phosphorus and PTH level to address potential targets of therapy.   Erikson Danzy K. 10/23/2016, 2:51 PM

## 2016-10-23 NOTE — ED Triage Notes (Signed)
Pt was told that she is in "critical renal failure" by her MD this morning and was told to come to ED. Pt has not started dialysis yet but has a graft in left arm.

## 2016-10-24 DIAGNOSIS — D631 Anemia in chronic kidney disease: Secondary | ICD-10-CM

## 2016-10-24 LAB — RENAL FUNCTION PANEL
ALBUMIN: 3 g/dL — AB (ref 3.5–5.0)
ANION GAP: 15 (ref 5–15)
BUN: 89 mg/dL — AB (ref 6–20)
CHLORIDE: 109 mmol/L (ref 101–111)
CO2: 10 mmol/L — ABNORMAL LOW (ref 22–32)
Calcium: 8 mg/dL — ABNORMAL LOW (ref 8.9–10.3)
Creatinine, Ser: 10.65 mg/dL — ABNORMAL HIGH (ref 0.44–1.00)
GFR, EST AFRICAN AMERICAN: 3 mL/min — AB (ref >60)
GFR, EST NON AFRICAN AMERICAN: 3 mL/min — AB (ref >60)
GLUCOSE: 89 mg/dL (ref 65–99)
POTASSIUM: 3 mmol/L — AB (ref 3.5–5.1)
Phosphorus: 7.1 mg/dL — ABNORMAL HIGH (ref 2.5–4.6)
SODIUM: 134 mmol/L — AB (ref 135–145)

## 2016-10-24 LAB — MRSA PCR SCREENING: MRSA BY PCR: POSITIVE — AB

## 2016-10-24 LAB — CBC
HCT: 21.5 % — ABNORMAL LOW (ref 36.0–46.0)
Hemoglobin: 7.2 g/dL — ABNORMAL LOW (ref 12.0–15.0)
MCH: 27.5 pg (ref 26.0–34.0)
MCHC: 33.5 g/dL (ref 30.0–36.0)
MCV: 82.1 fL (ref 78.0–100.0)
PLATELETS: 304 10*3/uL (ref 150–400)
RBC: 2.62 MIL/uL — AB (ref 3.87–5.11)
RDW: 16.1 % — ABNORMAL HIGH (ref 11.5–15.5)
WBC: 6.2 10*3/uL (ref 4.0–10.5)

## 2016-10-24 LAB — HEPATITIS B SURFACE ANTIGEN
Hepatitis B Surface Ag: NEGATIVE
Hepatitis B Surface Ag: NEGATIVE

## 2016-10-24 LAB — HEPATITIS B SURFACE ANTIBODY,QUALITATIVE: Hep B S Ab: NONREACTIVE

## 2016-10-24 LAB — PARATHYROID HORMONE, INTACT (NO CA): PTH: 453 pg/mL — ABNORMAL HIGH (ref 15–65)

## 2016-10-24 MED ORDER — MUPIROCIN 2 % EX OINT
1.0000 "application " | TOPICAL_OINTMENT | Freq: Two times a day (BID) | CUTANEOUS | Status: DC
  Administered 2016-10-24 – 2016-10-28 (×9): 1 via NASAL
  Filled 2016-10-24 (×2): qty 22

## 2016-10-24 MED ORDER — CHLORHEXIDINE GLUCONATE CLOTH 2 % EX PADS
6.0000 | MEDICATED_PAD | Freq: Every day | CUTANEOUS | Status: DC
  Administered 2016-10-25 – 2016-10-28 (×4): 6 via TOPICAL

## 2016-10-24 MED ORDER — HYDRALAZINE HCL 20 MG/ML IJ SOLN: 5.0000 mg | Freq: Four times a day (QID) | INTRAMUSCULAR | Status: DC | PRN

## 2016-10-24 MED ORDER — METOPROLOL TARTRATE 25 MG PO TABS
25.0000 mg | ORAL_TABLET | Freq: Two times a day (BID) | ORAL | Status: DC
  Administered 2016-10-24 – 2016-10-28 (×6): 25 mg via ORAL
  Filled 2016-10-24 (×7): qty 1

## 2016-10-24 MED ORDER — METHOCARBAMOL 500 MG PO TABS
500.0000 mg | ORAL_TABLET | Freq: Three times a day (TID) | ORAL | Status: DC | PRN
  Administered 2016-10-24 – 2016-10-27 (×6): 500 mg via ORAL
  Filled 2016-10-24 (×6): qty 1

## 2016-10-24 MED ORDER — DARBEPOETIN ALFA 60 MCG/0.3ML IJ SOSY
PREFILLED_SYRINGE | INTRAMUSCULAR | Status: AC
  Administered 2016-10-24: 60 ug via INTRAVENOUS
  Filled 2016-10-24: qty 0.3

## 2016-10-24 MED ORDER — NEPRO/CARBSTEADY PO LIQD
237.0000 mL | Freq: Two times a day (BID) | ORAL | Status: DC
  Administered 2016-10-24 – 2016-10-27 (×5): 237 mL via ORAL

## 2016-10-24 NOTE — Progress Notes (Signed)
Initial Nutrition Assessment  DOCUMENTATION CODES:   Severe malnutrition in context of chronic illness  INTERVENTION:  Provide Nepro Shake po BID, each supplement provides 425 kcal and 19 grams protein.  Encourage adequate PO intake.   NUTRITION DIAGNOSIS:   Malnutrition related to chronic illness as evidenced by percent weight loss, energy intake < or equal to 75% for > or equal to 1 month.  GOAL:   Patient will meet greater than or equal to 90% of their needs  MONITOR:   PO intake, Supplement acceptance, Labs, Weight trends, Skin, I & O's  REASON FOR ASSESSMENT:   Consult Assessment of nutrition requirement/status  ASSESSMENT:   82 y.o. female with medical history significant for advanced chronic kidney disease currently at stage V, progressive anemia secondary to renal disease, prior GI bleed, hypertension, paroxysmal atrial fibrillation and dementia. Patient had routine labs completed that revealed worsening of her renal function with creatinine up to 10.61 and BUN greater than 80. She was sent to the ER for further evaluation and treatment. She reports excessive urinary output and she attributes this to her daily Lasix dosing. She has not had any respiratory symptoms. She has not had any blood in urine or stool. She does report unintentional weight loss over unknown period time from 180 pounds to 123 pounds despite her reports eating well.  Pt reports having a decreased appetite and was only able to consuming applesauce and some broth this AM at breakfast. Pt reports poor po intake at home which has been ongoing for more than 3 months. Pt reports usual intake of 1 and a half meals which mostly consists of soup (cream of chicken) as she reports having poor dentition. Pt reports weight loss. Per Epic weight records, pt with a 25% weight loss in 11 months. Pt is agreeable to nutritional supplements to aid in caloric and protein needs. RD to order. Pt encouraged to eat her foods at  meals.   Nutrition-Focused physical exam completed. Findings are moderate fat depletion, moderate muscle depletion, and mild edema.   Labs and medications reviewed. Phosphorous elevated at 7.1. Potassium low at 3.0.  Diet Order:  Diet renal with fluid restriction Fluid restriction: 1200 mL Fluid; Room service appropriate? Yes; Fluid consistency: Thin  Skin:  Reviewed, no issues  Last BM:  11/17  Height:   Ht Readings from Last 1 Encounters:  10/23/16 5\' 1"  (1.549 m)    Weight:   Wt Readings from Last 1 Encounters:  10/24/16 125 lb 10.6 oz (57 kg)    Ideal Body Weight:  47.7 kg  BMI:  Body mass index is 23.74 kg/m.  Estimated Nutritional Needs:   Kcal:  1550-1800  Protein:  70-85 grams  Fluid:  1.2 L/day  EDUCATION NEEDS:   No education needs identified at this time  Roslyn SmilingStephanie Nyssa Sayegh, MS, RD, LDN Pager # [phone] After hours/ weekend pager # [phone]
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
## 2016-10-24 NOTE — Progress Notes (Signed)
PROGRESS NOTE  Monica LimesMary S [name]  ZOX:[id number] DOB: 12/17/1931 DOA: 10/23/2016 PCP: Billee CashingMCKENZIE, WAYLAND, MD Outpatient Specialists:  Subjective: Seen after she had her dialysis, feeling very weak, PT to evaluate. Had denies any shortness of breath, fever or chills.  Brief Narrative:  Monica LimesMary S Mayr is a 82 y.o. female with medical history significant for advanced chronic kidney disease currently at stage V, progressive anemia secondary to renal disease, prior GI bleed, hypertension, paroxysmal atrial fibrillation and dementia. Patient had routine labs completed that revealed worsening of her renal function with creatinine up to 10.61 and BUN greater than 80. She was sent to the ER for further evaluation and treatment. She reports excessive urinary output and she attributes this to her daily Lasix dosing. She has not had any respiratory symptoms. She has not had any blood in urine or stool. She does report unintentional weight loss over unknown period time from 180 pounds to 123 pounds despite her reports eating well.  Assessment & Plan:   Principal Problem:   Uremia Active Problems:   HTN (hypertension)   PAF (paroxysmal atrial fibrillation) (HCC)   CKD (chronic kidney disease) stage 5, GFR less than 15 ml/min (HCC)   Dementia   Anemia, chronic renal failure, stage 5 (HCC)   Unintentional weight loss     Uremia/ CKD (chronic kidney disease) stage 5, GFR less than 15 ml/min  -Patient presents to ER with abnormal labs with progressive uremia and chronic kidney disease with likely initiation of dialysis during this admission -Patient reports excessive urinary output -No evidence of pulmonary edema on admission. -Left upper extremity AV fistula previously placed in April 2016. -Had her first dialysis earlier today, the clip process.    Unintentional weight loss -Likely from combination of reported dementia and chronic kidney disease- the patient also reporting that she eats well -In review  of weight trends in the EMR her weight was around 170 pounds in November 2016 and currently is 123 pounds -Pre-albumin -Nutritional consultation    HTN (hypertension) -Blood pressure controlled -Continue preadmission Norvasc and Lopressor    Anemia, chronic renal failure, stage 5  -Suspect primary cause of anemia is related to chronic kidney disease -With unexplained weight loss check stools for blood to rule out possible GI malignancy-patient had colonoscopy in 2010 at Dr. Kenna GilbertMann's office; in 2015 she had EGD that revealed a prepyloric ulcer that was H. pylori negative with repeat EGD 2 months later showed 90% healing -Check TSH -Anemia panel -Follow hemoglobin trends   Reported history of PAF (paroxysmal atrial fibrillation)  -Currently maintaining sinus rhythm -Continue Lopressor -Not on anticoagulation secondary to history of GI bleed and remote alcoholism in the 1980s   ? Dementia -Listed in medical problems and patient on Aricept prior to admission -Does have remote history of alcoholism -Currently patient is alert and oriented 4 -Social work consulted as was PT/OT to determine appropriateness for discharge back to home environment alone    DVT prophylaxis: SQ Heparin Code Status: Full Code Family Communication:  Disposition Plan:  Diet: Diet renal with fluid restriction Fluid restriction: 1200 mL Fluid; Room service appropriate? Yes; Fluid consistency: Thin  Consultants:   Nephrology  Procedures:   None  Antimicrobials:   None  Objective: Vitals:   10/24/16 0800 10/24/16 0827 10/24/16 0921 10/24/16 1000  BP: (!) 112/52 106/62 102/62 (!) 94/55  Pulse: 77 76    Resp: 15 14 18    Temp:  97.2 F (36.2 C) 97.6 F (36.4 C)   TempSrc:  Oral Oral   SpO2:  98% 96%   Weight:  57 kg (125 lb 10.6 oz)    Height:        Intake/Output Summary (Last 24 hours) at 10/24/16 1408 Last data filed at 10/24/16 0900  Gross per 24 hour  Intake              300 ml    Output              700 ml  Net             -400 ml   Filed Weights   10/23/16 1030 10/24/16 0620 10/24/16 0827  Weight: 55.8 kg (123 lb) 58 kg (127 lb 13.9 oz) 57 kg (125 lb 10.6 oz)    Examination: General exam: Appears calm and comfortable  Respiratory system: Clear to auscultation. Respiratory effort normal. Cardiovascular system: S1 & S2 heard, RRR. No JVD, murmurs, rubs, gallops or clicks. No pedal edema. Gastrointestinal system: Abdomen is nondistended, soft and nontender. No organomegaly or masses felt. Normal bowel sounds heard. Central nervous system: Alert and oriented. No focal neurological deficits. Extremities: Symmetric 5 x 5 power. Skin: No rashes, lesions or ulcers Psychiatry: Judgement and insight appear normal. Mood & affect appropriate.   Data Reviewed: I have personally reviewed following labs and imaging studies  CBC:  Recent Labs Lab 10/23/16 1104 10/24/16 0447  WBC 6.3 6.2  HGB 8.9* 7.2*  HCT 26.8* 21.5*  MCV 82.7 82.1  PLT 337 304   Basic Metabolic Panel:  Recent Labs Lab 10/23/16 1104 10/23/16 1650 10/23/16 1919 10/24/16 0447  NA 137  --   --  134*  K 3.6  --   --  3.0*  CL 112*  --   --  109  CO2 13*  --   --  10*  GLUCOSE 102*  --   --  89  BUN 89*  --   --  89*  CREATININE 10.61*  --   --  10.65*  CALCIUM 8.7*  --   --  8.0*  MG  --  2.0  --   --   PHOS  --  6.9* 6.4* 7.1*   GFR: Estimated Creatinine Clearance: 3.1 mL/min (by C-G formula based on SCr of 10.65 mg/dL (H)). Liver Function Tests:  Recent Labs Lab 10/24/16 0447  ALBUMIN 3.0*   No results for input(s): LIPASE, AMYLASE in the last 168 hours. No results for input(s): AMMONIA in the last 168 hours. Coagulation Profile: No results for input(s): INR, PROTIME in the last 168 hours. Cardiac Enzymes: No results for input(s): CKTOTAL, CKMB, CKMBINDEX, TROPONINI in the last 168 hours. BNP (last 3 results) No results for input(s): PROBNP in the last 8760  hours. HbA1C: No results for input(s): HGBA1C in the last 72 hours. CBG: No results for input(s): GLUCAP in the last 168 hours. Lipid Profile: No results for input(s): CHOL, HDL, LDLCALC, TRIG, CHOLHDL, LDLDIRECT in the last 72 hours. Thyroid Function Tests:  Recent Labs  10/23/16 1405  TSH 1.356   Anemia Panel:  Recent Labs  10/23/16 1405  VITAMINB12 966*  FOLATE 9.9  FERRITIN 159  TIBC 231*  IRON 74  RETICCTPCT 5.0*   Urine analysis:    Component Value Date/Time   COLORURINE YELLOW 10/23/2016 1619   APPEARANCEUR CLOUDY (A) 10/23/2016 1619   LABSPEC 1.012 10/23/2016 1619   PHURINE 5.0 10/23/2016 1619   GLUCOSEU NEGATIVE 10/23/2016 1619   HGBUR SMALL (A) 10/23/2016 1619  BILIRUBINUR NEGATIVE 10/23/2016 1619   KETONESUR NEGATIVE 10/23/2016 1619   PROTEINUR 100 (A) 10/23/2016 1619   UROBILINOGEN 0.2 03/15/2015 1510   NITRITE NEGATIVE 10/23/2016 1619   LEUKOCYTESUR SMALL (A) 10/23/2016 1619   Sepsis Labs: @LABRCNTIP (procalcitonin:4,lacticidven:4)  ) Recent Results (from the past 240 hour(s))  MRSA PCR Screening     Status: Abnormal   Collection Time: 10/24/16  4:24 AM  Result Value Ref Range Status   MRSA by PCR POSITIVE (A) NEGATIVE Final    Comment:        The GeneXpert MRSA Assay (FDA approved for NASAL specimens only), is one component of a comprehensive MRSA colonization surveillance program. It is not intended to diagnose MRSA infection nor to guide or monitor treatment for MRSA infections. RESULT CALLED TO, READ BACK BY AND VERIFIED WITH: J NARAMDAS,RN AT 1610 10/24/16 BY L BENFIELD      Invalid input(s): PROCALCITONIN, LACTICACIDVEN   Radiology Studies: No results found.      Scheduled Meds: . ALPRAZolam  0.5 mg Oral BID  . amLODipine  10 mg Oral Daily  . Chlorhexidine Gluconate Cloth  6 each Topical Q0600  . darbepoetin (ARANESP) injection - DIALYSIS  60 mcg Intravenous Q Fri-HD  . donepezil  10 mg Oral QHS  . heparin  5,000  Units Subcutaneous Q8H  . metoprolol tartrate  25 mg Oral BID  . mupirocin ointment  1 application Nasal BID  . tetrahydrozoline  2 drop Both Eyes QID   Continuous Infusions:   LOS: 1 day    Time spent: 35 minutes    Kaylinn Dedic A, MD [facility] Pager [phone]  If 7PM-7AM, please contact night-coverage [email or web address] Password [email or web address] 10/24/2016, 2:08 PM

## 2016-10-24 NOTE — Progress Notes (Signed)
  [facility] Progress Note   Assessment/ Plan:   1. Failure to thrive in patient with progressive chronic kidney disease stage V--> ESRD: After extensive discussion with the patient, she is willing to begin chronic/maintenance hemodialysis understanding that this will be a lifelong therapy. She informs me that she will need additional help with regards to transportation to dialysis that can be arranged after she begins this. 2. Chronic kidney disease stage V with subtle uremic symptoms: Tolerated HD today.  No acute electrolyte abnormalities or lower uremic symptoms or signs are recognized at this point. Ideally, this could have been done as an outpatient however given her limited social support-concerns were raised about her ability to follow instructions pertaining to initiation of hemodialysis. 3. Anemia of chronic kidney disease: Check iron studies and begin ESA--> aranesp 4. Hypertension: Resume antihypertensive therapy and monitor with ultrafiltration and hemodialysis. 5. Secondary hyperparathyroidism: We'll check a phosphorus and PTH level to address potential targets of therapy.  Subjective:    Tolerated HD today.  Fistula worked well.     Objective:   BP (!) 94/55   Pulse 76   Temp 97.6 F (36.4 C) (Oral)   Resp 18   Ht 5\' 1"  (1.549 m)   Wt 57 kg (125 lb 10.6 oz)   SpO2 96%   BMI 23.74 kg/m   Physical Exam: ZOX:WRUEAVWGen:elderly, lying in bed CVS:RRR Resp:clear Abd: soft Ext: no LE edema ACCESS: LUE AVF with good bruit and thrill  Labs: BMET  Recent Labs Lab 10/23/16 1104 10/23/16 1650 10/23/16 1919 10/24/16 0447  NA 137  --   --  134*  K 3.6  --   --  3.0*  CL 112*  --   --  109  CO2 13*  --   --  10*  GLUCOSE 102*  --   --  89  BUN 89*  --   --  89*  CREATININE 10.61*  --   --  10.65*  CALCIUM 8.7*  --   --  8.0*  PHOS  --  6.9* 6.4* 7.1*   CBC  Recent Labs Lab 10/23/16 1104 10/24/16 0447  WBC 6.3 6.2  HGB 8.9* 7.2*  HCT 26.8* 21.5*  MCV  82.7 82.1  PLT 337 304    @IMGRELPRIORS @ Medications:    . ALPRAZolam  0.5 mg Oral BID  . Chlorhexidine Gluconate Cloth  6 each Topical Q0600  . darbepoetin (ARANESP) injection - DIALYSIS  60 mcg Intravenous Q Fri-HD  . donepezil  10 mg Oral QHS  . feeding supplement (NEPRO CARB STEADY)  237 mL Oral BID BM  . heparin  5,000 Units Subcutaneous Q8H  . metoprolol tartrate  25 mg Oral BID  . mupirocin ointment  1 application Nasal BID  . tetrahydrozoline  2 drop Both Eyes QID     Bufford ButtnerElizabeth Azalea Cedar MD [facility] Cell [phone] pgr [phone] 10/24/2016, 4:34 PM

## 2016-10-25 DIAGNOSIS — E43 Unspecified severe protein-calorie malnutrition: Secondary | ICD-10-CM

## 2016-10-25 HISTORY — DX: Unspecified severe protein-calorie malnutrition: E43

## 2016-10-25 LAB — BASIC METABOLIC PANEL
Anion gap: 12 (ref 5–15)
BUN: 53 mg/dL — AB (ref 6–20)
CALCIUM: 7.9 mg/dL — AB (ref 8.9–10.3)
CO2: 21 mmol/L — ABNORMAL LOW (ref 22–32)
Chloride: 101 mmol/L (ref 101–111)
Creatinine, Ser: 7.45 mg/dL — ABNORMAL HIGH (ref 0.44–1.00)
GFR calc Af Amer: 5 mL/min — ABNORMAL LOW (ref >60)
GFR, EST NON AFRICAN AMERICAN: 5 mL/min — AB (ref >60)
GLUCOSE: 94 mg/dL (ref 65–99)
Potassium: 2.5 mmol/L — CL (ref 3.5–5.1)
Sodium: 134 mmol/L — ABNORMAL LOW (ref 135–145)

## 2016-10-25 LAB — HEPATITIS B CORE ANTIBODY, TOTAL: HEP B C TOTAL AB: NEGATIVE

## 2016-10-25 LAB — HEPATITIS B SURFACE ANTIBODY,QUALITATIVE: HEP B S AB: NONREACTIVE

## 2016-10-25 MED ORDER — POTASSIUM CHLORIDE CRYS ER 20 MEQ PO TBCR
40.0000 meq | EXTENDED_RELEASE_TABLET | Freq: Once | ORAL | Status: AC
  Administered 2016-10-25: 40 meq via ORAL
  Filled 2016-10-25: qty 2

## 2016-10-25 MED ORDER — CALCIUM ACETATE (PHOS BINDER) 667 MG PO CAPS
667.0000 mg | ORAL_CAPSULE | Freq: Three times a day (TID) | ORAL | Status: DC
  Administered 2016-10-25 – 2016-10-28 (×7): 667 mg via ORAL
  Filled 2016-10-25 (×6): qty 1

## 2016-10-25 NOTE — Progress Notes (Signed)
  [facility] Progress Note   Assessment/ Plan:   1. Failure to thrive in patient with progressive chronic kidney disease stage V--> ESRD: After extensive discussion with the patient, she is willing to begin chronic/maintenance hemodialysis understanding that this will be a lifelong therapy. She informs me that she will need additional help with regards to transportation to dialysis that can be arranged after she begins this. 2. Chronic kidney disease stage V with subtle uremic symptoms: Tolerated HD today.  No acute electrolyte abnormalities or lower uremic symptoms or signs are recognized at this point. Ideally, this could have been done as an outpatient however given her limited social support-concerns were raised about her ability to follow instructions pertaining to initiation of hemodialysis. 3. Anemia of chronic kidney disease: Check iron studies and begin ESA--> aranesp Q weekly, 11/17 start.  Doesn't need iron 4. Hypertension: Resume antihypertensive therapy and monitor with ultrafiltration and hemodialysis. 5. Secondary hyperparathyroidism: We'll check a phosphorus and PTH level to address potential targets of therapy.  Phos 7.1, Ca acetate TID AC  Subjective:    Tolerated HD today.  Fistula worked well.     Objective:   BP (!) 114/56 (BP Location: Right Arm)   Pulse 91   Temp 99.2 F (37.3 C) (Oral)   Resp 17   Ht 5\' 1"  (1.549 m)   Wt 57.6 kg (126 lb 15.8 oz)   SpO2 100%   BMI 23.99 kg/m   Physical Exam: JXB:[id number]:elderly, lying in bed CVS:RRR Resp:clear Abd: soft Ext: no LE edema ACCESS: LUE AVF with good bruit and thrill  Labs: BMET  Recent Labs Lab 10/23/16 1104 10/23/16 1650 10/23/16 1919 10/24/16 0447 10/25/16 0615  NA 137  --   --  134* 134*  K 3.6  --   --  3.0* 2.5*  CL 112*  --   --  109 101  CO2 13*  --   --  10* 21*  GLUCOSE 102*  --   --  89 94  BUN 89*  --   --  89* 53*  CREATININE 10.61*  --   --  10.65* 7.45*  CALCIUM 8.7*  --   --   8.0* 7.9*  PHOS  --  6.9* 6.4* 7.1*  --    CBC  Recent Labs Lab 10/23/16 1104 10/24/16 0447  WBC 6.3 6.2  HGB 8.9* 7.2*  HCT 26.8* 21.5*  MCV 82.7 82.1  PLT 337 304    @IMGRELPRIORS @ Medications:    . ALPRAZolam  0.5 mg Oral BID  . Chlorhexidine Gluconate Cloth  6 each Topical Q0600  . darbepoetin (ARANESP) injection - DIALYSIS  60 mcg Intravenous Q Fri-HD  . donepezil  10 mg Oral QHS  . feeding supplement (NEPRO CARB STEADY)  237 mL Oral BID BM  . heparin  5,000 Units Subcutaneous Q8H  . metoprolol tartrate  25 mg Oral BID  . mupirocin ointment  1 application Nasal BID  . tetrahydrozoline  2 drop Both Eyes QID     Bufford ButtnerElizabeth Jarrett Albor MD [facility] Cell [phone] 10/25/2016, 2:34 PM

## 2016-10-25 NOTE — Progress Notes (Signed)
PROGRESS NOTE  Monica LimesMary S Rodin  VFI:[id number] DOB: [birth year] DOA: 10/23/2016 PCP: Billee CashingMCKENZIE, WAYLAND, MD Outpatient Specialists:  Subjective: Potassium is 2.5 this morning patient was complaining about some leg cramps. Received 40 mEq of oral potassium. CSW consulted for SNF placement  Brief Narrative:  Monica [name] is a 82 y.o. female with medical history significant for advanced chronic kidney disease currently at stage V, progressive anemia secondary to renal disease, prior GI bleed, hypertension, paroxysmal atrial fibrillation and dementia. Patient had routine labs completed that revealed worsening of her renal function with creatinine up to 10.61 and BUN greater than 80. She was sent to the ER for further evaluation and treatment. She reports excessive urinary output and she attributes this to her daily Lasix dosing. She has not had any respiratory symptoms. She has not had any blood in urine or stool. She does report unintentional weight loss over unknown period time from 180 pounds to 123 pounds despite her reports eating well.  Assessment & Plan:   Principal Problem:   Uremia Active Problems:   HTN (hypertension)   PAF (paroxysmal atrial fibrillation) (HCC)   CKD (chronic kidney disease) stage 5, GFR less than 15 ml/min (HCC)   Dementia   Anemia, chronic renal failure, stage 5 (HCC)   Unintentional weight loss   Protein-calorie malnutrition, severe     Uremia/ CKD (chronic kidney disease) stage 5, GFR less than 15 ml/min  -Patient presents to ER with abnormal labs with progressive uremia and chronic kidney disease with likely initiation of dialysis during this admission -Patient reports excessive urinary output -No evidence of pulmonary edema on admission. -Left upper extremity AV fistula previously placed in April 2016. -Had her first dialysis yesterday 11/18, her second dialysis today, likely can be discharged on Monday morning.    Unintentional weight loss -Likely from  combination of reported dementia and chronic kidney disease- the patient also reporting that she eats well -In review of weight trends in the EMR her weight was around 170 pounds in November 2016 and currently is 123 pounds -Pre-albumin -Nutritional consultation    HTN (hypertension) -Blood pressure controlled -Continue his Lopressor and discontinued preadmission Norvasc    Anemia, chronic renal failure, stage 5  -Suspect primary cause of anemia is related to chronic kidney disease -With unexplained weight loss check stools for blood to rule out possible GI malignancy-patient had colonoscopy in 2010 at Dr. Kenna GilbertMann's office; in 2015 she had EGD that revealed a prepyloric ulcer that was H. pylori negative with repeat EGD 2 months later showed 90% healing -Check TSH -Anemia panel -Follow hemoglobin trends   Reported history of PAF (paroxysmal atrial fibrillation)  -Currently maintaining sinus rhythm -Continue Lopressor -Not on anticoagulation secondary to history of GI bleed and remote alcoholism in the 1980s   ? Dementia -Listed in medical problems and patient on Aricept prior to admission -Does have remote history of alcoholism -Currently patient is alert and oriented 4 -PT recommended SNF, CSW to help with placement.   Hypokalemia -Potassium 2.5, replete orally, nephrology for 4K bath.  DVT prophylaxis: SQ Heparin Code Status: Full Code Family Communication:  Disposition Plan:  Diet: Diet renal with fluid restriction Fluid restriction: 1200 mL Fluid; Room service appropriate? Yes; Fluid consistency: Thin  Consultants:   Nephrology  Procedures:   None  Antimicrobials:   None  Objective: Vitals:   10/24/16 1656 10/24/16 2041 10/25/16 0554 10/25/16 0939  BP: 92/62 (!) 101/53 108/60 (!) 114/56  Pulse: 84 87 71 91  Resp:  18 18 18 17   Temp: 98.6 F (37 C) 99.2 F (37.3 C) 98.6 F (37 C) 99.2 F (37.3 C)  TempSrc: Oral   Oral  SpO2: 100% 100% 100% 100%  Weight:   57.6 kg (126 lb 15.8 oz)    Height:        Intake/Output Summary (Last 24 hours) at 10/25/16 1119 Last data filed at 10/25/16 1048  Gross per 24 hour  Intake              360 ml  Output              350 ml  Net               10 ml   Filed Weights   10/24/16 0620 10/24/16 0827 10/24/16 2041  Weight: 58 kg (127 lb 13.9 oz) 57 kg (125 lb 10.6 oz) 57.6 kg (126 lb 15.8 oz)    Examination: General exam: Appears calm and comfortable  Respiratory system: Clear to auscultation. Respiratory effort normal. Cardiovascular system: S1 & S2 heard, RRR. No JVD, murmurs, rubs, gallops or clicks. No pedal edema. Gastrointestinal system: Abdomen is nondistended, soft and nontender. No organomegaly or masses felt. Normal bowel sounds heard. Central nervous system: Alert and oriented. No focal neurological deficits. Extremities: Symmetric 5 x 5 power. Skin: No rashes, lesions or ulcers Psychiatry: Judgement and insight appear normal. Mood & affect appropriate.   Data Reviewed: I have personally reviewed following labs and imaging studies  CBC:  Recent Labs Lab 10/23/16 1104 10/24/16 0447  WBC 6.3 6.2  HGB 8.9* 7.2*  HCT 26.8* 21.5*  MCV 82.7 82.1  PLT 337 304   Basic Metabolic Panel:  Recent Labs Lab 10/23/16 1104 10/23/16 1650 10/23/16 1919 10/24/16 0447 10/25/16 0615  NA 137  --   --  134* 134*  K 3.6  --   --  3.0* 2.5*  CL 112*  --   --  109 101  CO2 13*  --   --  10* 21*  GLUCOSE 102*  --   --  89 94  BUN 89*  --   --  89* 53*  CREATININE 10.61*  --   --  10.65* 7.45*  CALCIUM 8.7*  --   --  8.0* 7.9*  MG  --  2.0  --   --   --   PHOS  --  6.9* 6.4* 7.1*  --    GFR: Estimated Creatinine Clearance: 4.8 mL/min (by C-G formula based on SCr of 7.45 mg/dL (H)). Liver Function Tests:  Recent Labs Lab 10/24/16 0447  ALBUMIN 3.0*   No results for input(s): LIPASE, AMYLASE in the last 168 hours. No results for input(s): AMMONIA in the last 168 hours. Coagulation  Profile: No results for input(s): INR, PROTIME in the last 168 hours. Cardiac Enzymes: No results for input(s): CKTOTAL, CKMB, CKMBINDEX, TROPONINI in the last 168 hours. BNP (last 3 results) No results for input(s): PROBNP in the last 8760 hours. HbA1C: No results for input(s): HGBA1C in the last 72 hours. CBG: No results for input(s): GLUCAP in the last 168 hours. Lipid Profile: No results for input(s): CHOL, HDL, LDLCALC, TRIG, CHOLHDL, LDLDIRECT in the last 72 hours. Thyroid Function Tests:  Recent Labs  10/23/16 1405  TSH 1.356   Anemia Panel:  Recent Labs  10/23/16 1405  VITAMINB12 966*  FOLATE 9.9  FERRITIN 159  TIBC 231*  IRON 74  RETICCTPCT 5.0*   Urine analysis:  Component Value Date/Time   COLORURINE YELLOW 10/23/2016 1619   APPEARANCEUR CLOUDY (A) 10/23/2016 1619   LABSPEC 1.012 10/23/2016 1619   PHURINE 5.0 10/23/2016 1619   GLUCOSEU NEGATIVE 10/23/2016 1619   HGBUR SMALL (A) 10/23/2016 1619   BILIRUBINUR NEGATIVE 10/23/2016 1619   KETONESUR NEGATIVE 10/23/2016 1619   PROTEINUR 100 (A) 10/23/2016 1619   UROBILINOGEN 0.2 03/15/2015 1510   NITRITE NEGATIVE 10/23/2016 1619   LEUKOCYTESUR SMALL (A) 10/23/2016 1619   Sepsis Labs: @LABRCNTIP (procalcitonin:4,lacticidven:4)  ) Recent Results (from the past 240 hour(s))  MRSA PCR Screening     Status: Abnormal   Collection Time: 10/24/16  4:24 AM  Result Value Ref Range Status   MRSA by PCR POSITIVE (A) NEGATIVE Final    Comment:        The GeneXpert MRSA Assay (FDA approved for NASAL specimens only), is one component of a comprehensive MRSA colonization surveillance program. It is not intended to diagnose MRSA infection nor to guide or monitor treatment for MRSA infections. RESULT CALLED TO, READ BACK BY AND VERIFIED WITH: J NARAMDAS,RN AT 1610 10/24/16 BY L BENFIELD      Invalid input(s): PROCALCITONIN, LACTICACIDVEN   Radiology Studies: No results found.      Scheduled Meds: .  ALPRAZolam  0.5 mg Oral BID  . Chlorhexidine Gluconate Cloth  6 each Topical Q0600  . darbepoetin (ARANESP) injection - DIALYSIS  60 mcg Intravenous Q Fri-HD  . donepezil  10 mg Oral QHS  . feeding supplement (NEPRO CARB STEADY)  237 mL Oral BID BM  . heparin  5,000 Units Subcutaneous Q8H  . metoprolol tartrate  25 mg Oral BID  . mupirocin ointment  1 application Nasal BID  . tetrahydrozoline  2 drop Both Eyes QID   Continuous Infusions:   LOS: 2 days    Time spent: 35 minutes    Bethenny Losee A, MD [facility] Pager [phone]  If 7PM-7AM, please contact night-coverage [email or web address] Password [email or web address] 10/25/2016, 11:19 AM

## 2016-10-26 LAB — CBC
HEMATOCRIT: 23.1 % — AB (ref 36.0–46.0)
Hemoglobin: 7.8 g/dL — ABNORMAL LOW (ref 12.0–15.0)
MCH: 27.8 pg (ref 26.0–34.0)
MCHC: 33.8 g/dL (ref 30.0–36.0)
MCV: 82.2 fL (ref 78.0–100.0)
Platelets: 249 10*3/uL (ref 150–400)
RBC: 2.81 MIL/uL — ABNORMAL LOW (ref 3.87–5.11)
RDW: 15.8 % — AB (ref 11.5–15.5)
WBC: 9.8 10*3/uL (ref 4.0–10.5)

## 2016-10-26 MED ORDER — SODIUM CHLORIDE 0.9 % IV SOLN: 100.0000 mL | INTRAVENOUS | Status: DC | PRN

## 2016-10-26 MED ORDER — ACETAMINOPHEN 325 MG PO TABS
ORAL_TABLET | ORAL | Status: AC
  Filled 2016-10-26: qty 2

## 2016-10-26 MED ORDER — HEPARIN SODIUM (PORCINE) 1000 UNIT/ML DIALYSIS: 40.0000 [IU]/kg | INTRAMUSCULAR | Status: DC | PRN

## 2016-10-26 MED ORDER — PENTAFLUOROPROP-TETRAFLUOROETH EX AERO: 1.0000 "application " | INHALATION_SPRAY | CUTANEOUS | Status: DC | PRN

## 2016-10-26 MED ORDER — ALTEPLASE 2 MG IJ SOLR: 2.0000 mg | Freq: Once | INTRAMUSCULAR | Status: DC | PRN

## 2016-10-26 MED ORDER — LIDOCAINE HCL (PF) 1 % IJ SOLN: 5.0000 mL | INTRAMUSCULAR | Status: DC | PRN

## 2016-10-26 MED ORDER — HEPARIN SODIUM (PORCINE) 1000 UNIT/ML DIALYSIS: 1000.0000 [IU] | INTRAMUSCULAR | Status: DC | PRN

## 2016-10-26 MED ORDER — LIDOCAINE-PRILOCAINE 2.5-2.5 % EX CREA: 1.0000 "application " | TOPICAL_CREAM | CUTANEOUS | Status: DC | PRN

## 2016-10-26 MED ORDER — TUBERCULIN PPD 5 UNIT/0.1ML ID SOLN
5.0000 [IU] | Freq: Once | INTRADERMAL | Status: DC
  Administered 2016-10-26: 5 [IU] via INTRADERMAL
  Filled 2016-10-26 (×2): qty 0.1

## 2016-10-26 NOTE — Progress Notes (Signed)
   [facility] Progress Note   Assessment/ Plan:   1. Failure to thrive in patient with progressive chronic kidney disease stage V--> ESRD: After extensive discussion with the patient, she is willing to begin chronic/maintenance hemodialysis understanding that this will be a lifelong therapy. She informs me that she will need additional help with regards to transportation to dialysis that can be arranged after she begins this. - CLIP process  - PPD ordered 11/19 - Hep B s Ab negative Ag negative, needs series - Hep C pending  2. Chronic kidney disease stage V with subtle uremic symptoms: Tolerated HD today.  No acute electrolyte abnormalities or lower uremic symptoms or signs are recognized at this point. Ideally, this could have been done as an outpatient however given her limited social support-concerns were raised about her ability to follow instructions pertaining to initiation of hemodialysis.  3. Anemia of chronic kidney disease: Check iron studies and begin ESA--> aranesp Q weekly, 11/17 start.  Doesn't need iron  4. Hypertension: Resume antihypertensive therapy and monitor with ultrafiltration and hemodialysis.- only on metop 25 BID  5. Secondary hyperparathyroidism: PTH 453 11/16 Phos 7.1, Ca acetate TID AC  Subjective:    HD #3 today.  No complaints   Objective:   BP (!) 96/57   Pulse 88   Temp 98.1 F (36.7 C) (Oral)   Resp 16   Ht 5\' 1"  (1.549 m)   Wt 58.5 kg (128 lb 15.5 oz)   SpO2 99%   BMI 24.37 kg/m   Physical Exam: ZOX:WRUEAVWGen:elderly, lying in bed CVS:RRR Resp:clear Abd: soft Ext: no LE edema ACCESS: LUE AVF with good bruit and thrill  Labs: BMET  Recent Labs Lab 10/23/16 1104 10/23/16 1650 10/23/16 1919 10/24/16 0447 10/25/16 0615 10/26/16 0851  NA 137  --   --  134* 134* 137  K 3.6  --   --  3.0* 2.5* 3.2*  CL 112*  --   --  109 101 99*  CO2 13*  --   --  10* 21* 26  GLUCOSE 102*  --   --  89 94 108*  BUN 89*  --   --  89* 53* 18   CREATININE 10.61*  --   --  10.65* 7.45* 4.25*  CALCIUM 8.7*  --   --  8.0* 7.9* 8.4*  PHOS  --  6.9* 6.4* 7.1*  --   --    CBC  Recent Labs Lab 10/23/16 1104 10/24/16 0447 10/26/16 1246  WBC 6.3 6.2 9.8  HGB 8.9* 7.2* 7.8*  HCT 26.8* 21.5* 23.1*  MCV 82.7 82.1 82.2  PLT 337 304 249    @IMGRELPRIORS @ Medications:    . ALPRAZolam  0.5 mg Oral BID  . calcium acetate  667 mg Oral TID WC  . Chlorhexidine Gluconate Cloth  6 each Topical Q0600  . darbepoetin (ARANESP) injection - DIALYSIS  60 mcg Intravenous Q Fri-HD  . donepezil  10 mg Oral QHS  . feeding supplement (NEPRO CARB STEADY)  237 mL Oral BID BM  . heparin  5,000 Units Subcutaneous Q8H  . metoprolol tartrate  25 mg Oral BID  . mupirocin ointment  1 application Nasal BID  . tetrahydrozoline  2 drop Both Eyes QID     Bufford ButtnerElizabeth Master Touchet MD [facility] Cell [phone] pgr [phone] 10/26/2016, 3:05 PM

## 2016-10-26 NOTE — Progress Notes (Signed)
Attempted to administer Tuberculin shot to patient but patient stated that she is allergic to medication and it causes her to have an reaction of swelling to her arm. She states that her MD told her not to take it anymore. She didn't say which MD it was. She says that she haven't had the medication since.

## 2016-10-26 NOTE — Progress Notes (Signed)
PROGRESS NOTE  Monica [name]  ZOX:[id number] DOB: 1932-09-19 DOA: 10/23/2016 PCP: Billee CashingMCKENZIE, WAYLAND, MD Outpatient Specialists:  Subjective: Denies any complaints this morning, await placement in a nursing home likely in a.m. CSW consulted.  Brief Narrative:  Monica [name] is a 82 y.o. female with medical history significant for advanced chronic kidney disease currently at stage V, progressive anemia secondary to renal disease, prior GI bleed, hypertension, paroxysmal atrial fibrillation and dementia. Patient had routine labs completed that revealed worsening of her renal function with creatinine up to 10.61 and BUN greater than 80. She was sent to the ER for further evaluation and treatment. She reports excessive urinary output and she attributes this to her daily Lasix dosing. She has not had any respiratory symptoms. She has not had any blood in urine or stool. She does report unintentional weight loss over unknown period time from 180 pounds to 123 pounds despite her reports eating well.  Assessment & Plan:   Principal Problem:   Uremia Active Problems:   HTN (hypertension)   PAF (paroxysmal atrial fibrillation) (HCC)   CKD (chronic kidney disease) stage 5, GFR less than 15 ml/min (HCC)   Dementia   Anemia, chronic renal failure, stage 5 (HCC)   Unintentional weight loss   Protein-calorie malnutrition, severe     Uremia/ CKD (chronic kidney disease) stage 5, GFR less than 15 ml/min  -Patient presents to ER with abnormal labs with progressive uremia and chronic kidney disease with likely initiation of dialysis during this admission -Patient reports excessive urinary output -No evidence of pulmonary edema on admission. -Left upper extremity AV fistula previously placed in April 2016. -Had her first dialysis yesterday 11/18, she can likely be discharged on Monday after dialysis.    Unintentional weight loss -Likely from combination of reported dementia and chronic kidney disease-  the patient also reporting that she eats well -In review of weight trends in the EMR her weight was around 170 pounds in November 2016 and currently is 123 pounds -Failure to thrive in adult, her. Prealbumin is 32, she was awfully weak when she came in, improving.    HTN (hypertension) -Blood pressure controlled -Continue his Lopressor and discontinued preadmission Norvasc    Anemia, chronic renal failure, stage 5  -Suspect primary cause of anemia is related to chronic kidney disease -With unexplained weight loss check stools for blood to rule out possible GI malignancy-patient had colonoscopy in 2010 at Dr. Kenna GilbertMann's office; in 2015 she had EGD that revealed a prepyloric ulcer that was H. pylori negative with repeat EGD 2 months later showed 90% healing -Check TSH -Anemia panel -Follow hemoglobin trends   Reported history of PAF (paroxysmal atrial fibrillation)  -Currently maintaining sinus rhythm -Continue Lopressor -Not on anticoagulation secondary to history of GI bleed and remote alcoholism in the 1980s   ? Dementia -Listed in medical problems and patient on Aricept prior to admission -Does have remote history of alcoholism -Currently patient is alert and oriented 4 -PT recommended SNF, CSW to help with placement.   Hypokalemia -Potassium 2.5, replete orally, nephrology for 4K bath.  DVT prophylaxis: SQ Heparin Code Status: Full Code Family Communication:  Disposition Plan:  Diet: Diet renal with fluid restriction Fluid restriction: 1200 mL Fluid; Room service appropriate? Yes; Fluid consistency: Thin  Consultants:   Nephrology  Procedures:   None  Antimicrobials:   None  Objective: Vitals:   10/26/16 0315 10/26/16 0345 10/26/16 0558 10/26/16 0952  BP: 110/60 112/80 (!) 100/52 (!) 99/48  Pulse:  74 70 75 81  Resp: 18 18 16 17   Temp:  98 F (36.7 C) 98.9 F (37.2 C) 99.1 F (37.3 C)  TempSrc:  Oral Oral Oral  SpO2:  98% 100% 100%  Weight:      Height:         Intake/Output Summary (Last 24 hours) at 10/26/16 1124 Last data filed at 10/26/16 1014  Gross per 24 hour  Intake             1140 ml  Output             1000 ml  Net              140 ml   Filed Weights   10/24/16 2041 10/25/16 2110 10/26/16 0030  Weight: 57.6 kg (126 lb 15.8 oz) 56 kg (123 lb 7.3 oz) 57.7 kg (127 lb 3.3 oz)    Examination: General exam: Appears calm and comfortable  Respiratory system: Clear to auscultation. Respiratory effort normal. Cardiovascular system: S1 & S2 heard, RRR. No JVD, murmurs, rubs, gallops or clicks. No pedal edema. Gastrointestinal system: Abdomen is nondistended, soft and nontender. No organomegaly or masses felt. Normal bowel sounds heard. Central nervous system: Alert and oriented. No focal neurological deficits. Extremities: Symmetric 5 x 5 power. Skin: No rashes, lesions or ulcers Psychiatry: Judgement and insight appear normal. Mood & affect appropriate.   Data Reviewed: I have personally reviewed following labs and imaging studies  CBC:  Recent Labs Lab 10/23/16 1104 10/24/16 0447  WBC 6.3 6.2  HGB 8.9* 7.2*  HCT 26.8* 21.5*  MCV 82.7 82.1  PLT 337 304   Basic Metabolic Panel:  Recent Labs Lab 10/23/16 1104 10/23/16 1650 10/23/16 1919 10/24/16 0447 10/25/16 0615 10/26/16 0851  NA 137  --   --  134* 134* 137  K 3.6  --   --  3.0* 2.5* 3.2*  CL 112*  --   --  109 101 99*  CO2 13*  --   --  10* 21* 26  GLUCOSE 102*  --   --  89 94 108*  BUN 89*  --   --  89* 53* 18  CREATININE 10.61*  --   --  10.65* 7.45* 4.25*  CALCIUM 8.7*  --   --  8.0* 7.9* 8.4*  MG  --  2.0  --   --   --   --   PHOS  --  6.9* 6.4* 7.1*  --   --    GFR: Estimated Creatinine Clearance: 8.3 mL/min (by C-G formula based on SCr of 4.25 mg/dL (H)). Liver Function Tests:  Recent Labs Lab 10/24/16 0447  ALBUMIN 3.0*   No results for input(s): LIPASE, AMYLASE in the last 168 hours. No results for input(s): AMMONIA in the last 168  hours. Coagulation Profile: No results for input(s): INR, PROTIME in the last 168 hours. Cardiac Enzymes: No results for input(s): CKTOTAL, CKMB, CKMBINDEX, TROPONINI in the last 168 hours. BNP (last 3 results) No results for input(s): PROBNP in the last 8760 hours. HbA1C: No results for input(s): HGBA1C in the last 72 hours. CBG: No results for input(s): GLUCAP in the last 168 hours. Lipid Profile: No results for input(s): CHOL, HDL, LDLCALC, TRIG, CHOLHDL, LDLDIRECT in the last 72 hours. Thyroid Function Tests:  Recent Labs  10/23/16 1405  TSH 1.356   Anemia Panel:  Recent Labs  10/23/16 1405  VITAMINB12 966*  FOLATE 9.9  FERRITIN 159  TIBC 231*  IRON 74  RETICCTPCT 5.0*   Urine analysis:    Component Value Date/Time   COLORURINE YELLOW 10/23/2016 1619   APPEARANCEUR CLOUDY (A) 10/23/2016 1619   LABSPEC 1.012 10/23/2016 1619   PHURINE 5.0 10/23/2016 1619   GLUCOSEU NEGATIVE 10/23/2016 1619   HGBUR SMALL (A) 10/23/2016 1619   BILIRUBINUR NEGATIVE 10/23/2016 1619   KETONESUR NEGATIVE 10/23/2016 1619   PROTEINUR 100 (A) 10/23/2016 1619   UROBILINOGEN 0.2 03/15/2015 1510   NITRITE NEGATIVE 10/23/2016 1619   LEUKOCYTESUR SMALL (A) 10/23/2016 1619   Sepsis Labs: @LABRCNTIP (procalcitonin:4,lacticidven:4)  ) Recent Results (from the past 240 hour(s))  MRSA PCR Screening     Status: Abnormal   Collection Time: 10/24/16  4:24 AM  Result Value Ref Range Status   MRSA by PCR POSITIVE (A) NEGATIVE Final    Comment:        The GeneXpert MRSA Assay (FDA approved for NASAL specimens only), is one component of a comprehensive MRSA colonization surveillance program. It is not intended to diagnose MRSA infection nor to guide or monitor treatment for MRSA infections. RESULT CALLED TO, READ BACK BY AND VERIFIED WITH: J NARAMDAS,RN AT [id number] 10/24/16 BY L BENFIELD      Invalid input(s): PROCALCITONIN, LACTICACIDVEN   Radiology Studies: No results  found.      Scheduled Meds: . ALPRAZolam  0.5 mg Oral BID  . calcium acetate  667 mg Oral TID WC  . Chlorhexidine Gluconate Cloth  6 each Topical Q0600  . darbepoetin (ARANESP) injection - DIALYSIS  60 mcg Intravenous Q Fri-HD  . donepezil  10 mg Oral QHS  . feeding supplement (NEPRO CARB STEADY)  237 mL Oral BID BM  . heparin  5,000 Units Subcutaneous Q8H  . metoprolol tartrate  25 mg Oral BID  . mupirocin ointment  1 application Nasal BID  . tetrahydrozoline  2 drop Both Eyes QID   Continuous Infusions:   LOS: 3 days    Time spent: 35 minutes    Addilynne Olheiser A, MD [facility] Pager [phone]  If 7PM-7AM, please contact night-coverage [email or web address] Password [email or web address] 10/26/2016, 11:24 AM

## 2016-10-26 NOTE — Progress Notes (Signed)
Followed up with patient in regard to TB test and patient states that she was exposed to TB before in the past. It seems as if patient is not allergic to medication but tested positive before. Will continue to administer medication to patient. Report will be passed along to nurse coming on at 11pm.

## 2016-10-26 NOTE — Procedures (Signed)
Patient seen and examined on Hemodialysis. QB 300 UF goal 500 mL. She is tolerating her treatment well. Treatment adjusted as needed.  Bufford ButtnerElizabeth Yuriel Lopezmartinez MD [facility] Cell [phone] pgr [phone] 3:12 PM

## 2016-10-27 LAB — BASIC METABOLIC PANEL
Anion gap: 12 (ref 5–15)
BUN: 18 mg/dL (ref 6–20)
CHLORIDE: 99 mmol/L — AB (ref 101–111)
CO2: 26 mmol/L (ref 22–32)
Calcium: 8.4 mg/dL — ABNORMAL LOW (ref 8.9–10.3)
Creatinine, Ser: 4.25 mg/dL — ABNORMAL HIGH (ref 0.44–1.00)
GFR calc Af Amer: 10 mL/min — ABNORMAL LOW (ref >60)
GFR calc non Af Amer: 9 mL/min — ABNORMAL LOW (ref >60)
GLUCOSE: 108 mg/dL — AB (ref 65–99)
POTASSIUM: 3.2 mmol/L — AB (ref 3.5–5.1)
Sodium: 137 mmol/L (ref 135–145)

## 2016-10-27 MED ORDER — ALPRAZOLAM 0.5 MG PO TABS: 0.5000 mg | ORAL_TABLET | Freq: Two times a day (BID) | ORAL | 0 refills | Status: DC

## 2016-10-27 MED ORDER — HYDROCODONE-ACETAMINOPHEN 5-325 MG PO TABS: 1.0000 | ORAL_TABLET | ORAL | 0 refills | Status: DC | PRN

## 2016-10-27 MED ORDER — CALCITRIOL 0.25 MCG PO CAPS
0.2500 ug | ORAL_CAPSULE | Freq: Every day | ORAL | Status: DC
  Administered 2016-10-27 – 2016-10-28 (×2): 0.25 ug via ORAL
  Filled 2016-10-27 (×2): qty 1

## 2016-10-27 MED ORDER — POLYETHYLENE GLYCOL 3350 17 G PO PACK
17.0000 g | PACK | Freq: Every day | ORAL | Status: DC
  Administered 2016-10-27 – 2016-10-28 (×2): 17 g via ORAL
  Filled 2016-10-27 (×2): qty 1

## 2016-10-27 NOTE — [place] FL2 (Signed)
[place] MEDICAID FL2 LEVEL OF CARE SCREENING TOOL     IDENTIFICATION  Patient Name: Monica [name] Birthdate: 1931-12-31 Sex: female Admission Date (Current Location): 10/23/2016  [facility] [place]   Facility and Address:  [facility] [place], [address], [place], [place] [address]      Provider Number: [id number]  Attending Physician Name and Address:  Clydia LlanoMutaz Elmahi, MD  Relative Name and Phone Number:       Current Level of Care: Hospital Recommended Level of Care: Skilled Nursing Facility Prior Approval Number:    Date Approved/Denied: 04/02/11 PASRR Number:  [phone]   Discharge Plan: SNF    Current Diagnoses: Patient Active Problem List   Diagnosis Date Noted  . Protein-calorie malnutrition, severe 10/25/2016  . Uremia 10/23/2016  . Dementia 10/23/2016  . Anemia, chronic renal failure, stage 5 (HCC) 10/23/2016  . Unintentional weight loss 10/23/2016  . Pancreatic cyst   . Hematuria   . Encounter for nasogastric (NG) tube placement   . SBO (small bowel obstruction)   . Small bowel obstruction 03/15/2015  . Abscess of buttock 12/29/2013  . HTN (hypertension) 12/29/2013  . PAF (paroxysmal atrial fibrillation) (HCC) 12/29/2013  . Melena 12/29/2013  . CKD (chronic kidney disease) stage 5, GFR less than 15 ml/min (HCC) 12/29/2013  . Escherichia coli urinary tract infection 12/29/2013  . Gastric ulcer with hemorrhage 12/29/2013  . Upper GI bleed 12/28/2013    Orientation RESPIRATION BLADDER Height & Weight     Self, Time, Situation, Place  Normal Continent Weight: 127 lb 3.3 oz (57.7 kg) Height:  5\' 1"  (154.9 cm)  BEHAVIORAL SYMPTOMS/MOOD NEUROLOGICAL BOWEL NUTRITION STATUS      Continent Diet (Diet renal with fluid restriction Fluid restriction: 1200 mL Fluid; Fluid consistency: Thin  )  AMBULATORY STATUS COMMUNICATION OF NEEDS Skin   Limited Assist Verbally                         Personal Care  Assistance Level of Assistance  Bathing, Feeding, Dressing Bathing Assistance: Limited assistance Feeding assistance: Independent Dressing Assistance: Limited assistance     Functional Limitations Info  Sight, Hearing, Speech Sight Info: Adequate Hearing Info: Adequate Speech Info: Adequate    SPECIAL CARE FACTORS FREQUENCY  PT (By licensed PT), OT (By licensed OT)     PT Frequency: 3x week OT Frequency: 3x week            Contractures Contractures Info: Not present    Additional Factors Info  Code Status, Allergies, Isolation Precautions Code Status Info: Full Allergies Info: Aspirin, Codeine     Isolation Precautions Info: Contact precaution; MRSA     Current Medications (10/27/2016):  This is the current hospital active medication list Current Facility-Administered Medications  Medication Dose Route Frequency Provider Last Rate Last Dose  . acetaminophen (TYLENOL) tablet 650 mg  650 mg Oral Q6H PRN Russella DarAllison L Ellis, NP   650 mg at 10/27/16 1109   Or  . acetaminophen (TYLENOL) suppository 650 mg  650 mg Rectal Q6H PRN Russella DarAllison L Ellis, NP      . ALPRAZolam Prudy Feeler(XANAX) tablet 0.5 mg  0.5 mg Oral BID Russella DarAllison L Ellis, NP   0.5 mg at 10/27/16 0907  . calcitRIOL (ROCALTROL) capsule 0.25 mcg  0.25 mcg Oral Daily Lauris PoagAlvin C Powell, MD   0.25 mcg at 10/27/16 1108  . calcium acetate (PHOSLO) capsule 667 mg  667 mg Oral TID WC  Bufford ButtnerElizabeth Upton, MD   667 mg at 10/27/16 0900  . Chlorhexidine Gluconate Cloth 2 % PADS 6 each  6 each Topical Q0600 Clydia LlanoMutaz Elmahi, MD   6 each at 10/27/16 0534  . Darbepoetin Alfa (ARANESP) injection 60 mcg  60 mcg Intravenous Q Fri-HD Zetta BillsJay Patel, MD   60 mcg at 10/24/16 0817  . donepezil (ARICEPT) tablet 10 mg  10 mg Oral QHS Russella DarAllison L Ellis, NP   10 mg at 10/26/16 2213  . feeding supplement (NEPRO CARB STEADY) liquid 237 mL  237 mL Oral BID BM Clydia LlanoMutaz Elmahi, MD   237 mL at 10/27/16 1000  . heparin injection 5,000 Units  5,000 Units Subcutaneous Q8H Russella DarAllison L  Ellis, NP   5,000 Units at 10/27/16 0532  . hydrALAZINE (APRESOLINE) injection 5 mg  5 mg Intravenous Q6H PRN Clydia LlanoMutaz Elmahi, MD      . methocarbamol (ROBAXIN) tablet 500 mg  500 mg Oral Q8H PRN Clydia LlanoMutaz Elmahi, MD   500 mg at 10/26/16 2213  . metoprolol tartrate (LOPRESSOR) tablet 25 mg  25 mg Oral BID Clydia LlanoMutaz Elmahi, MD   25 mg at 10/27/16 0907  . mupirocin ointment (BACTROBAN) 2 % 1 application  1 application Nasal BID Clydia LlanoMutaz Elmahi, MD   1 application at 10/27/16 0901  . tetrahydrozoline 0.05 % ophthalmic solution 2 drop  2 drop Both Eyes QID Russella DarAllison L Ellis, NP   2 drop at 10/27/16 0901  . tuberculin injection 5 Units  5 Units Intradermal Once Bufford ButtnerElizabeth Upton, MD   5 Units at 10/26/16 2257     Discharge Medications: Please see discharge summary for a list of discharge medications.  Relevant Imaging Results:  Relevant Lab Results:   Additional Information SSN: [id number]  Volney AmericanBridget A Mayton, LCSW

## 2016-10-27 NOTE — Progress Notes (Signed)
  1.Failure to thrive in patient with progressive chronic kidney disease stage V--> ESRD:Await Clip, HD in AM 2. Anemia of chronic kidney disease:  begin ESA--> aranesp Q weekly 4. Hypertension:Resume antihypertensive therapy and monitor with ultrafiltration and hemodialysis. 5. Secondary hyperparathyroidism:Begin binder and calcitirol  Subjective: Interval History: Better  Objective: Vital signs in last 24 hours: Temp:  [98.1 F (36.7 C)-99.1 F (37.3 C)] 98.6 F (37 C) (11/20 0615) Pulse Rate:  [71-107] 71 (11/20 0615) Resp:  [14-17] 16 (11/20 0615) BP: (91-120)/(48-79) 118/52 (11/20 0615) SpO2:  [99 %-100 %] 100 % (11/20 0615) Weight:  [57.7 kg (127 lb 3.3 oz)-58.5 kg (128 lb 15.5 oz)] 57.7 kg (127 lb 3.3 oz) (11/19 2154) Weight change: 2.5 kg (5 lb 8.2 oz)  Intake/Output from previous day: 11/19 0701 - 11/20 0700 In: 480 [P.O.:480] Out: 1051 [Urine:550; Stool:1] Intake/Output this shift: No intake/output data recorded.  Extremities: LUE AV thrill  Abd soft No asterixis  Lab Results:  Recent Labs  10/26/16 1246  WBC 9.8  HGB 7.8*  HCT 23.1*  PLT 249   BMET:  Recent Labs  10/25/16 0615 10/26/16 0851  NA 134* 137  K 2.5* 3.2*  CL 101 99*  CO2 21* 26  GLUCOSE 94 108*  BUN 53* 18  CREATININE 7.45* 4.25*  CALCIUM 7.9* 8.4*   No results for input(s): PTH in the last 72 hours. Iron Studies: No results for input(s): IRON, TIBC, TRANSFERRIN, FERRITIN in the last 72 hours. Studies/Results: No results found.  Scheduled: . ALPRAZolam  0.5 mg Oral BID  . calcium acetate  667 mg Oral TID WC  . Chlorhexidine Gluconate Cloth  6 each Topical Q0600  . darbepoetin (ARANESP) injection - DIALYSIS  60 mcg Intravenous Q Fri-HD  . donepezil  10 mg Oral QHS  . feeding supplement (NEPRO CARB STEADY)  237 mL Oral BID BM  . heparin  5,000 Units Subcutaneous Q8H  . metoprolol tartrate  25 mg Oral BID  . mupirocin ointment  1 application Nasal BID  . tetrahydrozoline   2 drop Both Eyes QID  . tuberculin  5 Units Intradermal Once       LOS: 4 days   Lazarus Sudbury C 10/27/2016,8:52 AM

## 2016-10-27 NOTE — Progress Notes (Signed)
PT Cancellation Note  Patient Details Name: Monica LimesMary S Gilmartin MRN: [id number] DOB: [birth year]   Cancelled Treatment:    Reason Eval/Treat Not Completed: Fatigue/lethargy limiting ability to participate;Other (comment) (just returned from HD).  Try again tomorrow.   Ivar DrapeStout, Marshawn Ninneman E 10/27/2016, 2:13 PM    Samul Dadauth Viliami Bracco, PT MS Acute Rehab Dept. Number: [facility] [phone] and [facility] [phone]

## 2016-10-27 NOTE — Care Management Important Message (Signed)
Important Message  Patient Details  Name: Malva LimesMary S Hutchins MRN: [id number] Date of Birth: 1932-05-07   Medicare Important Message Given:  Yes    Aleanna Menge, Annamarie Majorheryl U, RN 10/27/2016, 10:36 AM

## 2016-10-27 NOTE — Progress Notes (Signed)
10/27/2016 11:12 AM Hemodialysis Outpatient Note; this patient has been accepted at the [facility] on a Monday,Wednesday and Friday 2nd shift schedule. The chair time will be 12:20PM and the patient will need to be a the center for 11:45AM her first day. Thank you. Cleotis NipperJudith Bret Stamour.

## 2016-10-27 NOTE — Progress Notes (Signed)
PROGRESS NOTE  Monica LimesMary S [name]  WUJ:[id number] DOB: [birth year] DOA: 10/23/2016 PCP: Billee CashingMCKENZIE, WAYLAND, MD Outpatient Specialists:  Subjective: No changes clinically since yesterday, awaiting clip process. To skilled nursing facility when she has a bed for dialysis as outpatient.  Brief Narrative:  Monica [name] is a 82 y.o. female with medical history significant for advanced chronic kidney disease currently at stage V, progressive anemia secondary to renal disease, prior GI bleed, hypertension, paroxysmal atrial fibrillation and dementia. Patient had routine labs completed that revealed worsening of her renal function with creatinine up to 10.61 and BUN greater than 80. She was sent to the ER for further evaluation and treatment. She reports excessive urinary output and she attributes this to her daily Lasix dosing. She has not had any respiratory symptoms. She has not had any blood in urine or stool. She does report unintentional weight loss over unknown period time from 180 pounds to 123 pounds despite her reports eating well.  Assessment & Plan:   Principal Problem:   Uremia Active Problems:   HTN (hypertension)   PAF (paroxysmal atrial fibrillation) (HCC)   CKD (chronic kidney disease) stage 5, GFR less than 15 ml/min (HCC)   Dementia   Anemia, chronic renal failure, stage 5 (HCC)   Unintentional weight loss   Protein-calorie malnutrition, severe     Uremia/ CKD (chronic kidney disease) stage 5, GFR less than 15 ml/min  -Patient presents to ER with abnormal labs with progressive uremia and chronic kidney disease with likely initiation of dialysis during this admission -Patient reports excessive urinary output -No evidence of pulmonary edema on admission. -Left upper extremity AV fistula previously placed in April 2016. -Had her first dialysis 11/18, awaiting outpatient spot for analysis.    Unintentional weight loss/failure to thrive -Likely from combination of reported dementia  and CKD, the patient also reporting that she eats well -In review of weight trends in the EMR her weight was around 170 lbs in November 2016 and currently is 123 lbs -Failure to thrive in adult, I'm not sure her weight of 170 was representing fluid overload as well.    HTN (hypertension) -Blood pressure controlled -Continue his Lopressor and discontinued preadmission Norvasc    Anemia, chronic renal failure, stage 5  -Suspect primary cause of anemia is related to chronic kidney disease -With unexplained weight loss check stools for blood to rule out possible GI malignancy-patient had colonoscopy in 2010 at Dr. Kenna GilbertMann's office; in 2015 she had EGD that revealed a prepyloric ulcer that was H. pylori negative with repeat EGD 2 months later showed 90% healing -Anemia panel showed adequate iron, folate, B12 and ferritin. TSH is normal. -Follow hemoglobin trends   Reported history of PAF (paroxysmal atrial fibrillation)  -Currently maintaining sinus rhythm -Continue Lopressor -Not on anticoagulation secondary to history of GI bleed and remote alcoholism in the 1980s   ? Dementia -Listed in medical problems and patient on Aricept prior to admission -Does have remote history of alcoholism -Currently patient is alert and oriented 4 -PT recommended SNF, CSW to help with placement.   Hypokalemia -Potassium 2.5, replete orally, nephrology for 4K bath.  DVT prophylaxis: SQ Heparin Code Status: Full Code Family Communication:  Disposition Plan:  Diet: Diet renal with fluid restriction Fluid restriction: 1200 mL Fluid; Room service appropriate? Yes; Fluid consistency: Thin Diet - low sodium heart healthy  Consultants:   Nephrology  Procedures:   None  Antimicrobials:   None  Objective: Vitals:   10/26/16 2154 10/27/16 0615  10/27/16 0907 10/27/16 0933  BP: (!) 120/57 (!) 118/52 123/64   Pulse: 95 71  80  Resp: 16 16  17   Temp: 98.9 F (37.2 C) 98.6 F (37 C)  98.7 F (37.1  C)  TempSrc: Oral Oral  Oral  SpO2: 100% 100%  100%  Weight: 57.7 kg (127 lb 3.3 oz)     Height:        Intake/Output Summary (Last 24 hours) at 10/27/16 1008 Last data filed at 10/27/16 0700  Gross per 24 hour  Intake              480 ml  Output             1051 ml  Net             -571 ml   Filed Weights   10/26/16 1230 10/26/16 1641 10/26/16 2154  Weight: 58.5 kg (128 lb 15.5 oz) 58.5 kg (128 lb 15.5 oz) 57.7 kg (127 lb 3.3 oz)    Examination: General exam: Appears calm and comfortable  Respiratory system: Clear to auscultation. Respiratory effort normal. Cardiovascular system: S1 & S2 heard, RRR. No JVD, murmurs, rubs, gallops or clicks. No pedal edema. Gastrointestinal system: Abdomen is nondistended, soft and nontender. No organomegaly or masses felt. Normal bowel sounds heard. Central nervous system: Alert and oriented. No focal neurological deficits. Extremities: Symmetric 5 x 5 power. Skin: No rashes, lesions or ulcers Psychiatry: Judgement and insight appear normal. Mood & affect appropriate.   Data Reviewed: I have personally reviewed following labs and imaging studies  CBC:  Recent Labs Lab 10/23/16 1104 10/24/16 0447 10/26/16 1246  WBC 6.3 6.2 9.8  HGB 8.9* 7.2* 7.8*  HCT 26.8* 21.5* 23.1*  MCV 82.7 82.1 82.2  PLT 337 304 249   Basic Metabolic Panel:  Recent Labs Lab 10/23/16 1104 10/23/16 1650 10/23/16 1919 10/24/16 0447 10/25/16 0615 10/26/16 0851  NA 137  --   --  134* 134* 137  K 3.6  --   --  3.0* 2.5* 3.2*  CL 112*  --   --  109 101 99*  CO2 13*  --   --  10* 21* 26  GLUCOSE 102*  --   --  89 94 108*  BUN 89*  --   --  89* 53* 18  CREATININE 10.61*  --   --  10.65* 7.45* 4.25*  CALCIUM 8.7*  --   --  8.0* 7.9* 8.4*  MG  --  2.0  --   --   --   --   PHOS  --  6.9* 6.4* 7.1*  --   --    GFR: Estimated Creatinine Clearance: 8.3 mL/min (by C-G formula based on SCr of 4.25 mg/dL (H)). Liver Function Tests:  Recent Labs Lab  10/24/16 0447  ALBUMIN 3.0*   No results for input(s): LIPASE, AMYLASE in the last 168 hours. No results for input(s): AMMONIA in the last 168 hours. Coagulation Profile: No results for input(s): INR, PROTIME in the last 168 hours. Cardiac Enzymes: No results for input(s): CKTOTAL, CKMB, CKMBINDEX, TROPONINI in the last 168 hours. BNP (last 3 results) No results for input(s): PROBNP in the last 8760 hours. HbA1C: No results for input(s): HGBA1C in the last 72 hours. CBG: No results for input(s): GLUCAP in the last 168 hours. Lipid Profile: No results for input(s): CHOL, HDL, LDLCALC, TRIG, CHOLHDL, LDLDIRECT in the last 72 hours. Thyroid Function Tests: No results for input(s): TSH, T4TOTAL,  FREET4, T3FREE, THYROIDAB in the last 72 hours. Anemia Panel: No results for input(s): VITAMINB12, FOLATE, FERRITIN, TIBC, IRON, RETICCTPCT in the last 72 hours. Urine analysis:    Component Value Date/Time   COLORURINE YELLOW 10/23/2016 1619   APPEARANCEUR CLOUDY (A) 10/23/2016 1619   LABSPEC 1.012 10/23/2016 1619   PHURINE 5.0 10/23/2016 1619   GLUCOSEU NEGATIVE 10/23/2016 1619   HGBUR SMALL (A) 10/23/2016 1619   BILIRUBINUR NEGATIVE 10/23/2016 1619   KETONESUR NEGATIVE 10/23/2016 1619   PROTEINUR 100 (A) 10/23/2016 1619   UROBILINOGEN 0.2 03/15/2015 1510   NITRITE NEGATIVE 10/23/2016 1619   LEUKOCYTESUR SMALL (A) 10/23/2016 1619   Sepsis Labs: @LABRCNTIP (procalcitonin:4,lacticidven:4)  ) Recent Results (from the past 240 hour(s))  MRSA PCR Screening     Status: Abnormal   Collection Time: 10/24/16  4:24 AM  Result Value Ref Range Status   MRSA by PCR POSITIVE (A) NEGATIVE Final    Comment:        The GeneXpert MRSA Assay (FDA approved for NASAL specimens only), is one component of a comprehensive MRSA colonization surveillance program. It is not intended to diagnose MRSA infection nor to guide or monitor treatment for MRSA infections. RESULT CALLED TO, READ BACK BY AND  VERIFIED WITH: J NARAMDAS,RN AT [id number] 10/24/16 BY L BENFIELD      Invalid input(s): PROCALCITONIN, LACTICACIDVEN   Radiology Studies: No results found.      Scheduled Meds: . ALPRAZolam  0.5 mg Oral BID  . calcitRIOL  0.25 mcg Oral Daily  . calcium acetate  667 mg Oral TID WC  . Chlorhexidine Gluconate Cloth  6 each Topical Q0600  . darbepoetin (ARANESP) injection - DIALYSIS  60 mcg Intravenous Q Fri-HD  . donepezil  10 mg Oral QHS  . feeding supplement (NEPRO CARB STEADY)  237 mL Oral BID BM  . heparin  5,000 Units Subcutaneous Q8H  . metoprolol tartrate  25 mg Oral BID  . mupirocin ointment  1 application Nasal BID  . tetrahydrozoline  2 drop Both Eyes QID  . tuberculin  5 Units Intradermal Once   Continuous Infusions:   LOS: 4 days    Time spent: 35 minutes    Anelly Samarin A, MD [facility] Pager [phone]  If 7PM-7AM, please contact night-coverage [email or web address] Password [email or web address] 10/27/2016, 10:08 AM

## 2016-10-27 NOTE — Clinical Social Work Note (Signed)
Clinical Social Work Assessment  Patient Details  Name: Monica [name] MRN: [id number] Date of Birth: 1932-06-09  Date of referral:  10/27/16               Reason for consult:  Facility Placement                Permission sought to share information with:  Other (Neighbor/friend) Permission granted to share information::  Yes, Verbal Permission Granted  Name::     Rosezella FloridaCindy Barr  Agency::     Relationship::  Research scientist (life sciences)eighbor  Contact Information:  [phone]  Housing/Transportation Living arrangements for the past 2 months:  Single Family Home Source of Information:  Patient Patient Interpreter Needed:  None Criminal Activity/Legal Involvement Pertinent to Current Situation/Hospitalization:  No - Comment as needed Significant Relationships:  Neighbor Lives with:  Self Do you feel safe going back to the place where you live?  Yes Need for family participation in patient care:  Yes (Comment)  Care giving concerns:  No family or friends at bedside at this time. Patient reports she has no family in the area who are willing to help with her care. Patient reports she has a neighbor who helps her.   Social Worker assessment / plan:  CSW spoke with patient at bedside.Patient lives at home alone and has a neighbor who she can call for help. Patient reports she has family in the area however, they are not willing to assist in her care. At first patient was adimet about going home and refusing SNF. CSW explained the need for SNF placement and worked with the patient for her to understand what it would look like when she went home and had no help. Patient verbalized agreement for CSW to send out referral to facilities in [place]. CSW will continue to facilitate SNF placement and present patient with bed offers when available.   Employment status:  Retired Database administratornsurance information:  Managed Medicare PT Recommendations:  Skilled Nursing Facility Information / Referral to community resources:  Skilled Nursing  Facility  Patient/Family's Response to care:  Patient verbalized understanding of CSW role and appreciation of support. Patient apprehensive about SNF placement however agreeable at this time.  Patient/Family's Understanding of and Emotional Response to Diagnosis, Current Treatment, and Prognosis:  Patient understanding of need for SNF placement once medically stable for d/c. Patient agreeable to SNF placement at this time.   Emotional Assessment Appearance:  Appears stated age Attitude/Demeanor/Rapport:   (Patient was appropriate) Affect (typically observed):  Agitated, Appropriate, Calm Orientation:  Oriented to Self, Oriented to Place, Oriented to  Time, Oriented to Situation Alcohol / Substance use:  Not Applicable Psych involvement (Current and /or in the community):  No (Comment)  Discharge Needs  Concerns to be addressed:  Care Coordination Readmission within the last 30 days:  No Current discharge risk:  Dependent with Mobility, Lives alone Barriers to Discharge:  Continued Medical Work up   Safeway IncBridget A Mayton, LCSW 10/27/2016, 11:41 AM

## 2016-10-28 ENCOUNTER — Encounter: Payer: Self-pay | Admitting: Internal Medicine

## 2016-10-28 ENCOUNTER — Non-Acute Institutional Stay (SKILLED_NURSING_FACILITY): Payer: Medicare Other | Admitting: Internal Medicine

## 2016-10-28 DIAGNOSIS — F0280 Dementia in other diseases classified elsewhere without behavioral disturbance: Secondary | ICD-10-CM

## 2016-10-28 DIAGNOSIS — I10 Essential (primary) hypertension: Secondary | ICD-10-CM | POA: Diagnosis not present

## 2016-10-28 DIAGNOSIS — N185 Chronic kidney disease, stage 5: Secondary | ICD-10-CM | POA: Diagnosis not present

## 2016-10-28 DIAGNOSIS — R079 Chest pain, unspecified: Secondary | ICD-10-CM

## 2016-10-28 DIAGNOSIS — I480 Paroxysmal atrial fibrillation: Secondary | ICD-10-CM

## 2016-10-28 HISTORY — DX: Chest pain, unspecified: R07.9

## 2016-10-28 LAB — RENAL FUNCTION PANEL
ANION GAP: 13 (ref 5–15)
Albumin: 2.9 g/dL — ABNORMAL LOW (ref 3.5–5.0)
BUN: 27 mg/dL — ABNORMAL HIGH (ref 6–20)
CHLORIDE: 94 mmol/L — AB (ref 101–111)
CO2: 26 mmol/L (ref 22–32)
CREATININE: 5.65 mg/dL — AB (ref 0.44–1.00)
Calcium: 8.7 mg/dL — ABNORMAL LOW (ref 8.9–10.3)
GFR, EST AFRICAN AMERICAN: 7 mL/min — AB (ref >60)
GFR, EST NON AFRICAN AMERICAN: 6 mL/min — AB (ref >60)
Glucose, Bld: 92 mg/dL (ref 65–99)
POTASSIUM: 3.6 mmol/L (ref 3.5–5.1)
Phosphorus: 2.2 mg/dL — ABNORMAL LOW (ref 2.5–4.6)
Sodium: 133 mmol/L — ABNORMAL LOW (ref 135–145)

## 2016-10-28 LAB — CBC
HEMATOCRIT: 21.4 % — AB (ref 36.0–46.0)
HEMOGLOBIN: 7 g/dL — AB (ref 12.0–15.0)
MCH: 27.9 pg (ref 26.0–34.0)
MCHC: 32.7 g/dL (ref 30.0–36.0)
MCV: 85.3 fL (ref 78.0–100.0)
PLATELETS: 237 10*3/uL (ref 150–400)
RBC: 2.51 MIL/uL — AB (ref 3.87–5.11)
RDW: 15.9 % — ABNORMAL HIGH (ref 11.5–15.5)
WBC: 10.7 10*3/uL — AB (ref 4.0–10.5)

## 2016-10-28 LAB — HCV COMMENT:

## 2016-10-28 LAB — PREPARE RBC (CROSSMATCH)

## 2016-10-28 LAB — HEPATITIS C ANTIBODY (REFLEX)

## 2016-10-28 MED ORDER — LIDOCAINE-PRILOCAINE 2.5-2.5 % EX CREA: 1.0000 "application " | TOPICAL_CREAM | CUTANEOUS | Status: DC | PRN

## 2016-10-28 MED ORDER — ALTEPLASE 2 MG IJ SOLR: 2.0000 mg | Freq: Once | INTRAMUSCULAR | Status: DC | PRN

## 2016-10-28 MED ORDER — PENTAFLUOROPROP-TETRAFLUOROETH EX AERO: 1.0000 "application " | INHALATION_SPRAY | CUTANEOUS | Status: DC | PRN

## 2016-10-28 MED ORDER — SODIUM CHLORIDE 0.9 % IV SOLN: 100.0000 mL | INTRAVENOUS | Status: DC | PRN

## 2016-10-28 MED ORDER — LIDOCAINE HCL (PF) 1 % IJ SOLN: 5.0000 mL | INTRAMUSCULAR | Status: DC | PRN

## 2016-10-28 MED ORDER — HEPARIN SODIUM (PORCINE) 1000 UNIT/ML DIALYSIS: 1000.0000 [IU] | INTRAMUSCULAR | Status: DC | PRN

## 2016-10-28 MED ORDER — HEPARIN SODIUM (PORCINE) 1000 UNIT/ML DIALYSIS: 20.0000 [IU]/kg | INTRAMUSCULAR | Status: DC | PRN

## 2016-10-28 MED ORDER — SODIUM CHLORIDE 0.9 % IV SOLN
100.0000 mL | INTRAVENOUS | Status: DC | PRN
Start: 2016-10-28 — End: 2016-10-28

## 2016-10-28 NOTE — Progress Notes (Signed)
PT Cancellation Note  Patient Details Name: Malva LimesMary S Garbers MRN: [id number] DOB: 1932-04-13   Cancelled Treatment:    Reason Eval/Treat Not Completed: Patient at procedure or test/unavailable (pt at HD, will attempt later today. )   Tamala SerUhlenberg, Lael Pilch Kistler 10/28/2016, 9:04 AM

## 2016-10-28 NOTE — Progress Notes (Signed)
10/28/2016 10:09 AM Hemodialysis Outpatient Note; This patient has been accepted at the [facility] on a Monday,Wednesday and Friday 2nd shift schedule.The center can begin treatment on Friday November 24th at 11:40AM. Thank You. Cleotis NipperJudith Lyann [name].

## 2016-10-28 NOTE — Assessment & Plan Note (Signed)
Portable chest x-ray as none was performed during the hospitalization

## 2016-10-28 NOTE — Procedures (Signed)
Initiating HD, LUE AV, She reports she is not afraid anymore. Stable hemodynamics.  Monica [name]

## 2016-10-28 NOTE — Discharge Summary (Addendum)
Physician Discharge Summary  Monica [name] ZOX:[id number] DOB: [birth year] DOA: 10/23/2016  PCP: Billee CashingMCKENZIE, WAYLAND, MD  Admit date: 10/23/2016 Discharge date: 10/28/2016  Admitted From: Home Disposition: Home  Recommendations for Outpatient Follow-up:  1. Follow up with PCP in 1-2 weeks 2. Please obtain BMP/CBC in one week  Home Health: NA Equipment/Devices:NA  Discharge Condition: Stable CODE STATUS: Full Code Diet recommendation: Diet renal with fluid restriction Fluid restriction: 1200 mL Fluid; Room service appropriate? Yes; Fluid consistency: Thin Diet - low sodium heart healthy Diet - low sodium heart healthy  Brief/Interim Summary: Monica [name] a 82 y.o.femalewith medical history significant for advanced chronic kidney disease currently at stage V, progressive anemia secondary to renal disease, prior GI bleed, hypertension, paroxysmal atrial fibrillation and dementia. Patient had routine labs completed that revealed worsening of her renal function with creatinine up to 10.61 and BUN greater than 80. She was sent to the ER for further evaluation and treatment. She reports excessive urinary output and she attributes this to her daily Lasix dosing. She has not had any respiratory symptoms. She has not had any blood in urine or stool. She does report unintentional weight loss over unknown period time from 180 pounds to 123 pounds despite her reports eating well.  Discharge Diagnoses:  Principal Problem:   Uremia Active Problems:   HTN (hypertension)   PAF (paroxysmal atrial fibrillation) (HCC)   CKD (chronic kidney disease) stage 5, GFR less than 15 ml/min (HCC)   Dementia   Anemia, chronic renal failure, stage 5 (HCC)   Unintentional weight loss   Protein-calorie malnutrition, severe   Uremia/CKD (chronic kidney disease) stage 5, GFR less than 15 ml/min  -Patient presents to ER with abnormal labs with progressive uremia and chronic kidney disease with likely initiation  of dialysis during this admission -Patient reports excessive urinary output -No evidence of pulmonary edema on admission. -Left upper extremity AV fistula previously placed in April 2016. -Had her first dialysis 11/18, [facility] on Monday Wednesday and Friday. Second shift schedule. -Dialysis at the center begins on November 24 15 11:40 AM.  Unintentional weight loss/failure to thrive -Likely from combination of reported dementia and CKD, the patient also reporting that she eats well -In review of weight trends in the EMR her weight was around 170 lbs in November 2016 and currently is 123 lbs -Failure to thrive in adult,  likely part of that was fluid retention and it was lost.  HTN (hypertension) -Blood pressure controlled -Continue his Lopressor and discontinued preadmission Norvasc  Anemia, chronic renal failure, stage 5  -Suspect primary cause of anemia is related to chronic kidney disease -With unexplained weight loss check stools for blood to rule out possible GI malignancy-patient had colonoscopy in 2010 at Dr. Kenna GilbertMann's office;in 2015 she had EGD that revealed a prepyloric ulcer that was H. pylori negative with repeat EGD 2 months later showed 90% healing -Anemia panel showed adequate iron, folate, B12 and ferritin. TSH is normal. -Hemoglobin was 7.0 on the day of discharge, patient had 2 unit transfused with dialysis ( on 11/21).  Reported history ofPAF (paroxysmal atrial fibrillation)  -Currently maintaining sinus rhythm -Continue Lopressor -Not on anticoagulation secondary to history of GI bleed and remote alcoholism in the 1980s  ?Dementia -Listed in medical problems and patient on Aricept prior to admission -Does have remote history of alcoholism -Currently patient is alert and oriented 4 -PT recommended SNF, CSW to help with placement.  Hypokalemia -Potassium 2.5, replete  orally, this is resolved.  Severe  malnutrition and context of chronic illness -Dietitian consulted, patient now eating percent of her meals.  Discharge Instructions  Discharge Instructions    Diet - low sodium heart healthy    Complete by:  As directed    Diet - low sodium heart healthy    Complete by:  As directed    Increase activity slowly    Complete by:  As directed    Increase activity slowly    Complete by:  As directed        Medication List    STOP taking these medications   amLODipine 10 MG tablet Commonly known as:  NORVASC   cephALEXin 500 MG capsule Commonly known as:  KEFLEX   furosemide 80 MG tablet Commonly known as:  LASIX   methocarbamol 500 MG tablet Commonly known as:  ROBAXIN     TAKE these medications   acetaminophen 500 MG tablet Commonly known as:  TYLENOL Take 1,000 mg by mouth every 6 (six) hours as needed for mild pain.   ALLERGY RELIEF EYE DROPS OP Place 2 drops into both eyes as needed (for dry/itchy eyes).   ALPRAZolam 0.5 MG tablet Commonly known as:  XANAX Take 1 tablet (0.5 mg total) by mouth 2 (two) times daily.   donepezil 10 MG tablet Commonly known as:  ARICEPT Take 10 mg by mouth at bedtime.   HYDROcodone-acetaminophen 5-325 MG tablet Commonly known as:  NORCO/VICODIN Take 1-2 tablets by mouth every 4 (four) hours as needed for moderate pain. What changed:  how much to take  reasons to take this   metoprolol tartrate 25 MG tablet Commonly known as:  LOPRESSOR Take 1 tablet (25 mg total) by mouth 2 (two) times daily.   miconazole 2 % cream Commonly known as:  MICOTIN Apply 1 application topically 2 (two) times daily.   nitroGLYCERIN 0.4 MG SL tablet Commonly known as:  NITROSTAT Place 0.4 mg under the tongue every 5 (five) minutes as needed for chest pain.   NYAMYC powder Generic drug:  nystatin Apply 1 application topically at bedtime.   pantoprazole 40 MG tablet Commonly known as:  PROTONIX Take 1 tablet (40 mg total) by mouth daily.    VISINE OP Place 2 drops into both eyes daily as needed (dry eyes).      Contact information for after-discharge care    Destination    [facility] .   Specialty:  Skilled Nursing Facility Contact information: [address] [place] [address] [phone]             Allergies  Allergen Reactions  . Aspirin Other (See Comments)    "makes my stomach hurt"  . Codeine Other (See Comments)    "i get irritated"    Consultations:  Nephrology   Procedures (Echo, Carotid, EGD, Colonoscopy, ERCP)  -Started on dialysis, new to hemodialysis on 10/25/2016. -2 units of packed RBCs transfused on 10/28/2016  Radiological studies:  No results found.  Subjective:  Discharge Exam: Vitals:   10/28/16 0930 10/28/16 0945 10/28/16 1000 10/28/16 1013  BP: (!) 100/57 (!) 104/58 102/63   Pulse: 85 82 84 89  Resp:  (!) 21 17 19   Temp:  98.4 F (36.9 C)  98.4 F (36.9 C)  TempSrc:  Oral  Oral  SpO2:      Weight:      Height:       General: Pt is alert, awake, not in  acute distress Cardiovascular: RRR, S1/S2 +, no rubs, no gallops Respiratory: CTA bilaterally, no wheezing, no rhonchi Abdominal: Soft, NT, ND, bowel sounds + Extremities: no edema, no cyanosis   The results of significant diagnostics from this hospitalization (including imaging, microbiology, ancillary and laboratory) are listed below for reference.    Microbiology: Recent Results (from the past 240 hour(s))  MRSA PCR Screening     Status: Abnormal   Collection Time: 10/24/16  4:24 AM  Result Value Ref Range Status   MRSA by PCR POSITIVE (A) NEGATIVE Final    Comment:        The GeneXpert MRSA Assay (FDA approved for NASAL specimens only), is one component of a comprehensive MRSA colonization surveillance program. It is not intended to diagnose MRSA infection nor to guide or monitor treatment for MRSA infections. RESULT CALLED TO, READ BACK BY AND  VERIFIED WITH: J NARAMDAS,RN AT 1610 10/24/16 BY L BENFIELD      Labs: BNP (last 3 results) No results for input(s): BNP in the last 8760 hours. Basic Metabolic Panel:  Recent Labs Lab 10/23/16 1104 10/23/16 1650 10/23/16 1919 10/24/16 0447 10/25/16 0615 10/26/16 0851 10/28/16 0822  NA 137  --   --  134* 134* 137 133*  K 3.6  --   --  3.0* 2.5* 3.2* 3.6  CL 112*  --   --  109 101 99* 94*  CO2 13*  --   --  10* 21* 26 26  GLUCOSE 102*  --   --  89 94 108* 92  BUN 89*  --   --  89* 53* 18 27*  CREATININE 10.61*  --   --  10.65* 7.45* 4.25* 5.65*  CALCIUM 8.7*  --   --  8.0* 7.9* 8.4* 8.7*  MG  --  2.0  --   --   --   --   --   PHOS  --  6.9* 6.4* 7.1*  --   --  2.2*   Liver Function Tests:  Recent Labs Lab 10/24/16 0447 10/28/16 0822  ALBUMIN 3.0* 2.9*   No results for input(s): LIPASE, AMYLASE in the last 168 hours. No results for input(s): AMMONIA in the last 168 hours. CBC:  Recent Labs Lab 10/23/16 1104 10/24/16 0447 10/26/16 1246 10/28/16 0822  WBC 6.3 6.2 9.8 10.7*  HGB 8.9* 7.2* 7.8* 7.0*  HCT 26.8* 21.5* 23.1* 21.4*  MCV 82.7 82.1 82.2 85.3  PLT 337 304 249 237   Cardiac Enzymes: No results for input(s): CKTOTAL, CKMB, CKMBINDEX, TROPONINI in the last 168 hours. BNP: Invalid input(s): POCBNP CBG: No results for input(s): GLUCAP in the last 168 hours. D-Dimer No results for input(s): DDIMER in the last 72 hours. Hgb A1c No results for input(s): HGBA1C in the last 72 hours. Lipid Profile No results for input(s): CHOL, HDL, LDLCALC, TRIG, CHOLHDL, LDLDIRECT in the last 72 hours. Thyroid function studies No results for input(s): TSH, T4TOTAL, T3FREE, THYROIDAB in the last 72 hours.  Invalid input(s): FREET3 Anemia work up No results for input(s): VITAMINB12, FOLATE, FERRITIN, TIBC, IRON, RETICCTPCT in the last 72 hours. Urinalysis    Component Value Date/Time   COLORURINE YELLOW 10/23/2016 1619   APPEARANCEUR CLOUDY (A) 10/23/2016 1619    LABSPEC 1.012 10/23/2016 1619   PHURINE 5.0 10/23/2016 1619   GLUCOSEU NEGATIVE 10/23/2016 1619   HGBUR SMALL (A) 10/23/2016 1619   BILIRUBINUR NEGATIVE 10/23/2016 1619   KETONESUR NEGATIVE 10/23/2016 1619   PROTEINUR 100 (A) 10/23/2016 1619  UROBILINOGEN 0.2 03/15/2015 1510   NITRITE NEGATIVE 10/23/2016 1619   LEUKOCYTESUR SMALL (A) 10/23/2016 1619   Sepsis Labs Invalid input(s): PROCALCITONIN,  WBC,  LACTICIDVEN Microbiology Recent Results (from the past 240 hour(s))  MRSA PCR Screening     Status: Abnormal   Collection Time: 10/24/16  4:24 AM  Result Value Ref Range Status   MRSA by PCR POSITIVE (A) NEGATIVE Final    Comment:        The GeneXpert MRSA Assay (FDA approved for NASAL specimens only), is one component of a comprehensive MRSA colonization surveillance program. It is not intended to diagnose MRSA infection nor to guide or monitor treatment for MRSA infections. RESULT CALLED TO, READ BACK BY AND VERIFIED WITH: J NARAMDAS,RN AT [phone] 10/24/16 BY L BENFIELD      Time coordinating discharge: Over 30 minutes  SIGNED:   Clint LippsELMAHI,Calyse Murcia A, MD  [facility] 10/28/2016, 10:21 AM Pager   If 7PM-7AM, please contact night-coverage [email or web address] Password [email or web address]

## 2016-10-28 NOTE — Clinical Social Work Placement (Signed)
   CLINICAL SOCIAL WORK PLACEMENT  NOTE  Date:  10/28/2016  Patient Details  Name: Monica [name] MRN: [id number] Date of Birth: 1931/12/17  Clinical Social Work is seeking post-discharge placement for this patient at the Skilled  Nursing Facility level of care (*CSW will initial, date and re-position this form in  chart as items are completed):  Yes   Patient/family provided with [facility] Social Work Department's list of facilities offering this level of care within the geographic area requested by the patient (or if unable, by the patient's family).  Yes   Patient/family informed of their freedom to choose among providers that offer the needed level of care, that participate in Medicare, Medicaid or managed care program needed by the patient, have an available bed and are willing to accept the patient.      Patient/family informed of [facility] ownership interest in [facility] and [facility], as well as of the fact that they are under no obligation to receive care at these facilities.  PASRR submitted to EDS on       PASRR number received on       Existing PASRR number confirmed on 10/27/16     FL2 transmitted to all facilities in geographic area requested by pt/family on 10/27/16     FL2 transmitted to all facilities within larger geographic area on 10/27/16     Patient informed that his/her managed care company has contracts with or will negotiate with certain facilities, including the following:        Yes   Patient/family informed of bed offers received.  [facility]     Physician recommends and patient chooses bed at      [facility] on 10/28/16.  Patient to be transferred to facility by [facility]     Patient family notified on 10/28/16 of transfer.  Name of family member notified:        PHYSICIAN Please prepare priority discharge summary, including medications      Additional Comment:    _______________________________________________ Volney AmericanBridget A Mayton, LCSW 10/28/2016, 10:18 AM

## 2016-10-28 NOTE — Assessment & Plan Note (Signed)
Focus is rate control 

## 2016-10-28 NOTE — Patient Instructions (Signed)
See Current Assessment & Plan in Problem List under specific Diagnosis 

## 2016-10-28 NOTE — Clinical Social Work Note (Signed)
Clinical Social Worker facilitated patient discharge including contacting patient family and facility to confirm patient discharge plans.  Clinical information faxed to facility and family agreeable with plan.  CSW arranged ambulance transport via [facility] to [facility] .  RN to call report prior to discharge.  Clinical Social Worker will sign off for now as social work intervention is no longer needed. Please consult us again if new need arises.  Kennya Schwenn Mayton, LCSWA [phone]   

## 2016-10-28 NOTE — Progress Notes (Signed)
Occupational Therapy Treatment Patient Details Name: Monica [name] MRN: [id number] DOB: 09-May-1932 Today's Date: 10/28/2016    History of present illness Monica LimesMary S [name] is a 82 y.o. female who presents with physical deconditioning likely related to advancing C KD towards ESRD.   OT comments  This 82 yo female admitted with above is making progress with basic ADLs, mobility, and balance. She will continue to benefit from acute OT with follow up at SNF to get back to PLOF. Pt reports she received 2 units of blood during dialysis the AM.  Follow Up Recommendations  SNF    Equipment Recommendations  Other (comment) (TBD at next venue)       Precautions / Restrictions Precautions Precautions: Fall Precaution Comments: Reports "a bunch of falls" in the last 6 months.  "I'm always falling" Restrictions Weight Bearing Restrictions: No       Mobility Bed Mobility Overal bed mobility: Modified Independent             General bed mobility comments: HOB  Transfers Overall transfer level: Needs assistance Equipment used: Rolling walker (2 wheeled) Transfers: Sit to/from Stand Sit to Stand: Min guard         General transfer comment: cuees for correct hand placement.     Balance Overall balance assessment: Needs assistance Sitting-balance support: No upper extremity supported;Feet supported Sitting balance-Leahy Scale: Good     Standing balance support: Bilateral upper extremity supported;During functional activity   Standing balance comment: requires us of RW                   ADL Overall ADL's : Needs assistance/impaired                     Lower Body Dressing: Min guard;Sit to/from stand   Toilet Transfer: Min guard;Ambulation Toilet Transfer Details (indicate cue type and reason): bed>to recliner 5 feet away                            Cognition   Behavior During Therapy: WFL for tasks assessed/performed Overall Cognitive Status:  Within Functional Limits for tasks assessed                                    Pertinent Vitals/ Pain       Pain Assessment: 0-10 Pain Score: 2  Pain Location: left hip area Pain Descriptors / Indicators: Sore Pain Intervention(s): Monitored during session;Repositioned         Frequency  Min 2X/week        Progress Toward Goals  OT Goals(current goals can now be found in the care plan section)  Progress towards OT goals: Progressing toward goals     Plan Discharge plan remains appropriate       End of Session Equipment Utilized During Treatment: Gait belt;Rolling walker   Activity Tolerance Patient tolerated treatment well   Patient Left in chair;with chair alarm set;with call bell/phone within reach   Nurse Communication  (I re-ordered pt's lunch due to she was not happy with the one she received.\)        Time: 1241-1300 OT Time Calculation (min): 19 min  Charges: OT General Charges $OT Visit: 1 Procedure OT Treatments $Self Care/Home Management : 8-22 mins  Evette GeorgesLeonard, Janete Quilling Eva [phone] 10/28/2016, 1:20 PM

## 2016-10-28 NOTE — Assessment & Plan Note (Signed)
Outpatient dialysis scheduled 10/31/16

## 2016-10-28 NOTE — Assessment & Plan Note (Signed)
BP controlled; no change in antihypertensive medications Norvasc will continue to be held

## 2016-10-28 NOTE — Assessment & Plan Note (Signed)
See 10/28/16 dementia is not suggested. She'll be monitored and consideration given to stopping Aricept.

## 2016-10-28 NOTE — Progress Notes (Signed)
Nursing Home Location: [facility] Room Number: [id number]  MCKENZIE, [place], [place] [address] [id number]  10/28/16 he states she's been seeing Dr. Loleta ChanceHill but does not plan on seeing Dr. Ferd HibbsMcKinsey or Dr. Loleta ChanceHill in the future.  Code Status: Full code  This is a comprehensive admission note to [facility] performed on this date less than 30 days from date of admission. Included are preadmission medical/surgical history;reconciled medication list; family history; social history and comprehensive review of systems.  Corrections and additions to the records were documented . Comprehensive physical exam was also performed. Additionally a clinical summary was entered for each active diagnosis pertinent to this admission in the Problem List to enhance continuity of care.  HPI: The patient was hospitalized 11/16-11/21/17 for progressive worsening of her renal function with creatinine up to 10.61 and BUN greater than 80.  She described excessive urinary output attributed to daily Lasix. She did describe a weight loss from 180 pounds to 123 pounds over an unspecified period time which was unintentional.  A fistula had been created in the left upper extremity in April 2016. Dialysis was initiated 11/18. Dialysis at the outpatient facility will be initiated 11/24. Her unintentional weight loss was attributed to failure to thrive in the context of dementia (see comments below) and the chronic kidney disease. Hypertension was well controlled, Lopressor was continued. Norvasc was discontinued. Anemia was attributed to chronic kidney disease.  The day of discharge hemoglobin was 7 ; the patient had 2 units transfused with dialysis on 11/21.She was diagnosed with dementia but was felt to be alert and oriented 4 while hospitalized. She had been on Aricept  prior to admission "from Dr Loleta ChanceHill". She did exhibit hypokalemia with a value 2.5. This was  resolved. Creatinine at discharge was 5.65. In reference to the unexplained weight loss, TSH was therapeutic.  Past medical and surgical history:She did have a past history of prepyloric ulcer, H. pylori negative, in 2015 at EGD. Follow-up 2 months later revealed 90% healing. 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 She states that she tended [facility] for alcohol withdrawal after taking the matter to God. She has a history of pancreatic cyst, serial CTs suggest benign process. Also has history renal calculi.  Social history: Former smoker, nondrinker since rehabilitation  Family history: Reviewed; Mother, father, and son had alcoholism. Mother was a diabetic  Review of systems: Her major complaint is pain in the left lateral thorax which she relates to a fall. Apparently she slipped in her tub striking the left thorax. She denies any cardiac or neurologic prodrome prior to that fall. Also she describes pain in her knees with pain worse on the left down to the ankles.  She has occasional pain in the chest for which she takes nitroglycerin.  She denies having dementia.  Constitutional: No fever Eyes: No redness, discharge, pain, vision change ENT/mouth: No nasal congestion,  purulent discharge, earache,change in hearing ,sore throat  Cardiovascular: No palpitations,paroxysmal nocturnal dyspnea, claudication, edema  Respiratory: No cough, sputum production,hemoptysis, DOE , significant snoring,apnea  Gastrointestinal: No heartburn,dysphagia,abdominal pain, nausea / vomiting,rectal bleeding, melena,change in bowels Genitourinary: No dysuria,hematuria, pyuria,  incontinence, nocturia Dermatologic: No rash, pruritus, change in appearance of skin Neurologic: No dizziness,headache,syncope, seizures, numbness , tingling Psychiatric: No significant anxiety , depression,  insomnia, anorexia Endocrine: No change in hair/skin/ nails,  excessive thirst, excessive hunger  Hematologic/lymphatic: No significant bruising, lymphadenopathy,abnormal bleeding Allergy/immunology: No itchy/ watery eyes, significant sneezing, urticaria, angioedema  Physical exam:  Pertinent or positive findings:She is very laconic but fully oriented. She gave the date. She refused to name the president, stating she do not feel he should be president. She knew Obama was the last president. Her sclerae are ivory colored. Ptosis is present bilaterally, much greater on the right.  Teeth have increased plaque. Hygiene is fair-poor. She has a slow heart rhythm with an S4.  Abdomen is doughy and slightly protuberant. Non pitting edema is present in the lower extremities.   pedal pulses are decreased. Reflexes are 0+ at the knees. There is crepitus in the knees. Negative straight leg raising on the left. The left lower extremity is weaker than the right.  General appearance:Adequately nourished; no acute distress , increased work of breathing is present.   Lymphatic: No lymphadenopathy about the head, neck, axilla . Eyes: No conjunctival inflammation or lid edema is present. There is no scleral icterus. Ears:  External ear exam shows no significant lesions or deformities.   Nose:  External nasal examination shows no deformity or inflammation. Nasal mucosa are pink and moist without lesions ,exudates Oral exam: lips and gums are healthy appearing.There is no oropharyngeal erythema or exudate . Neck:  No thyromegaly, masses, tenderness noted.    Heart:   regular rhythm. S1 and S2 normal without gallop, murmur, click, rub .  Lungs:Chest clear to auscultation without wheezes, rhonchi,rales , rubs. She is not splinting on the left.  Abdomen:Bowel sounds are normal. Abdomen is soft and nontender with no organomegaly, hernias,masses. GU: deferred. Extremities:  No cyanosis, clubbing  Neurologic exam  : Balance,Rhomberg,finger to nose testing could not be completed due to clinical state Deep tendon reflexes are equal Skin: Warm & dry w/o tenting. No significant lesions or rash.  See clinical summary under each active problem in the Problem List with associated updated therapeutic plan

## 2016-10-29 LAB — TYPE AND SCREEN
ABO/RH(D): A POS
ANTIBODY SCREEN: NEGATIVE
UNIT DIVISION: 0
Unit division: 0

## 2016-11-12 ENCOUNTER — Other Ambulatory Visit: Payer: Self-pay | Admitting: *Deleted

## 2016-11-12 MED ORDER — HYDROCODONE-ACETAMINOPHEN 5-325 MG PO TABS: ORAL_TABLET | ORAL | 0 refills | Status: DC

## 2016-11-12 NOTE — Telephone Encounter (Signed)
[facility] [phone] Fax: [phone]  

## 2016-11-24 ENCOUNTER — Other Ambulatory Visit: Payer: Self-pay | Admitting: *Deleted

## 2016-11-25 ENCOUNTER — Ambulatory Visit (HOSPITAL_COMMUNITY): Payer: Medicare Other | Admitting: Certified Registered"

## 2016-11-25 ENCOUNTER — Ambulatory Visit (HOSPITAL_COMMUNITY): Payer: Medicare Other

## 2016-11-25 ENCOUNTER — Encounter (HOSPITAL_COMMUNITY)
Admission: RE | Disposition: A | Discharge status: Home / Self Care | Payer: Self-pay | Source: Ambulatory Visit | Attending: Vascular Surgery

## 2016-11-25 ENCOUNTER — Encounter (HOSPITAL_COMMUNITY): Payer: Self-pay | Admitting: *Deleted

## 2016-11-25 ENCOUNTER — Ambulatory Visit (HOSPITAL_COMMUNITY)
Admission: RE | Admit: 2016-11-25 | Discharge: 2016-11-25 | Disposition: A | Discharge status: Home / Self Care | Payer: Medicare Other | Source: Ambulatory Visit | Attending: Vascular Surgery | Admitting: Vascular Surgery

## 2016-11-25 DIAGNOSIS — N186 End stage renal disease: Secondary | ICD-10-CM | POA: Diagnosis not present

## 2016-11-25 DIAGNOSIS — Z992 Dependence on renal dialysis: Secondary | ICD-10-CM | POA: Insufficient documentation

## 2016-11-25 DIAGNOSIS — K219 Gastro-esophageal reflux disease without esophagitis: Secondary | ICD-10-CM | POA: Diagnosis not present

## 2016-11-25 DIAGNOSIS — F1021 Alcohol dependence, in remission: Secondary | ICD-10-CM | POA: Insufficient documentation

## 2016-11-25 DIAGNOSIS — Z09 Encounter for follow-up examination after completed treatment for conditions other than malignant neoplasm: Secondary | ICD-10-CM

## 2016-11-25 DIAGNOSIS — Z87891 Personal history of nicotine dependence: Secondary | ICD-10-CM | POA: Insufficient documentation

## 2016-11-25 DIAGNOSIS — Z9889 Other specified postprocedural states: Secondary | ICD-10-CM

## 2016-11-25 DIAGNOSIS — F0390 Unspecified dementia without behavioral disturbance: Secondary | ICD-10-CM | POA: Insufficient documentation

## 2016-11-25 DIAGNOSIS — N185 Chronic kidney disease, stage 5: Secondary | ICD-10-CM | POA: Diagnosis not present

## 2016-11-25 DIAGNOSIS — I120 Hypertensive chronic kidney disease with stage 5 chronic kidney disease or end stage renal disease: Secondary | ICD-10-CM | POA: Diagnosis not present

## 2016-11-25 DIAGNOSIS — Z79899 Other long term (current) drug therapy: Secondary | ICD-10-CM | POA: Diagnosis not present

## 2016-11-25 HISTORY — PX: INSERTION OF DIALYSIS CATHETER: SHX1324

## 2016-11-25 LAB — POCT I-STAT 4, (NA,K, GLUC, HGB,HCT)
Glucose, Bld: 90 mg/dL (ref 65–99)
HCT: 23 % — ABNORMAL LOW (ref 36.0–46.0)
HEMOGLOBIN: 7.8 g/dL — AB (ref 12.0–15.0)
POTASSIUM: 4.7 mmol/L (ref 3.5–5.1)
Sodium: 138 mmol/L (ref 135–145)

## 2016-11-25 SURGERY — INSERTION OF DIALYSIS CATHETER
Anesthesia: Monitor Anesthesia Care | Site: Neck | Laterality: Right

## 2016-11-25 MED ORDER — EPHEDRINE SULFATE 50 MG/ML IJ SOLN
INTRAMUSCULAR | Status: DC | PRN
  Administered 2016-11-25: 10 mg via INTRAVENOUS

## 2016-11-25 MED ORDER — FENTANYL CITRATE (PF) 100 MCG/2ML IJ SOLN
INTRAMUSCULAR | Status: AC
  Filled 2016-11-25: qty 2

## 2016-11-25 MED ORDER — 0.9 % SODIUM CHLORIDE (POUR BTL) OPTIME
TOPICAL | Status: DC | PRN
  Administered 2016-11-25: 1000 mL

## 2016-11-25 MED ORDER — SODIUM CHLORIDE 0.9 % IV SOLN
INTRAVENOUS | Status: DC
  Administered 2016-11-25: 10:00:00 via INTRAVENOUS

## 2016-11-25 MED ORDER — METOPROLOL TARTRATE 25 MG PO TABS
25.0000 mg | ORAL_TABLET | Freq: Once | ORAL | Status: AC
  Administered 2016-11-25: 25 mg via ORAL
  Filled 2016-11-25: qty 1

## 2016-11-25 MED ORDER — PROPOFOL 500 MG/50ML IV EMUL
INTRAVENOUS | Status: DC | PRN
  Administered 2016-11-25: 75 ug/kg/min via INTRAVENOUS

## 2016-11-25 MED ORDER — FENTANYL CITRATE (PF) 100 MCG/2ML IJ SOLN
INTRAMUSCULAR | Status: DC | PRN
  Administered 2016-11-25 (×4): 25 ug via INTRAVENOUS

## 2016-11-25 MED ORDER — HYDROMORPHONE HCL 2 MG/ML IJ SOLN
INTRAMUSCULAR | Status: AC
  Filled 2016-11-25: qty 1

## 2016-11-25 MED ORDER — PHENYLEPHRINE HCL 10 MG/ML IJ SOLN
INTRAVENOUS | Status: DC | PRN
  Administered 2016-11-25: 20 ug/min via INTRAVENOUS

## 2016-11-25 MED ORDER — DEXTROSE 5 % IV SOLN
1.5000 g | INTRAVENOUS | Status: AC
  Administered 2016-11-25: 1.5 g via INTRAVENOUS

## 2016-11-25 MED ORDER — HEPARIN SODIUM (PORCINE) 1000 UNIT/ML IJ SOLN
INTRAMUSCULAR | Status: DC | PRN
  Administered 2016-11-25: 1000 [IU]

## 2016-11-25 MED ORDER — METOPROLOL TARTRATE 12.5 MG HALF TABLET
ORAL_TABLET | ORAL | Status: AC
  Administered 2016-11-25: 25 mg via ORAL
  Filled 2016-11-25: qty 2

## 2016-11-25 MED ORDER — PROPOFOL 10 MG/ML IV BOLUS
INTRAVENOUS | Status: DC | PRN
  Administered 2016-11-25 (×2): 50 mg via INTRAVENOUS

## 2016-11-25 MED ORDER — PROMETHAZINE HCL 25 MG/ML IJ SOLN
6.2500 mg | INTRAMUSCULAR | Status: DC | PRN
Start: 2016-11-25 — End: 2016-11-25

## 2016-11-25 MED ORDER — LIDOCAINE HCL 1 % IJ SOLN
INTRAMUSCULAR | Status: DC | PRN
  Administered 2016-11-25: 30 mL

## 2016-11-25 MED ORDER — CHLORHEXIDINE GLUCONATE 4 % EX LIQD: 1.0000 "application " | Freq: Once | CUTANEOUS | Status: DC

## 2016-11-25 MED ORDER — DEXTROSE 5 % IV SOLN
INTRAVENOUS | Status: AC
  Filled 2016-11-25: qty 1.5

## 2016-11-25 MED ORDER — HEPARIN SODIUM (PORCINE) 5000 UNIT/ML IJ SOLN
INTRAMUSCULAR | Status: DC | PRN
  Administered 2016-11-25: 12:00:00 500 mL

## 2016-11-25 MED ORDER — LIDOCAINE HCL (PF) 1 % IJ SOLN
INTRAMUSCULAR | Status: AC
  Filled 2016-11-25: qty 30

## 2016-11-25 MED ORDER — HYDROMORPHONE HCL 1 MG/ML IJ SOLN
0.2500 mg | INTRAMUSCULAR | Status: DC | PRN
  Administered 2016-11-25 (×3): 0.5 mg via INTRAVENOUS

## 2016-11-25 MED ORDER — HEPARIN SODIUM (PORCINE) 1000 UNIT/ML IJ SOLN
INTRAMUSCULAR | Status: AC
  Filled 2016-11-25: qty 1

## 2016-11-25 MED ORDER — PROPOFOL 10 MG/ML IV BOLUS
INTRAVENOUS | Status: AC
  Filled 2016-11-25: qty 20

## 2016-11-25 MED ORDER — MIDAZOLAM HCL 5 MG/5ML IJ SOLN
INTRAMUSCULAR | Status: DC | PRN
  Administered 2016-11-25: 2 mg via INTRAVENOUS

## 2016-11-25 MED ORDER — EPHEDRINE 5 MG/ML INJ
INTRAVENOUS | Status: AC
  Filled 2016-11-25: qty 10

## 2016-11-25 MED ORDER — MIDAZOLAM HCL 2 MG/2ML IJ SOLN
INTRAMUSCULAR | Status: AC
  Filled 2016-11-25: qty 2

## 2016-11-25 SURGICAL SUPPLY — 52 items
ADH SKN CLS APL DERMABOND .7 (GAUZE/BANDAGES/DRESSINGS) ×2
BAG DECANTER FOR FLEXI CONT (MISCELLANEOUS) ×3 IMPLANT
BIOPATCH RED 1 DISK 7.0 (GAUZE/BANDAGES/DRESSINGS) ×2 IMPLANT
BIOPATCH RED 1IN DISK 7.0MM (GAUZE/BANDAGES/DRESSINGS) ×1
CATH ANGIO 5F BER2 65CM (CATHETERS) ×2 IMPLANT
CATH PALINDROME RT-P 15FX19CM (CATHETERS) IMPLANT
CATH PALINDROME RT-P 15FX23CM (CATHETERS) ×2 IMPLANT
CATH PALINDROME RT-P 15FX28CM (CATHETERS) IMPLANT
CATH PALINDROME RT-P 15FX55CM (CATHETERS) IMPLANT
CATH STRAIGHT 5FR 65CM (CATHETERS) ×2 IMPLANT
COVER PROBE W GEL 5X96 (DRAPES) ×3 IMPLANT
COVER SURGICAL LIGHT HANDLE (MISCELLANEOUS) ×3 IMPLANT
DERMABOND ADVANCED (GAUZE/BANDAGES/DRESSINGS) ×4
DERMABOND ADVANCED .7 DNX12 (GAUZE/BANDAGES/DRESSINGS) IMPLANT
DRAPE C-ARM 42X72 X-RAY (DRAPES) ×3 IMPLANT
DRAPE CHEST BREAST 15X10 FENES (DRAPES) ×3 IMPLANT
DRSG COVADERM 4X6 (GAUZE/BANDAGES/DRESSINGS) ×2 IMPLANT
GAUZE SPONGE 2X2 8PLY STRL LF (GAUZE/BANDAGES/DRESSINGS) IMPLANT
GAUZE SPONGE 4X4 16PLY XRAY LF (GAUZE/BANDAGES/DRESSINGS) ×3 IMPLANT
GLOVE BIO SURGEON STRL SZ 6.5 (GLOVE) ×3 IMPLANT
GLOVE BIO SURGEON STRL SZ7 (GLOVE) ×3 IMPLANT
GLOVE BIO SURGEONS STRL SZ 6.5 (GLOVE) ×3
GLOVE BIOGEL PI IND STRL 6.5 (GLOVE) IMPLANT
GLOVE BIOGEL PI IND STRL 7.5 (GLOVE) ×1 IMPLANT
GLOVE BIOGEL PI INDICATOR 6.5 (GLOVE) ×6
GLOVE BIOGEL PI INDICATOR 7.5 (GLOVE) ×2
GOWN STRL REUS W/ TWL LRG LVL3 (GOWN DISPOSABLE) ×2 IMPLANT
GOWN STRL REUS W/TWL LRG LVL3 (GOWN DISPOSABLE) ×12
KIT BASIN OR (CUSTOM PROCEDURE TRAY) ×3 IMPLANT
KIT ROOM TURNOVER OR (KITS) ×3 IMPLANT
NDL 18GX1X1/2 (RX/OR ONLY) (NEEDLE) ×1 IMPLANT
NDL HYPO 25GX1X1/2 BEV (NEEDLE) ×1 IMPLANT
NEEDLE 18GX1X1/2 (RX/OR ONLY) (NEEDLE) ×3 IMPLANT
NEEDLE HYPO 25GX1X1/2 BEV (NEEDLE) ×3 IMPLANT
NS IRRIG 1000ML POUR BTL (IV SOLUTION) ×3 IMPLANT
PACK SURGICAL SETUP 50X90 (CUSTOM PROCEDURE TRAY) ×3 IMPLANT
PAD ARMBOARD 7.5X6 YLW CONV (MISCELLANEOUS) ×6 IMPLANT
SET MICROPUNCTURE 5F STIFF (MISCELLANEOUS) IMPLANT
SOAP 2 % CHG 4 OZ (WOUND CARE) ×3 IMPLANT
SPONGE GAUZE 2X2 STER 10/PKG (GAUZE/BANDAGES/DRESSINGS)
SUT ETHILON 3 0 PS 1 (SUTURE) ×3 IMPLANT
SUT MNCRL AB 4-0 PS2 18 (SUTURE) ×3 IMPLANT
SYR 20CC LL (SYRINGE) ×6 IMPLANT
SYR 3ML LL SCALE MARK (SYRINGE) ×3 IMPLANT
SYR 5ML LL (SYRINGE) ×5 IMPLANT
SYR CONTROL 10ML LL (SYRINGE) ×3 IMPLANT
SYRINGE 10CC LL (SYRINGE) ×3 IMPLANT
TOWEL OR 17X24 6PK STRL BLUE (TOWEL DISPOSABLE) ×3 IMPLANT
TOWEL OR 17X26 4PK STRL BLUE (TOWEL DISPOSABLE) ×3 IMPLANT
WATER STERILE IRR 1000ML POUR (IV SOLUTION) ×3 IMPLANT
WIRE AMPLATZ SS-J .035X180CM (WIRE) ×2 IMPLANT
WIRE BENTSON .035X145CM (WIRE) ×2 IMPLANT

## 2016-11-25 NOTE — Anesthesia Postprocedure Evaluation (Signed)
Anesthesia Post Note  Patient: Monica LimesMary S [name]  Procedure(s) Performed: Procedure(s) (LRB): INSERTION OF Right Internal jugular DIALYSIS CATHETER (Right)  Patient location during evaluation: PACU Anesthesia Type: MAC Level of consciousness: awake and alert Pain management: pain level controlled Vital Signs Assessment: post-procedure vital signs reviewed and stable Respiratory status: spontaneous breathing, nonlabored ventilation, respiratory function stable and patient connected to nasal cannula oxygen Cardiovascular status: stable and blood pressure returned to baseline Anesthetic complications: no       Last Vitals:  Vitals:   11/25/16 1307 11/25/16 1315  BP: 133/62 (!) 143/69  Pulse: 70 67  Resp: 16 16  Temp: 36.6 C     Last Pain:  Vitals:   11/25/16 1307  TempSrc:   PainSc: 0-No pain                 Monica [name]

## 2016-11-25 NOTE — Op Note (Signed)
OPERATIVE NOTE  PROCEDURE: 1. Left internal jugular vein tunneled dialysis catheter placement 2. Bilateral internal jugular vein cannulation under ultrasound guidance  PRE-OPERATIVE DIAGNOSIS: end-stage renal failure  POST-OPERATIVE DIAGNOSIS: same as above  SURGEON: Leonides SakeBrian Terell Kincy, MD  ANESTHESIA: local and MAC  ESTIMATED BLOOD LOSS: 30 cc  FINDING(S): 1.  Tips of the catheter in the right atrium on fluoroscopy 2.  No obvious pneumothorax on fluoroscopy 3.  Right angle turn of left innominate vein 4.  High resistance with passage of dilators in right internal jugular vein leading abort of right internal jugular vein tunneled dialysis catheter placement  SPECIMEN(S):  none  INDICATIONS:   Monica [name] is a 82 y.o. female who presents with end stage renal disease.  The patient presents for tunneled dialysis catheter placement.  The patient is aware the risks of tunneled dialysis catheter placement include but are not limited to: bleeding, infection, central venous injury, pneumothorax, possible venous stenosis, possible malpositioning in the venous system, and possible infections related to long-term catheter presence.  The patient was aware of these risks and agreed to proceed.  DESCRIPTION: After written full informed consent was obtained from the patient, the patient was taken back to the operating room.  Prior to induction, the patient was given IV antibiotics.  After obtaining adequate sedation, the patient was prepped and draped in the standard fashion for a chest or neck tunneled dialysis catheter placement.   The cannulation site, the catheter exit site, and tract for the subcutaneous tunnel were then anesthestized with a total of 30 cc of 1% lidocaine without epinephrine.   Under ultrasound guidance, I cannulated the right internal jugular vein.  There was some resistance to passage of the wire but it went into the inferior vena cava without difficulty.  I then tried to pass  the small dilator over the wire.  There was significant resistance with some deflection of the wire, so elected to place a straight catheter over the wire and exchange the wire for an Amplatz wire.  I then tried to pass the medium dilator over the wire.  There was even more resistance than previously, so I elected to abort placement of the tunneled dialysis catheter on this side to avoid a possible innominate vein injury.  I removed the wire and held pressure to the right neck for 3 minutes.  I turned my attention to the left neck.  Under ultrasound guidance, the left internal jugular vein was cannulated with the 18 gauge needle.  A J-wire was then placed down in the right atrium and the wire would not pass into the ventricle.  I placed a BER-2 catheter over the wire and tried to redirect the J-wire into the right ventricle.  I had to exchange the wire for a Bentson wire before I could redirect the wire into the inferior vena cava under fluoroscopic guidance.  The wire was then secured in place with a clamp to the drapes.  I then made stab incisions at the neck and exit sites.  I dissected from the exit site to the cannulation site with a tunneler.   The subcutaneous tunnel was dilated by passing a plastic dilator over the tunneler.  The wire was then unclamped and I removed the needle.  An end-hole catheter was loaded over the wire and advanced into the right atrium.  The wire was then exchanged for an Amplatz super stiff wire.  The catheter was then removed.  Over the wire, the skin tract  and venotomy was dilated serially with dilators.  Finally, the dilator-sheath was placed over the wire under fluoroscopic guidance into the superior vena cava.  The dilator was removed.  A 23 cm Palindrome catheter was woven over the wire and advanced under fluoroscopic guidance down into the right atrium.  The wire was then removed, and the sheath was broken and peeled away while holding the catheter cuff at the level of the  skin.  The back end of this catheter was transected and docked onto the dissector.  The distal end of the catheter was delivered through the subcutaneous tunnel.  The catheter was transected a second time, revealing the two lumens of this catheter.  The ports were docked onto these two lumens.  The catheter collar was then snapped into place.  Each port was tested by aspirating and flushing.  No resistance was noted.  Each port was then thoroughly flushed with heparinized saline.  The catheter was secured in placed with two interrupted stitches of 3-0 Nylon tied to the catheter.  The neck incision was closed with a U-stitch of 4-0 Monocryl.  The neck and chest incisions were cleaned and sterile bandages applied.  Each port was then loaded with concentrated heparin (1000 Units/mL) at the manufacturer recommended volumes to each port.  Sterile caps were applied to each port.  On completion fluoroscopy, the tips of the catheter were in the right atrium, and there was no evidence of pneumothorax.   COMPLICATIONS: none  CONDITION: stable   Leonides SakeBrian Raylan Hanton, MD [facility] Office: [phone] Pager: [phone]  11/25/2016, 12:56 PM

## 2016-11-25 NOTE — Anesthesia Preprocedure Evaluation (Addendum)
Anesthesia Evaluation  Patient identified by MRN, date of birth, ID band Patient awake    Reviewed: Allergy & Precautions, NPO status , Patient's Chart, lab work & pertinent test results, reviewed documented beta blocker date and time   History of Anesthesia Complications Negative for: history of anesthetic complications  Airway Mallampati: II  TM Distance: >3 FB Neck ROM: Full    Dental  (+) Dental Advisory Given, Caps, Teeth Intact   Pulmonary former smoker,    Pulmonary exam normal        Cardiovascular hypertension, Pt. on medications and Pt. on home beta blockers Normal cardiovascular exam+ dysrhythmias Atrial Fibrillation      Neuro/Psych PSYCHIATRIC DISORDERS    GI/Hepatic PUD, GERD  Medicated and Controlled,  Endo/Other    Renal/GU ESRFRenal disease     Musculoskeletal  (+) Arthritis ,   Abdominal   Peds  Hematology  (+) anemia ,   Anesthesia Other Findings   Reproductive/Obstetrics                            Anesthesia Physical  Anesthesia Plan  ASA: III  Anesthesia Plan: MAC   Post-op Pain Management:    Induction: Intravenous  Airway Management Planned: Natural Airway and Simple Face Mask  Additional Equipment:   Intra-op Plan:   Post-operative Plan:   Informed Consent: I have reviewed the patients History and Physical, chart, labs and discussed the procedure including the risks, benefits and alternatives for the proposed anesthesia with the patient or authorized representative who has indicated his/her understanding and acceptance.   Dental advisory given  Plan Discussed with: Surgeon and CRNA  Anesthesia Plan Comments:         Anesthesia Quick Evaluation

## 2016-11-25 NOTE — H&P (Signed)
Brief History and Physical  History of Present Illness  Monica [name] is a 82 y.o. female who presents with chief complaint: ESRD.  The patient presents today for tunneled dialysis catheter placement.    Pt previous had a L BC AVF placed by Dr. Edilia Boickson.  This fistula had clotted.  Past Medical History:  Diagnosis Date  . Arthritis   . Atrial flutter (HCC)   . CKD (chronic kidney disease) stage 4, GFR 15-29 ml/min (HCC)   . Dementia   . ETOHism (HCC)    Quit in the 1980s.  . Gastric ulcer 12/2013.   Prepyloric ulcer. On follow-up EGD in 02/2014 this was 90% healed  . GERD (gastroesophageal reflux disease)   . Hemorrhoids   . Hypertension   . Nephrolithiasis   . Pancreatic cyst 09/2010.   At uncinate process.  Serial CT imaging favors benign process    Past Surgical History:  Procedure Laterality Date  . AV FISTULA PLACEMENT Left 03/20/2015   Procedure: ARTERIOVENOUS (AV) FISTULA CREATION;  Surgeon: Chuck Hinthristopher S Dickson, MD;  [facility];  Service: Vascular;  Laterality: Left;  . COLONOSCOPY  2010   Per Dr. Loreta AveMann  . ESOPHAGOGASTRODUODENOSCOPY N/A 12/29/2013   Procedure: ESOPHAGOGASTRODUODENOSCOPY (EGD);  Surgeon: Hart Carwinora M Brodie, MD;  [facility];  Service: Endoscopy;  Laterality: N/A;  . Exploratory laparotomy with lysis of adhesions  1980  . FLEXIBLE SIGMOIDOSCOPY  08/2011   Per Dr. Randa EvensEdwards. Normal study. Random biopsies positive for trace melanosis coli.  . Surgical pinning  1996   Foot  . TOTAL ABDOMINAL HYSTERECTOMY W/ BILATERAL SALPINGOOPHORECTOMY  1980    Social History   Social History  . Marital status: Married    Spouse name: N/A  . Number of children: N/A  . Years of education: N/A   Occupational History  . Not on file.   Social History Main Topics  . Smoking status: Former Smoker    Quit date: 02/14/1986  . Smokeless tobacco: Never Used  . Alcohol use No     Comment: former drinker  . Drug use: No  . Sexual activity: Not Currently    Other Topics Concern  . Not on file   Social History Narrative  . No narrative on file    Family History  Problem Relation Age of Onset  . Alcohol abuse Mother     No current facility-administered medications on file prior to encounter.    Current Outpatient Prescriptions on File Prior to Encounter  Medication Sig Dispense Refill  . acetaminophen (TYLENOL) 500 MG tablet Take 1,000 mg by mouth every 6 (six) hours as needed for mild pain.    Marland Kitchen. ALPRAZolam (XANAX) 0.5 MG tablet Take 1 tablet (0.5 mg total) by mouth 2 (two) times daily. 10 tablet 0  . HYDROcodone-acetaminophen (NORCO/VICODIN) 5-325 MG tablet Take one to two tablets by mouth every 12 hours as needed for pain. Do not exceed 3gm of APAP/24 hours (Patient taking differently: Take 2 tablets by mouth every 12 (twelve) hours as needed for moderate pain. ) 60 tablet 0  . metoprolol tartrate (LOPRESSOR) 25 MG tablet Take 1 tablet (25 mg total) by mouth 2 (two) times daily. 60 tablet 0  . miconazole (MICOTIN) 2 % cream Apply 1 application topically 2 (two) times daily. 28.35 g 0  . nitroGLYCERIN (NITROSTAT) 0.4 MG SL tablet Place 0.4 mg under the tongue every 5 (five) minutes as needed for chest pain.    Marland Kitchen. NYAMYC powder Apply 1  application topically at bedtime.    . pantoprazole (PROTONIX) 40 MG tablet Take 1 tablet (40 mg total) by mouth daily. 30 tablet 3  . Tetrahydrozoline HCl (VISINE OP) Place 2 drops into both eyes daily as needed (dry eyes).      Allergies  Allergen Reactions  . Aspirin Other (See Comments)    "makes my stomach hurt"  . Codeine Other (See Comments)    "i get irritated"    Review of Systems: As listed above, otherwise negative.  Physical Examination  Vitals:   11/25/16 1006  BP: (!) 153/52  Pulse: 64  Resp: 18  Temp: 98.1 F (36.7 C)  TempSrc: Oral  SpO2: 100%  Weight: 127 lb (57.6 kg)  Height: 5\' 1"  (1.549 m)    General: A&O x 3, WDWN  Pulmonary: Sym exp, good air movt, CTAB, no  rales, rhonchi, & wheezing  Cardiac: RRR, Nl S1, S2, no Murmurs, rubs or gallops  Gastrointestinal: soft, NTND, -G/R, - HSM, - masses, - CVAT B  Musculoskeletal: M/S 5/5 throughout , Extremities without ischemic changes, L BC AVF thrombosed with palpable clot.  No bruit or thrill  Laboratory See iStat   Medical Decision Making  Monica LimesMary S [name] is a 82 y.o. female who presents with: ESRD.   The patient is scheduled for: tunneled dialysis catheter  The patient is aware the risks of tunneled dialysis catheter placement include but are not limited to: bleeding, infection, central venous injury, pneumothorax, possible venous stenosis, possible malpositioning in the venous system, and possible infections related to long-term catheter presence.   The patient is aware of the risks and agrees to proceed.  Leonides SakeBrian Milferd Ansell, MD [facility] [place] [facility] Office: [phone] Pager: [phone]  11/25/2016, 11:15 AM

## 2016-11-25 NOTE — Anesthesia Procedure Notes (Signed)
Procedure Name: MAC Date/Time: 11/25/2016 11:53 AM Performed by: Rosiland OzMEYERS, Allayna Erlich Pre-anesthesia Checklist: Patient identified, Emergency Drugs available, Timeout performed, Patient being monitored and Suction available Patient Re-evaluated:Patient Re-evaluated prior to inductionOxygen Delivery Method: Simple face mask

## 2016-11-25 NOTE — Transfer of Care (Signed)
Immediate Anesthesia Transfer of Care Note  Patient: Monica [name]  Procedure(s) Performed: Procedure(s): INSERTION OF Right Internal jugular DIALYSIS CATHETER (Right)  Patient Location: PACU  Anesthesia Type:MAC  Level of Consciousness: awake, alert , oriented and patient cooperative  Airway & Oxygen Therapy: Patient Spontanous Breathing and Patient connected to nasal cannula oxygen  Post-op Assessment: Report given to RN, Post -op Vital signs reviewed and stable and Patient moving all extremities X 4  Post vital signs: Reviewed and stable  Last Vitals:  Vitals:   11/25/16 1006 11/25/16 1307  BP: (!) 153/52 (P) 133/62  Pulse: 64 (P) 70  Resp: 18 (P) 16  Temp: 36.7 C (P) 36.6 C    Last Pain:  Vitals:   11/25/16 1012  TempSrc:   PainSc: 2       Patients Stated Pain Goal: 2 (11/25/16 1012)  Complications: No apparent anesthesia complications

## 2016-11-26 ENCOUNTER — Other Ambulatory Visit: Payer: Self-pay | Admitting: *Deleted

## 2016-11-26 ENCOUNTER — Encounter (HOSPITAL_COMMUNITY): Payer: Self-pay | Admitting: Vascular Surgery

## 2016-11-26 DIAGNOSIS — N186 End stage renal disease: Secondary | ICD-10-CM

## 2016-11-26 DIAGNOSIS — Z01810 Encounter for preprocedural cardiovascular examination: Secondary | ICD-10-CM

## 2016-11-27 ENCOUNTER — Inpatient Hospital Stay (HOSPITAL_COMMUNITY): Admit: 2016-11-27 | Payer: Medicare Other

## 2016-11-27 ENCOUNTER — Encounter: Payer: Medicare Other | Admitting: Vascular Surgery

## 2016-12-04 ENCOUNTER — Ambulatory Visit (HOSPITAL_COMMUNITY)
Admission: RE | Admit: 2016-12-04 | Discharge: 2016-12-04 | Disposition: A | Discharge status: Home / Self Care | Payer: Medicare Other | Source: Ambulatory Visit | Attending: Vascular Surgery | Admitting: Vascular Surgery

## 2016-12-04 DIAGNOSIS — Z01810 Encounter for preprocedural cardiovascular examination: Secondary | ICD-10-CM | POA: Diagnosis present

## 2016-12-04 DIAGNOSIS — N186 End stage renal disease: Secondary | ICD-10-CM

## 2016-12-09 ENCOUNTER — Non-Acute Institutional Stay (SKILLED_NURSING_FACILITY): Payer: Medicare Other | Admitting: Internal Medicine

## 2016-12-09 ENCOUNTER — Encounter: Payer: Self-pay | Admitting: Internal Medicine

## 2016-12-09 DIAGNOSIS — R0781 Pleurodynia: Secondary | ICD-10-CM

## 2016-12-09 DIAGNOSIS — J209 Acute bronchitis, unspecified: Secondary | ICD-10-CM

## 2016-12-09 DIAGNOSIS — S299XXD Unspecified injury of thorax, subsequent encounter: Secondary | ICD-10-CM | POA: Diagnosis not present

## 2016-12-09 NOTE — Progress Notes (Signed)
    Facility Location: [facility]  Room Number: 223-A  Code Status: Full Code   This is a nursing facility follow up for specific acute issue of cough and fever.  Interim medical record and care since [facility] was updated with review of diagnostic studies and change in clinical status since last visit were documented.  HPI: She describes cough productive of yellow and green sputum. She has no upper respiratory tract symptoms. She continues to have left-sided chest pain with inspirations. This is been persistent since she fell and struck the posterior thorax in late October or early November. Portable imaging 10/28/16  revealed no rib fractures or active lung disease as per report. I personally reviewed those films today. There was blunting of the left costophrenic angle. Osteopenia of the ribs was present. A hairline fracture of the inferior ribs on the left cannot be definitely ruled out based on these images.  Review of systems: She did say that she has had constipation until one bout of diarrhea which was apparently self-limited. She describes some dysuria. Eyes: No redness, discharge, pain, vision change ENT/mouth: No nasal congestion,  purulent discharge, earache,change in hearing ,sore throat  Cardiovascular: No chest pain, palpitations,paroxysmal nocturnal dyspnea, claudication, edema  Respiratory: No hemoptysis Gastrointestinal: No heartburn,dysphagia,abdominal pain, nausea / vomiting,rectal bleeding, melena. Genitourinary: No hematuria, pyuria,  incontinence, nocturia Dermatologic: No rash, pruritus, change in appearance of skin Allergy/immunology: No itchy/ watery eyes, significant sneezing, urticaria, angioedema  Physical exam:  Pertinent or positive findings: She has bilateral ptosis.Sclerae are subtly pigmented as if semi opaque. She has minor rhonchi but no rub. A dialysis catheter is present at the left supraclavicular  area. Pedal pulses are decreased. There is scar with evidence of tibial surface irregularity of the right shin.  General appearance:Adequately nourished; no acute distress , increased work of breathing is present.   Lymphatic: No lymphadenopathy about the head, neck, axilla . Eyes: No conjunctival inflammation or lid edema is present. There is no scleral icterus. Ears:  External ear exam shows no significant lesions or deformities.   Nose:  External nasal examination shows no deformity or inflammation. Nasal mucosa are pink and moist without lesions ,exudates Oral exam: lips and gums are healthy appearing.There is no oropharyngeal erythema or exudate . Neck:  No thyromegaly, masses, tenderness noted.    Heart:  Normal rate and regular rhythm. S1 and S2 normal without gallop, murmur, click, rub .  Abdomen:Bowel sounds are normal. Abdomen is soft and nontender with no organomegaly, hernias,masses. GU: deferred  Extremities:  No cyanosis, clubbing,edema  Deep tendon reflexes are equal Skin: Warm & dry w/o tenting. No significant lesions or rash.  #1 acute bronchitis w/o bronchospasm #2 posttraumatic left pleuritic chest pain Plan: Repeat chest x-ray Robitussin-DM, Augmentin 7 days

## 2016-12-09 NOTE — Patient Instructions (Signed)
See Current Assessment & Plan under specific Diagnosis 

## 2016-12-12 ENCOUNTER — Other Ambulatory Visit: Payer: Self-pay | Admitting: *Deleted

## 2016-12-12 MED ORDER — HYDROCODONE-ACETAMINOPHEN 5-325 MG PO TABS: 1.0000 | ORAL_TABLET | Freq: Two times a day (BID) | ORAL | 0 refills | Status: DC | PRN

## 2016-12-12 NOTE — Telephone Encounter (Signed)
[facility] [phone] Fax: [phone]  

## 2016-12-18 ENCOUNTER — Encounter: Payer: Self-pay | Admitting: Vascular Surgery

## 2016-12-23 ENCOUNTER — Encounter: Payer: Medicare Other | Admitting: Vascular Surgery

## 2016-12-30 ENCOUNTER — Encounter: Payer: Medicare Other | Admitting: Vascular Surgery

## 2016-12-30 ENCOUNTER — Telehealth: Payer: Self-pay | Admitting: Vascular Surgery

## 2016-12-30 NOTE — Telephone Encounter (Signed)
[facility] no showed to today's appt. The patient has been discharged from the facility as of 12/29/16

## 2017-01-02 ENCOUNTER — Other Ambulatory Visit: Payer: Self-pay

## 2017-01-02 MED ORDER — ALPRAZOLAM 0.5 MG PO TABS: 0.5000 mg | ORAL_TABLET | Freq: Two times a day (BID) | ORAL | 0 refills | Status: DC

## 2017-01-02 NOTE — Telephone Encounter (Signed)
Prescription request was received from:    [facility] [address] [place] [address]  Phone: [phone] Fax: [phone] 

## 2017-01-13 ENCOUNTER — Encounter: Payer: Self-pay | Admitting: Vascular Surgery

## 2017-01-20 ENCOUNTER — Other Ambulatory Visit: Payer: Self-pay

## 2017-01-20 ENCOUNTER — Ambulatory Visit (INDEPENDENT_AMBULATORY_CARE_PROVIDER_SITE_OTHER): Payer: Medicare Other | Admitting: Vascular Surgery

## 2017-01-20 VITALS — BP 120/70 | HR 69 | Temp 98.3°F | Resp 16 | Ht 61.0 in | Wt 130.0 lb

## 2017-01-20 DIAGNOSIS — N186 End stage renal disease: Secondary | ICD-10-CM

## 2017-01-20 NOTE — Progress Notes (Signed)
[facility]  Patient name: Monica [name] [place]ountree MRN: [id number] DOB: 1932/04/23 Sex: female  REASON FOR VISIT: Discuss access for hemodialysis  HPI: Monica [name] Buckalew is a 83 y.o. female seen today to discuss access for hemodialysis. Had left brachiocephalic fistula placed by Dr. Edilia Boickson in the past. This had very short-term use and became thrombosed. Recently had left IJ catheter placed by Dr. Claudie Fishermanhin. She is seen today for discussion of permanent access. She is right-handed. Has never had right arm access.  Past Medical History:  Diagnosis Date  . Arthritis   . Atrial flutter (HCC)   . CKD (chronic kidney disease) stage 4, GFR 15-29 ml/min (HCC)   . Dementia   . ETOHism (HCC)    Quit in the 1980s.  . Gastric ulcer 12/2013.   Prepyloric ulcer. On follow-up EGD in 02/2014 this was 90% healed  . GERD (gastroesophageal reflux disease)   . Hemorrhoids   . Hypertension   . Nephrolithiasis   . Pancreatic cyst 09/2010.   At uncinate process.  Serial CT imaging favors benign process    Family History  Problem Relation Age of Onset  . Alcohol abuse Mother     SOCIAL HISTORY: Social History  Substance Use Topics  . Smoking status: Former Smoker    Quit date: 02/14/1986  . Smokeless tobacco: Never Used  . Alcohol use No     Comment: former drinker    Allergies  Allergen Reactions  . Aspirin Other (See Comments)    "makes my stomach hurt"  . Codeine Other (See Comments)    "i get irritated"    Current Outpatient Prescriptions  Medication Sig Dispense Refill  . acetaminophen (TYLENOL) 500 MG tablet Take 1,000 mg by mouth every 6 (six) hours as needed for mild pain.    Marland Kitchen. ALPRAZolam (XANAX) 0.5 MG tablet Take 1 tablet (0.5 mg total) by mouth 2 (two) times daily. 60 tablet 0  . Amino Acids-Protein Hydrolys (FEEDING SUPPLEMENT, PRO-STAT SUGAR FREE 64,) LIQD Take 30 mLs by mouth 2 (two) times daily.    . diclofenac sodium  (VOLTAREN) 1 % GEL Apply 1 application topically 2 (two) times daily. To knees for arthritis pain    . ketotifen (ALAWAY) 0.025 % ophthalmic solution Place 2 drops into both eyes as needed (for dry/itchy eyes).    . metoprolol tartrate (LOPRESSOR) 25 MG tablet Take 12.5 mg by mouth 2 (two) times daily.     . nitroGLYCERIN (NITROSTAT) 0.4 MG SL tablet Place 0.4 mg under the tongue every 5 (five) minutes as needed for chest pain.    Marland Kitchen. NYAMYC powder Apply 1 application topically at bedtime.    . pantoprazole (PROTONIX) 40 MG tablet Take 1 tablet (40 mg total) by mouth daily. 30 tablet 3  . Sodium Phosphates (RA SALINE ENEMA RE) Place 1 each rectally as needed (for constipation).    . Tetrahydrozoline HCl (VISINE OP) Place 2 drops into both eyes daily as needed (dry eyes).    . bisacodyl (DULCOLAX) 10 MG suppository Place 10 mg rectally as needed for moderate constipation.    Marland Kitchen. HYDROcodone-acetaminophen (NORCO/VICODIN) 5-325 MG tablet Take 1 tablet by mouth every 12 (twelve) hours as needed for moderate pain. DNE 3gm of APAP/24hrs (Patient not taking: Reported on 01/20/2017) 60 tablet 0  . Magnesium Hydroxide (MILK OF MAGNESIA PO) Take 30 mLs by mouth as needed (for constipation).     No current facility-administered medications for this visit.  REVIEW OF SYSTEMS:  [X]  denotes positive finding, [ ]  denotes negative finding Cardiac  Comments:  Chest pain or chest pressure: x   Shortness of breath upon exertion: x   Short of breath when lying flat:    Irregular heart rhythm:        Vascular    Pain in calf, thigh, or hip brought on by ambulation: x   Pain in feet at night that wakes you up from your sleep:     Blood clot in your veins:    Leg swelling:  x         PHYSICAL EXAM: Vitals:   01/20/17 1558  BP: 120/70  Pulse: 69  Resp: 16  Temp: 98.3 F (36.8 C)  TempSrc: Oral  SpO2: 99%  Weight: 130 lb (59 kg)  Height: 5\' 1"  (1.549 m)    GENERAL: The patient is a well-nourished  female, in no acute distress. The vital signs are documented above. CARDIOVASCULAR: 2+ radial pulses bilaterally. Left brachial pulse with a 2+ pulse. Thrombosed left brachiocephalic fistula PULMONARY: There is good air exchange  MUSCULOSKELETAL: There are no major deformities or cyanosis. NEUROLOGIC: No focal weakness or paresthesias are detected. SKIN: There are no ulcers or rashes noted. PSYCHIATRIC: The patient has a normal affect.  DATA:  Right arm vascular lab studies reveal small cephalic and basilic vein.  MEDICAL ISSUES: Your best option would be a left upper arm AV graft. She has dialysis on Tuesday Thursday and Friday. She has no family. She has a neighbor who helps with transportation and is available on Thursdays only. Will arrange left upper arm AV graft placement on a Thursday. She will have overnight observation since she has no family who can stay with her.    Larina Earthlyodd F. Dakai Braithwaite, MD FACS [facility] Office Tel [phone] Pager [phone]

## 2017-01-27 ENCOUNTER — Encounter (HOSPITAL_COMMUNITY): Payer: Self-pay | Admitting: *Deleted

## 2017-01-27 NOTE — Progress Notes (Signed)
Spoke with pt for pre-op call. Pt very hard to understand. She denies any cardiac history and is not diabetic.

## 2017-01-29 ENCOUNTER — Encounter (HOSPITAL_COMMUNITY): Payer: Self-pay | Admitting: Urology

## 2017-01-29 ENCOUNTER — Ambulatory Visit (HOSPITAL_COMMUNITY): Payer: Medicare Other | Admitting: Anesthesiology

## 2017-01-29 ENCOUNTER — Encounter (HOSPITAL_COMMUNITY)
Admission: RE | Disposition: A | Discharge status: Home / Self Care | Payer: Self-pay | Source: Ambulatory Visit | Attending: Vascular Surgery

## 2017-01-29 ENCOUNTER — Inpatient Hospital Stay (HOSPITAL_COMMUNITY)
Admission: RE | Admit: 2017-01-29 | Discharge: 2017-01-30 | DRG: 673 | Disposition: A | Discharge status: Home / Self Care | Payer: Medicare Other | Source: Ambulatory Visit | Attending: Vascular Surgery | Admitting: Vascular Surgery

## 2017-01-29 DIAGNOSIS — Z811 Family history of alcohol abuse and dependence: Secondary | ICD-10-CM | POA: Diagnosis not present

## 2017-01-29 DIAGNOSIS — Z992 Dependence on renal dialysis: Secondary | ICD-10-CM | POA: Diagnosis not present

## 2017-01-29 DIAGNOSIS — Z886 Allergy status to analgesic agent status: Secondary | ICD-10-CM | POA: Diagnosis not present

## 2017-01-29 DIAGNOSIS — I120 Hypertensive chronic kidney disease with stage 5 chronic kidney disease or end stage renal disease: Principal | ICD-10-CM | POA: Diagnosis present

## 2017-01-29 DIAGNOSIS — N185 Chronic kidney disease, stage 5: Secondary | ICD-10-CM

## 2017-01-29 DIAGNOSIS — I4892 Unspecified atrial flutter: Secondary | ICD-10-CM | POA: Diagnosis not present

## 2017-01-29 DIAGNOSIS — M1990 Unspecified osteoarthritis, unspecified site: Secondary | ICD-10-CM | POA: Diagnosis present

## 2017-01-29 DIAGNOSIS — Z87891 Personal history of nicotine dependence: Secondary | ICD-10-CM

## 2017-01-29 DIAGNOSIS — Z885 Allergy status to narcotic agent status: Secondary | ICD-10-CM | POA: Diagnosis not present

## 2017-01-29 DIAGNOSIS — N186 End stage renal disease: Secondary | ICD-10-CM | POA: Diagnosis not present

## 2017-01-29 DIAGNOSIS — F419 Anxiety disorder, unspecified: Secondary | ICD-10-CM | POA: Diagnosis not present

## 2017-01-29 DIAGNOSIS — F0390 Unspecified dementia without behavioral disturbance: Secondary | ICD-10-CM | POA: Diagnosis not present

## 2017-01-29 DIAGNOSIS — K219 Gastro-esophageal reflux disease without esophagitis: Secondary | ICD-10-CM | POA: Diagnosis present

## 2017-01-29 DIAGNOSIS — F329 Major depressive disorder, single episode, unspecified: Secondary | ICD-10-CM | POA: Diagnosis present

## 2017-01-29 HISTORY — DX: Dependence on renal dialysis: Z99.2

## 2017-01-29 HISTORY — DX: Anxiety disorder, unspecified: F41.9

## 2017-01-29 HISTORY — DX: Constipation, unspecified: K59.00

## 2017-01-29 HISTORY — DX: Personal history of peptic ulcer disease: Z87.11

## 2017-01-29 HISTORY — DX: Headache: R51

## 2017-01-29 HISTORY — DX: Major depressive disorder, single episode, unspecified: F32.9

## 2017-01-29 HISTORY — DX: Dorsalgia, unspecified: M54.9

## 2017-01-29 HISTORY — DX: Personal history of urinary calculi: Z87.442

## 2017-01-29 HISTORY — DX: Headache, unspecified: R51.9

## 2017-01-29 HISTORY — DX: Other chronic pain: G89.29

## 2017-01-29 HISTORY — PX: ARTERIOVENOUS GRAFT PLACEMENT: SUR1029

## 2017-01-29 HISTORY — DX: End stage renal disease: N18.6

## 2017-01-29 HISTORY — DX: Depression, unspecified: F32.A

## 2017-01-29 HISTORY — DX: Personal history of other diseases of the digestive system: Z87.19

## 2017-01-29 HISTORY — PX: AV FISTULA PLACEMENT: SHX1204

## 2017-01-29 LAB — CBC
HCT: 34.5 % — ABNORMAL LOW (ref 36.0–46.0)
Hemoglobin: 10.9 g/dL — ABNORMAL LOW (ref 12.0–15.0)
MCH: 25.8 pg — ABNORMAL LOW (ref 26.0–34.0)
MCHC: 31.6 g/dL (ref 30.0–36.0)
MCV: 81.8 fL (ref 78.0–100.0)
Platelets: 78 10*3/uL — ABNORMAL LOW (ref 150–400)
RBC: 4.22 MIL/uL (ref 3.87–5.11)
RDW: 15.4 % (ref 11.5–15.5)
WBC: 7.6 10*3/uL (ref 4.0–10.5)

## 2017-01-29 LAB — POCT I-STAT 4, (NA,K, GLUC, HGB,HCT)
Glucose, Bld: 91 mg/dL (ref 65–99)
HEMATOCRIT: 40 % (ref 36.0–46.0)
HEMOGLOBIN: 13.6 g/dL (ref 12.0–15.0)
POTASSIUM: 4.2 mmol/L (ref 3.5–5.1)
SODIUM: 139 mmol/L (ref 135–145)

## 2017-01-29 LAB — MRSA PCR SCREENING: MRSA BY PCR: POSITIVE — AB

## 2017-01-29 LAB — CREATININE, SERUM
CREATININE: 6.1 mg/dL — AB (ref 0.44–1.00)
GFR, EST AFRICAN AMERICAN: 7 mL/min — AB (ref >60)
GFR, EST NON AFRICAN AMERICAN: 6 mL/min — AB (ref >60)

## 2017-01-29 SURGERY — INSERTION OF ARTERIOVENOUS (AV) GORE-TEX GRAFT ARM
Anesthesia: Monitor Anesthesia Care | Site: Arm Upper | Laterality: Left

## 2017-01-29 MED ORDER — ONDANSETRON HCL 4 MG/2ML IJ SOLN: 4.0000 mg | Freq: Four times a day (QID) | INTRAMUSCULAR | Status: DC | PRN

## 2017-01-29 MED ORDER — PROTAMINE SULFATE 10 MG/ML IV SOLN
INTRAVENOUS | Status: AC
  Filled 2017-01-29: qty 5

## 2017-01-29 MED ORDER — CHLORHEXIDINE GLUCONATE CLOTH 2 % EX PADS: 6.0000 | MEDICATED_PAD | Freq: Once | CUTANEOUS | Status: DC

## 2017-01-29 MED ORDER — ALUM & MAG HYDROXIDE-SIMETH 200-200-20 MG/5ML PO SUSP: 15.0000 mL | ORAL | Status: DC | PRN

## 2017-01-29 MED ORDER — MUPIROCIN 2 % EX OINT
1.0000 "application " | TOPICAL_OINTMENT | Freq: Two times a day (BID) | CUTANEOUS | Status: DC
  Administered 2017-01-29 – 2017-01-30 (×2): 1 via NASAL
  Filled 2017-01-29 (×2): qty 22

## 2017-01-29 MED ORDER — SODIUM CHLORIDE 0.9% FLUSH
3.0000 mL | Freq: Two times a day (BID) | INTRAVENOUS | Status: DC
  Administered 2017-01-29 – 2017-01-30 (×2): 3 mL via INTRAVENOUS

## 2017-01-29 MED ORDER — LABETALOL HCL 5 MG/ML IV SOLN
10.0000 mg | INTRAVENOUS | Status: DC | PRN
  Filled 2017-01-29: qty 4

## 2017-01-29 MED ORDER — METOPROLOL TARTRATE 12.5 MG HALF TABLET
12.5000 mg | ORAL_TABLET | Freq: Once | ORAL | Status: AC
  Administered 2017-01-29: 12.5 mg via ORAL

## 2017-01-29 MED ORDER — ALPRAZOLAM 0.5 MG PO TABS
0.5000 mg | ORAL_TABLET | Freq: Two times a day (BID) | ORAL | Status: DC
  Administered 2017-01-29 – 2017-01-30 (×2): 0.5 mg via ORAL
  Filled 2017-01-29 (×2): qty 1

## 2017-01-29 MED ORDER — SODIUM CHLORIDE 0.9 % IV SOLN
INTRAVENOUS | Status: DC
  Administered 2017-01-29: 10:00:00 via INTRAVENOUS

## 2017-01-29 MED ORDER — LIDOCAINE-EPINEPHRINE (PF) 1 %-1:200000 IJ SOLN
INTRAMUSCULAR | Status: DC | PRN
  Administered 2017-01-29: 30 mL

## 2017-01-29 MED ORDER — MIDAZOLAM HCL 2 MG/2ML IJ SOLN
INTRAMUSCULAR | Status: AC
  Filled 2017-01-29: qty 2

## 2017-01-29 MED ORDER — DEXTROSE 5 % IV SOLN
1.5000 g | Freq: Two times a day (BID) | INTRAVENOUS | Status: DC
  Filled 2017-01-29: qty 1.5

## 2017-01-29 MED ORDER — 0.9 % SODIUM CHLORIDE (POUR BTL) OPTIME
TOPICAL | Status: DC | PRN
  Administered 2017-01-29: 1000 mL

## 2017-01-29 MED ORDER — NITROGLYCERIN 0.4 MG SL SUBL: 0.4000 mg | SUBLINGUAL_TABLET | SUBLINGUAL | Status: DC | PRN

## 2017-01-29 MED ORDER — HEPARIN SODIUM (PORCINE) 1000 UNIT/ML IJ SOLN
INTRAMUSCULAR | Status: DC | PRN
  Administered 2017-01-29: 5000 [IU] via INTRAVENOUS

## 2017-01-29 MED ORDER — SODIUM CHLORIDE 0.9 % IV SOLN: 250.0000 mL | INTRAVENOUS | Status: DC | PRN

## 2017-01-29 MED ORDER — POTASSIUM CHLORIDE CRYS ER 20 MEQ PO TBCR: 20.0000 meq | EXTENDED_RELEASE_TABLET | Freq: Once | ORAL | Status: DC

## 2017-01-29 MED ORDER — PROPOFOL 10 MG/ML IV BOLUS
INTRAVENOUS | Status: AC
  Filled 2017-01-29: qty 20

## 2017-01-29 MED ORDER — METOPROLOL TARTRATE 12.5 MG HALF TABLET
ORAL_TABLET | ORAL | Status: AC
  Filled 2017-01-29: qty 1

## 2017-01-29 MED ORDER — SODIUM CHLORIDE 0.9 % IV SOLN
INTRAVENOUS | Status: DC | PRN
  Administered 2017-01-29: 13:00:00

## 2017-01-29 MED ORDER — HYDROCODONE-ACETAMINOPHEN 5-325 MG PO TABS
ORAL_TABLET | ORAL | Status: AC
  Filled 2017-01-29: qty 2

## 2017-01-29 MED ORDER — HYDRALAZINE HCL 20 MG/ML IJ SOLN
5.0000 mg | INTRAMUSCULAR | Status: DC | PRN
  Filled 2017-01-29: qty 0.25

## 2017-01-29 MED ORDER — METOPROLOL TARTRATE 12.5 MG HALF TABLET
12.5000 mg | ORAL_TABLET | Freq: Two times a day (BID) | ORAL | Status: DC
  Administered 2017-01-29 – 2017-01-30 (×2): 12.5 mg via ORAL
  Filled 2017-01-29 (×2): qty 1

## 2017-01-29 MED ORDER — DEXTROSE 5 % IV SOLN
1.5000 g | INTRAVENOUS | Status: DC
  Filled 2017-01-29: qty 1.5

## 2017-01-29 MED ORDER — ENSURE ENLIVE PO LIQD: 237.0000 mL | Freq: Two times a day (BID) | ORAL | Status: DC

## 2017-01-29 MED ORDER — PROTAMINE SULFATE 10 MG/ML IV SOLN
INTRAVENOUS | Status: DC | PRN
  Administered 2017-01-29: 30 mg via INTRAVENOUS

## 2017-01-29 MED ORDER — PROPOFOL 10 MG/ML IV BOLUS
INTRAVENOUS | Status: DC | PRN
  Administered 2017-01-29: 20 mg via INTRAVENOUS

## 2017-01-29 MED ORDER — DEXTROSE 5 % IV SOLN
1.5000 g | INTRAVENOUS | Status: AC
  Administered 2017-01-29: 1.5 g via INTRAVENOUS
  Filled 2017-01-29: qty 1.5

## 2017-01-29 MED ORDER — LIDOCAINE 2% (20 MG/ML) 5 ML SYRINGE
INTRAMUSCULAR | Status: DC | PRN
  Administered 2017-01-29 (×2): 40 mg via INTRAVENOUS

## 2017-01-29 MED ORDER — PHENOL 1.4 % MT LIQD: 1.0000 | OROMUCOSAL | Status: DC | PRN

## 2017-01-29 MED ORDER — SODIUM CHLORIDE 0.9% FLUSH: 3.0000 mL | INTRAVENOUS | Status: DC | PRN

## 2017-01-29 MED ORDER — FENTANYL CITRATE (PF) 100 MCG/2ML IJ SOLN
INTRAMUSCULAR | Status: DC | PRN
  Administered 2017-01-29: 50 ug via INTRAVENOUS

## 2017-01-29 MED ORDER — EPHEDRINE SULFATE-NACL 50-0.9 MG/10ML-% IV SOSY
PREFILLED_SYRINGE | INTRAVENOUS | Status: DC | PRN
  Administered 2017-01-29: 10 mg via INTRAVENOUS

## 2017-01-29 MED ORDER — FENTANYL CITRATE (PF) 250 MCG/5ML IJ SOLN
INTRAMUSCULAR | Status: AC
  Filled 2017-01-29: qty 5

## 2017-01-29 MED ORDER — LIDOCAINE HCL (PF) 1 % IJ SOLN
INTRAMUSCULAR | Status: AC
  Filled 2017-01-29: qty 30

## 2017-01-29 MED ORDER — CHLORHEXIDINE GLUCONATE CLOTH 2 % EX PADS: 6.0000 | MEDICATED_PAD | Freq: Every day | CUTANEOUS | Status: DC

## 2017-01-29 MED ORDER — PANTOPRAZOLE SODIUM 40 MG PO TBEC
40.0000 mg | DELAYED_RELEASE_TABLET | Freq: Every day | ORAL | Status: DC
  Administered 2017-01-29 – 2017-01-30 (×2): 40 mg via ORAL
  Filled 2017-01-29 (×2): qty 1

## 2017-01-29 MED ORDER — LIDOCAINE-EPINEPHRINE (PF) 1 %-1:200000 IJ SOLN
INTRAMUSCULAR | Status: AC
  Filled 2017-01-29: qty 30

## 2017-01-29 MED ORDER — MORPHINE SULFATE (PF) 2 MG/ML IV SOLN
2.0000 mg | INTRAVENOUS | Status: DC | PRN
  Administered 2017-01-30: 2 mg via INTRAVENOUS
  Filled 2017-01-29: qty 1

## 2017-01-29 MED ORDER — GUAIFENESIN-DM 100-10 MG/5ML PO SYRP: 15.0000 mL | ORAL_SOLUTION | ORAL | Status: DC | PRN

## 2017-01-29 MED ORDER — METOPROLOL TARTRATE 5 MG/5ML IV SOLN
2.0000 mg | INTRAVENOUS | Status: DC | PRN
Start: 2017-01-29 — End: 2017-01-30
  Filled 2017-01-29: qty 5

## 2017-01-29 MED ORDER — DOCUSATE SODIUM 100 MG PO CAPS
100.0000 mg | ORAL_CAPSULE | Freq: Two times a day (BID) | ORAL | Status: DC
  Administered 2017-01-29 – 2017-01-30 (×2): 100 mg via ORAL
  Filled 2017-01-29 (×2): qty 1

## 2017-01-29 MED ORDER — ACETAMINOPHEN 500 MG PO TABS: 1000.0000 mg | ORAL_TABLET | Freq: Four times a day (QID) | ORAL | Status: DC | PRN

## 2017-01-29 MED ORDER — HYDROCODONE-ACETAMINOPHEN 5-325 MG PO TABS: 1.0000 | ORAL_TABLET | Freq: Two times a day (BID) | ORAL | 0 refills | Status: DC | PRN

## 2017-01-29 MED ORDER — CEFUROXIME SODIUM 1.5 G IJ SOLR
INTRAMUSCULAR | Status: AC
  Filled 2017-01-29: qty 1.5

## 2017-01-29 MED ORDER — PROPOFOL 500 MG/50ML IV EMUL
INTRAVENOUS | Status: DC | PRN
  Administered 2017-01-29: 75 ug/kg/min via INTRAVENOUS

## 2017-01-29 MED ORDER — HYDROCODONE-ACETAMINOPHEN 5-325 MG PO TABS
1.0000 | ORAL_TABLET | ORAL | Status: DC | PRN
  Administered 2017-01-29 – 2017-01-30 (×5): 2 via ORAL
  Filled 2017-01-29 (×5): qty 2

## 2017-01-29 MED ORDER — EPHEDRINE 5 MG/ML INJ
INTRAVENOUS | Status: AC
  Filled 2017-01-29: qty 10

## 2017-01-29 MED ORDER — ENOXAPARIN SODIUM 30 MG/0.3ML ~~LOC~~ SOLN: 30.0000 mg | SUBCUTANEOUS | Status: DC

## 2017-01-29 SURGICAL SUPPLY — 37 items
ADH SKN CLS APL DERMABOND .7 (GAUZE/BANDAGES/DRESSINGS) ×2
ARMBAND PINK RESTRICT EXTREMIT (MISCELLANEOUS) ×6 IMPLANT
CANISTER SUCTION 2500CC (MISCELLANEOUS) ×3 IMPLANT
CANNULA VESSEL 3MM 2 BLNT TIP (CANNULA) ×3 IMPLANT
CLIP TI MEDIUM 6 (CLIP) ×3 IMPLANT
CLIP TI WIDE RED SMALL 6 (CLIP) ×3 IMPLANT
DECANTER SPIKE VIAL GLASS SM (MISCELLANEOUS) ×3 IMPLANT
DERMABOND ADVANCED (GAUZE/BANDAGES/DRESSINGS) ×4
DERMABOND ADVANCED .7 DNX12 (GAUZE/BANDAGES/DRESSINGS) ×1 IMPLANT
ELECT REM PT RETURN 9FT ADLT (ELECTROSURGICAL) ×3
ELECTRODE REM PT RTRN 9FT ADLT (ELECTROSURGICAL) ×1 IMPLANT
GLOVE BIO SURGEON STRL SZ 6.5 (GLOVE) ×4 IMPLANT
GLOVE BIO SURGEON STRL SZ7.5 (GLOVE) ×3 IMPLANT
GLOVE BIO SURGEONS STRL SZ 6.5 (GLOVE) ×4
GLOVE BIOGEL PI IND STRL 6.5 (GLOVE) IMPLANT
GLOVE BIOGEL PI IND STRL 7.0 (GLOVE) IMPLANT
GLOVE BIOGEL PI IND STRL 8 (GLOVE) ×1 IMPLANT
GLOVE BIOGEL PI INDICATOR 6.5 (GLOVE) ×8
GLOVE BIOGEL PI INDICATOR 7.0 (GLOVE) ×4
GLOVE BIOGEL PI INDICATOR 8 (GLOVE) ×2
GLOVE SURG SS PI 6.5 STRL IVOR (GLOVE) ×2 IMPLANT
GOWN STRL NON-REIN LRG LVL3 (GOWN DISPOSABLE) ×2 IMPLANT
GOWN STRL REUS W/ TWL LRG LVL3 (GOWN DISPOSABLE) ×3 IMPLANT
GOWN STRL REUS W/TWL LRG LVL3 (GOWN DISPOSABLE) ×9
GRAFT GORETEX STRT 4-7X45 (Vascular Products) ×2 IMPLANT
KIT BASIN OR (CUSTOM PROCEDURE TRAY) ×3 IMPLANT
KIT ROOM TURNOVER OR (KITS) ×3 IMPLANT
NS IRRIG 1000ML POUR BTL (IV SOLUTION) ×3 IMPLANT
PACK CV ACCESS (CUSTOM PROCEDURE TRAY) ×3 IMPLANT
PAD ARMBOARD 7.5X6 YLW CONV (MISCELLANEOUS) ×6 IMPLANT
SPONGE SURGIFOAM ABS GEL 100 (HEMOSTASIS) IMPLANT
SUT PROLENE 6 0 BV (SUTURE) ×8 IMPLANT
SUT VIC AB 3-0 SH 27 (SUTURE) ×9
SUT VIC AB 3-0 SH 27X BRD (SUTURE) ×2 IMPLANT
SUT VICRYL 4-0 PS2 18IN ABS (SUTURE) ×8 IMPLANT
UNDERPAD 30X30 (UNDERPADS AND DIAPERS) ×3 IMPLANT
WATER STERILE IRR 1000ML POUR (IV SOLUTION) ×3 IMPLANT

## 2017-01-29 NOTE — Progress Notes (Addendum)
Patient arrived to 2W room 33 after having left upper arm AV graft placement.  Telemetry monitor applied and CCMD notified.  Patient oriented to unit and room to include call light and phone. Upon arrival, patient complained of her left hand feeling numb.  Patient's left hand was noted to be cool.  Radial pulse in the left hand was noted to be very weak.  PA was paged.  Will continue to monitor.

## 2017-01-29 NOTE — Interval H&P Note (Signed)
History and Physical Interval Note:  01/29/2017 11:56 AM  Monica [name]  has presented today for surgery, with the diagnosis of End stage renal disease N18.6  The various methods of treatment have been discussed with the patient and family. After consideration of risks, benefits and other options for treatment, the patient has consented to  Procedure(s): INSERTION OF ARTERIOVENOUS (AV) GORE-TEX GRAFT ARM (Left) as a surgical intervention .  The patient's history has been reviewed, patient examined, no change in status, stable for surgery.  I have reviewed the patient's chart and labs.  Questions were answered to the patient's satisfaction.     Waverly Ferrariickson, Christopher

## 2017-01-29 NOTE — Op Note (Signed)
    NAME: Monica [name]    MRN: [id number] DOB: [birth year]    DATE OF OPERATION: 01/29/2017  PREOP DIAGNOSIS: End-stage renal disease  POSTOP DIAGNOSIS: Same  PROCEDURE: Left forearm AV graft  SURGEON: Di Kindlehristopher S. Edilia Boickson, MD, FACS  ASSIST: Emilio AspenEleanor Aguilar RNFA and Karsten RoKim Trinh, [facility]  ANESTHESIA: Local with sedation   EBL: Minimal  INDICATIONS: Monica [name] is a 83 y.o. female  who presents for new access.    FINDINGS: 4 mm brachial vein.   TECHNIQUE: The patient was taken to the operative room and sedated by anesthesia. The left upper extremity was prepped and draped in usual sterile fashion. After the skin was anesthetized with 1 sent lidocaine, a longitudinal incision was made over the anastomosis of the cephalic vein to the brachial artery. This fistula was chronically occluded. I dissected free the artery proximal and distal to the anastomosis. I also dissected free the adjacent vein which was reasonable in size. A counterincision was made in the forearm and a 4-7 mm PTFE graft was tunneled in a loop fashion in the forearm with the arterial aspect of the graft along the radial aspect of the forearm. The patient was then heparinized. The brachial artery was clamped proximally and distally in the old anastomosis was taken down. A short segment of the 4 mm end of the graft was excised, the grafts were spatulated and sewn end to side to the artery using continuous 6-0 Prolene suture. The graft was then pulled the appropriate length for anastomosis to the brachial vein. The vein was ligated distally and spatulated proximal. At 4 mm dilator. The vein was sewn into into the graft using 2 continuous 6-0 Prolene sutures. At the completion was a good thrill in the fistula. There was a radial signal with Doppler. Hemostasis was obtained in the wounds and the heparin was partially reversed with protamine. The wounds were closed with a pair of 3-0 Vicryl and the skin closed with 4-0 Vicryl. Dermabond was  applied. The patient tolerated the procedure well and was transferred to the recovery room in stable condition. All needle and sponge counts were correct.  Waverly Ferrarihristopher Blakleigh Straw, MD, FACS [facility]  DATE OF DICTATION:   01/29/2017

## 2017-01-29 NOTE — Progress Notes (Signed)
Pt does not have a ride home today, states she has a ride home tomorrow.   Pt and friend also state they do not know what medicines she takes, but she has not taken any medicine today. Samantha with pharmacy contacting patient pharmacy to verify meds.

## 2017-01-29 NOTE — Transfer of Care (Signed)
Immediate Anesthesia Transfer of Care Note  Patient: Monica [name]  Procedure(s) Performed: Procedure(s): INSERTION OF ARTERIOVENOUS (AV) GORE-TEX GRAFT  STRETCHED IN LEFT FORE ARM (Left)  Patient Location: PACU  Anesthesia Type:MAC  Level of Consciousness: awake, oriented and patient cooperative  Airway & Oxygen Therapy: Patient Spontanous Breathing and Patient connected to nasal cannula oxygen  Post-op Assessment: Report given to RN, Post -op Vital signs reviewed and stable and Patient moving all extremities  Post vital signs: Reviewed and stable  Last Vitals:  Vitals:   01/29/17 0911  BP: (!) 122/52  Pulse: 77  Resp: 16  Temp: 36.8 C    Last Pain:  Vitals:   01/29/17 0911  TempSrc: Oral      Patients Stated Pain Goal: 2 (11/91/47 8295)  Complications: No apparent anesthesia complications

## 2017-01-29 NOTE — H&P (View-Only) (Signed)
[facility]  Patient name: Monica [name] MRN: [id number] DOB: 1932/04/23 Sex: female  REASON FOR VISIT: Discuss access for hemodialysis  HPI: Monica [name] is a 83 y.o. female seen today to discuss access for hemodialysis. Had left brachiocephalic fistula placed by Dr. Edilia Boickson in the past. This had very short-term use and became thrombosed. Recently had left IJ catheter placed by Dr. Claudie Fishermanhin. She is seen today for discussion of permanent access. She is right-handed. Has never had right arm access.  Past Medical History:  Diagnosis Date  . Arthritis   . Atrial flutter (HCC)   . CKD (chronic kidney disease) stage 4, GFR 15-29 ml/min (HCC)   . Dementia   . ETOHism (HCC)    Quit in the 1980s.  . Gastric ulcer 12/2013.   Prepyloric ulcer. On follow-up EGD in 02/2014 this was 90% healed  . GERD (gastroesophageal reflux disease)   . Hemorrhoids   . Hypertension   . Nephrolithiasis   . Pancreatic cyst 09/2010.   At uncinate process.  Serial CT imaging favors benign process    Family History  Problem Relation Age of Onset  . Alcohol abuse Mother     SOCIAL HISTORY: Social History  Substance Use Topics  . Smoking status: Former Smoker    Quit date: 02/14/1986  . Smokeless tobacco: Never Used  . Alcohol use No     Comment: former drinker    Allergies  Allergen Reactions  . Aspirin Other (See Comments)    "makes my stomach hurt"  . Codeine Other (See Comments)    "i get irritated"    Current Outpatient Prescriptions  Medication Sig Dispense Refill  . acetaminophen (TYLENOL) 500 MG tablet Take 1,000 mg by mouth every 6 (six) hours as needed for mild pain.    Marland Kitchen. ALPRAZolam (XANAX) 0.5 MG tablet Take 1 tablet (0.5 mg total) by mouth 2 (two) times daily. 60 tablet 0  . Amino Acids-Protein Hydrolys (FEEDING SUPPLEMENT, PRO-STAT SUGAR FREE 64,) LIQD Take 30 mLs by mouth 2 (two) times daily.    . diclofenac sodium  (VOLTAREN) 1 % GEL Apply 1 application topically 2 (two) times daily. To knees for arthritis pain    . ketotifen (ALAWAY) 0.025 % ophthalmic solution Place 2 drops into both eyes as needed (for dry/itchy eyes).    . metoprolol tartrate (LOPRESSOR) 25 MG tablet Take 12.5 mg by mouth 2 (two) times daily.     . nitroGLYCERIN (NITROSTAT) 0.4 MG SL tablet Place 0.4 mg under the tongue every 5 (five) minutes as needed for chest pain.    Marland Kitchen. NYAMYC powder Apply 1 application topically at bedtime.    . pantoprazole (PROTONIX) 40 MG tablet Take 1 tablet (40 mg total) by mouth daily. 30 tablet 3  . Sodium Phosphates (RA SALINE ENEMA RE) Place 1 each rectally as needed (for constipation).    . Tetrahydrozoline HCl (VISINE OP) Place 2 drops into both eyes daily as needed (dry eyes).    . bisacodyl (DULCOLAX) 10 MG suppository Place 10 mg rectally as needed for moderate constipation.    Marland Kitchen. HYDROcodone-acetaminophen (NORCO/VICODIN) 5-325 MG tablet Take 1 tablet by mouth every 12 (twelve) hours as needed for moderate pain. DNE 3gm of APAP/24hrs (Patient not taking: Reported on 01/20/2017) 60 tablet 0  . Magnesium Hydroxide (MILK OF MAGNESIA PO) Take 30 mLs by mouth as needed (for constipation).     No current facility-administered medications for this visit.  REVIEW OF SYSTEMS:  [X]  denotes positive finding, [ ]  denotes negative finding Cardiac  Comments:  Chest pain or chest pressure: x   Shortness of breath upon exertion: x   Short of breath when lying flat:    Irregular heart rhythm:        Vascular    Pain in calf, thigh, or hip brought on by ambulation: x   Pain in feet at night that wakes you up from your sleep:     Blood clot in your veins:    Leg swelling:  x         PHYSICAL EXAM: Vitals:   01/20/17 1558  BP: 120/70  Pulse: 69  Resp: 16  Temp: 98.3 F (36.8 C)  TempSrc: Oral  SpO2: 99%  Weight: 130 lb (59 kg)  Height: 5\' 1"  (1.549 m)    GENERAL: The patient is a well-nourished  female, in no acute distress. The vital signs are documented above. CARDIOVASCULAR: 2+ radial pulses bilaterally. Left brachial pulse with a 2+ pulse. Thrombosed left brachiocephalic fistula PULMONARY: There is good air exchange  MUSCULOSKELETAL: There are no major deformities or cyanosis. NEUROLOGIC: No focal weakness or paresthesias are detected. SKIN: There are no ulcers or rashes noted. PSYCHIATRIC: The patient has a normal affect.  DATA:  Right arm vascular lab studies reveal small cephalic and basilic vein.  MEDICAL ISSUES: Your best option would be a left upper arm AV graft. She has dialysis on Tuesday Thursday and Friday. She has no family. She has a neighbor who helps with transportation and is available on Thursdays only. Will arrange left upper arm AV graft placement on a Thursday. She will have overnight observation since she has no family who can stay with her.    Larina Earthlyodd F. Katiejo Gilroy, MD FACS [facility] Office Tel [phone] Pager [phone]

## 2017-01-29 NOTE — Progress Notes (Signed)
   Per report, patient was having significant numbness of her left hand earlier that has resolved on my exam.  Radial signal is weak as is palmar arch and significantly improve with graft compression but she is currently asymptomatic.  Will make npo at midnight until a.m evaluation.   Zyaire Mccleod C. Randie Heinzain, MD [facility] Office: [phone] Pager: [phone]

## 2017-01-29 NOTE — Progress Notes (Signed)
PHARMACY NOTE:  ANTIMICROBIAL RENAL DOSAGE ADJUSTMENT  Current antimicrobial regimen includes a mismatch between antimicrobial dosage and estimated renal function.  As per policy approved by the Pharmacy & Therapeutics and Medical Executive Committees, the antimicrobial dosage will be adjusted accordingly.  Current antimicrobial dosage:  1.5 g q12h   Indication: Surgical Prophylaxis   Renal Function: ESRD  Estimated Creatinine Clearance: 6 mL/min (by C-G formula based on SCr of 6.1 mg/dL (H)).     Antimicrobial dosage has been changed to: 1.5 g q24h  Additional comments: Consider defining length of therapy.    Thank you for allowing pharmacy to be a part of this patient's care.  Dwain SarnaMeredith Maleki Hippe, Student-PharmD 01/29/2017 8:26 PM

## 2017-01-29 NOTE — Anesthesia Procedure Notes (Signed)
Procedure Name: MAC Performed by: Claybon Jabs R Pre-anesthesia Checklist: Patient identified, Emergency Drugs available, Suction available, Patient being monitored and Timeout performed Oxygen Delivery Method: Nasal cannula Placement Confirmation: positive ETCO2 Dental Injury: Teeth and Oropharynx as per pre-operative assessment

## 2017-01-29 NOTE — Anesthesia Preprocedure Evaluation (Addendum)
Anesthesia Evaluation  Patient identified by MRN, date of birth, ID band Patient awake    Reviewed: Allergy & Precautions, NPO status , Patient's Chart, lab work & pertinent test results  Airway Mallampati: I  TM Distance: >3 FB Neck ROM: Full    Dental  (+) Teeth Intact   Pulmonary former smoker,    breath sounds clear to auscultation       Cardiovascular hypertension,  Rhythm:Regular Rate:Normal     Neuro/Psych    GI/Hepatic PUD, GERD  ,  Endo/Other    Renal/GU ESRF and DialysisRenal disease     Musculoskeletal  (+) Arthritis ,   Abdominal   Peds  Hematology  (+) anemia ,   Anesthesia Other Findings   Reproductive/Obstetrics                            Anesthesia Physical Anesthesia Plan  ASA: III  Anesthesia Plan: MAC   Post-op Pain Management:    Induction: Intravenous  Airway Management Planned: Natural Airway and Simple Face Mask  Additional Equipment:   Intra-op Plan:   Post-operative Plan:   Informed Consent: I have reviewed the patients History and Physical, chart, labs and discussed the procedure including the risks, benefits and alternatives for the proposed anesthesia with the patient or authorized representative who has indicated his/her understanding and acceptance.     Plan Discussed with:   Anesthesia Plan Comments:         Anesthesia Quick Evaluation

## 2017-01-29 NOTE — Anesthesia Postprocedure Evaluation (Addendum)
Anesthesia Post Note  Patient: Bosie Helper  Procedure(s) Performed: Procedure(s) (LRB): INSERTION OF ARTERIOVENOUS (AV) GORE-TEX GRAFT  STRETCHED IN LEFT FORE ARM (Left)  Patient location during evaluation: PACU Anesthesia Type: MAC Level of consciousness: awake and alert Pain management: pain level controlled Vital Signs Assessment: post-procedure vital signs reviewed and stable Respiratory status: spontaneous breathing, nonlabored ventilation, respiratory function stable and patient connected to nasal cannula oxygen Cardiovascular status: stable and blood pressure returned to baseline Anesthetic complications: no       Last Vitals:  Vitals:   01/29/17 1500 01/29/17 1604  BP: (!) 117/56 132/63  Pulse:  66  Resp:  20  Temp:  36.4 C    Last Pain:  Vitals:   01/29/17 1607  TempSrc:   PainSc: 0-No pain                 Tariah Transue,JAMES TERRILL

## 2017-01-30 ENCOUNTER — Encounter: Payer: Self-pay | Admitting: Vascular Surgery

## 2017-01-30 ENCOUNTER — Encounter (HOSPITAL_COMMUNITY): Payer: Self-pay | Admitting: Vascular Surgery

## 2017-01-30 ENCOUNTER — Ambulatory Visit (INDEPENDENT_AMBULATORY_CARE_PROVIDER_SITE_OTHER): Payer: Self-pay | Admitting: Physician Assistant

## 2017-01-30 VITALS — BP 146/79 | Temp 99.4°F | Resp 14 | Ht 60.0 in

## 2017-01-30 DIAGNOSIS — N186 End stage renal disease: Secondary | ICD-10-CM

## 2017-01-30 MED ORDER — HYDROCODONE-ACETAMINOPHEN 5-325 MG PO TABS: 1.0000 | ORAL_TABLET | Freq: Two times a day (BID) | ORAL | 0 refills | Status: DC | PRN

## 2017-01-30 NOTE — Progress Notes (Signed)
  [facility] Progress Note  Subjective  - POD #1  Complaining of pain left forearm at graft site. Denies any left hand pain.   Objective Vitals:   01/29/17 2041 01/30/17 0444  BP: (!) 122/52 118/60  Pulse: 81 62  Resp: 18 18  Temp: 98.6 F (37 C) 98 F (36.7 C)    Intake/Output Summary (Last 24 hours) at 01/30/17 0659 Last data filed at 01/29/17 1700  Gross per 24 hour  Intake              640 ml  Output               25 ml  Net              615 ml   Left hand is warm with sensation intact and good grip strength. Audible left radial doppler signal and palmar arch signal. Left forearm loop graft with audible bruit. Forearm with ecchymosis and tenderness to palpation. Incision intact.  Assessment/Planning: 83 y.o. female is s/p: left forearm loop graft 1 Day Post-Op   No steal symptoms. Left hand is well perfused. Having pain with graft site.  Ok for d/c this am.  She will attend outpatient dialysis today at noon.   Raymond GurneyKimberly A Brinnley [name] 01/30/2017 6:59 AM --  Laboratory CBC    Component Value Date/Time   WBC 7.6 01/29/2017 1612   HGB 10.9 (L) 01/29/2017 1612   HCT 34.5 (L) 01/29/2017 1612   PLT 78 (L) 01/29/2017 1612    BMET    Component Value Date/Time   NA 139 01/29/2017 0943   K 4.2 01/29/2017 0943   CL 94 (L) 10/28/2016 0822   CO2 26 10/28/2016 0822   GLUCOSE 91 01/29/2017 0943   BUN 27 (H) 10/28/2016 0822   CREATININE 6.10 (H) 01/29/2017 1612   CALCIUM 8.7 (L) 10/28/2016 0822   GFRNONAA 6 (L) 01/29/2017 1612   GFRAA 7 (L) 01/29/2017 1612    COAG Lab Results  Component Value Date   INR 1.10 03/20/2015   INR 0.93 12/28/2013   INR 1.05 08/21/2011   No results found for: PTT  Antibiotics Anti-infectives    Start     Dose/Rate Route Frequency Ordered Stop   01/30/17 1200  cefUROXime (ZINACEF) 1.5 g in dextrose 5 % 50 mL IVPB     1.5 g 100 mL/hr over 30 Minutes Intravenous Every 24 hours 01/29/17 2029     01/29/17 2300   cefUROXime (ZINACEF) 1.5 g in dextrose 5 % 50 mL IVPB  Status:  Discontinued     1.5 g 100 mL/hr over 30 Minutes Intravenous Every 12 hours 01/29/17 1553 01/29/17 2029   01/29/17 0913  cefUROXime (ZINACEF) 1.5 g in dextrose 5 % 50 mL IVPB     1.5 g 100 mL/hr over 30 Minutes Intravenous 30 min pre-op 01/29/17 0913 01/29/17 1204   01/29/17 0907  dextrose 5 % with cefUROXime (ZINACEF) ADS Med    Comments:  Monica [name], Monica [name]: cabinet override      01/29/17 0907 01/29/17 1204       Monica BergerKimberly Virl Coble, PA-C [facility] Office: [phone] Pager: [phone] 01/30/2017 6:59 AM

## 2017-01-30 NOTE — Progress Notes (Signed)
POST OPERATIVE OFFICE NOTE    CC:  F/u for surgery  HPI:  This is a 83 y.o. female who is s/p left forearm loop graft insertion yesterday, 01/29/17 by Dr. Edilia Boickson.  On the night of surgery, there was concern because she had complaints of her left hand feeling numb.  She was seen by Dr. Randie Heinzain and at that time, her numbness had resolved and she was on her phone with her left hand.  She She had a weak left radial and palmar arch signal that did improve with graft compression. Given that she was asymptomatic, no surgical intervention was planned. She was made NPO for morning evaluation.   This morning (POD 1), she denied any hand pain but did have complaints in her left forearm.  Her graft was patent. She had doppler flow in her left radial artery and palmar arch. She had some ecchymosis at the graft tunnel site. Her incisions were clean and intact. She was discharged home on POD 1 in good condition.   The dialysis center called this afternoon and asked that we see the pt as they felt she was "rejecting the graft".  The pt states she has had some numbness in her hand, but only when her hand is cold.  Her biggest complaint is pain in the forearm and in her shoulder.    Allergies  Allergen Reactions  . Aspirin Other (See Comments)    "makes my stomach hurt"  . Codeine Other (See Comments)    "i get irritated"    Current Outpatient Prescriptions  Medication Sig Dispense Refill  . acetaminophen (TYLENOL) 500 MG tablet Take 1,000 mg by mouth every 6 (six) hours as needed for mild pain.    Marland Kitchen. ALPRAZolam (XANAX) 0.5 MG tablet Take 1 tablet (0.5 mg total) by mouth 2 (two) times daily. 60 tablet 0  . diclofenac sodium (VOLTAREN) 1 % GEL Apply 1 application topically 2 (two) times daily. To knees for arthritis pain    . HYDROcodone-acetaminophen (NORCO/VICODIN) 5-325 MG tablet Take 1 tablet by mouth every 12 (twelve) hours as needed for moderate pain. DNE 3gm of APAP/24hrs 30 tablet 0  . metoprolol  tartrate (LOPRESSOR) 25 MG tablet Take 12.5 mg by mouth 2 (two) times daily.     . nitroGLYCERIN (NITROSTAT) 0.4 MG SL tablet Place 0.4 mg under the tongue every 5 (five) minutes as needed for chest pain.    . pantoprazole (PROTONIX) 40 MG tablet Take 1 tablet (40 mg total) by mouth daily. 30 tablet 3  . Tetrahydrozoline HCl (VISINE OP) Place 2 drops into both eyes daily as needed (dry eyes).     No current facility-administered medications for this visit.      ROS:  See HPI  Physical Exam:  Vitals:   01/30/17 1617  BP: (!) 146/79  Resp: 14  Temp: 99.4 F (37.4 C)   Vitals:   01/30/17 1617  Height: 5' (1.524 m)    Incision:  Clean and dry; there is some erythema/bruising around the tunneled area for the graft.  Extremities:  Monophasic radial signal that does improve with graft compression.  Weak palmar arch signal, which also improved with graft compression.  Motor and sensation are in tact.    Assessment/Plan:  This is a 83 y.o. female who is s/p: Placement of right forearm graft  -pt seen and examined by Dr. Randie Heinzain.  Her biggest complaint this afternoon is pain in the forearm and shoulder.  Pain in the forearm is not  unexpected as she has a tunneled graft.  Her shoulder pain is most likely positional from the OR.  There is some erythema/bruising over the tunneled graft, which is not unexpected as well. -of bigger concern is the numbness in her hand.   She states that her numbness improves when her hand is warm.  Her motor and sensation are in tact.  Her graft is patent.  Her radial signal and palmar arch signal improve with graft compression.  There is some concern for steal.  Discussed in detail with the pt and family member that there is a chance that this graft may need to be removed if this does not improve.  Dr. Randie Heinz does not feel like it needs to come out at this time.  We will have her follow up with Dr. Edilia Bo on Wednesday morning to re-evaluate.  If she continues to have  issues with numbness, she and her family member know to contact the MD on call or go to the ER.  Both expressed understanding.   Doreatha Massed, PA-C [facility] [phone]  Clinic MD:  Pt seen and examined with Dr. Randie Heinz

## 2017-01-30 NOTE — Discharge Summary (Signed)
[facility] Discharge Summary  Monica [name] Oct 25, 1932 83 y.o. female  [id number]  Admission Date: 01/29/2017  Discharge Date: 01/30/2014  Physician: Waverly Ferrari, MD  Admission Diagnosis: End stage renal disease N18.6  HPI:   This is a 83 y.o. female who presented to the [facility] office to discuss access for hemodialysis. Had left brachiocephalic fistula placed by Dr. Edilia Bo in the past. This had very short-term use and became thrombosed. Recently had left IJ catheter placed by Dr. Imogene Burn.  She is right-handed. Has never had right arm access.  Hospital Course:  The patient was admitted to the hospital and taken to the operating room on 01/29/2017 and underwent: right forearm AV graft The patient tolerated the procedure well and was transported to the PACU in stable condition. The patient was admitted for observation given lack of transportation post-op. Later on day of surgery, the patient complained of left hand feeling numb. When Dr. Randie Heinz came to evaluate patient, her numbness had resolved. The patient was on her phone with her left hand.   She had a weak left radial and palmar arch signal that did improve with graft compression. Given that she was asymptomatic, no surgical intervention was planned. She was made NPO for morning evaluation.   On POD 1, the patient denied any left hand pain. She complained of pain in her left forearm. Her graft was patent. She had doppler flow in her left radial artery and palmar arch. She had some ecchymosis at the graft tunnel site. Her incisions were clean and intact. She was discharged home on POD 1 in good condition.   CBC    Component Value Date/Time   WBC 7.6 01/29/2017 1612   RBC 4.22 01/29/2017 1612   HGB 10.9 (L) 01/29/2017 1612   HCT 34.5 (L) 01/29/2017 1612   PLT 78 (L) 01/29/2017 1612   MCV 81.8 01/29/2017 1612   MCH 25.8 (L) 01/29/2017 1612   MCHC 31.6 01/29/2017 1612   RDW 15.4 01/29/2017 1612   LYMPHSABS 1.1  08/13/2016 1459   MONOABS 0.6 08/13/2016 1459   EOSABS 0.0 08/13/2016 1459   BASOSABS 0.0 08/13/2016 1459    BMET    Component Value Date/Time   NA 139 01/29/2017 0943   K 4.2 01/29/2017 0943   CL 94 (L) 10/28/2016 0822   CO2 26 10/28/2016 0822   GLUCOSE 91 01/29/2017 0943   BUN 27 (H) 10/28/2016 0822   CREATININE 6.10 (H) 01/29/2017 1612   CALCIUM 8.7 (L) 10/28/2016 0822   GFRNONAA 6 (L) 01/29/2017 1612   GFRAA 7 (L) 01/29/2017 1612     Discharge Instructions:   The patient is discharged to home with extensive instructions on wound care and progressive ambulation.  They are instructed not to drive or perform any heavy lifting until returning to see the physician in his office.    Discharge Diagnosis:  End stage renal disease N18.6  Secondary Diagnosis: Patient Active Problem List   Diagnosis Date Noted  . ESRD on dialysis (HCC) 01/29/2017  . Left sided chest pain 10/28/2016  . Protein-calorie malnutrition, severe 10/25/2016  . Uremia 10/23/2016  . Dementia 10/23/2016  . Anemia, chronic renal failure, stage 5 (HCC) 10/23/2016  . Unintentional weight loss 10/23/2016  . Pancreatic cyst   . Hematuria   . Encounter for nasogastric (NG) tube placement   . SBO (small bowel obstruction)   . Abscess of buttock 12/29/2013  . HTN (hypertension) 12/29/2013  . PAF (paroxysmal atrial fibrillation) (HCC) 12/29/2013  .  Melena 12/29/2013  . CKD (chronic kidney disease) stage 5, GFR less than 15 ml/min (HCC) 12/29/2013  . Escherichia coli urinary tract infection 12/29/2013  . Gastric ulcer with hemorrhage 12/29/2013  . Upper GI bleed 12/28/2013   Past Medical History:  Diagnosis Date  . Anxiety   . Arthritis    "all over" (01/29/2017)  . Atrial flutter (HCC)   . Chronic back pain    "all my back" (01/29/2017)  . Constipation   . Dementia   . Depression   . ESRD (end stage renal disease) on dialysis [facility](HCC)    "MWF; [address]" (01/29/2017)  . ETOHism (HCC)    Quit in  the 1980s.  . Gastric ulcer 12/2013.   Prepyloric ulcer. On follow-up EGD in 02/2014 this was 90% healed  . GERD (gastroesophageal reflux disease)   . Headache    as a young woman  . Hemorrhoids   . History of kidney stones   . History of stomach ulcers   . Hypertension   . Pancreatic cyst 09/2010.   At uncinate process.  Serial CT imaging favors benign process     Allergies as of 01/30/2017      Reactions   Aspirin Other (See Comments)   "makes my stomach hurt"   Codeine Other (See Comments)   "i get irritated"      Medication List    TAKE these medications   acetaminophen 500 MG tablet Commonly known as:  TYLENOL Take 1,000 mg by mouth every 6 (six) hours as needed for mild pain.   ALPRAZolam 0.5 MG tablet Commonly known as:  XANAX Take 1 tablet (0.5 mg total) by mouth 2 (two) times daily.   diclofenac sodium 1 % Gel Commonly known as:  VOLTAREN Apply 1 application topically 2 (two) times daily. To knees for arthritis pain   HYDROcodone-acetaminophen 5-325 MG tablet Commonly known as:  NORCO/VICODIN Take 1 tablet by mouth every 12 (twelve) hours as needed for moderate pain. DNE 3gm of APAP/24hrs   metoprolol tartrate 25 MG tablet Commonly known as:  LOPRESSOR Take 12.5 mg by mouth 2 (two) times daily.   nitroGLYCERIN 0.4 MG SL tablet Commonly known as:  NITROSTAT Place 0.4 mg under the tongue every 5 (five) minutes as needed for chest pain.   pantoprazole 40 MG tablet Commonly known as:  PROTONIX Take 1 tablet (40 mg total) by mouth daily.   VISINE OP Place 2 drops into both eyes daily as needed (dry eyes).       Percocet #30 No Refill  Disposition: Home  Patient's condition: is Good  Follow up: 1. Dr. Edilia Boickson as needed.    Maris BergerKimberly Genisis Sonnier, PA-C [facility] [phone] 01/30/2017  12:13 PM

## 2017-01-31 ENCOUNTER — Telehealth: Payer: Self-pay | Admitting: Vascular Surgery

## 2017-01-31 ENCOUNTER — Ambulatory Visit (HOSPITAL_COMMUNITY): Payer: Medicare Other | Admitting: Certified Registered"

## 2017-01-31 ENCOUNTER — Observation Stay (HOSPITAL_COMMUNITY)
Admission: RE | Admit: 2017-01-31 | Discharge: 2017-02-01 | Disposition: A | Discharge status: Home / Self Care | Payer: Medicare Other | Source: Ambulatory Visit | Attending: Vascular Surgery | Admitting: Vascular Surgery

## 2017-01-31 ENCOUNTER — Encounter (HOSPITAL_COMMUNITY)
Admission: RE | Disposition: A | Discharge status: Home / Self Care | Payer: Self-pay | Source: Ambulatory Visit | Attending: Vascular Surgery

## 2017-01-31 ENCOUNTER — Encounter (HOSPITAL_COMMUNITY): Payer: Self-pay | Admitting: Certified Registered"

## 2017-01-31 DIAGNOSIS — Z79899 Other long term (current) drug therapy: Secondary | ICD-10-CM | POA: Insufficient documentation

## 2017-01-31 DIAGNOSIS — F0390 Unspecified dementia without behavioral disturbance: Secondary | ICD-10-CM | POA: Diagnosis not present

## 2017-01-31 DIAGNOSIS — N186 End stage renal disease: Secondary | ICD-10-CM | POA: Diagnosis not present

## 2017-01-31 DIAGNOSIS — Y832 Surgical operation with anastomosis, bypass or graft as the cause of abnormal reaction of the patient, or of later complication, without mention of misadventure at the time of the procedure: Secondary | ICD-10-CM | POA: Insufficient documentation

## 2017-01-31 DIAGNOSIS — T82898A Other specified complication of vascular prosthetic devices, implants and grafts, initial encounter: Principal | ICD-10-CM | POA: Diagnosis present

## 2017-01-31 DIAGNOSIS — I120 Hypertensive chronic kidney disease with stage 5 chronic kidney disease or end stage renal disease: Secondary | ICD-10-CM | POA: Insufficient documentation

## 2017-01-31 DIAGNOSIS — F419 Anxiety disorder, unspecified: Secondary | ICD-10-CM | POA: Insufficient documentation

## 2017-01-31 DIAGNOSIS — T827XXA Infection and inflammatory reaction due to other cardiac and vascular devices, implants and grafts, initial encounter: Secondary | ICD-10-CM | POA: Diagnosis present

## 2017-01-31 DIAGNOSIS — Z992 Dependence on renal dialysis: Secondary | ICD-10-CM | POA: Insufficient documentation

## 2017-01-31 DIAGNOSIS — K219 Gastro-esophageal reflux disease without esophagitis: Secondary | ICD-10-CM | POA: Diagnosis not present

## 2017-01-31 DIAGNOSIS — Z87891 Personal history of nicotine dependence: Secondary | ICD-10-CM | POA: Insufficient documentation

## 2017-01-31 HISTORY — PX: AVGG REMOVAL: SHX5153

## 2017-01-31 LAB — COMPREHENSIVE METABOLIC PANEL
ALBUMIN: 3 g/dL — AB (ref 3.5–5.0)
ALT: 8 U/L — ABNORMAL LOW (ref 14–54)
ANION GAP: 16 — AB (ref 5–15)
AST: 15 U/L (ref 15–41)
Alkaline Phosphatase: 52 U/L (ref 38–126)
BUN: 27 mg/dL — ABNORMAL HIGH (ref 6–20)
CHLORIDE: 98 mmol/L — AB (ref 101–111)
CO2: 23 mmol/L (ref 22–32)
Calcium: 8.5 mg/dL — ABNORMAL LOW (ref 8.9–10.3)
Creatinine, Ser: 6.89 mg/dL — ABNORMAL HIGH (ref 0.44–1.00)
GFR calc Af Amer: 6 mL/min — ABNORMAL LOW (ref >60)
GFR calc non Af Amer: 5 mL/min — ABNORMAL LOW (ref >60)
GLUCOSE: 124 mg/dL — AB (ref 65–99)
POTASSIUM: 4 mmol/L (ref 3.5–5.1)
SODIUM: 137 mmol/L (ref 135–145)
Total Bilirubin: 0.6 mg/dL (ref 0.3–1.2)
Total Protein: 6.9 g/dL (ref 6.5–8.1)

## 2017-01-31 LAB — CBC
HEMATOCRIT: 34.2 % — AB (ref 36.0–46.0)
HEMOGLOBIN: 11 g/dL — AB (ref 12.0–15.0)
MCH: 25.8 pg — AB (ref 26.0–34.0)
MCHC: 32.2 g/dL (ref 30.0–36.0)
MCV: 80.1 fL (ref 78.0–100.0)
Platelets: 53 10*3/uL — ABNORMAL LOW (ref 150–400)
RBC: 4.27 MIL/uL (ref 3.87–5.11)
RDW: 15.1 % (ref 11.5–15.5)
WBC: 11.3 10*3/uL — ABNORMAL HIGH (ref 4.0–10.5)

## 2017-01-31 LAB — POCT I-STAT 4, (NA,K, GLUC, HGB,HCT)
Glucose, Bld: 117 mg/dL — ABNORMAL HIGH (ref 65–99)
HEMATOCRIT: 43 % (ref 36.0–46.0)
HEMOGLOBIN: 14.6 g/dL (ref 12.0–15.0)
POTASSIUM: 4.2 mmol/L (ref 3.5–5.1)
SODIUM: 132 mmol/L — AB (ref 135–145)

## 2017-01-31 SURGERY — REMOVAL OF ARTERIOVENOUS GORETEX GRAFT (AVGG)
Anesthesia: General | Laterality: Left

## 2017-01-31 MED ORDER — SENNA 8.6 MG PO TABS
1.0000 | ORAL_TABLET | Freq: Two times a day (BID) | ORAL | Status: DC
  Administered 2017-01-31 – 2017-02-01 (×3): 8.6 mg via ORAL
  Filled 2017-01-31 (×3): qty 1

## 2017-01-31 MED ORDER — ONDANSETRON HCL 4 MG/2ML IJ SOLN: 4.0000 mg | Freq: Four times a day (QID) | INTRAMUSCULAR | Status: DC | PRN

## 2017-01-31 MED ORDER — HEPARIN SODIUM (PORCINE) 5000 UNIT/ML IJ SOLN
5000.0000 [IU] | Freq: Three times a day (TID) | INTRAMUSCULAR | Status: DC
  Administered 2017-01-31 – 2017-02-01 (×2): 5000 [IU] via SUBCUTANEOUS
  Filled 2017-01-31 (×2): qty 1

## 2017-01-31 MED ORDER — ALPRAZOLAM 0.5 MG PO TABS
0.5000 mg | ORAL_TABLET | Freq: Two times a day (BID) | ORAL | Status: DC
  Administered 2017-01-31 – 2017-02-01 (×3): 0.5 mg via ORAL
  Filled 2017-01-31 (×3): qty 1

## 2017-01-31 MED ORDER — FAMOTIDINE 20 MG PO TABS
20.0000 mg | ORAL_TABLET | Freq: Two times a day (BID) | ORAL | Status: DC
  Administered 2017-01-31 – 2017-02-01 (×3): 20 mg via ORAL
  Filled 2017-01-31 (×4): qty 1

## 2017-01-31 MED ORDER — ONDANSETRON HCL 4 MG/2ML IJ SOLN
INTRAMUSCULAR | Status: AC
  Filled 2017-01-31: qty 2

## 2017-01-31 MED ORDER — PROTAMINE SULFATE 10 MG/ML IV SOLN
INTRAVENOUS | Status: AC
  Filled 2017-01-31: qty 5

## 2017-01-31 MED ORDER — PROPOFOL 10 MG/ML IV BOLUS
INTRAVENOUS | Status: AC
  Filled 2017-01-31: qty 20

## 2017-01-31 MED ORDER — METOPROLOL TARTRATE 12.5 MG HALF TABLET
12.5000 mg | ORAL_TABLET | Freq: Two times a day (BID) | ORAL | Status: DC
  Administered 2017-01-31 – 2017-02-01 (×2): 12.5 mg via ORAL
  Filled 2017-01-31 (×2): qty 1

## 2017-01-31 MED ORDER — NITROGLYCERIN 0.4 MG SL SUBL: 0.4000 mg | SUBLINGUAL_TABLET | SUBLINGUAL | Status: DC | PRN

## 2017-01-31 MED ORDER — EPHEDRINE SULFATE 50 MG/ML IJ SOLN
INTRAMUSCULAR | Status: DC | PRN
  Administered 2017-01-31: 10 mg via INTRAVENOUS

## 2017-01-31 MED ORDER — HEPARIN SODIUM (PORCINE) 1000 UNIT/ML IJ SOLN
INTRAMUSCULAR | Status: DC | PRN
  Administered 2017-01-31: 6500 [IU] via INTRAVENOUS

## 2017-01-31 MED ORDER — GUAIFENESIN-DM 100-10 MG/5ML PO SYRP: 15.0000 mL | ORAL_SOLUTION | ORAL | Status: DC | PRN

## 2017-01-31 MED ORDER — ACETAMINOPHEN 325 MG RE SUPP: 325.0000 mg | RECTAL | Status: DC | PRN

## 2017-01-31 MED ORDER — PHENYLEPHRINE HCL 10 MG/ML IJ SOLN
INTRAVENOUS | Status: DC | PRN
  Administered 2017-01-31: 25 ug/min via INTRAVENOUS

## 2017-01-31 MED ORDER — PROTAMINE SULFATE 10 MG/ML IV SOLN
INTRAVENOUS | Status: DC | PRN
  Administered 2017-01-31: 10 mg via INTRAVENOUS
  Administered 2017-01-31: 20 mg via INTRAVENOUS
  Administered 2017-01-31: 10 mg via INTRAVENOUS

## 2017-01-31 MED ORDER — FENTANYL CITRATE (PF) 100 MCG/2ML IJ SOLN: 25.0000 ug | INTRAMUSCULAR | Status: DC | PRN

## 2017-01-31 MED ORDER — ALUM & MAG HYDROXIDE-SIMETH 200-200-20 MG/5ML PO SUSP: 15.0000 mL | ORAL | Status: DC | PRN

## 2017-01-31 MED ORDER — ACETAMINOPHEN 325 MG PO TABS
325.0000 mg | ORAL_TABLET | ORAL | Status: DC | PRN
  Administered 2017-02-01 (×2): 650 mg via ORAL
  Filled 2017-01-31 (×2): qty 2

## 2017-01-31 MED ORDER — CEFAZOLIN SODIUM 1 G IJ SOLR
INTRAMUSCULAR | Status: AC
  Filled 2017-01-31: qty 20

## 2017-01-31 MED ORDER — CEFAZOLIN SODIUM 1 G IJ SOLR
INTRAMUSCULAR | Status: DC | PRN
  Administered 2017-01-31: 2 g via INTRAMUSCULAR

## 2017-01-31 MED ORDER — LIDOCAINE HCL (CARDIAC) 20 MG/ML IV SOLN
INTRAVENOUS | Status: DC | PRN
  Administered 2017-01-31: 40 mg via INTRAVENOUS

## 2017-01-31 MED ORDER — MORPHINE SULFATE (PF) 2 MG/ML IV SOLN: 2.0000 mg | INTRAVENOUS | Status: DC | PRN

## 2017-01-31 MED ORDER — LIDOCAINE 2% (20 MG/ML) 5 ML SYRINGE
INTRAMUSCULAR | Status: AC
  Filled 2017-01-31: qty 5

## 2017-01-31 MED ORDER — HYDRALAZINE HCL 20 MG/ML IJ SOLN: 5.0000 mg | INTRAMUSCULAR | Status: DC | PRN

## 2017-01-31 MED ORDER — SODIUM CHLORIDE 0.9 % IV SOLN
INTRAVENOUS | Status: DC | PRN
  Administered 2017-01-31: 12:00:00

## 2017-01-31 MED ORDER — FLEET ENEMA 7-19 GM/118ML RE ENEM: 1.0000 | ENEMA | Freq: Once | RECTAL | Status: DC | PRN

## 2017-01-31 MED ORDER — METOPROLOL TARTRATE 12.5 MG HALF TABLET
ORAL_TABLET | ORAL | Status: AC
  Administered 2017-01-31: 12.5 mg via ORAL
  Filled 2017-01-31: qty 1

## 2017-01-31 MED ORDER — FENTANYL CITRATE (PF) 100 MCG/2ML IJ SOLN
INTRAMUSCULAR | Status: DC | PRN
  Administered 2017-01-31 (×2): 50 ug via INTRAVENOUS

## 2017-01-31 MED ORDER — METOPROLOL TARTRATE 5 MG/5ML IV SOLN: 2.0000 mg | INTRAVENOUS | Status: DC | PRN

## 2017-01-31 MED ORDER — SODIUM CHLORIDE 0.9 % IV SOLN
INTRAVENOUS | Status: DC
  Administered 2017-01-31: 12:00:00 via INTRAVENOUS

## 2017-01-31 MED ORDER — PHENOL 1.4 % MT LIQD: 1.0000 | OROMUCOSAL | Status: DC | PRN

## 2017-01-31 MED ORDER — LABETALOL HCL 5 MG/ML IV SOLN: 10.0000 mg | INTRAVENOUS | Status: DC | PRN

## 2017-01-31 MED ORDER — ROCURONIUM BROMIDE 50 MG/5ML IV SOSY
PREFILLED_SYRINGE | INTRAVENOUS | Status: AC
  Filled 2017-01-31: qty 5

## 2017-01-31 MED ORDER — HYDROCODONE-ACETAMINOPHEN 5-325 MG PO TABS
1.0000 | ORAL_TABLET | Freq: Two times a day (BID) | ORAL | Status: DC | PRN
  Administered 2017-01-31 – 2017-02-01 (×2): 1 via ORAL
  Filled 2017-01-31 (×3): qty 1

## 2017-01-31 MED ORDER — BISACODYL 10 MG RE SUPP: 10.0000 mg | Freq: Every day | RECTAL | Status: DC | PRN

## 2017-01-31 MED ORDER — SENNOSIDES-DOCUSATE SODIUM 8.6-50 MG PO TABS: 1.0000 | ORAL_TABLET | Freq: Every evening | ORAL | Status: DC | PRN

## 2017-01-31 MED ORDER — ONDANSETRON HCL 4 MG/2ML IJ SOLN
INTRAMUSCULAR | Status: DC | PRN
  Administered 2017-01-31: 4 mg via INTRAVENOUS

## 2017-01-31 MED ORDER — HEMOSTATIC AGENTS (NO CHARGE) OPTIME
TOPICAL | Status: DC | PRN
  Administered 2017-01-31: 1 via TOPICAL

## 2017-01-31 MED ORDER — TETRAHYDROZOLINE HCL 0.05 % OP SOLN
2.0000 [drp] | Freq: Every day | OPHTHALMIC | Status: DC | PRN
  Filled 2017-01-31: qty 15

## 2017-01-31 MED ORDER — PROPOFOL 10 MG/ML IV BOLUS
INTRAVENOUS | Status: DC | PRN
  Administered 2017-01-31: 20 mg via INTRAVENOUS
  Administered 2017-01-31: 30 mg via INTRAVENOUS
  Administered 2017-01-31: 110 mg via INTRAVENOUS

## 2017-01-31 MED ORDER — EPHEDRINE 5 MG/ML INJ
INTRAVENOUS | Status: AC
  Filled 2017-01-31: qty 10

## 2017-01-31 MED ORDER — METOPROLOL TARTRATE 12.5 MG HALF TABLET
12.5000 mg | ORAL_TABLET | Freq: Once | ORAL | Status: AC
  Administered 2017-01-31: 12.5 mg via ORAL

## 2017-01-31 MED ORDER — SUCCINYLCHOLINE CHLORIDE 20 MG/ML IJ SOLN
INTRAMUSCULAR | Status: DC | PRN
  Administered 2017-01-31: 60 mg via INTRAVENOUS

## 2017-01-31 MED ORDER — FENTANYL CITRATE (PF) 100 MCG/2ML IJ SOLN
INTRAMUSCULAR | Status: AC
  Filled 2017-01-31: qty 2

## 2017-01-31 MED ORDER — ACETAMINOPHEN 500 MG PO TABS: 1000.0000 mg | ORAL_TABLET | Freq: Four times a day (QID) | ORAL | Status: DC | PRN

## 2017-01-31 SURGICAL SUPPLY — 42 items
ADH SKN CLS APL DERMABOND .7 (GAUZE/BANDAGES/DRESSINGS) ×1
AGENT HMST SPONGE THK3/8 (HEMOSTASIS) ×1
BANDAGE ACE 4X5 VEL STRL LF (GAUZE/BANDAGES/DRESSINGS) ×2 IMPLANT
BNDG CMPR 9X4 STRL LF SNTH (GAUZE/BANDAGES/DRESSINGS) ×1
BNDG ESMARK 4X9 LF (GAUZE/BANDAGES/DRESSINGS) ×2 IMPLANT
BNDG GAUZE ELAST 4 BULKY (GAUZE/BANDAGES/DRESSINGS) ×2 IMPLANT
CANISTER SUCT 3000ML PPV (MISCELLANEOUS) ×3 IMPLANT
CLIP TI MEDIUM 6 (CLIP) ×3 IMPLANT
CLIP TI WIDE RED SMALL 6 (CLIP) ×3 IMPLANT
CUFF TOURNIQUET SINGLE 18IN (TOURNIQUET CUFF) ×2 IMPLANT
DECANTER SPIKE VIAL GLASS SM (MISCELLANEOUS) IMPLANT
DERMABOND ADVANCED (GAUZE/BANDAGES/DRESSINGS) ×2
DERMABOND ADVANCED .7 DNX12 (GAUZE/BANDAGES/DRESSINGS) ×1 IMPLANT
ELECT REM PT RETURN 9FT ADLT (ELECTROSURGICAL) ×3
ELECTRODE REM PT RTRN 9FT ADLT (ELECTROSURGICAL) ×1 IMPLANT
GAUZE SPONGE 4X4 12PLY STRL (GAUZE/BANDAGES/DRESSINGS) ×2 IMPLANT
GLOVE BIO SURGEON STRL SZ 6.5 (GLOVE) ×1 IMPLANT
GLOVE BIO SURGEON STRL SZ7 (GLOVE) ×3 IMPLANT
GLOVE BIO SURGEONS STRL SZ 6.5 (GLOVE) ×1
GLOVE BIOGEL PI IND STRL 6.5 (GLOVE) IMPLANT
GLOVE BIOGEL PI IND STRL 7.5 (GLOVE) ×1 IMPLANT
GLOVE BIOGEL PI INDICATOR 6.5 (GLOVE) ×2
GLOVE BIOGEL PI INDICATOR 7.5 (GLOVE) ×4
GOWN STRL REUS W/ TWL LRG LVL3 (GOWN DISPOSABLE) ×3 IMPLANT
GOWN STRL REUS W/TWL LRG LVL3 (GOWN DISPOSABLE) ×9
HEMOSTAT SPONGE AVITENE ULTRA (HEMOSTASIS) ×2 IMPLANT
KIT BASIN OR (CUSTOM PROCEDURE TRAY) ×3 IMPLANT
KIT ROOM TURNOVER OR (KITS) ×3 IMPLANT
NDL HYPO 25GX1X1/2 BEV (NEEDLE) ×1 IMPLANT
NEEDLE HYPO 25GX1X1/2 BEV (NEEDLE) ×3 IMPLANT
NS IRRIG 1000ML POUR BTL (IV SOLUTION) ×3 IMPLANT
PACK CV ACCESS (CUSTOM PROCEDURE TRAY) ×3 IMPLANT
PAD ARMBOARD 7.5X6 YLW CONV (MISCELLANEOUS) ×6 IMPLANT
SPONGE INTESTINAL PEANUT (DISPOSABLE) ×2 IMPLANT
SPONGE SURGIFOAM ABS GEL 100 (HEMOSTASIS) IMPLANT
STAPLER VISISTAT 35W (STAPLE) ×2 IMPLANT
SUT MNCRL AB 4-0 PS2 18 (SUTURE) IMPLANT
SUT PROLENE 6 0 BV (SUTURE) ×9 IMPLANT
SUT VIC AB 3-0 SH 27 (SUTURE) ×3
SUT VIC AB 3-0 SH 27X BRD (SUTURE) ×1 IMPLANT
UNDERPAD 30X30 (UNDERPADS AND DIAPERS) ×3 IMPLANT
WATER STERILE IRR 1000ML POUR (IV SOLUTION) ×3 IMPLANT

## 2017-01-31 NOTE — H&P (View-Only) (Signed)
[facility]  Patient name: Monica LimesMary S [name] MRN: [id number] DOB: 1932/04/23 Sex: female  REASON FOR VISIT: Discuss access for hemodialysis  HPI: Monica [name] is a 83 y.o. female seen today to discuss access for hemodialysis. Had left brachiocephalic fistula placed by Dr. Edilia Boickson in the past. This had very short-term use and became thrombosed. Recently had left IJ catheter placed by Dr. Claudie Fishermanhin. She is seen today for discussion of permanent access. She is right-handed. Has never had right arm access.  Past Medical History:  Diagnosis Date  . Arthritis   . Atrial flutter (HCC)   . CKD (chronic kidney disease) stage 4, GFR 15-29 ml/min (HCC)   . Dementia   . ETOHism (HCC)    Quit in the 1980s.  . Gastric ulcer 12/2013.   Prepyloric ulcer. On follow-up EGD in 02/2014 this was 90% healed  . GERD (gastroesophageal reflux disease)   . Hemorrhoids   . Hypertension   . Nephrolithiasis   . Pancreatic cyst 09/2010.   At uncinate process.  Serial CT imaging favors benign process    Family History  Problem Relation Age of Onset  . Alcohol abuse Mother     SOCIAL HISTORY: Social History  Substance Use Topics  . Smoking status: Former Smoker    Quit date: 02/14/1986  . Smokeless tobacco: Never Used  . Alcohol use No     Comment: former drinker    Allergies  Allergen Reactions  . Aspirin Other (See Comments)    "makes my stomach hurt"  . Codeine Other (See Comments)    "i get irritated"    Current Outpatient Prescriptions  Medication Sig Dispense Refill  . acetaminophen (TYLENOL) 500 MG tablet Take 1,000 mg by mouth every 6 (six) hours as needed for mild pain.    Marland Kitchen. ALPRAZolam (XANAX) 0.5 MG tablet Take 1 tablet (0.5 mg total) by mouth 2 (two) times daily. 60 tablet 0  . Amino Acids-Protein Hydrolys (FEEDING SUPPLEMENT, PRO-STAT SUGAR FREE 64,) LIQD Take 30 mLs by mouth 2 (two) times daily.    . diclofenac sodium  (VOLTAREN) 1 % GEL Apply 1 application topically 2 (two) times daily. To knees for arthritis pain    . ketotifen (ALAWAY) 0.025 % ophthalmic solution Place 2 drops into both eyes as needed (for dry/itchy eyes).    . metoprolol tartrate (LOPRESSOR) 25 MG tablet Take 12.5 mg by mouth 2 (two) times daily.     . nitroGLYCERIN (NITROSTAT) 0.4 MG SL tablet Place 0.4 mg under the tongue every 5 (five) minutes as needed for chest pain.    Marland Kitchen. NYAMYC powder Apply 1 application topically at bedtime.    . pantoprazole (PROTONIX) 40 MG tablet Take 1 tablet (40 mg total) by mouth daily. 30 tablet 3  . Sodium Phosphates (RA SALINE ENEMA RE) Place 1 each rectally as needed (for constipation).    . Tetrahydrozoline HCl (VISINE OP) Place 2 drops into both eyes daily as needed (dry eyes).    . bisacodyl (DULCOLAX) 10 MG suppository Place 10 mg rectally as needed for moderate constipation.    Marland Kitchen. HYDROcodone-acetaminophen (NORCO/VICODIN) 5-325 MG tablet Take 1 tablet by mouth every 12 (twelve) hours as needed for moderate pain. DNE 3gm of APAP/24hrs (Patient not taking: Reported on 01/20/2017) 60 tablet 0  . Magnesium Hydroxide (MILK OF MAGNESIA PO) Take 30 mLs by mouth as needed (for constipation).     No current facility-administered medications for this visit.  REVIEW OF SYSTEMS:  [X]  denotes positive finding, [ ]  denotes negative finding Cardiac  Comments:  Chest pain or chest pressure: x   Shortness of breath upon exertion: x   Short of breath when lying flat:    Irregular heart rhythm:        Vascular    Pain in calf, thigh, or hip brought on by ambulation: x   Pain in feet at night that wakes you up from your sleep:     Blood clot in your veins:    Leg swelling:  x         PHYSICAL EXAM: Vitals:   01/20/17 1558  BP: 120/70  Pulse: 69  Resp: 16  Temp: 98.3 F (36.8 C)  TempSrc: Oral  SpO2: 99%  Weight: 130 lb (59 kg)  Height: 5\' 1"  (1.549 m)    GENERAL: The patient is a well-nourished  female, in no acute distress. The vital signs are documented above. CARDIOVASCULAR: 2+ radial pulses bilaterally. Left brachial pulse with a 2+ pulse. Thrombosed left brachiocephalic fistula PULMONARY: There is good air exchange  MUSCULOSKELETAL: There are no major deformities or cyanosis. NEUROLOGIC: No focal weakness or paresthesias are detected. SKIN: There are no ulcers or rashes noted. PSYCHIATRIC: The patient has a normal affect.  DATA:  Right arm vascular lab studies reveal small cephalic and basilic vein.  MEDICAL ISSUES: Your best option would be a left upper arm AV graft. She has dialysis on Tuesday Thursday and Friday. She has no family. She has a neighbor who helps with transportation and is available on Thursdays only. Will arrange left upper arm AV graft placement on a Thursday. She will have overnight observation since she has no family who can stay with her.    Larina Earthlyodd F. Chanay Nugent, MD FACS [facility] Office Tel [phone] Pager [phone]

## 2017-01-31 NOTE — Anesthesia Preprocedure Evaluation (Addendum)
Anesthesia Evaluation  Patient identified by MRN, date of birth, ID band Patient awake    Reviewed: Allergy & Precautions, NPO status , Patient's Chart, lab work & pertinent test results  History of Anesthesia Complications Negative for: history of anesthetic complications  Airway Mallampati: I  TM Distance: >3 FB Neck ROM: Full    Dental  (+) Poor Dentition, Chipped, Loose, Missing, Dental Advisory Given   Pulmonary former smoker,    breath sounds clear to auscultation       Cardiovascular hypertension, Pt. on medications  Rhythm:Regular     Neuro/Psych  Headaches, PSYCHIATRIC DISORDERS Anxiety Depression    GI/Hepatic PUD, GERD  Medicated,  Endo/Other    Renal/GU ESRF and DialysisRenal disease     Musculoskeletal  (+) Arthritis ,   Abdominal   Peds  Hematology  (+) anemia ,   Anesthesia Other Findings   Reproductive/Obstetrics                            Anesthesia Physical Anesthesia Plan  ASA: III  Anesthesia Plan: General   Post-op Pain Management:    Induction: Intravenous  Airway Management Planned: Oral ETT  Additional Equipment: None  Intra-op Plan:   Post-operative Plan: Extubation in OR  Informed Consent: I have reviewed the patients History and Physical, chart, labs and discussed the procedure including the risks, benefits and alternatives for the proposed anesthesia with the patient or authorized representative who has indicated his/her understanding and acceptance.   Dental advisory given  Plan Discussed with: CRNA and Surgeon  Anesthesia Plan Comments:         Anesthesia Quick Evaluation

## 2017-01-31 NOTE — Anesthesia Postprocedure Evaluation (Addendum)
Anesthesia Post Note  Patient: Monica LimesMary S [name]  Procedure(s) Performed: Procedure(s) (LRB): REMOVAL OF ARTERIOVENOUS GORETEX GRAFT (AVGG) WITH Vein patching. (Left)  Patient location during evaluation: PACU Anesthesia Type: General Level of consciousness: awake and alert Pain management: pain level controlled Vital Signs Assessment: post-procedure vital signs reviewed and stable Respiratory status: spontaneous breathing, nonlabored ventilation, respiratory function stable and patient connected to nasal cannula oxygen Cardiovascular status: blood pressure returned to baseline and stable Postop Assessment: no signs of nausea or vomiting Anesthetic complications: no       Last Vitals:  Vitals:   01/31/17 1521 01/31/17 1602  BP: 120/64 (!) 149/66  Pulse: 95 93  Resp: 18 18  Temp:  37.1 C    Last Pain:  Vitals:   01/31/17 1602  TempSrc: Oral  PainSc: 7                  Neelie Welshans

## 2017-01-31 NOTE — Telephone Encounter (Signed)
   Telephone Call  Monica [name] is a 83 y.o. (Jun 17, 1932) female who underwent L FA AVG with Dr. Edilia Boickson on 01/29/17.  She was seen in the office on 01/30/17 with concerns of severe steal syndrome.  Pt's neighbor called with concerns that the patient's sx are significantly worse at this point.  Reported she last ate last night and none this AM.  - [facility], then will plan on completing a excision of L FA AVG.  Leonides SakeBrian Raydel Hosick, MD, FACS [facility] [place] Office: [phone] Pager: [phone]  01/31/2017, 10:29 AM

## 2017-01-31 NOTE — Progress Notes (Signed)
Patient arrived the unit on a stretcher from PACU, assessment completed see flowshhet, placed on tele ccmd notified, patient oriented to room and staff, bed in lowest position, call bell within reach , will continue to monitor

## 2017-01-31 NOTE — Interval H&P Note (Signed)
History and Physical Interval Note:  01/31/2017 11:34 AM  Monica [name]  has presented today for surgery, with the diagnosis of infected graft  The various methods of treatment have been discussed with the patient and family. After consideration of risks, benefits and other options for treatment, the patient has consented to  Procedure(s): REMOVAL OF ARTERIOVENOUS GORETEX GRAFT (AVGG) (Left) as a surgical intervention .  The patient's history has been reviewed, patient examined, no change in status, stable for surgery.  I have reviewed the patient's chart and labs.  Questions were answered to the patient's satisfaction.     Leonides SakeBrian Lorcan Shelp

## 2017-01-31 NOTE — Progress Notes (Signed)
Pt had 7 beat run of vtach. Pt resting in no apparent sign of distress pt asymptomatic. Returned to sinus tach. Will continue to monitor.

## 2017-01-31 NOTE — Op Note (Signed)
OPERATIVE NOTE   PROCEDURE: 1. Excision of left forearm arteriovenous graft  2. Vein patch angioplasty of brachial artery  PRE-OPERATIVE DIAGNOSIS: steal syndrome left hand  POST-OPERATIVE DIAGNOSIS: same as above   SURGEON: Leonides SakeBrian Chen, MD  ANESTHESIA: general  ESTIMATED BLOOD LOSS: 100 cc  FINDING(S): 1.  Hematoma in forearm and within tunnel 2.  Palpable brachial pulse and radial pulse at end of case  SPECIMEN(S):  none  INDICATIONS:   Monica [name] is a 83 y.o. female who presents with severe left hand steal symptoms since placement of a left forearm arteriovenous graft.  Dr. Randie Heinzain had recommended the patient consider removal of the left arteriovenous graft if her symptoms progressed.  Her symptoms did significant worsen so I recommended removal of the left arteriovenous graft.  Risk, benefits, and alternatives to access surgery were discussed.  The patient is aware the risks include but are not limited to: bleeding, infection, nerve damage, need for additional procedures, death and stroke.  The patient agrees to proceed forward with the procedure. .  DESCRIPTION: After obtaining full informed written consent, the patient was brought back to the operating room and placed supine upon the operating table.  The patient received IV antibiotics prior to induction.  After obtaining adequate anesthesia, the patient was prepped and draped in the standard fashion for: left arm access procedure.  I took off the Dermabond at both incisions.    I first sharply removed the sutures in the subcutaneous tissue overlying the two anastomoses.  I dissected easily down to the two anastomoses.  There was a significant amount of clot in this exposure.  In dissecting out the brachial artery, I felt the anatomy was not clear due all the adherent tissue and clot, so I elected to place a sterile tourniquet on the upper arm.  I gave the patient 6500 units of Heparin which was a therapeutic bolus of  heparin.  After waiting 3 minutes, I exsanguinated the left arm with anm Esmark tourniquet.  I inflated the tourniquet at 250 mm Hg.    I tied off the brachial vein proximal to the anastomosis with a 2-0 silk tie and a titanium clip, leaving me a length of vein to patch the artery.  I transected the brachial vein and then sharply removed this small segment of vein off the venous end of the graft.  I then sharply removed the sutures in the apical incision and then dissected down to the graft.  I transected the graft to facilitate removal.  I removed the venous limb of this graft.  I then pulled out the arterial limb of this graft.  I sharply removed the graft off the brachial artery.  I sharply tailored the brachial vein segment for the geometry of the arteriotomy.  I sewed the vein onto the arteriotomy with a running stitch of 6-0 Prolene.  Prior to completing this vein patch, I dropped the tourniquet which holding pressure on the distal brachial artery.  This allowed the proximal end to bleed.  I then compressed the proximal end and allowed the distal end to bleed.  I then compressed both ends and washed out the lumen of the brachial artery.  I completed this vein patch angioplasty in the usual fashion.  All compression was released.  There was an easily palpable brachial and radial pulse.  I tried to doppler an ulnar signal but could not find one.  I gave the patient 40 mg of Protamine to reverse anticoagulation.  I then packed all incisions with Avitene.  After a few minutes, I repaired a few pleats in the vein patch with 6-0 Prolene stitches.  I repacked the antecubital incision.  Distally there was no active bleeding from the apical incision but there was decompressing hematoma in the tunnel.  I milked out some the hematoma and repacked each incision with Avitene.  After a few more minutes, there was no further active bleeding but there appears to be some ooze from the tunnel.  I elected to managed this  with external compression.  I repaired each incision with a double layer of 3-0 Vicryl.  The skin at both incisions was reapproximated with staples to allow decompression of any bleeding, not unexpected given the ooze in the tunnel.  I placed sterile 4x4 gauze over each incision and then wrapped the arm with a Kerlix.  An ACE wrap was gently applied to get some compression on the entire forearm.     COMPLICATIONS: none  CONDITION: stable   Leonides Sake, MD, [facility] of [place] Office: [phone] Pager: [phone]  01/31/2017, 1:43 PM

## 2017-01-31 NOTE — Anesthesia Procedure Notes (Signed)
Procedure Name: Intubation Date/Time: 01/31/2017 12:26 PM Performed by: Barrington Ellison Pre-anesthesia Checklist: Patient identified, Emergency Drugs available, Suction available and Patient being monitored Patient Re-evaluated:Patient Re-evaluated prior to inductionOxygen Delivery Method: Circle System Utilized Preoxygenation: Pre-oxygenation with 100% oxygen Intubation Type: IV induction, Rapid sequence and Cricoid Pressure applied Ventilation: Mask ventilation without difficulty Laryngoscope Size: Mac and 3 Grade View: Grade I Tube type: Oral Tube size: 7.0 mm Number of attempts: 1 Airway Equipment and Method: Stylet and Oral airway Placement Confirmation: ETT inserted through vocal cords under direct vision,  positive ETCO2 and breath sounds checked- equal and bilateral Secured at: 22 cm Tube secured with: Tape Dental Injury: Teeth and Oropharynx as per pre-operative assessment

## 2017-01-31 NOTE — Transfer of Care (Signed)
Immediate Anesthesia Transfer of Care Note  Patient: Monica LimesMary S Montecalvo  Procedure(s) Performed: Procedure(s): REMOVAL OF ARTERIOVENOUS GORETEX GRAFT (AVGG) WITH Vein patching. (Left)  Patient Location: PACU  Anesthesia Type:General  Level of Consciousness: awake, alert  and oriented  Airway & Oxygen Therapy: Patient Spontanous Breathing  Post-op Assessment: Report given to RN  Post vital signs: Reviewed and stable  Last Vitals:  Vitals:   01/31/17 1142  BP: (!) 114/49  Pulse: 100  Resp: 16  Temp: 36.9 C    Last Pain:  Vitals:   01/31/17 1142  TempSrc: Oral      Patients Stated Pain Goal: 2 (01/31/17 1142)  Complications: No apparent anesthesia complications

## 2017-02-01 ENCOUNTER — Encounter (HOSPITAL_COMMUNITY): Payer: Self-pay | Admitting: Vascular Surgery

## 2017-02-01 DIAGNOSIS — T82898A Other specified complication of vascular prosthetic devices, implants and grafts, initial encounter: Secondary | ICD-10-CM | POA: Diagnosis not present

## 2017-02-01 LAB — BASIC METABOLIC PANEL
Anion gap: 17 — ABNORMAL HIGH (ref 5–15)
BUN: 33 mg/dL — AB (ref 6–20)
CHLORIDE: 95 mmol/L — AB (ref 101–111)
CO2: 23 mmol/L (ref 22–32)
CREATININE: 7.47 mg/dL — AB (ref 0.44–1.00)
Calcium: 8.6 mg/dL — ABNORMAL LOW (ref 8.9–10.3)
GFR calc Af Amer: 5 mL/min — ABNORMAL LOW (ref >60)
GFR calc non Af Amer: 4 mL/min — ABNORMAL LOW (ref >60)
GLUCOSE: 108 mg/dL — AB (ref 65–99)
Potassium: 4.4 mmol/L (ref 3.5–5.1)
Sodium: 135 mmol/L (ref 135–145)

## 2017-02-01 LAB — CBC
HEMATOCRIT: 35.4 % — AB (ref 36.0–46.0)
HEMOGLOBIN: 11.3 g/dL — AB (ref 12.0–15.0)
MCH: 25.6 pg — AB (ref 26.0–34.0)
MCHC: 31.9 g/dL (ref 30.0–36.0)
MCV: 80.3 fL (ref 78.0–100.0)
Platelets: 75 10*3/uL — ABNORMAL LOW (ref 150–400)
RBC: 4.41 MIL/uL (ref 3.87–5.11)
RDW: 15.2 % (ref 11.5–15.5)
WBC: 10.2 10*3/uL (ref 4.0–10.5)

## 2017-02-01 MED ORDER — FAMOTIDINE 20 MG PO TABS: 20.0000 mg | ORAL_TABLET | Freq: Every day | ORAL | Status: DC

## 2017-02-01 MED ORDER — HYDROCODONE-ACETAMINOPHEN 5-325 MG PO TABS: 1.0000 | ORAL_TABLET | Freq: Four times a day (QID) | ORAL | 0 refills | Status: DC | PRN

## 2017-02-01 NOTE — Progress Notes (Addendum)
[facility] of [place]  Subjective  - Left arm hurts still.   Objective (!) 101/56 90 98.7 F (37.1 C) (Oral) 18 97%  Intake/Output Summary (Last 24 hours) at 02/01/17 0711 Last data filed at 01/31/17 1800  Gross per 24 hour  Intake              880 ml  Output              150 ml  Net              730 ml    Left hand and fingers warm to touch Left hand sensation intact, ace in place  Assessment/Planning: POD # 1 removal left forearm AV graft with vein patch angioplasty Afebrile and WBC 10.2 Dressing removed, less edema today compared to pre-op. Will discharge her home and f/u in 2 weeks to discuss further access and remove staple.   Clinton GallantCOLLINS, EMMA [facility] 02/01/2017 7:11 AM  Addendum  I have independently interviewed and examined the patient, and I agree with the physician assistant's findings.  L forearm less echymotic.  No bleeding from staples incisions.  Left hand warm.  Hand grip 4-5/5 due to pain.  Sensation improved.   - Ok to D/C - F/U in office in 2 weeks with Dr. Noralee Charsickson  Brian Chen, MD, FACS [facility] [place] [facility] Office: [phone] Pager: [phone]  02/01/2017, 8:09 AM   --  Laboratory Lab Results:  Recent Labs  01/31/17 1752 02/01/17 0228  WBC 11.3* 10.2  HGB 11.0* 11.3*  HCT 34.2* 35.4*  PLT 53* 75*   BMET  Recent Labs  01/31/17 1752 02/01/17 0228  NA 137 135  K 4.0 4.4  CL 98* 95*  CO2 23 23  GLUCOSE 124* 108*  BUN 27* 33*  CREATININE 6.89* 7.47*  CALCIUM 8.5* 8.6*    COAG Lab Results  Component Value Date   INR 1.10 03/20/2015   INR 0.93 12/28/2013   INR 1.05 08/21/2011   No results found for: PTT

## 2017-02-02 ENCOUNTER — Telehealth: Payer: Self-pay | Admitting: Vascular Surgery

## 2017-02-02 ENCOUNTER — Telehealth: Payer: Self-pay

## 2017-02-02 ENCOUNTER — Encounter: Payer: Self-pay | Admitting: Surgery

## 2017-02-02 ENCOUNTER — Ambulatory Visit (INDEPENDENT_AMBULATORY_CARE_PROVIDER_SITE_OTHER): Payer: Self-pay | Admitting: Surgery

## 2017-02-02 VITALS — BP 135/79 | HR 85 | Temp 98.7°F | Resp 20 | Ht 60.0 in | Wt 132.0 lb

## 2017-02-02 DIAGNOSIS — Z992 Dependence on renal dialysis: Secondary | ICD-10-CM

## 2017-02-02 DIAGNOSIS — N186 End stage renal disease: Secondary | ICD-10-CM

## 2017-02-02 NOTE — Discharge Summary (Signed)
[facility] Discharge Summary   Patient ID:  Monica [name] MRN: [id number] DOB/AGE: December 23, 1931 83 y.o.  Admit date: 01/31/2017 Discharge date: 02/02/2017 Date of Surgery: 01/31/2017 Surgeon: Surgeon(s): Fransisco Hertz, MD  Admission Diagnosis: removal  Discharge Diagnoses:  removal  Secondary Diagnoses: Past Medical History:  Diagnosis Date  . Anxiety   . Arthritis    "all over" (01/29/2017)  . Atrial flutter (HCC)   . Chronic back pain    "all my back" (01/29/2017)  . Constipation   . Dementia   . Depression   . ESRD (end stage renal disease) on dialysis [facility])    "MWF; Rudene Anda" (01/29/2017)  . ETOHism (HCC)    Quit in the 1980s.  . Gastric ulcer 12/2013.   Prepyloric ulcer. On follow-up EGD in 02/2014 this was 90% healed  . GERD (gastroesophageal reflux disease)   . Headache    as a young woman  . Hemorrhoids   . History of kidney stones   . History of stomach ulcers   . Hypertension   . Pancreatic cyst 09/2010.   At uncinate process.  Serial CT imaging favors benign process    Procedure(s): REMOVAL OF ARTERIOVENOUS GORETEX GRAFT (AVGG) WITH Vein patching.  Discharged Condition: good  HPI: Monica [name] is a 83 y.o. female who underwent a left forearm graft placement on 01/29/2017 by Dr. Edilia Bo.  She developed a steal syndrome and on 2-24 she underwent removal of the forearm graft with vein patch angioplasty of the brachial artery by Dr. Imogene Burn.  Hospital Course:  Monica [name] is a 83 y.o. female is S/P Left Procedure(s): REMOVAL OF ARTERIOVENOUS GORETEX GRAFT (AVGG) WITH Vein patching.  Consults:  POD#1  L forearm less echymotic.  No bleeding from staples incisions.  Left hand warm.  Hand grip 4-5/5 due to pain.  Sensation improved. Discharge home f/u with Dr. Edilia Bo in 2 weeks.     Significant Diagnostic Studies: CBC Lab Results  Component Value Date   WBC 10.2 02/01/2017   HGB 11.3 (L) 02/01/2017   HCT 35.4 (L) 02/01/2017   MCV  80.3 02/01/2017   PLT 75 (L) 02/01/2017    BMET    Component Value Date/Time   NA 135 02/01/2017 0228   K 4.4 02/01/2017 0228   CL 95 (L) 02/01/2017 0228   CO2 23 02/01/2017 0228   GLUCOSE 108 (H) 02/01/2017 0228   BUN 33 (H) 02/01/2017 0228   CREATININE 7.47 (H) 02/01/2017 0228   CALCIUM 8.6 (L) 02/01/2017 0228   GFRNONAA 4 (L) 02/01/2017 0228   GFRAA 5 (L) 02/01/2017 0228   COAG Lab Results  Component Value Date   INR 1.10 03/20/2015   INR 0.93 12/28/2013   INR 1.05 08/21/2011     Disposition:  Discharge to :Home Discharge Instructions    Call MD for:  redness, tenderness, or signs of infection (pain, swelling, bleeding, redness, odor or green/yellow discharge around incision site)    Complete by:  As directed    Call MD for:  severe or increased pain, loss or decreased feeling  in affected limb(s)    Complete by:  As directed    Call MD for:  temperature >100.5    Complete by:  As directed    Care order/instruction    Complete by:  As directed    Sling to left UE for comfort   Discharge instructions    Complete by:  As directed    You may wash  your arm with soap and water in 48 hours.  Sling for comfort, elevate your hand above the elbow and move your fingers often.  Keep ace on for 48 hours then remove and wash.   Increase activity slowly    Complete by:  As directed    Walk with assistance use walker or cane as needed   Resume previous diet    Complete by:  As directed      Allergies as of 02/01/2017      Reactions   Aspirin Other (See Comments)   "makes my stomach hurt"   Codeine Other (See Comments)   "i get irritated"      Medication List    TAKE these medications   acetaminophen 500 MG tablet Commonly known as:  TYLENOL Take 1,000 mg by mouth every 6 (six) hours as needed for mild pain.   ALPRAZolam 0.5 MG tablet Commonly known as:  XANAX Take 1 tablet (0.5 mg total) by mouth 2 (two) times daily.   HYDROcodone-acetaminophen 5-325 MG  tablet Commonly known as:  NORCO/VICODIN Take 1 tablet by mouth every 12 (twelve) hours as needed for moderate pain. DNE 3gm of APAP/24hrs   metoprolol tartrate 25 MG tablet Commonly known as:  LOPRESSOR Take 12.5 mg by mouth 2 (two) times daily.   nitroGLYCERIN 0.4 MG SL tablet Commonly known as:  NITROSTAT Place 0.4 mg under the tongue every 5 (five) minutes as needed for chest pain.   pantoprazole 40 MG tablet Commonly known as:  PROTONIX Take 1 tablet (40 mg total) by mouth daily.   VISINE OP Place 2 drops into both eyes daily as needed (dry eyes).      Verbal and written Discharge instructions given to the patient. Wound care per Discharge AVS   Signed: Clinton GallantCOLLINS, EMMA [facility] Center[facility]EN 02/02/2017, 3:40 PM  Addendum  I agree with the physician assistant's findings discharge summary.  This patient underwent removal of the left forearm arteriovenous graft for steal syndrome.  This was an uneventful case with uneventful post-op course.  Leonides SakeBrian Elyn Krogh, MD, FACS [facility] [place] Office: [phone] Pager: [phone]  02/04/2017, 10:38 AM

## 2017-02-02 NOTE — Progress Notes (Signed)
   Patient name: Monica [name] MRN: [id number] DOB: 1932/04/02 Sex: female  REASON FOR VISIT:     post op  HISTORY OF PRESENT ILLNESS:   Monica [name] is a 83 y.o. female who underwent a left forearm graft placement on 01/29/2017 by Dr. Edilia Boickson.  She developed a steal syndrome and on 2-24 she underwent removal of the forearm graft with vein patch angioplasty of the brachial artery by Dr. Imogene Burnhen.  She called the office and was added on today for left hand pain and coolness and swelling.  CURRENT MEDICATIONS:    Current Outpatient Prescriptions  Medication Sig Dispense Refill  . acetaminophen (TYLENOL) 500 MG tablet Take 1,000 mg by mouth every 6 (six) hours as needed for mild pain.    Marland Kitchen. ALPRAZolam (XANAX) 0.5 MG tablet Take 1 tablet (0.5 mg total) by mouth 2 (two) times daily. 60 tablet 0  . HYDROcodone-acetaminophen (NORCO/VICODIN) 5-325 MG tablet Take 1 tablet by mouth every 12 (twelve) hours as needed for moderate pain. DNE 3gm of APAP/24hrs 30 tablet 0  . metoprolol tartrate (LOPRESSOR) 25 MG tablet Take 12.5 mg by mouth 2 (two) times daily.     . nitroGLYCERIN (NITROSTAT) 0.4 MG SL tablet Place 0.4 mg under the tongue every 5 (five) minutes as needed for chest pain.    . pantoprazole (PROTONIX) 40 MG tablet Take 1 tablet (40 mg total) by mouth daily. 30 tablet 3  . Tetrahydrozoline HCl (VISINE OP) Place 2 drops into both eyes daily as needed (dry eyes).     No current facility-administered medications for this visit.     REVIEW OF SYSTEMS:   [X]  denotes positive finding, [ ]  denotes negative finding Cardiac  Comments:  Chest pain or chest pressure:    Shortness of breath upon exertion:    Short of breath when lying flat:    Irregular heart rhythm:    Constitutional    Fever or chills:      PHYSICAL EXAM:   Vitals:   02/02/17 1059  BP: 135/79  Pulse: 85  Resp: 20  Temp: 98.7 F (37.1 C)  TempSrc: Oral  SpO2: 100%  Weight: 132 lb (59.9  kg)  Height: 5' (1.524 m)    GENERAL: The patient is a well-nourished female, in no acute distress. The vital signs are documented above. CARDIOVASCULAR: There is a regular rate and rhythm. PULMONARY: Non-labored respirations She continues to have a palpable left radial pulse.  Her hand is markedly swollen and very warm.  She was using the Ace wrap beginning at the wrist.  There is some drainage from her staple incisions   STUDIES:   none   MEDICAL ISSUES:   Suspect the patient's left hand swelling is because the Ace wrap was too tight and she had a beginning at the wrist.  I have instructed her to keep her arm elevated and if she uses an Ace wrap to make sure that the hand is included.  She has an appointment this week which I will cancel.  I have her coming back to see Dr. Imogene Burnhen for wound check and staple removal in 2 weeks.  Durene CalWells Mann Skaggs, MD [facility] Tel [phone] Pager [phone]

## 2017-02-02 NOTE — Telephone Encounter (Signed)
rec'd phone call from pt's caregiver.  Reported the pt's left hand is "extremely swollen, and ice cold".  Reported the pt. Has been elevating the left arm at intervals, above level of heart.  Stated she feels the pt. Needs to be seen ASAP.  Appt. Given at 11:00 AM with Dr. Myra GianottiBrabham; agreed.

## 2017-02-02 NOTE — Telephone Encounter (Signed)
Pt in office today, received appt w/ AVS.

## 2017-02-02 NOTE — Telephone Encounter (Signed)
-----   Message from Sharee PimpleMarilyn K McChesney, RN sent at 02/02/2017 11:01 AM EST ----- Regarding: 2 weeks staple removal   ----- Message ----- From: Fransisco HertzBrian L Chen, MD Sent: 01/31/2017   1:57 PM To: V[facility] [id number]  Malva LimesMary S Volden [id number] 1932/12/04  Procedure:  1.  Excision of left FA AVG 2.  VPA L brachial artery  Follow-up: 2 weeks for staple removal with Dr. Edilia Boickson

## 2017-02-04 ENCOUNTER — Ambulatory Visit: Payer: Medicare Other | Admitting: Vascular Surgery

## 2017-02-06 ENCOUNTER — Telehealth: Payer: Self-pay | Admitting: Vascular Surgery

## 2017-02-06 ENCOUNTER — Encounter: Payer: Self-pay | Admitting: Vascular Surgery

## 2017-02-06 NOTE — Telephone Encounter (Signed)
-----   Message from Sharee PimpleMarilyn K McChesney, RN sent at 02/06/2017 11:12 AM EST ----- Regarding: RE: Appt with [facility], 2 weeks for staple removal and discuss new access No make appt according to Maureen's note below. Patient had other procedure ----- Message ----- From: Jena GaussMichele A Roczniak Sent: 02/06/2017  11:07 AM To: Sharee PimpleMarilyn K McChesney, RN Subject: RE: Appt with [facility], 2 weeks for staple remova#  According to the appt note [facility] put it on [facility]'s sch as per blue d/c 02/02/17 because pt has MWF dialysis next door and wants to come over right after.  ----- Message ----- From: Sharee PimpleMarilyn K McChesney, RN Sent: 02/06/2017  10:58 AM To: Donita BrooksVvs-[place] Admin Pool Subject: Appt with [facility], 2 weeks for staple removal an#    ----- Message ----- From: Lars MageEmma M Collins, PA-C Sent: 02/06/2017  10:20 AM To: Sharee PimpleMarilyn K McChesney, RN  F/u 2 weeks with Dr. Edilia Boickson, not chen can you check on this thanks [facility] ----- Message ----- From: Sharee PimpleMarilyn K McChesney, RN Sent: 02/01/2017   9:21 PM To: Lars MageEmma M Collins, PA-C  What time frame?   ----- Message ----- From: Lars MageEmma M Collins, PA-C Sent: 02/01/2017   8:02 AM To: [facility] Charge Pool  F/U with Dr. Edilia Boickson for pre-op planning of new access and needs staples removed left UE

## 2017-02-06 NOTE — Telephone Encounter (Signed)
Resched appt 02/18/17 at 10:45. Spoke to pt to inform them of new appt.

## 2017-02-18 ENCOUNTER — Encounter: Payer: Medicare Other | Admitting: Vascular Surgery

## 2017-02-20 ENCOUNTER — Encounter: Payer: Self-pay | Admitting: Vascular Surgery

## 2017-02-20 ENCOUNTER — Ambulatory Visit (INDEPENDENT_AMBULATORY_CARE_PROVIDER_SITE_OTHER): Payer: Self-pay | Admitting: Vascular Surgery

## 2017-02-20 ENCOUNTER — Encounter: Payer: Medicare Other | Admitting: Vascular Surgery

## 2017-02-20 VITALS — BP 140/76 | HR 74 | Temp 98.3°F | Resp 16 | Ht 61.0 in | Wt 133.0 lb

## 2017-02-20 DIAGNOSIS — T82898D Other specified complication of vascular prosthetic devices, implants and grafts, subsequent encounter: Secondary | ICD-10-CM

## 2017-02-20 NOTE — Progress Notes (Signed)
    Postoperative Access Visit   History of Present Illness  Monica [name] is a 83 y.o. year old female who presents for postoperative follow-up for: Exc L FA AVF, VPA brachial artery (Date: 01/31/17).  The patient's wounds are healed.  The patient notes no steal symptoms.  The patient is able to complete their activities of daily living.  The patient's current symptoms are: some thumb weakness.  For VQI Use Only  PRE-ADM LIVING: Nursing home  AMB STATUS: Ambulatory  Physical Examination Vitals:   02/20/17 1339  BP: 140/76  Pulse: 74  Resp: 16  Temp: 98.3 F (36.8 C)    LUE: Incisions are healed, arm is no longer swollen, skin feels warm, hand grip is 5/5 except for thump 3-4/5, sensation in digits is intact, palpable thrill, bruit can be auscultated    Medical Decision Making  Monica [name] is a 83 y.o. year old female who presents s/p Exc L FA AVF, VPA brachial artery.   Refer pt to OT for further rehab.  Re-evaluate patient in 3 month for further access.  Thank you for allowing us to participate in this patient's care.  Leonides SakeBrian Esiquio Boesen, MD, FACS [facility] [place] Office: [phone] Pager: [phone]

## 2017-02-27 ENCOUNTER — Encounter: Payer: Medicare Other | Admitting: Vascular Surgery

## 2017-03-03 ENCOUNTER — Ambulatory Visit: Payer: Medicare Other | Attending: Vascular Surgery | Admitting: Occupational Therapy

## 2017-03-03 ENCOUNTER — Encounter: Payer: Self-pay | Admitting: Occupational Therapy

## 2017-03-03 DIAGNOSIS — R29818 Other symptoms and signs involving the nervous system: Secondary | ICD-10-CM | POA: Diagnosis present

## 2017-03-03 NOTE — Therapy (Signed)
[facility] [place] [facility] [address] [place], [place], [address] Phone: [phone]   Fax:  [phone]  Occupational Therapy Treatment  Patient Details  Name: Monica [name] MRN: [id number] Date of Birth: 15-Jul-1932 Referring Provider: Dr. Leonides Sake  Encounter Date: 03/03/2017      OT End of Session - 03/03/17 1141    Visit Number 1   Number of Visits 1   Date for OT Re-Evaluation --  n/a   Authorization Type medicare will need G code   OT Start Time 1101   OT Stop Time 1140   OT Time Calculation (min) 39 min   Activity Tolerance Patient tolerated treatment well      Past Medical History:  Diagnosis Date  . Anxiety   . Arthritis    "all over" (01/29/2017)  . Atrial flutter (HCC)   . Chronic back pain    "all my back" (01/29/2017)  . Constipation   . Dementia   . Depression   . ESRD (end stage renal disease) on dialysis [facility])    "MWF; Rudene Anda" (01/29/2017)  . ETOHism (HCC)    Quit in the 1980s.  . Gastric ulcer 12/2013.   Prepyloric ulcer. On follow-up EGD in 02/2014 this was 90% healed  . GERD (gastroesophageal reflux disease)   . Headache    as a young woman  . Hemorrhoids   . History of kidney stones   . History of stomach ulcers   . Hypertension   . Pancreatic cyst 09/2010.   At uncinate process.  Serial CT imaging favors benign process    Past Surgical History:  Procedure Laterality Date  . ABDOMINAL HYSTERECTOMY    . ARTERIOVENOUS GRAFT PLACEMENT Left 01/29/2017   INSERTION OF ARTERIOVENOUS (AV) GORE-TEX GRAFT  STRETCHED IN LEFT FORE ARM   . AV FISTULA PLACEMENT Left 03/20/2015   Procedure: ARTERIOVENOUS (AV) FISTULA CREATION;  Surgeon: Chuck Hint, MD;  Location: [facility] OR;  Service: Vascular;  Laterality: Left;  . AV FISTULA PLACEMENT Left 01/29/2017   Procedure: INSERTION OF ARTERIOVENOUS (AV) GORE-TEX GRAFT  STRETCHED IN LEFT FORE ARM;  Surgeon: Chuck Hint, MD;  Location: [facility] OR;   Service: Vascular;  Laterality: Left;  . AVGG REMOVAL Left 01/31/2017   Procedure: REMOVAL OF ARTERIOVENOUS GORETEX GRAFT (AVGG) WITH Vein patching.;  Surgeon: Fransisco Hertz, MD;  Location: [facility] OR;  Service: Vascular;  Laterality: Left;  . CATARACT EXTRACTION Right   . COLONOSCOPY  2010   Per Dr. Loreta Ave  . ESOPHAGOGASTRODUODENOSCOPY N/A 12/29/2013   Procedure: ESOPHAGOGASTRODUODENOSCOPY (EGD);  Surgeon: Hart Carwin, MD;  Location[facility] ENDOSCOPY;  Service: Endoscopy;  Laterality: N/A;  . EXPLORATORY LAPAROTOMY  1980   w/LOA  . FLEXIBLE SIGMOIDOSCOPY  08/2011   Per Dr. Randa Evens. Normal study. Random biopsies positive for trace melanosis coli.  Marland Kitchen FOOT SURGERY Bilateral    "surgery for flat feet"  . INSERTION OF DIALYSIS CATHETER Right 11/25/2016   Procedure: INSERTION OF Right Internal jugular DIALYSIS CATHETER;  Surgeon: Fransisco Hertz, MD;  Location: [facility] OR;  Service: Vascular;  Laterality: Right;  . SHOULDER ARTHROSCOPY W/ ROTATOR CUFF REPAIR Right 08/2003   Hattie Perch 01/29/2017  . TIBIA FRACTURE SURGERY Right    "broke between my knee and my ankle"  . TOTAL ABDOMINAL HYSTERECTOMY W/ BILATERAL SALPINGOOPHORECTOMY  1980  . TUBAL LIGATION      There were no vitals filed for this visit.      Subjective Assessment - 03/03/17 1105  Patient is accompained by: --  caregiver [place]   Pertinent History S/p placement of LFA, VFF, VPA brachial artery shunt for diaylysis with resultant steal sydrome intiitally. Currently no futher evidence of steal syndrome however presents with L thumb weakness.   Currently in Pain? Yes   Pain Score 5    Pain Location Flank   Pain Orientation Left   Pain Descriptors / Indicators Sore   Pain Type Chronic pain   Pain Onset Other (comment)  several months pt has seen MD for this   Pain Frequency Constant   Aggravating Factors  its there all the time but its worst when I lay down at night and try and turn over   Pain Relieving Factors tylenol            [facility] OT  Assessment - 03/03/17 0001      Assessment   Diagnosis s/p steal syndrome with resultant L thumb weakness   Referring Provider Dr. Leonides Sake   Onset Date 01/31/17   Prior Therapy none known     Precautions   Precautions None     Restrictions   Weight Bearing Restrictions No     Balance Screen   Has the patient fallen in the past 6 months Yes  pt states it was just this one fall    How many times? 1  fell in bathroom     Home  Environment   Family/patient expects to be discharged to: Private residence   Living Arrangements Alone   Available Help at Discharge Available PRN/intermittently  caregiver 2-3 days a week/ADL's and IADLs   Type of Home House   Home Layout One level   Bathroom Shower/Tub Sport and exercise psychologist   Additional Comments Pt has shower seat. Pt has suction cup grab bars but has not yet installed these - states "I will when I need to."  Caregiver assists with shower. Pt states she has no difficulty getting on and off toilet     Prior Function   Level of Independence Independent with basic ADLs  pt uses cane intermittently inside    Vocation Retired     ADL   Eating/Feeding Independent   Grooming Independent   Upper Body Bathing Moderate assistance  caregiver assist pt has port and it can't get wet   Lower Body Bathing Moderate assistance   Upper Body Dressing Independent   Lower Body Dressing Independent   Toilet Tranfer Independent   Toileting - Clothing Manipulation Independent   Toileting -  Hygiene Independent   Tub/Shower Transfer Minimal assistance  caregiver assist with transfers   ADL comments Pt has caregiver to assist with shower. Pt has port that cannot get wet and pt has dementia.  Pt states "we have it worked out just fine and I like Gina helping me."     IADL   Shopping Needs to be accompanied on any shopping trip  caregiver or neighbor helps   Light Housekeeping Performs light daily tasks but cannot maintain  acceptable level of cleanliness  caregiver assists pt   Meal Prep Plans, prepares and serves adequate meals independently   Medication Management Has difficulty remembering to take medication  neighbor orders, picks up and organized meds   Financial Management Requires assistance     Mobility   Mobility Status History of falls   Mobility Status Comments Needs assistance in community - pt has both caregiver and neighbor who assist and pt feels well supported.  Written Expression   Dominant Hand Right     Vision - History   Baseline Vision Wears glasses all the time   Additional Comments Pt denies any other visual problems.      Cognition   Overall Cognitive Status History of cognitive impairments - at baseline   Mini Mental State Exam  Pt with dementia     Sensation   Light Touch Impaired by gross assessment  pt can feel light touch but "it feels asleep"   Hot/Cold Appears Intact   Proprioception Appears Intact     Coordination   Gross Motor Movements are Fluid and Coordinated Yes   Fine Motor Movements are Fluid and Coordinated No   Coordination and Movement Description Pt with tremor in B hands.  Pt states tremor has been there for significant time.      Edema   Edema none present     ROM / Strength   AROM / PROM / Strength AROM;Strength     AROM   Overall AROM  Within functional limits for tasks performed     Strength   Overall Strength Within functional limits for tasks performed     Hand Function   Right Hand Gross Grasp Functional   Right Hand Grip (lbs) 45   Right Hand Lateral Pinch 12 lbs   Left Hand Gross Grasp Functional   Left Hand Grip (lbs) 40   Left Hand Lateral Pinch 11 lbs                               OT Long Term Goals - 03-20-2017 1136      OT LONG TERM GOAL #1   Title n/a               Plan - Mar 20, 2017 1136    Clinical Impression Statement Pt is an 83 year old female s/p LFA,VFF, VPA brachial artery shunt  for dialysis.  Pt initially with steal syndrome however this has resolved.  Pt referred for L thumb weakness.  PMH:  anxiety, arthritis, FIB, chronic back pain, ESRD, dementia, depression.  Pt today presents only with sensory changes on the ventral and lateral aspects of her L thumb.  Pt with normal grip and LUE strength.  Pt has caregiver 2-3 times per week to assist with showering and IADL's as well as neighbor who manages pt's care and medications.  Pt states "I like this the way it is."  Pt and caregiver educated regarding sensory impairment and no futher OT recomennded at this time.    Rehab Potential --  n/a   Plan no further OT needs at this time   Consulted and Agree with Plan of Care Patient;Family member/caregiver   Family Member Consulted caregiver Almira Coaster      Patient will benefit from skilled therapeutic intervention in order to improve the following deficits and impairments:  Impaired sensation  Visit Diagnosis: Other symptoms and signs involving the nervous system      G-Codes - Mar 20, 2017 1142    Functional Assessment Tool Used (Outpatient only) dynamometer, pinch meter   Functional Limitation Carrying, moving and handling objects   Carrying, Moving and Handling Objects Current Status (Z6109) At least 1 percent but less than 20 percent impaired, limited or restricted   Carrying, Moving and Handling Objects Goal Status (U0454) At least 1 percent but less than 20 percent impaired, limited or restricted   Carrying, Moving and Handling Objects Discharge Status (  [id number]) At least 1 percent but less than 20 percent impaired, limited or restricted      Problem List Patient Active Problem List   Diagnosis Date Noted  . Steal syndrome as complication of dialysis access (HCC) 01/31/2017  . ESRD on dialysis (HCC) 01/29/2017  . Left sided chest pain 10/28/2016  . Protein-calorie malnutrition, severe 10/25/2016  . Uremia 10/23/2016  . Dementia 10/23/2016  . Anemia, chronic renal  failure, stage 5 (HCC) 10/23/2016  . Unintentional weight loss 10/23/2016  . Pancreatic cyst   . Hematuria   . Encounter for nasogastric (NG) tube placement   . SBO (small bowel obstruction)   . Abscess of buttock 12/29/2013  . HTN (hypertension) 12/29/2013  . PAF (paroxysmal atrial fibrillation) (HCC) 12/29/2013  . Melena 12/29/2013  . CKD (chronic kidney disease) stage 5, GFR less than 15 ml/min (HCC) 12/29/2013  . Escherichia coli urinary tract infection 12/29/2013  . Gastric ulcer with hemorrhage 12/29/2013  . Upper GI bleed 12/28/2013    Norton Pastelulaski, Lexus Shampine Halliday , OTR/L 03/03/2017, 11:44 AM  [facility] [address] [place], [place], [address] Phone: [phone]   Fax:  [phone]  Name: Monica [name] MRN: [id number] Date of Birth: 1932-08-31

## 2017-03-09 ENCOUNTER — Emergency Department (HOSPITAL_COMMUNITY): Payer: Medicare Other

## 2017-03-09 ENCOUNTER — Encounter (HOSPITAL_COMMUNITY): Payer: Self-pay | Admitting: Physician Assistant

## 2017-03-09 ENCOUNTER — Inpatient Hospital Stay (HOSPITAL_COMMUNITY)
Admission: EM | Admit: 2017-03-09 | Discharge: 2017-03-19 | DRG: 469 | Disposition: A | Discharge status: Skilled Nursing Facility | Payer: Medicare Other | Attending: Family Medicine | Admitting: Family Medicine

## 2017-03-09 DIAGNOSIS — I2109 ST elevation (STEMI) myocardial infarction involving other coronary artery of anterior wall: Secondary | ICD-10-CM | POA: Diagnosis present

## 2017-03-09 DIAGNOSIS — I4892 Unspecified atrial flutter: Secondary | ICD-10-CM | POA: Diagnosis present

## 2017-03-09 DIAGNOSIS — Y92009 Unspecified place in unspecified non-institutional (private) residence as the place of occurrence of the external cause: Secondary | ICD-10-CM

## 2017-03-09 DIAGNOSIS — E875 Hyperkalemia: Secondary | ICD-10-CM | POA: Diagnosis present

## 2017-03-09 DIAGNOSIS — L723 Sebaceous cyst: Secondary | ICD-10-CM | POA: Diagnosis present

## 2017-03-09 DIAGNOSIS — I213 ST elevation (STEMI) myocardial infarction of unspecified site: Secondary | ICD-10-CM | POA: Diagnosis not present

## 2017-03-09 DIAGNOSIS — D631 Anemia in chronic kidney disease: Secondary | ICD-10-CM | POA: Diagnosis present

## 2017-03-09 DIAGNOSIS — Z8711 Personal history of peptic ulcer disease: Secondary | ICD-10-CM

## 2017-03-09 DIAGNOSIS — I472 Ventricular tachycardia: Secondary | ICD-10-CM | POA: Diagnosis not present

## 2017-03-09 DIAGNOSIS — Z992 Dependence on renal dialysis: Secondary | ICD-10-CM

## 2017-03-09 DIAGNOSIS — R011 Cardiac murmur, unspecified: Secondary | ICD-10-CM | POA: Diagnosis present

## 2017-03-09 DIAGNOSIS — M542 Cervicalgia: Secondary | ICD-10-CM | POA: Diagnosis present

## 2017-03-09 DIAGNOSIS — N2581 Secondary hyperparathyroidism of renal origin: Secondary | ICD-10-CM | POA: Diagnosis present

## 2017-03-09 DIAGNOSIS — F419 Anxiety disorder, unspecified: Secondary | ICD-10-CM | POA: Diagnosis present

## 2017-03-09 DIAGNOSIS — I480 Paroxysmal atrial fibrillation: Secondary | ICD-10-CM | POA: Diagnosis present

## 2017-03-09 DIAGNOSIS — S72001A Fracture of unspecified part of neck of right femur, initial encounter for closed fracture: Secondary | ICD-10-CM | POA: Diagnosis not present

## 2017-03-09 DIAGNOSIS — Y92013 Bedroom of single-family (private) house as the place of occurrence of the external cause: Secondary | ICD-10-CM

## 2017-03-09 DIAGNOSIS — R32 Unspecified urinary incontinence: Secondary | ICD-10-CM | POA: Diagnosis present

## 2017-03-09 DIAGNOSIS — G8929 Other chronic pain: Secondary | ICD-10-CM | POA: Diagnosis present

## 2017-03-09 DIAGNOSIS — K862 Cyst of pancreas: Secondary | ICD-10-CM | POA: Diagnosis present

## 2017-03-09 DIAGNOSIS — K219 Gastro-esophageal reflux disease without esophagitis: Secondary | ICD-10-CM | POA: Diagnosis present

## 2017-03-09 DIAGNOSIS — E440 Moderate protein-calorie malnutrition: Secondary | ICD-10-CM | POA: Diagnosis present

## 2017-03-09 DIAGNOSIS — K0889 Other specified disorders of teeth and supporting structures: Secondary | ICD-10-CM | POA: Diagnosis present

## 2017-03-09 DIAGNOSIS — M25511 Pain in right shoulder: Secondary | ICD-10-CM | POA: Diagnosis present

## 2017-03-09 DIAGNOSIS — S72011A Unspecified intracapsular fracture of right femur, initial encounter for closed fracture: Secondary | ICD-10-CM | POA: Diagnosis present

## 2017-03-09 DIAGNOSIS — I2510 Atherosclerotic heart disease of native coronary artery without angina pectoris: Secondary | ICD-10-CM | POA: Diagnosis present

## 2017-03-09 DIAGNOSIS — Z87891 Personal history of nicotine dependence: Secondary | ICD-10-CM

## 2017-03-09 DIAGNOSIS — L89151 Pressure ulcer of sacral region, stage 1: Secondary | ICD-10-CM | POA: Diagnosis present

## 2017-03-09 DIAGNOSIS — N186 End stage renal disease: Secondary | ICD-10-CM | POA: Diagnosis present

## 2017-03-09 DIAGNOSIS — G92 Toxic encephalopathy: Secondary | ICD-10-CM | POA: Diagnosis present

## 2017-03-09 DIAGNOSIS — R197 Diarrhea, unspecified: Secondary | ICD-10-CM | POA: Diagnosis not present

## 2017-03-09 DIAGNOSIS — F329 Major depressive disorder, single episode, unspecified: Secondary | ICD-10-CM | POA: Diagnosis present

## 2017-03-09 DIAGNOSIS — Z79899 Other long term (current) drug therapy: Secondary | ICD-10-CM

## 2017-03-09 DIAGNOSIS — I120 Hypertensive chronic kidney disease with stage 5 chronic kidney disease or end stage renal disease: Secondary | ICD-10-CM | POA: Diagnosis present

## 2017-03-09 DIAGNOSIS — I10 Essential (primary) hypertension: Secondary | ICD-10-CM | POA: Diagnosis not present

## 2017-03-09 DIAGNOSIS — Z95828 Presence of other vascular implants and grafts: Secondary | ICD-10-CM

## 2017-03-09 DIAGNOSIS — Z9181 History of falling: Secondary | ICD-10-CM

## 2017-03-09 DIAGNOSIS — Z886 Allergy status to analgesic agent status: Secondary | ICD-10-CM

## 2017-03-09 DIAGNOSIS — Z811 Family history of alcohol abuse and dependence: Secondary | ICD-10-CM

## 2017-03-09 DIAGNOSIS — D62 Acute posthemorrhagic anemia: Secondary | ICD-10-CM | POA: Diagnosis present

## 2017-03-09 DIAGNOSIS — F0280 Dementia in other diseases classified elsewhere without behavioral disturbance: Secondary | ICD-10-CM | POA: Diagnosis present

## 2017-03-09 DIAGNOSIS — R410 Disorientation, unspecified: Secondary | ICD-10-CM | POA: Diagnosis not present

## 2017-03-09 DIAGNOSIS — G3183 Dementia with Lewy bodies: Secondary | ICD-10-CM | POA: Diagnosis present

## 2017-03-09 DIAGNOSIS — I454 Nonspecific intraventricular block: Secondary | ICD-10-CM | POA: Diagnosis present

## 2017-03-09 DIAGNOSIS — M549 Dorsalgia, unspecified: Secondary | ICD-10-CM | POA: Diagnosis present

## 2017-03-09 DIAGNOSIS — L89152 Pressure ulcer of sacral region, stage 2: Secondary | ICD-10-CM

## 2017-03-09 DIAGNOSIS — Z9841 Cataract extraction status, right eye: Secondary | ICD-10-CM

## 2017-03-09 DIAGNOSIS — E8889 Other specified metabolic disorders: Secondary | ICD-10-CM | POA: Diagnosis present

## 2017-03-09 DIAGNOSIS — R7989 Other specified abnormal findings of blood chemistry: Secondary | ICD-10-CM

## 2017-03-09 DIAGNOSIS — Z885 Allergy status to narcotic agent status: Secondary | ICD-10-CM

## 2017-03-09 DIAGNOSIS — R778 Other specified abnormalities of plasma proteins: Secondary | ICD-10-CM

## 2017-03-09 DIAGNOSIS — W19XXXA Unspecified fall, initial encounter: Secondary | ICD-10-CM

## 2017-03-09 DIAGNOSIS — S72002A Fracture of unspecified part of neck of left femur, initial encounter for closed fracture: Secondary | ICD-10-CM | POA: Diagnosis not present

## 2017-03-09 DIAGNOSIS — M1990 Unspecified osteoarthritis, unspecified site: Secondary | ICD-10-CM | POA: Diagnosis present

## 2017-03-09 DIAGNOSIS — Z90710 Acquired absence of both cervix and uterus: Secondary | ICD-10-CM

## 2017-03-09 DIAGNOSIS — Z09 Encounter for follow-up examination after completed treatment for conditions other than malignant neoplasm: Secondary | ICD-10-CM

## 2017-03-09 DIAGNOSIS — Z87442 Personal history of urinary calculi: Secondary | ICD-10-CM

## 2017-03-09 DIAGNOSIS — N189 Chronic kidney disease, unspecified: Secondary | ICD-10-CM

## 2017-03-09 DIAGNOSIS — R748 Abnormal levels of other serum enzymes: Secondary | ICD-10-CM | POA: Diagnosis not present

## 2017-03-09 DIAGNOSIS — Z419 Encounter for procedure for purposes other than remedying health state, unspecified: Secondary | ICD-10-CM

## 2017-03-09 HISTORY — DX: Anemia in chronic kidney disease: D63.1

## 2017-03-09 HISTORY — DX: Chronic kidney disease, unspecified: N18.9

## 2017-03-09 LAB — COMPREHENSIVE METABOLIC PANEL
ALBUMIN: 3.8 g/dL (ref 3.5–5.0)
ALT: 15 U/L (ref 14–54)
ANION GAP: 15 (ref 5–15)
AST: 21 U/L (ref 15–41)
Alkaline Phosphatase: 67 U/L (ref 38–126)
BUN: 67 mg/dL — AB (ref 6–20)
CO2: 14 mmol/L — ABNORMAL LOW (ref 22–32)
Calcium: 9.6 mg/dL (ref 8.9–10.3)
Chloride: 108 mmol/L (ref 101–111)
Creatinine, Ser: 9.24 mg/dL — ABNORMAL HIGH (ref 0.44–1.00)
GFR calc Af Amer: 4 mL/min — ABNORMAL LOW (ref >60)
GFR, EST NON AFRICAN AMERICAN: 3 mL/min — AB (ref >60)
Glucose, Bld: 154 mg/dL — ABNORMAL HIGH (ref 65–99)
POTASSIUM: 5.8 mmol/L — AB (ref 3.5–5.1)
Sodium: 137 mmol/L (ref 135–145)
TOTAL PROTEIN: 7.9 g/dL (ref 6.5–8.1)
Total Bilirubin: 0.7 mg/dL (ref 0.3–1.2)

## 2017-03-09 LAB — CBC WITH DIFFERENTIAL/PLATELET
Basophils Absolute: 0 10*3/uL (ref 0.0–0.1)
Basophils Relative: 0 %
EOS PCT: 0 %
Eosinophils Absolute: 0 10*3/uL (ref 0.0–0.7)
HCT: 36.1 % (ref 36.0–46.0)
Hemoglobin: 11.4 g/dL — ABNORMAL LOW (ref 12.0–15.0)
LYMPHS ABS: 0.5 10*3/uL — AB (ref 0.7–4.0)
Lymphocytes Relative: 5 %
MCH: 24.6 pg — ABNORMAL LOW (ref 26.0–34.0)
MCHC: 31.6 g/dL (ref 30.0–36.0)
MCV: 78 fL (ref 78.0–100.0)
MONO ABS: 0.5 10*3/uL (ref 0.1–1.0)
Monocytes Relative: 5 %
NEUTROS ABS: 9.2 10*3/uL — AB (ref 1.7–7.7)
Neutrophils Relative %: 90 %
PLATELETS: 276 10*3/uL (ref 150–400)
RBC: 4.63 MIL/uL (ref 3.87–5.11)
RDW: 15.6 % — AB (ref 11.5–15.5)
WBC: 10.2 10*3/uL (ref 4.0–10.5)

## 2017-03-09 LAB — ETHANOL: Alcohol, Ethyl (B): <5 mg/dL (ref <5)

## 2017-03-09 LAB — CK: Total CK: 170 U/L (ref 38–234)

## 2017-03-09 LAB — TROPONIN I: TROPONIN I: 1.01 ng/mL — AB (ref <0.03)

## 2017-03-09 LAB — I-STAT TROPONIN, ED: Troponin i, poc: 0.86 ng/mL (ref 0.00–0.08)

## 2017-03-09 MED ORDER — FENTANYL CITRATE (PF) 100 MCG/2ML IJ SOLN
50.0000 ug | Freq: Once | INTRAMUSCULAR | Status: AC
  Administered 2017-03-09: 50 ug via INTRAVENOUS
  Filled 2017-03-09: qty 2

## 2017-03-09 MED ORDER — HEPARIN (PORCINE) IN NACL 100-0.45 UNIT/ML-% IJ SOLN
950.0000 [IU]/h | INTRAMUSCULAR | Status: DC
  Administered 2017-03-09 – 2017-03-10 (×2): 750 [IU]/h via INTRAVENOUS
  Administered 2017-03-12: 950 [IU]/h via INTRAVENOUS
  Filled 2017-03-09 (×3): qty 250

## 2017-03-09 MED ORDER — PANTOPRAZOLE SODIUM 40 MG PO TBEC
40.0000 mg | DELAYED_RELEASE_TABLET | Freq: Every day | ORAL | Status: DC
  Administered 2017-03-10: 40 mg via ORAL
  Filled 2017-03-09: qty 1

## 2017-03-09 MED ORDER — METOPROLOL TARTRATE 25 MG PO TABS
25.0000 mg | ORAL_TABLET | Freq: Two times a day (BID) | ORAL | Status: DC
  Administered 2017-03-09 – 2017-03-19 (×19): 25 mg via ORAL
  Filled 2017-03-09 (×19): qty 1

## 2017-03-09 MED ORDER — ASPIRIN 81 MG PO CHEW
324.0000 mg | CHEWABLE_TABLET | Freq: Once | ORAL | Status: AC
  Administered 2017-03-09: 324 mg via ORAL
  Filled 2017-03-09: qty 4

## 2017-03-09 MED ORDER — NITROGLYCERIN 2 % TD OINT
1.0000 [in_us] | TOPICAL_OINTMENT | Freq: Four times a day (QID) | TRANSDERMAL | Status: DC
Start: 2017-03-09 — End: 2017-03-10
  Administered 2017-03-09 – 2017-03-10 (×2): 1 [in_us] via TOPICAL
  Filled 2017-03-09: qty 30
  Filled 2017-03-09: qty 1

## 2017-03-09 MED ORDER — ONDANSETRON 4 MG PO TBDP
ORAL_TABLET | ORAL | Status: AC
  Filled 2017-03-09: qty 1

## 2017-03-09 MED ORDER — ASPIRIN EC 81 MG PO TBEC
81.0000 mg | DELAYED_RELEASE_TABLET | Freq: Every day | ORAL | Status: DC
  Administered 2017-03-09 – 2017-03-19 (×11): 81 mg via ORAL
  Filled 2017-03-09 (×11): qty 1

## 2017-03-09 MED ORDER — HEPARIN BOLUS VIA INFUSION
3500.0000 [IU] | Freq: Once | INTRAVENOUS | Status: AC
  Administered 2017-03-09: 3500 [IU] via INTRAVENOUS
  Filled 2017-03-09: qty 3500

## 2017-03-09 MED ORDER — ONDANSETRON 4 MG PO TBDP
4.0000 mg | ORAL_TABLET | Freq: Once | ORAL | Status: AC
  Administered 2017-03-09: 4 mg via ORAL

## 2017-03-09 NOTE — ED Provider Notes (Signed)
[facility]-EMERGENCY DEPT Provider Note   CSN: [id number] Arrival date & time: 03/09/17  1248     History   Chief Complaint Chief Complaint  Patient presents with  . Fall    HPI Monica [name] is a 83 y.o. female.  She presents today for evaluation after being found on the floor by her neighbor.  History is obtained by patient and her neighbor.  She reports that she fell early yesterday evening and was unable to get up. She reports that she did not strike anything on the way down and landed hitting the back of her head on the floor.  She reports no LOC, headache, nausea vomiting, chest pain/shortness of breath. She is a ESRD patient on HD Monday Wednesday Friday and reports that she missed dialysis last Friday.  She reports that she did not feel dizzy, lightheaded, short of breath or weak before falling saying that she just doesn't know why she fell.  She is currently complaining of pain in her neck, right shoulder, lower back, and right hip.  She says that the pain in her right hip is bothering her the most. She reports that she is unable to straighten her right leg.  She denies any numbness or tingling anywhere.  She reports that her neck has been hurting for approximately 5 months and that she can't tell if it is worse than normal.    Her neighbor, who acts as a caretaker for her, reports that patient is hallucinating. She reports that patient is continuously trying to give her papers that do not exist. She reports that the patient has no history of hallucinations and is normally "fully with it."  Patient is oriented to person and place but not time stating that she thinks it is "the third" and that it is 1919.        Past Medical History:  Diagnosis Date  . Anxiety   . Arthritis    "all over" (01/29/2017)  . Atrial flutter (HCC)   . Chronic back pain    "all my back" (01/29/2017)  . Constipation   . Dementia   . Depression   . ESRD (end stage renal disease) on dialysis [facility])    "MWF;  Rudene Anda" (01/29/2017)  . ETOHism (HCC)    Quit in the 1980s.  . Gastric ulcer 12/2013.   Prepyloric ulcer. On follow-up EGD in 02/2014 this was 90% healed  . GERD (gastroesophageal reflux disease)   . Headache    as a young woman  . Hemorrhoids   . History of kidney stones   . History of stomach ulcers   . Hypertension   . Pancreatic cyst 09/2010.   At uncinate process.  Serial CT imaging favors benign process    Patient Active Problem List   Diagnosis Date Noted  . Hip fracture (HCC) 03/09/2017  . Steal syndrome as complication of dialysis access (HCC) 01/31/2017  . ESRD on dialysis (HCC) 01/29/2017  . Left sided chest pain 10/28/2016  . Protein-calorie malnutrition, severe 10/25/2016  . Uremia 10/23/2016  . Dementia 10/23/2016  . Anemia, chronic renal failure, stage 5 (HCC) 10/23/2016  . Unintentional weight loss 10/23/2016  . Pancreatic cyst   . Hematuria   . Encounter for nasogastric (NG) tube placement   . SBO (small bowel obstruction)   . Abscess of buttock 12/29/2013  . HTN (hypertension) 12/29/2013  . PAF (paroxysmal atrial fibrillation) (HCC) 12/29/2013  . Melena 12/29/2013  . CKD (chronic kidney disease) stage 5, GFR less  than 15 ml/min (HCC) 12/29/2013  . Escherichia coli urinary tract infection 12/29/2013  . Gastric ulcer with hemorrhage 12/29/2013  . Upper GI bleed 12/28/2013    Past Surgical History:  Procedure Laterality Date  . ABDOMINAL HYSTERECTOMY    . ARTERIOVENOUS GRAFT PLACEMENT Left 01/29/2017   INSERTION OF ARTERIOVENOUS (AV) GORE-TEX GRAFT  STRETCHED IN LEFT FORE ARM   . AV FISTULA PLACEMENT Left 03/20/2015   Procedure: ARTERIOVENOUS (AV) FISTULA CREATION;  Surgeon: Chuck Hint, MD;  Location: [facility] OR;  Service: Vascular;  Laterality: Left;  . AV FISTULA PLACEMENT Left 01/29/2017   Procedure: INSERTION OF ARTERIOVENOUS (AV) GORE-TEX GRAFT  STRETCHED IN LEFT FORE ARM;  Surgeon: Chuck Hint, MD;  Location: [facility] OR;  Service:  Vascular;  Laterality: Left;  . AVGG REMOVAL Left 01/31/2017   Procedure: REMOVAL OF ARTERIOVENOUS GORETEX GRAFT (AVGG) WITH Vein patching.;  Surgeon: Fransisco Hertz, MD;  Location: [facility] OR;  Service: Vascular;  Laterality: Left;  . CATARACT EXTRACTION Right   . COLONOSCOPY  2010   Per Dr. Loreta Ave  . ESOPHAGOGASTRODUODENOSCOPY N/A 12/29/2013   Procedure: ESOPHAGOGASTRODUODENOSCOPY (EGD);  Surgeon: Hart Carwin, MD;  Location: [facility] ENDOSCOPY;  Service: Endoscopy;  Laterality: N/A;  . EXPLORATORY LAPAROTOMY  1980   w/LOA  . FLEXIBLE SIGMOIDOSCOPY  08/2011   Per Dr. Randa Evens. Normal study. Random biopsies positive for trace melanosis coli.  Marland Kitchen FOOT SURGERY Bilateral    "surgery for flat feet"  . INSERTION OF DIALYSIS CATHETER Right 11/25/2016   Procedure: INSERTION OF Right Internal jugular DIALYSIS CATHETER;  Surgeon: Fransisco Hertz, MD;  Location: [facility] OR;  Service: Vascular;  Laterality: Right;  . SHOULDER ARTHROSCOPY W/ ROTATOR CUFF REPAIR Right 08/2003   Hattie Perch 01/29/2017  . TIBIA FRACTURE SURGERY Right    "broke between my knee and my ankle"  . TOTAL ABDOMINAL HYSTERECTOMY W/ BILATERAL SALPINGOOPHORECTOMY  1980  . TUBAL LIGATION      OB History    No data available       Home Medications    Prior to Admission medications   Medication Sig Start Date End Date Taking? Authorizing Provider  acetaminophen (TYLENOL) 500 MG tablet Take 1,000 mg by mouth every 6 (six) hours as needed for mild pain.   Yes Historical Provider, MD  ALPRAZolam Prudy Feeler) 0.5 MG tablet Take 1 tablet (0.5 mg total) by mouth 2 (two) times daily. 01/02/17  Yes Kirt Boys, DO  calcium acetate (PHOSLO) 667 MG tablet Take 667 mg by mouth See admin instructions. Take 2 tablets (1334 mg) by mouth 3 times daily with meals and with snacks   Yes Historical Provider, MD  metoprolol tartrate (LOPRESSOR) 25 MG tablet Take 12.5 mg by mouth 2 (two) times daily.    Yes Historical Provider, MD  nitroGLYCERIN (NITROSTAT) 0.4 MG SL tablet  Place 0.4 mg under the tongue every 5 (five) minutes as needed for chest pain.   Yes Historical Provider, MD  pantoprazole (PROTONIX) 40 MG tablet Take 1 tablet (40 mg total) by mouth daily. 03/21/15  Yes Alyssa A Kennon Rounds, MD  Tetrahydrozoline HCl (VISINE OP) Place 2 drops into both eyes daily as needed (dry eyes).   Yes Historical Provider, MD  venlafaxine XR (EFFEXOR-XR) 37.5 MG 24 hr capsule Take 37.5 mg by mouth at bedtime. 02/11/17  Yes Historical Provider, MD  HYDROcodone-acetaminophen (NORCO/VICODIN) 5-325 MG tablet Take 1 tablet by mouth every 12 (twelve) hours as needed for moderate pain. DNE 3gm of APAP/24hrs Patient not taking: Reported on  03/03/2017 01/30/17   Raymond Gurney, PA-C    Family History Family History  Problem Relation Age of Onset  . Alcohol abuse Mother     Social History Social History  Substance Use Topics  . Smoking status: Former Smoker    Packs/day: 0.50    Years: 32.00    Types: Cigarettes    Quit date: 02/14/1986  . Smokeless tobacco: Never Used  . Alcohol use Yes     Comment: 01/29/2017 "used to have a problem w/it; nothing since 1987"     Allergies   Aspirin and Codeine   Review of Systems Review of Systems  Unable to perform ROS: Mental status change  Respiratory: Negative for chest tightness and shortness of breath.   Cardiovascular: Negative for chest pain, palpitations and leg swelling.  Neurological: Negative for dizziness, light-headedness and numbness.     Physical Exam Updated Vital Signs BP (!) 134/116   Pulse 94   Temp 98.3 F (36.8 C) (Oral)   Resp 19   Wt 60.3 kg   SpO2 98%   BMI 25.12 kg/m   Physical Exam  Constitutional: Vital signs are normal. She appears well-developed and well-nourished.  Non-toxic appearance. She does not have a sickly appearance. She does not appear ill. No distress.  HENT:  Head: Normocephalic and atraumatic. Head is without raccoon's eyes, without Battle's sign, without contusion, without right  periorbital erythema and without left periorbital erythema.  Right Ear: External ear normal.  Left Ear: External ear normal.  Nose: Nose normal.  Mouth/Throat: Uvula is midline, oropharynx is clear and moist and mucous membranes are normal.  Eyes: Conjunctivae and EOM are normal. Pupils are equal, round, and reactive to light.  Neck: Neck supple. No JVD present. No spinous process tenderness and no muscular tenderness present. No tracheal deviation present.  Cardiovascular: Normal rate, regular rhythm, normal heart sounds and intact distal pulses.  Exam reveals no gallop and no friction rub.   No murmur heard. Pulmonary/Chest: Effort normal and breath sounds normal. No stridor. No respiratory distress. She has no wheezes. She has no rales. She exhibits no tenderness.  Abdominal: Soft. Bowel sounds are normal. There is no hepatosplenomegaly. There is no tenderness. There is no guarding.  Musculoskeletal: She exhibits tenderness.       Right shoulder: She exhibits decreased range of motion, tenderness, bony tenderness and pain. She exhibits no deformity, no laceration and normal pulse.       Right hip: She exhibits decreased range of motion, tenderness and bony tenderness. She exhibits no deformity.       Left hip: She exhibits no tenderness and no bony tenderness.       Right ankle: She exhibits normal pulse.       Cervical back: She exhibits pain. She exhibits no tenderness, no bony tenderness and no spasm.       Thoracic back: She exhibits no tenderness, no bony tenderness and no deformity.       Lumbar back: She exhibits no tenderness, no bony tenderness, no deformity and no pain.  Neurological: She is alert.  Skin: Skin is warm and dry. She is not diaphoretic.     Dressing over central line on Left chest is disrupted, pealed up.  Psychiatric: She has a normal mood and affect. Her behavior is normal. She is actively hallucinating.  Nursing note and vitals reviewed.    ED Treatments /  Results  Labs (all labs ordered are listed, but only abnormal results are displayed)  Labs Reviewed  CBC WITH DIFFERENTIAL/PLATELET - Abnormal; Notable for the following:       Result Value   Hemoglobin 11.4 (*)    MCH 24.6 (*)    RDW 15.6 (*)    Neutro Abs 9.2 (*)    Lymphs Abs 0.5 (*)    All other components within normal limits  COMPREHENSIVE METABOLIC PANEL - Abnormal; Notable for the following:    Potassium 5.8 (*)    CO2 14 (*)    Glucose, Bld 154 (*)    BUN 67 (*)    Creatinine, Ser 9.24 (*)    GFR calc non Af Amer 3 (*)    GFR calc Af Amer 4 (*)    All other components within normal limits  I-STAT TROPOININ, ED - Abnormal; Notable for the following:    Troponin i, poc 0.86 (*)    All other components within normal limits  ETHANOL  CK  URINALYSIS, ROUTINE W REFLEX MICROSCOPIC  RAPID URINE DRUG SCREEN, HOSP PERFORMED  TROPONIN I  TROPONIN I  TROPONIN I  HEPARIN LEVEL (UNFRACTIONATED)  CBC  RENAL FUNCTION PANEL  CBG MONITORING, ED    EKG  EKG Interpretation  Date/Time:  Monday March 09 2017 16:10:25 EDT Ventricular Rate:  95 PR Interval:    QRS Duration: 88 QT Interval:  380 QTC Calculation: 478 R Axis:   -27 Text Interpretation:  Sinus rhythm ST elevation and Q waves anteriorly are new comapred to Nov 2017 Confirmed by Criss Alvine MD, SCOTT [phone]) on 03/09/2017 5:12:40 PM       Radiology Dg Chest 1 View  Result Date: 03/09/2017 CLINICAL DATA:  Fall. EXAM: CHEST 1 VIEW COMPARISON:  Radiograph November 25, 2016. FINDINGS: Stable cardiomediastinal silhouette. Left internal jugular dialysis catheter is unchanged in position. No pneumothorax or pleural effusion is noted. No acute pulmonary disease is noted. Bony thorax is unremarkable. IMPRESSION: No acute cardiopulmonary abnormality seen. Electronically Signed   By: Lupita Raider, M.D.   On: 03/09/2017 15:23   Dg Lumbar Spine Complete  Result Date: 03/09/2017 CLINICAL DATA:  Low back pain after fall. EXAM:  LUMBAR SPINE - COMPLETE 4+ VIEW COMPARISON:  CT scan of August 13, 2016. FINDINGS: Diffuse osteopenia is noted. Atherosclerosis thoracic aorta is noted. No fracture or spondylolisthesis is noted. Mild degenerative disc disease is noted at L3-4, L4-5 and L5-S1. IMPRESSION: Mild multilevel degenerative disc disease. Aortic atherosclerosis. No acute abnormality seen in the lumbar spine. Electronically Signed   By: Lupita Raider, M.D.   On: 03/09/2017 15:19   Dg Shoulder Right  Result Date: 03/09/2017 CLINICAL DATA:  Right shoulder pain after fall. EXAM: RIGHT SHOULDER - 2+ VIEW COMPARISON:  None. FINDINGS: There is no evidence of fracture or dislocation. Mild degenerative changes seen involving right glenohumeral joint. Soft tissues are unremarkable. IMPRESSION: Mild degenerative joint disease of right glenohumeral joint. No acute abnormality seen in the right shoulder. Electronically Signed   By: Lupita Raider, M.D.   On: 03/09/2017 15:16   Ct Head Wo Contrast  Result Date: 03/09/2017 CLINICAL DATA:  Fall with altered mental status EXAM: CT HEAD WITHOUT CONTRAST CT CERVICAL SPINE WITHOUT CONTRAST TECHNIQUE: Multidetector CT imaging of the head and cervical spine was performed following the standard protocol without intravenous contrast. Multiplanar CT image reconstructions of the cervical spine were also generated. COMPARISON:  07/18/2011 head CT FINDINGS: CT HEAD FINDINGS Brain: No mass lesion, intraparenchymal hemorrhage or extra-axial collection. No evidence of acute cortical infarct. Ventriculomegaly is unchanged  compared to the prior examination, likely secondary to chronic volume loss. Vascular: No hyperdense vessel or unexpected calcification. Skull: Normal visualized skull base, calvarium and extracranial soft tissues. Sinuses/Orbits: No sinus fluid levels or advanced mucosal thickening. No mastoid effusion. Normal orbits. CT CERVICAL SPINE FINDINGS Alignment: No static subluxation. Facets are  aligned. Occipital condyles are normally positioned. Skull base and vertebrae: No acute fracture. Soft tissues and spinal canal: No prevertebral fluid or swelling. No visible canal hematoma. Disc levels: Multilevel cervical degenerative disc disease without advanced spinal canal or neural foraminal stenosis. Upper chest: No pneumothorax, pulmonary nodule or pleural effusion. Other: Normal visualized paraspinal cervical soft tissues. IMPRESSION: 1. Chronic volume loss, unchanged, without acute intracranial abnormality. 2. No acute fracture or static subluxation of the cervical spine. Electronically Signed   By: Deatra Robinson M.D.   On: 03/09/2017 16:09   Ct Cervical Spine Wo Contrast  Result Date: 03/09/2017 CLINICAL DATA:  Fall with altered mental status EXAM: CT HEAD WITHOUT CONTRAST CT CERVICAL SPINE WITHOUT CONTRAST TECHNIQUE: Multidetector CT imaging of the head and cervical spine was performed following the standard protocol without intravenous contrast. Multiplanar CT image reconstructions of the cervical spine were also generated. COMPARISON:  07/18/2011 head CT FINDINGS: CT HEAD FINDINGS Brain: No mass lesion, intraparenchymal hemorrhage or extra-axial collection. No evidence of acute cortical infarct. Ventriculomegaly is unchanged compared to the prior examination, likely secondary to chronic volume loss. Vascular: No hyperdense vessel or unexpected calcification. Skull: Normal visualized skull base, calvarium and extracranial soft tissues. Sinuses/Orbits: No sinus fluid levels or advanced mucosal thickening. No mastoid effusion. Normal orbits. CT CERVICAL SPINE FINDINGS Alignment: No static subluxation. Facets are aligned. Occipital condyles are normally positioned. Skull base and vertebrae: No acute fracture. Soft tissues and spinal canal: No prevertebral fluid or swelling. No visible canal hematoma. Disc levels: Multilevel cervical degenerative disc disease without advanced spinal canal or neural  foraminal stenosis. Upper chest: No pneumothorax, pulmonary nodule or pleural effusion. Other: Normal visualized paraspinal cervical soft tissues. IMPRESSION: 1. Chronic volume loss, unchanged, without acute intracranial abnormality. 2. No acute fracture or static subluxation of the cervical spine. Electronically Signed   By: Deatra Robinson M.D.   On: 03/09/2017 16:09   Dg Hip Unilat With Pelvis 2-3 Views Right  Result Date: 03/09/2017 CLINICAL DATA:  Right hip pain after fall. EXAM: DG HIP (WITH OR WITHOUT PELVIS) 2-3V RIGHT COMPARISON:  None. FINDINGS: Severely displaced right proximal femoral neck fracture is noted. Femoral head is well situated within the acetabulum. Mild degenerative joint disease is noted bilaterally. IMPRESSION: Severely displaced proximal right femoral neck fracture. Electronically Signed   By: Lupita Raider, M.D.   On: 03/09/2017 15:21    Procedures Procedures  CRITICAL CARE Performed by: Lyndel Safe Total critical care time: 31 minutes Critical care time was exclusive of separately billable procedures and treating other patients. Critical care was necessary to treat or prevent imminent or life-threatening deterioration. Critical care was time spent personally by me on the following activities: development of treatment plan with patient and/or surrogate as well as nursing, discussions with consultants, evaluation of patient's response to treatment, examination of patient, obtaining history from patient or surrogate, ordering and performing treatments and interventions, ordering and review of laboratory studies, ordering and review of radiographic studies, pulse oximetry and re-evaluation of patient's condition.   MI with elevated troponin   Medications Ordered in ED Medications  metoprolol tartrate (LOPRESSOR) tablet 25 mg (not administered)  nitroGLYCERIN (NITROGLYN) 2 % ointment 1 inch (  1 inch Topical Given 03/09/17 2059)  aspirin EC tablet 81 mg (81 mg Oral  Given 03/09/17 2058)  pantoprazole (PROTONIX) EC tablet 40 mg (not administered)  heparin ADULT infusion 100 units/mL (25000 units/258mL sodium chloride 0.45%) (750 Units/hr Intravenous New Bag/Given 03/09/17 2105)  ondansetron (ZOFRAN-ODT) disintegrating tablet 4 mg (4 mg Oral Given 03/09/17 1539)  fentaNYL (SUBLIMAZE) injection 50 mcg (50 mcg Intravenous Given 03/09/17 1631)  aspirin chewable tablet 324 mg (324 mg Oral Given 03/09/17 1625)  heparin bolus via infusion 3,500 Units (3,500 Units Intravenous Bolus from Bag 03/09/17 2106)     Initial Impression / Assessment and Plan / ED Course  I have reviewed the triage vital signs and the nursing notes.  Pertinent labs & imaging results that were available during my care of the patient were reviewed by me and considered in my medical decision making (see chart for details).    Monica [name] presents from home today for an unwitnessed fall that occurred overnight and she was unable to get up. She complained of left hip pain, which was found to have a closed femoral neck fracture.  She denied any prodromal symptoms before falling, denied any chest pain or shortness of breath.  EKG showed concern for STEMI with Q waves present and she had an elevated troponin.  Consults were placed by Dr. Hedwig Morton to Dr. Charlann Boxer from orthopedics and Dr. Sharyn Lull from Cardiology.  Given Q waves present Dr. Sharyn Lull reported no acute intervention needed.  Patient will be admitted by [facility].  Given her exposed central line antibiotic prophylaxis was considered against central line infection but that decision was deferred to the admitting team.    While in the ED her pain was controlled with fentanyl, nausea was controlled with zofran.  For ACS heparin was administered, along with ASA (listed as allergy but "makes my stomach hurt", not likely a true allergy).   The patient appears reasonably stabilized for admission considering the current resources, flow, and capabilities available in the ED at  this time, and I doubt any other [facility] requiring further screening and/or treatment in the ED prior to admission.   The patient was assessed by Dr. Criss Alvine who assisted with plan and assessment.  He agreed with my treatment and plan.   Final Clinical Impressions(s) / ED Diagnoses   Final diagnoses:  Neck pain  Fall  Fall in home, initial encounter  Closed displaced fracture of left femoral neck (HCC)  Elevated troponin    New Prescriptions New Prescriptions   No medications on file     Cristina Gong, Cordelia Poche 03/09/17 2110    Pricilla Loveless, MD 03/10/17 [phone]

## 2017-03-09 NOTE — Progress Notes (Signed)
ANTICOAGULATION CONSULT NOTE - Initial Consult  Pharmacy Consult for Heparin Indication: chest pain/ACS  Allergies  Allergen Reactions  . Aspirin Other (See Comments)    "makes my stomach hurt"  . Codeine Other (See Comments)    "i get irritated"    Patient Measurements: Weight: 132 lb 15 oz (60.3 kg)  Vital Signs: Temp: 98.3 F (36.8 C) (04/02 1625) Temp Source: Oral (04/02 1625) BP: 134/116 (04/02 1930) Pulse Rate: 94 (04/02 1915)  Labs:  Recent Labs  03/09/17 1407  HGB 11.4*  HCT 36.1  PLT 276  CREATININE 9.24*  CKTOTAL 170    Estimated Creatinine Clearance: 3.9 mL/min (A) (by C-G formula based on SCr of 9.24 mg/dL (H)).   Medical History: Past Medical History:  Diagnosis Date  . Anxiety   . Arthritis    "all over" (01/29/2017)  . Atrial flutter (HCC)   . Chronic back pain    "all my back" (01/29/2017)  . Constipation   . Dementia   . Depression   . ESRD (end stage renal disease) on dialysis [facility]    "MWF; Rudene Anda" (01/29/2017)  . ETOHism (HCC)    Quit in the 1980s.  . Gastric ulcer 12/2013.   Prepyloric ulcer. On follow-up EGD in 02/2014 this was 90% healed  . GERD (gastroesophageal reflux disease)   . Headache    as a young woman  . Hemorrhoids   . History of kidney stones   . History of stomach ulcers   . Hypertension   . Pancreatic cyst 09/2010.   At uncinate process.  Serial CT imaging favors benign process    Assessment: 84yof to begin heparin for NSTEMI. She has a hx afib/flutter but is not taking oral anticoagulants. Baseline CBC ok.  Goal of Therapy:  Heparin level 0.3-0.7 units/ml Monitor platelets by anticoagulation protocol: Yes   Plan:  1) Heparin bolus 3500 units x 1 2) Heparin drip at 750 units/hr 3) 8 hour heparin level 4) Daily heparin level and CBC  Fredrik Rigger 03/09/2017,8:25 PM

## 2017-03-09 NOTE — ED Notes (Addendum)
Call neighbor for discharge:  [name]:  [phone],  Pt gave verbal permission for information to be released to Ms. Barr.

## 2017-03-09 NOTE — ED Provider Notes (Signed)
Medical screening examination/treatment/procedure(s) were conducted as a shared visit with non-physician practitioner(s) and myself.  I personally evaluated the patient during the encounter.   EKG Interpretation  Date/Time:  Monday March 09 2017 16:10:25 EDT Ventricular Rate:  95 PR Interval:    QRS Duration: 88 QT Interval:  380 QTC Calculation: 478 R Axis:   -27 Text Interpretation:  Sinus rhythm ST elevation and Q waves anteriorly are new comapred to Nov 2017 Confirmed by Criss Alvine MD, Danelle Curiale [phone]) on 03/09/2017 5:12:40 PM       History limited due to acute confusion. RLE shortened and exertionally rotated. Proximal femoral neck fracture. D/w Dr Charlann Boxer, will see in consult. Unclear why she fell. ECG obtained but after original scans and xrays. Shows ST elevation in anterior leads. No reciprocal changes. No chest pain or dyspnea. She denies CP/dyspnea yesterday during fall but also is confused. Discussed with Harwani, no acute intervention needed, troponin added, appears to already have Q waves, not a code STEMI. FP to admit. Harwani asks for re-consult if needed.    Pricilla Loveless, MD 03/09/17 [phone]

## 2017-03-09 NOTE — Consult Note (Signed)
Reason for Consult: New EKG changes/mildly elevated troponin I Referring Physician EDP  Monica [name] is an 83 y.o. female.  HPI: Patient is 83 year old female with past medical history significant for coronary artery disease, angina pectoris, hypertension, history of paroxysmal A. fib flutter, history of peptic ulcer disease, GERD, end-stage renal disease on hemodialysis, depression, anxiety disorder, dementia, came to the ED because of unwitnessed fall and was noted to have right femoral neck fracture. Cardiologic consultation was called as patient had EKG done in the ED which showed new Q waves in V1 to V3 with evolving anteroseptal wall MI. Patient presently denies any chest pain or shortness of breath. Denies any palpitations lightheadedness or syncopal episode prior to the fall. States has been having nervous feeling in the chest off and on and has been using nitroglycerin daily. Presently denies any chest pain or nervous feeling in the chest. It was noted to have elevated troponin I of 0. 86.  Past Medical History:  Diagnosis Date  . Anxiety   . Arthritis    "all over" (01/29/2017)  . Atrial flutter ([place])   . Chronic back pain    "all my back" (01/29/2017)  . Constipation   . Dementia   . Depression   . ESRD (end stage renal disease) on dialysis [facility])    "MWF; Jeneen Rinks" (01/29/2017)  . ETOHism ([place])    Quit in the 1980s.  . Gastric ulcer 12/2013.   Prepyloric ulcer. On follow-up EGD in 02/2014 this was 90% healed  . GERD (gastroesophageal reflux disease)   . Headache    as a young woman  . Hemorrhoids   . History of kidney stones   . History of stomach ulcers   . Hypertension   . Pancreatic cyst 09/2010.   At uncinate process.  Serial CT imaging favors benign process    Past Surgical History:  Procedure Laterality Date  . ABDOMINAL HYSTERECTOMY    . ARTERIOVENOUS GRAFT PLACEMENT Left 01/29/2017   INSERTION OF ARTERIOVENOUS (AV) GORE-TEX GRAFT  STRETCHED IN LEFT FORE ARM    . AV FISTULA PLACEMENT Left 03/20/2015   Procedure: ARTERIOVENOUS (AV) FISTULA CREATION;  Surgeon: Angelia Mould, MD;  Location: [place];  Service: Vascular;  Laterality: Left;  . AV FISTULA PLACEMENT Left 01/29/2017   Procedure: INSERTION OF ARTERIOVENOUS (AV) GORE-TEX GRAFT  STRETCHED IN LEFT FORE ARM;  Surgeon: Angelia Mould, MD;  Location: [place];  Service: Vascular;  Laterality: Left;  . [place] REMOVAL Left 01/31/2017   Procedure: REMOVAL OF ARTERIOVENOUS GORETEX GRAFT ([place]) WITH Vein patching.;  Surgeon: Conrad Franklin, MD;  Location: [place];  Service: Vascular;  Laterality: Left;  . CATARACT EXTRACTION Right   . COLONOSCOPY  2010   Per Dr. Collene Mares  . ESOPHAGOGASTRODUODENOSCOPY N/A 12/29/2013   Procedure: ESOPHAGOGASTRODUODENOSCOPY (EGD);  Surgeon: Lafayette Dragon, MD;  Location: [facility];  Service: Endoscopy;  Laterality: N/A;  . [place]   w/LOA  . FLEXIBLE SIGMOIDOSCOPY  08/2011   Per Dr. Oletta Lamas. Normal study. Random biopsies positive for trace melanosis coli.  [facility] Kitchen FOOT SURGERY Bilateral    "surgery for flat feet"  . INSERTION OF DIALYSIS CATHETER Right 11/25/2016   Procedure: INSERTION OF Right Internal jugular DIALYSIS CATHETER;  Surgeon: Conrad Seward, MD;  Location: [place];  Service: Vascular;  Laterality: Right;  . SHOULDER ARTHROSCOPY W/ ROTATOR CUFF REPAIR Right 08/2003   Archie Endo 01/29/2017  . TIBIA FRACTURE SURGERY Right    "broke between my  knee and my ankle"  . TOTAL ABDOMINAL HYSTERECTOMY W/ BILATERAL SALPINGOOPHORECTOMY  1980  . TUBAL LIGATION      Family History  Problem Relation Age of Onset  . Alcohol abuse Mother     Social History:  reports that she quit smoking about 31 years ago. Her smoking use included Cigarettes. She has a 16.00 pack-year smoking history. She has never used smokeless tobacco. She reports that she drinks alcohol. She reports that she does not use drugs.  Allergies:  Allergies  Allergen Reactions  . Aspirin Other  (See Comments)    "makes my stomach hurt"  . Codeine Other (See Comments)    "i get irritated"    Medications: I have reviewed the patient's current medications.  Results for orders placed or performed during the hospital encounter of 03/09/17 (from the past 48 hour(s))  CBC with Differential     Status: Abnormal   Collection Time: 03/09/17  2:07 PM  Result Value Ref Range   WBC 10.2 4.0 - 10.5 K/uL   RBC 4.63 3.87 - 5.11 MIL/uL   Hemoglobin 11.4 (L) 12.0 - 15.0 g/dL   HCT 36.1 36.0 - 46.0 %   MCV 78.0 78.0 - 100.0 fL   MCH 24.6 (L) 26.0 - 34.0 pg   MCHC 31.6 30.0 - 36.0 g/dL   RDW 15.6 (H) 11.5 - 15.5 %   Platelets 276 150 - 400 K/uL   Neutrophils Relative % 90 %   Lymphocytes Relative 5 %   Monocytes Relative 5 %   Eosinophils Relative 0 %   Basophils Relative 0 %   Neutro Abs 9.2 (H) 1.7 - 7.7 K/uL   Lymphs Abs 0.5 (L) 0.7 - 4.0 K/uL   Monocytes Absolute 0.5 0.1 - 1.0 K/uL   Eosinophils Absolute 0.0 0.0 - 0.7 K/uL   Basophils Absolute 0.0 0.0 - 0.1 K/uL   RBC Morphology ELLIPTOCYTES     Comment: BURR CELLS  Comprehensive metabolic panel     Status: Abnormal   Collection Time: 03/09/17  2:07 PM  Result Value Ref Range   Sodium 137 135 - 145 mmol/L   Potassium 5.8 (H) 3.5 - 5.1 mmol/L   Chloride 108 101 - 111 mmol/L   CO2 14 (L) 22 - 32 mmol/L   Glucose, Bld 154 (H) 65 - 99 mg/dL   BUN 67 (H) 6 - 20 mg/dL   Creatinine, Ser 9.24 (H) 0.44 - 1.00 mg/dL   Calcium 9.6 8.9 - 10.3 mg/dL   Total Protein 7.9 6.5 - 8.1 g/dL   Albumin 3.8 3.5 - 5.0 g/dL   AST 21 15 - 41 U/L   ALT 15 14 - 54 U/L   Alkaline Phosphatase 67 38 - 126 U/L   Total Bilirubin 0.7 0.3 - 1.2 mg/dL   GFR calc non Af Amer 3 (L) >60 mL/min   GFR calc Af Amer 4 (L) >60 mL/min    Comment: (NOTE) The eGFR has been calculated using the CKD EPI equation. This calculation has not been validated in all clinical situations. eGFR's persistently <60 mL/min signify possible Chronic Kidney Disease.    Anion gap  15 5 - 15  Ethanol     Status: None   Collection Time: 03/09/17  2:07 PM  Result Value Ref Range   Alcohol, Ethyl (B) <5 <5 mg/dL    Comment:        LOWEST DETECTABLE LIMIT FOR SERUM ALCOHOL IS 5 mg/dL FOR MEDICAL PURPOSES ONLY   CK  Status: None   Collection Time: 03/09/17  2:07 PM  Result Value Ref Range   Total CK 170 38 - 234 U/L  I-Stat Troponin, ED (not at [facility])     Status: Abnormal   Collection Time: 03/09/17  5:44 PM  Result Value Ref Range   Troponin i, poc 0.86 (HH) 0.00 - 0.08 ng/mL   Comment NOTIFIED PHYSICIAN    Comment 3            Comment: Due to the release kinetics of cTnI, a negative result within the first hours of the onset of symptoms does not rule out myocardial infarction with certainty. If myocardial infarction is still suspected, repeat the test at appropriate intervals.     Dg Chest 1 View  Result Date: 03/09/2017 CLINICAL DATA:  Fall. EXAM: CHEST 1 VIEW COMPARISON:  Radiograph November 25, 2016. FINDINGS: Stable cardiomediastinal silhouette. Left internal jugular dialysis catheter is unchanged in position. No pneumothorax or pleural effusion is noted. No acute pulmonary disease is noted. Bony thorax is unremarkable. IMPRESSION: No acute cardiopulmonary abnormality seen. Electronically Signed   By: Marijo Conception, M.D.   On: 03/09/2017 15:23   Dg Lumbar Spine Complete  Result Date: 03/09/2017 CLINICAL DATA:  Low back pain after fall. EXAM: LUMBAR SPINE - COMPLETE 4+ VIEW COMPARISON:  CT scan of August 13, 2016. FINDINGS: Diffuse osteopenia is noted. Atherosclerosis thoracic aorta is noted. No fracture or spondylolisthesis is noted. Mild degenerative disc disease is noted at L3-4, L4-5 and L5-S1. IMPRESSION: Mild multilevel degenerative disc disease. Aortic atherosclerosis. No acute abnormality seen in the lumbar spine. Electronically Signed   By: Marijo Conception, M.D.   On: 03/09/2017 15:19   Dg Shoulder Right  Result Date: 03/09/2017 CLINICAL DATA:   Right shoulder pain after fall. EXAM: RIGHT SHOULDER - 2+ VIEW COMPARISON:  None. FINDINGS: There is no evidence of fracture or dislocation. Mild degenerative changes seen involving right glenohumeral joint. Soft tissues are unremarkable. IMPRESSION: Mild degenerative joint disease of right glenohumeral joint. No acute abnormality seen in the right shoulder. Electronically Signed   By: Marijo Conception, M.D.   On: 03/09/2017 15:16   Ct Head Wo Contrast  Result Date: 03/09/2017 CLINICAL DATA:  Fall with altered mental status EXAM: CT HEAD WITHOUT CONTRAST CT CERVICAL SPINE WITHOUT CONTRAST TECHNIQUE: Multidetector CT imaging of the head and cervical spine was performed following the standard protocol without intravenous contrast. Multiplanar CT image reconstructions of the cervical spine were also generated. COMPARISON:  07/18/2011 head CT FINDINGS: CT HEAD FINDINGS Brain: No mass lesion, intraparenchymal hemorrhage or extra-axial collection. No evidence of acute cortical infarct. Ventriculomegaly is unchanged compared to the prior examination, likely secondary to chronic volume loss. Vascular: No hyperdense vessel or unexpected calcification. Skull: Normal visualized skull base, calvarium and extracranial soft tissues. Sinuses/Orbits: No sinus fluid levels or advanced mucosal thickening. No mastoid effusion. Normal orbits. CT CERVICAL SPINE FINDINGS Alignment: No static subluxation. Facets are aligned. Occipital condyles are normally positioned. Skull base and vertebrae: No acute fracture. Soft tissues and spinal canal: No prevertebral fluid or swelling. No visible canal hematoma. Disc levels: Multilevel cervical degenerative disc disease without advanced spinal canal or neural foraminal stenosis. Upper chest: No pneumothorax, pulmonary nodule or pleural effusion. Other: Normal visualized paraspinal cervical soft tissues. IMPRESSION: 1. Chronic volume loss, unchanged, without acute intracranial abnormality. 2. No  acute fracture or static subluxation of the cervical spine. Electronically Signed   By: Ulyses Jarred M.D.   On: 03/09/2017  16:09   Ct Cervical Spine Wo Contrast  Result Date: 03/09/2017 CLINICAL DATA:  Fall with altered mental status EXAM: CT HEAD WITHOUT CONTRAST CT CERVICAL SPINE WITHOUT CONTRAST TECHNIQUE: Multidetector CT imaging of the head and cervical spine was performed following the standard protocol without intravenous contrast. Multiplanar CT image reconstructions of the cervical spine were also generated. COMPARISON:  07/18/2011 head CT FINDINGS: CT HEAD FINDINGS Brain: No mass lesion, intraparenchymal hemorrhage or extra-axial collection. No evidence of acute cortical infarct. Ventriculomegaly is unchanged compared to the prior examination, likely secondary to chronic volume loss. Vascular: No hyperdense vessel or unexpected calcification. Skull: Normal visualized skull base, calvarium and extracranial soft tissues. Sinuses/Orbits: No sinus fluid levels or advanced mucosal thickening. No mastoid effusion. Normal orbits. CT CERVICAL SPINE FINDINGS Alignment: No static subluxation. Facets are aligned. Occipital condyles are normally positioned. Skull base and vertebrae: No acute fracture. Soft tissues and spinal canal: No prevertebral fluid or swelling. No visible canal hematoma. Disc levels: Multilevel cervical degenerative disc disease without advanced spinal canal or neural foraminal stenosis. Upper chest: No pneumothorax, pulmonary nodule or pleural effusion. Other: Normal visualized paraspinal cervical soft tissues. IMPRESSION: 1. Chronic volume loss, unchanged, without acute intracranial abnormality. 2. No acute fracture or static subluxation of the cervical spine. Electronically Signed   By: Ulyses Jarred M.D.   On: 03/09/2017 16:09   Dg Hip Unilat With Pelvis 2-3 Views Right  Result Date: 03/09/2017 CLINICAL DATA:  Right hip pain after fall. EXAM: DG HIP (WITH OR WITHOUT PELVIS) 2-3V RIGHT  COMPARISON:  None. FINDINGS: Severely displaced right proximal femoral neck fracture is noted. Femoral head is well situated within the acetabulum. Mild degenerative joint disease is noted bilaterally. IMPRESSION: Severely displaced proximal right femoral neck fracture. Electronically Signed   By: Marijo Conception, M.D.   On: 03/09/2017 15:21    Review of Systems  Constitutional: Negative for chills, diaphoresis and fever.  Eyes: Negative for double vision.  Respiratory: Negative for cough, sputum production and shortness of breath.   Cardiovascular: Negative for chest pain.  Gastrointestinal: Negative for nausea.  Genitourinary: Positive for frequency.  Neurological: Negative for dizziness and weakness.   Blood pressure (!) 181/96, pulse 96, temperature 98.3 F (36.8 C), temperature source Oral, resp. rate 16, SpO2 98 %. Physical Exam  Constitutional: She is oriented to person, place, and time.  HENT:  Head: Normocephalic and atraumatic.  Eyes: Conjunctivae are normal. Pupils are equal, round, and reactive to light.  Neck: No JVD present. No tracheal deviation present. No thyromegaly present.  Cardiovascular: Normal rate and regular rhythm.   Murmur (Soft systolic murmur noted no S3 gallop) heard. Respiratory:  Clear to auscultation anterolaterally  GI: Soft. Bowel sounds are normal. She exhibits no distension. There is no tenderness. There is no rebound.  Musculoskeletal:  No clubbing cyanosis or edema right leg shortening and external rotation noted  Neurological: She is alert and oriented to person, place, and time.    Assessment/Plan: Probable recent anteroseptal wall myocardial infarction Status post fall sustaining right femoral neck fracture Hypertension End-stage renal disease on hemodialysis History of paroxysmal A. fib flutter History of peptic ulcer disease GERD Anemia of chronic disease Elevated blood sugar rule out diabetes mellitus Depression Anxiety  disorder Dementia Plan Start aspirin 81 mg daily patient states she has taken in the past and has intolerance. Heparin per pharmacy Check serial enzymes and EKG Check 2-D echo to check LV systolic function Increase Lopressor to 50 mg twice daily Add Nitropaste  one-inch every 6 hours We'll consider adding ACE inhibitor once hyperkalemia has resolved and echo results available Patient had is for any surgical intervention in view of recent MI and multiple comorbidities. Charolette Forward 03/09/2017, 7:34 PM

## 2017-03-09 NOTE — ED Notes (Signed)
PAGED [facility] TO [place]

## 2017-03-09 NOTE — H&P (Signed)
Family Medicine Teaching [facility] Admission History and Physical Service Pager: [phone]  Patient name: Monica [name] Medical record number: [id number] Date of birth: March 15, 1932 Age: 83 y.o. Gender: female  Primary Care Provider: No PCP Per Patient Consultants: Cardiology Code Status: Full  Chief Complaint: AMS and right hip pain  Assessment and Plan: Monica [name] is a 83 y.o. female presenting with AMS and right hip pain . PMH is significant for HTN, ESRD with HD MWF, Anxiety, Dementia  Acute toxic metabolic encephalopathy: Admission vitals include normal heart rate and respiratory rate however patient's is hypertensive to 179/97. Breathing room air with oxygen saturation 99%. Notable labs include a potassium of 5.8, bicarbonate of 14 and a creatinine of 9.24; patient is known ESRD on hemodialysis. No leukocytosis. Hemoglobin stable at 11.4. No hypoglycemia as glucose is 154. Alcohol undetectable. EKG showing new Q waves in V1 to V3 as well as signs of possible ST elevation in anterior leads. Troponin elevated to 0.86. CT of right shoulder, cervical spine, lumbar spine, and chest showing no acute abnormalities. Hip x-ray showing right femoral neck fracture (see below assessment and plan).  Altered mental status likely multifactorial given signs of STEMI as well as the right hip fracture as well as underlying dementia. -Admit to family practice teaching service, attending Dr. McDiarmid, telemetry -Vital signs per floor protocol -Check TSH -Cardiology consulted, appreciate assistance  -orthopedics consulted, appreciate assistance -We'll continue to monitor mental status closely -check magnesium and phosphorus -A.m. CBC and renal function panel  Right Femoral Neck Fracture: Likely from mechanical fall but unclear what happened as fall was unwitnessed. Patient has altered mental status and was discovered by neighbor on the morning of April 2. It is possible that patient fell secondary to  STEMI (see other assessment and plan). Right hip XRAY showing severely displaced right femoral neck fracture. -Orthopedics consulted, appreciate assistance -Tylenol 650 mg every 6 when necessary for mild pain and fentanyl 50 g every 2 when necessary for severe pain -NPO for possible surgery  Concerns for STEMI: EKG showing new Q waves in V1 to V3 as well as signs of possible ST elevation in anterior leads. Troponin elevated to 0.86. Cardiology recommending heparin per pharmacy and increasing Lopressor to 50 mg twice a day. -Telemetry -Cardiology consulted, appreciate recommendations -Aspirin -Trend troponins -Echo  -heparin per pharmacy -Lopressor 50 mg twice a day  ESRD; HD on MWF: S/p left forearm graft placement on 01/29/2017 by Dr. Edilia Bo. She developed a steal syndrome and on 2/24 she underwent removal of the forearm graft with vein patch angioplasty of the brachial artery. Patient now has a Left internal jugular dialysis catheter. Missed her Friday and Monday HD. - Continue home phoslo - Consult nephro in AM - Daily renal function panel  Dementia: Baseline dementia per Epic review as well as per discussion with her neighbor. Has some hallucinations as well as some lip tremor and hand and feet tremors,?Lewy body dementia - Continue to monitor mental status  Depression and Anxiety:  - Continue home Effexor and Xanax  FEN/GI: SLIV, nothing by mouth for now Prophylaxis: Heparin  Disposition: Admit to family practice teaching service, telemetry floor, attending Dr. McDiarmid  History of Present Illness:  Monica [name] is a 83 y.o. female presenting with AMS and right hip pain.  Patient unable to provide accurate history due to altered mental status. History was obtained by patient's neighbor [name].  Patient's neighbor notes that patient missed dialysis on Friday because "she didn't  feel like going". Saturday the patient was noted to be well-appearing. Sunday night patient's  neighbor notes she was laying on the couch in the dark and stated she "didn't feel good". The neighbor notes that she went to check on her this morning and patient was laying on the bedroom floor in her own urine. Patient's neighbor notes that at baseline the patient has dementia but she has been worse today in terms of being "loony". Neighbor notes that she has been saying "weird things" such as there are pocket books and ice cream hanging from the ceiling. She noted that her children are dead but they are actually alive. EMS was called once the patient's neighbor found her on the ground and patient was taken to the ED.   Neighbor notes that since December 4th she started dialysis. At baseline she has urinary incontinence and wears an adult diaper. She has a "shuffling gait" but refuses to use her walker. Patient lives alone but her neighbor checks on her every day. Patient has a nurse who comes about once a week to check on her.   Patient denies any chest pain, shortness of breath, headache, fever, chills.   Review Of Systems: Per HPI with the following additions: see HPI  ROS  Patient Active Problem List   Diagnosis Date Noted  . Steal syndrome as complication of dialysis access (HCC) 01/31/2017  . ESRD on dialysis (HCC) 01/29/2017  . Left sided chest pain 10/28/2016  . Protein-calorie malnutrition, severe 10/25/2016  . Uremia 10/23/2016  . Dementia 10/23/2016  . Anemia, chronic renal failure, stage 5 (HCC) 10/23/2016  . Unintentional weight loss 10/23/2016  . Pancreatic cyst   . Hematuria   . Encounter for nasogastric (NG) tube placement   . SBO (small bowel obstruction)   . Abscess of buttock 12/29/2013  . HTN (hypertension) 12/29/2013  . PAF (paroxysmal atrial fibrillation) (HCC) 12/29/2013  . Melena 12/29/2013  . CKD (chronic kidney disease) stage 5, GFR less than 15 ml/min (HCC) 12/29/2013  . Escherichia coli urinary tract infection 12/29/2013  . Gastric ulcer with hemorrhage  12/29/2013  . Upper GI bleed 12/28/2013    Past Medical History: Past Medical History:  Diagnosis Date  . Anxiety   . Arthritis    "all over" (01/29/2017)  . Atrial flutter (HCC)   . Chronic back pain    "all my back" (01/29/2017)  . Constipation   . Dementia   . Depression   . ESRD (end stage renal disease) on dialysis [facility])    "MWF; [facility]" (01/29/2017)  . ETOHism (HCC)    Quit in the 1980s.  . Gastric ulcer 12/2013.   Prepyloric ulcer. On follow-up EGD in 02/2014 this was 90% healed  . GERD (gastroesophageal reflux disease)   . Headache    as a young woman  . Hemorrhoids   . History of kidney stones   . History of stomach ulcers   . Hypertension   . Pancreatic cyst 09/2010.   At uncinate process.  Serial CT imaging favors benign process    Past Surgical History: Past Surgical History:  Procedure Laterality Date  . ABDOMINAL HYSTERECTOMY    . ARTERIOVENOUS GRAFT PLACEMENT Left 01/29/2017   INSERTION OF ARTERIOVENOUS (AV) GORE-TEX GRAFT  STRETCHED IN LEFT FORE ARM   . AV FISTULA PLACEMENT Left 03/20/2015   Procedure: ARTERIOVENOUS (AV) FISTULA CREATION;  Surgeon: Chuck Hint, MD;  Location[facility] OR;  Service: Vascular;  Laterality: Left;  . AV FISTULA PLACEMENT Left  01/29/2017   Procedure: INSERTION OF ARTERIOVENOUS (AV) GORE-TEX GRAFT  STRETCHED IN LEFT FORE ARM;  Surgeon: Chuck Hint, MD;  Location: [facility] OR;  Service: Vascular;  Laterality: Left;  . AVGG REMOVAL Left 01/31/2017   Procedure: REMOVAL OF ARTERIOVENOUS GORETEX GRAFT (AVGG) WITH Vein patching.;  Surgeon: Fransisco Hertz, MD;  Location: [facility] OR;  Service: Vascular;  Laterality: Left;  . CATARACT EXTRACTION Right   . COLONOSCOPY  2010   Per Dr. Loreta Ave  . ESOPHAGOGASTRODUODENOSCOPY N/A 12/29/2013   Procedure: ESOPHAGOGASTRODUODENOSCOPY (EGD);  Surgeon: Hart Carwin, MD;  Location: [facility] ENDOSCOPY;  Service: Endoscopy;  Laterality: N/A;  . EXPLORATORY LAPAROTOMY  1980   w/LOA  . FLEXIBLE  SIGMOIDOSCOPY  08/2011   Per Dr. Randa Evens. Normal study. Random biopsies positive for trace melanosis coli.  Marland Kitchen FOOT SURGERY Bilateral    "surgery for flat feet"  . INSERTION OF DIALYSIS CATHETER Right 11/25/2016   Procedure: INSERTION OF Right Internal jugular DIALYSIS CATHETER;  Surgeon: Fransisco Hertz, MD;  Location: [facility] OR;  Service: Vascular;  Laterality: Right;  . SHOULDER ARTHROSCOPY W/ ROTATOR CUFF REPAIR Right 08/2003   Hattie Perch 01/29/2017  . TIBIA FRACTURE SURGERY Right    "broke between my knee and my ankle"  . TOTAL ABDOMINAL HYSTERECTOMY W/ BILATERAL SALPINGOOPHORECTOMY  1980  . TUBAL LIGATION      Social History: Social History  Substance Use Topics  . Smoking status: Former Smoker    Packs/day: 0.50    Years: 32.00    Types: Cigarettes    Quit date: 02/14/1986  . Smokeless tobacco: Never Used  . Alcohol use Yes     Comment: 01/29/2017 "used to have a problem w/it; nothing since 1987"   Additional social history: Lives alone, neighbor checks on her daily  Please also refer to relevant sections of EMR.  Family History: Family History  Problem Relation Age of Onset  . Alcohol abuse Mother    Allergies and Medications: Allergies  Allergen Reactions  . Aspirin Other (See Comments)    "makes my stomach hurt"  . Codeine Other (See Comments)    "i get irritated"   No current facility-administered medications on file prior to encounter.    Current Outpatient Prescriptions on File Prior to Encounter  Medication Sig Dispense Refill  . acetaminophen (TYLENOL) 500 MG tablet Take 1,000 mg by mouth every 6 (six) hours as needed for mild pain.    Marland Kitchen ALPRAZolam (XANAX) 0.5 MG tablet Take 1 tablet (0.5 mg total) by mouth 2 (two) times daily. 60 tablet 0  . metoprolol tartrate (LOPRESSOR) 25 MG tablet Take 12.5 mg by mouth 2 (two) times daily.     . nitroGLYCERIN (NITROSTAT) 0.4 MG SL tablet Place 0.4 mg under the tongue every 5 (five) minutes as needed for chest pain.    .  pantoprazole (PROTONIX) 40 MG tablet Take 1 tablet (40 mg total) by mouth daily. 30 tablet 3  . Tetrahydrozoline HCl (VISINE OP) Place 2 drops into both eyes daily as needed (dry eyes).    . venlafaxine XR (EFFEXOR-XR) 37.5 MG 24 hr capsule Take 37.5 mg by mouth at bedtime.  0  . HYDROcodone-acetaminophen (NORCO/VICODIN) 5-325 MG tablet Take 1 tablet by mouth every 12 (twelve) hours as needed for moderate pain. DNE 3gm of APAP/24hrs (Patient not taking: Reported on 03/03/2017) 30 tablet 0    Objective: BP (!) 181/96   Pulse 96   Temp 98.3 F (36.8 C) (Oral)   Resp 16  SpO2 98%    Exam: General: Elderly female laying in bed in no acute distress Eyes: Pupils equal and reactive to light, nonicteric sclera ENTM: Slightly tacky mucous membranes, no nasal discharge, no pharyngeal erythema, missing some teeth Neck: No lymphadenopathy Cardiovascular: Regular rate and rhythm, no edema, no murmur appreciated Respiratory: Normal work of breathing, clear to auscultation bilaterally however exam is limited as patient cannot sit up Gastrointestinal: Soft, nondistended, nontender, normal bowel sounds MSK: Moves upper extremities spontaneously, able to wiggle toes but unable to lift legs off the bed. Right hip tenderness to palpation. Right leg appears slightly shorter and slightly externally rotated. Left hip nontender.  Derm: No rashes. Bandage over line On left anterior chest. Neuro: Patient can state her name, location, and the president. States the year is 67. Tremor and noted in lips as well as resting tremor in bilateral hands and feet Psych: Normal mood and affect, does have occasional visual hallucinations  Labs and Imaging: CBC BMET   Recent Labs Lab 03/09/17 1407  WBC 10.2  HGB 11.4*  HCT 36.1  PLT 276    Recent Labs Lab 03/09/17 1407  NA 137  K 5.8*  CL 108  CO2 14*  BUN 67*  CREATININE 9.24*  GLUCOSE 154*  CALCIUM 9.6     Dg Chest 1 View  Result Date:  03/09/2017 CLINICAL DATA:  Fall. EXAM: CHEST 1 VIEW COMPARISON:  Radiograph November 25, 2016. FINDINGS: Stable cardiomediastinal silhouette. Left internal jugular dialysis catheter is unchanged in position. No pneumothorax or pleural effusion is noted. No acute pulmonary disease is noted. Bony thorax is unremarkable. IMPRESSION: No acute cardiopulmonary abnormality seen. Electronically Signed   By: Lupita Raider, M.D.   On: 03/09/2017 15:23   Dg Lumbar Spine Complete  Result Date: 03/09/2017 CLINICAL DATA:  Low back pain after fall. EXAM: LUMBAR SPINE - COMPLETE 4+ VIEW COMPARISON:  CT scan of August 13, 2016. FINDINGS: Diffuse osteopenia is noted. Atherosclerosis thoracic aorta is noted. No fracture or spondylolisthesis is noted. Mild degenerative disc disease is noted at L3-4, L4-5 and L5-S1. IMPRESSION: Mild multilevel degenerative disc disease. Aortic atherosclerosis. No acute abnormality seen in the lumbar spine. Electronically Signed   By: Lupita Raider, M.D.   On: 03/09/2017 15:19   Dg Shoulder Right  Result Date: 03/09/2017 CLINICAL DATA:  Right shoulder pain after fall. EXAM: RIGHT SHOULDER - 2+ VIEW COMPARISON:  None. FINDINGS: There is no evidence of fracture or dislocation. Mild degenerative changes seen involving right glenohumeral joint. Soft tissues are unremarkable. IMPRESSION: Mild degenerative joint disease of right glenohumeral joint. No acute abnormality seen in the right shoulder. Electronically Signed   By: Lupita Raider, M.D.   On: 03/09/2017 15:16   Ct Head Wo Contrast  Result Date: 03/09/2017 CLINICAL DATA:  Fall with altered mental status EXAM: CT HEAD WITHOUT CONTRAST CT CERVICAL SPINE WITHOUT CONTRAST TECHNIQUE: Multidetector CT imaging of the head and cervical spine was performed following the standard protocol without intravenous contrast. Multiplanar CT image reconstructions of the cervical spine were also generated. COMPARISON:  07/18/2011 head CT FINDINGS: CT HEAD  FINDINGS Brain: No mass lesion, intraparenchymal hemorrhage or extra-axial collection. No evidence of acute cortical infarct. Ventriculomegaly is unchanged compared to the prior examination, likely secondary to chronic volume loss. Vascular: No hyperdense vessel or unexpected calcification. Skull: Normal visualized skull base, calvarium and extracranial soft tissues. Sinuses/Orbits: No sinus fluid levels or advanced mucosal thickening. No mastoid effusion. Normal orbits. CT CERVICAL SPINE FINDINGS Alignment:  No static subluxation. Facets are aligned. Occipital condyles are normally positioned. Skull base and vertebrae: No acute fracture. Soft tissues and spinal canal: No prevertebral fluid or swelling. No visible canal hematoma. Disc levels: Multilevel cervical degenerative disc disease without advanced spinal canal or neural foraminal stenosis. Upper chest: No pneumothorax, pulmonary nodule or pleural effusion. Other: Normal visualized paraspinal cervical soft tissues. IMPRESSION: 1. Chronic volume loss, unchanged, without acute intracranial abnormality. 2. No acute fracture or static subluxation of the cervical spine. Electronically Signed   By: Deatra Robinson M.D.   On: 03/09/2017 16:09   Ct Cervical Spine Wo Contrast  Result Date: 03/09/2017 CLINICAL DATA:  Fall with altered mental status EXAM: CT HEAD WITHOUT CONTRAST CT CERVICAL SPINE WITHOUT CONTRAST TECHNIQUE: Multidetector CT imaging of the head and cervical spine was performed following the standard protocol without intravenous contrast. Multiplanar CT image reconstructions of the cervical spine were also generated. COMPARISON:  07/18/2011 head CT FINDINGS: CT HEAD FINDINGS Brain: No mass lesion, intraparenchymal hemorrhage or extra-axial collection. No evidence of acute cortical infarct. Ventriculomegaly is unchanged compared to the prior examination, likely secondary to chronic volume loss. Vascular: No hyperdense vessel or unexpected calcification.  Skull: Normal visualized skull base, calvarium and extracranial soft tissues. Sinuses/Orbits: No sinus fluid levels or advanced mucosal thickening. No mastoid effusion. Normal orbits. CT CERVICAL SPINE FINDINGS Alignment: No static subluxation. Facets are aligned. Occipital condyles are normally positioned. Skull base and vertebrae: No acute fracture. Soft tissues and spinal canal: No prevertebral fluid or swelling. No visible canal hematoma. Disc levels: Multilevel cervical degenerative disc disease without advanced spinal canal or neural foraminal stenosis. Upper chest: No pneumothorax, pulmonary nodule or pleural effusion. Other: Normal visualized paraspinal cervical soft tissues. IMPRESSION: 1. Chronic volume loss, unchanged, without acute intracranial abnormality. 2. No acute fracture or static subluxation of the cervical spine. Electronically Signed   By: Deatra Robinson M.D.   On: 03/09/2017 16:09   Dg Hip Unilat With Pelvis 2-3 Views Right  Result Date: 03/09/2017 CLINICAL DATA:  Right hip pain after fall. EXAM: DG HIP (WITH OR WITHOUT PELVIS) 2-3V RIGHT COMPARISON:  None. FINDINGS: Severely displaced right proximal femoral neck fracture is noted. Femoral head is well situated within the acetabulum. Mild degenerative joint disease is noted bilaterally. IMPRESSION: Severely displaced proximal right femoral neck fracture. Electronically Signed   By: Lupita Raider, M.D.   On: 03/09/2017 15:21   Troponin (Point of Care Test)  Recent Labs  03/09/17 1744  TROPIPOC 0.86*   Alcohol Level    Component Value Date/Time   [facility] <5 03/09/2017 1407    Beaulah Dinning, MD 03/09/2017, 6:52 PM PGY-2,  Family Medicine FPTS Intern pager: [phone], text pages welcome

## 2017-03-09 NOTE — ED Notes (Signed)
Critical Troponin called from lab  Dr; Jonathon Jordan made aware

## 2017-03-09 NOTE — ED Notes (Signed)
Attempted report 

## 2017-03-09 NOTE — ED Notes (Signed)
Transported to CT 

## 2017-03-09 NOTE — ED Triage Notes (Signed)
Pt presents from home with report of unwitnessed fall at undetermined time.  Neighbor found pt at noon today, to take pt to dialysis - missed HD on Friday.  Pt able to ambulate with walker.

## 2017-03-09 NOTE — Consult Note (Addendum)
Reason for Consult:  Right hip fracture Referring Physician: ER Physician  Monica [name] is an 83 y.o. female.  HPI: 83 yo female who lives independently had ground level fall.  Brought to ER by EMS and neighbor friend.  No other complaints other than right hip pain    Past Medical History:  Diagnosis Date  . Anxiety   . Arthritis    "all over" (01/29/2017)  . Atrial flutter ([place])   . Chronic back pain    "all my back" (01/29/2017)  . Constipation   . Dementia   . Depression   . ESRD (end stage renal disease) on dialysis [facility])    "MWF; Jeneen Rinks" (01/29/2017)  . ETOHism ([place])    Quit in the 1980s.  . Gastric ulcer 12/2013.   Prepyloric ulcer. On follow-up EGD in 02/2014 this was 90% healed  . GERD (gastroesophageal reflux disease)   . Headache    as a young woman  . Hemorrhoids   . History of kidney stones   . History of stomach ulcers   . Hypertension   . Pancreatic cyst 09/2010.   At uncinate process.  Serial CT imaging favors benign process    Past Surgical History:  Procedure Laterality Date  . ABDOMINAL HYSTERECTOMY    . ARTERIOVENOUS GRAFT PLACEMENT Left 01/29/2017   INSERTION OF ARTERIOVENOUS (AV) GORE-TEX GRAFT  STRETCHED IN LEFT FORE ARM   . AV FISTULA PLACEMENT Left 03/20/2015   Procedure: ARTERIOVENOUS (AV) FISTULA CREATION;  Surgeon: Angelia Mould, MD;  Location: [place];  Service: Vascular;  Laterality: Left;  . AV FISTULA PLACEMENT Left 01/29/2017   Procedure: INSERTION OF ARTERIOVENOUS (AV) GORE-TEX GRAFT  STRETCHED IN LEFT FORE ARM;  Surgeon: Angelia Mould, MD;  Location: [place];  Service: Vascular;  Laterality: Left;  . [place] REMOVAL Left 01/31/2017   Procedure: REMOVAL OF ARTERIOVENOUS GORETEX GRAFT ([place]) WITH Vein patching.;  Surgeon: Conrad Bowdon, MD;  Location: [place];  Service: Vascular;  Laterality: Left;  . CATARACT EXTRACTION Right   . COLONOSCOPY  2010   Per Dr. Collene Mares  . ESOPHAGOGASTRODUODENOSCOPY N/A 12/29/2013   Procedure:  ESOPHAGOGASTRODUODENOSCOPY (EGD);  Surgeon: Lafayette Dragon, MD;  Location: [facility] ENDOSCOPY;  Service: Endoscopy;  Laterality: N/A;  . [place]   w/LOA  . FLEXIBLE SIGMOIDOSCOPY  08/2011   Per Dr. Oletta Lamas. Normal study. Random biopsies positive for trace melanosis coli.  Marland Kitchen FOOT SURGERY Bilateral    "surgery for flat feet"  . INSERTION OF DIALYSIS CATHETER Right 11/25/2016   Procedure: INSERTION OF Right Internal jugular DIALYSIS CATHETER;  Surgeon: Conrad [place] Hills, MD;  Location: [place];  Service: Vascular;  Laterality: Right;  . SHOULDER ARTHROSCOPY W/ ROTATOR CUFF REPAIR Right 08/2003   Archie Endo 01/29/2017  . TIBIA FRACTURE SURGERY Right    "broke between my knee and my ankle"  . TOTAL ABDOMINAL HYSTERECTOMY W/ BILATERAL SALPINGOOPHORECTOMY  1980  . TUBAL LIGATION      Family History  Problem Relation Age of Onset  . Alcohol abuse Mother     Social History:  reports that she quit smoking about 31 years ago. Her smoking use included Cigarettes. She has a 16.00 pack-year smoking history. She has never used smokeless tobacco. She reports that she drinks alcohol. She reports that she does not use drugs.  Allergies:  Allergies  Allergen Reactions  . Aspirin Other (See Comments)    "makes my stomach hurt"  . Codeine Other (See Comments)    "  i get irritated"      Results for orders placed or performed during the hospital encounter of 03/09/17 (from the past 48 hour(s))  CBC with Differential     Status: Abnormal   Collection Time: 03/09/17  2:07 PM  Result Value Ref Range   WBC 10.2 4.0 - 10.5 K/uL   RBC 4.63 3.87 - 5.11 MIL/uL   Hemoglobin 11.4 (L) 12.0 - 15.0 g/dL   HCT 36.1 36.0 - 46.0 %   MCV 78.0 78.0 - 100.0 fL   MCH 24.6 (L) 26.0 - 34.0 pg   MCHC 31.6 30.0 - 36.0 g/dL   RDW 15.6 (H) 11.5 - 15.5 %   Platelets 276 150 - 400 K/uL   Neutrophils Relative % 90 %   Lymphocytes Relative 5 %   Monocytes Relative 5 %   Eosinophils Relative 0 %   Basophils Relative 0  %   Neutro Abs 9.2 (H) 1.7 - 7.7 K/uL   Lymphs Abs 0.5 (L) 0.7 - 4.0 K/uL   Monocytes Absolute 0.5 0.1 - 1.0 K/uL   Eosinophils Absolute 0.0 0.0 - 0.7 K/uL   Basophils Absolute 0.0 0.0 - 0.1 K/uL   RBC Morphology ELLIPTOCYTES     Comment: BURR CELLS  Comprehensive metabolic panel     Status: Abnormal   Collection Time: 03/09/17  2:07 PM  Result Value Ref Range   Sodium 137 135 - 145 mmol/L   Potassium 5.8 (H) 3.5 - 5.1 mmol/L   Chloride 108 101 - 111 mmol/L   CO2 14 (L) 22 - 32 mmol/L   Glucose, Bld 154 (H) 65 - 99 mg/dL   BUN 67 (H) 6 - 20 mg/dL   Creatinine, Ser 9.24 (H) 0.44 - 1.00 mg/dL   Calcium 9.6 8.9 - 10.3 mg/dL   Total Protein 7.9 6.5 - 8.1 g/dL   Albumin 3.8 3.5 - 5.0 g/dL   AST 21 15 - 41 U/L   ALT 15 14 - 54 U/L   Alkaline Phosphatase 67 38 - 126 U/L   Total Bilirubin 0.7 0.3 - 1.2 mg/dL   GFR calc non Af Amer 3 (L) >60 mL/min   GFR calc Af Amer 4 (L) >60 mL/min    Comment: (NOTE) The eGFR has been calculated using the CKD EPI equation. This calculation has not been validated in all clinical situations. eGFR's persistently <60 mL/min signify possible Chronic Kidney Disease.    Anion gap 15 5 - 15  Ethanol     Status: None   Collection Time: 03/09/17  2:07 PM  Result Value Ref Range   Alcohol, Ethyl (B) <5 <5 mg/dL    Comment:        LOWEST DETECTABLE LIMIT FOR SERUM ALCOHOL IS 5 mg/dL FOR MEDICAL PURPOSES ONLY   CK     Status: None   Collection Time: 03/09/17  2:07 PM  Result Value Ref Range   Total CK 170 38 - 234 U/L  I-Stat Troponin, ED (not at [facility])     Status: Abnormal   Collection Time: 03/09/17  5:44 PM  Result Value Ref Range   Troponin i, poc 0.86 (HH) 0.00 - 0.08 ng/mL   Comment NOTIFIED PHYSICIAN    Comment 3            Comment: Due to the release kinetics of cTnI, a negative result within the first hours of the onset of symptoms does not rule out myocardial infarction with certainty. If myocardial infarction is still suspected, repeat  the test at appropriate intervals.     Dg Chest 1 View  Result Date: 03/09/2017 CLINICAL DATA:  Fall. EXAM: CHEST 1 VIEW COMPARISON:  Radiograph November 25, 2016. FINDINGS: Stable cardiomediastinal silhouette. Left internal jugular dialysis catheter is unchanged in position. No pneumothorax or pleural effusion is noted. No acute pulmonary disease is noted. Bony thorax is unremarkable. IMPRESSION: No acute cardiopulmonary abnormality seen. Electronically Signed   By: Marijo Conception, M.D.   On: 03/09/2017 15:23   Dg Lumbar Spine Complete  Result Date: 03/09/2017 CLINICAL DATA:  Low back pain after fall. EXAM: LUMBAR SPINE - COMPLETE 4+ VIEW COMPARISON:  CT scan of August 13, 2016. FINDINGS: Diffuse osteopenia is noted. Atherosclerosis thoracic aorta is noted. No fracture or spondylolisthesis is noted. Mild degenerative disc disease is noted at L3-4, L4-5 and L5-S1. IMPRESSION: Mild multilevel degenerative disc disease. Aortic atherosclerosis. No acute abnormality seen in the lumbar spine. Electronically Signed   By: Marijo Conception, M.D.   On: 03/09/2017 15:19   Dg Shoulder Right  Result Date: 03/09/2017 CLINICAL DATA:  Right shoulder pain after fall. EXAM: RIGHT SHOULDER - 2+ VIEW COMPARISON:  None. FINDINGS: There is no evidence of fracture or dislocation. Mild degenerative changes seen involving right glenohumeral joint. Soft tissues are unremarkable. IMPRESSION: Mild degenerative joint disease of right glenohumeral joint. No acute abnormality seen in the right shoulder. Electronically Signed   By: Marijo Conception, M.D.   On: 03/09/2017 15:16   Ct Head Wo Contrast  Result Date: 03/09/2017 CLINICAL DATA:  Fall with altered mental status EXAM: CT HEAD WITHOUT CONTRAST CT CERVICAL SPINE WITHOUT CONTRAST TECHNIQUE: Multidetector CT imaging of the head and cervical spine was performed following the standard protocol without intravenous contrast. Multiplanar CT image reconstructions of the cervical  spine were also generated. COMPARISON:  07/18/2011 head CT FINDINGS: CT HEAD FINDINGS Brain: No mass lesion, intraparenchymal hemorrhage or extra-axial collection. No evidence of acute cortical infarct. Ventriculomegaly is unchanged compared to the prior examination, likely secondary to chronic volume loss. Vascular: No hyperdense vessel or unexpected calcification. Skull: Normal visualized skull base, calvarium and extracranial soft tissues. Sinuses/Orbits: No sinus fluid levels or advanced mucosal thickening. No mastoid effusion. Normal orbits. CT CERVICAL SPINE FINDINGS Alignment: No static subluxation. Facets are aligned. Occipital condyles are normally positioned. Skull base and vertebrae: No acute fracture. Soft tissues and spinal canal: No prevertebral fluid or swelling. No visible canal hematoma. Disc levels: Multilevel cervical degenerative disc disease without advanced spinal canal or neural foraminal stenosis. Upper chest: No pneumothorax, pulmonary nodule or pleural effusion. Other: Normal visualized paraspinal cervical soft tissues. IMPRESSION: 1. Chronic volume loss, unchanged, without acute intracranial abnormality. 2. No acute fracture or static subluxation of the cervical spine. Electronically Signed   By: Ulyses Jarred M.D.   On: 03/09/2017 16:09   Ct Cervical Spine Wo Contrast  Result Date: 03/09/2017 CLINICAL DATA:  Fall with altered mental status EXAM: CT HEAD WITHOUT CONTRAST CT CERVICAL SPINE WITHOUT CONTRAST TECHNIQUE: Multidetector CT imaging of the head and cervical spine was performed following the standard protocol without intravenous contrast. Multiplanar CT image reconstructions of the cervical spine were also generated. COMPARISON:  07/18/2011 head CT FINDINGS: CT HEAD FINDINGS Brain: No mass lesion, intraparenchymal hemorrhage or extra-axial collection. No evidence of acute cortical infarct. Ventriculomegaly is unchanged compared to the prior examination, likely secondary to  chronic volume loss. Vascular: No hyperdense vessel or unexpected calcification. Skull: Normal visualized skull base, calvarium and extracranial soft tissues. Sinuses/Orbits: No  sinus fluid levels or advanced mucosal thickening. No mastoid effusion. Normal orbits. CT CERVICAL SPINE FINDINGS Alignment: No static subluxation. Facets are aligned. Occipital condyles are normally positioned. Skull base and vertebrae: No acute fracture. Soft tissues and spinal canal: No prevertebral fluid or swelling. No visible canal hematoma. Disc levels: Multilevel cervical degenerative disc disease without advanced spinal canal or neural foraminal stenosis. Upper chest: No pneumothorax, pulmonary nodule or pleural effusion. Other: Normal visualized paraspinal cervical soft tissues. IMPRESSION: 1. Chronic volume loss, unchanged, without acute intracranial abnormality. 2. No acute fracture or static subluxation of the cervical spine. Electronically Signed   By: Ulyses Jarred M.D.   On: 03/09/2017 16:09   Dg Hip Unilat With Pelvis 2-3 Views Right  Result Date: 03/09/2017 CLINICAL DATA:  Right hip pain after fall. EXAM: DG HIP (WITH OR WITHOUT PELVIS) 2-3V RIGHT COMPARISON:  None. FINDINGS: Severely displaced right proximal femoral neck fracture is noted. Femoral head is well situated within the acetabulum. Mild degenerative joint disease is noted bilaterally. IMPRESSION: Severely displaced proximal right femoral neck fracture. Electronically Signed   By: Marijo Conception, M.D.   On: 03/09/2017 15:21    Review of Systems  Constitutional: Negative.   HENT: Negative.   Eyes: Negative.   Respiratory: Negative.   Cardiovascular: Negative.   Gastrointestinal: Positive for constipation and heartburn.  Genitourinary: Negative.   Musculoskeletal: Positive for back pain and joint pain.  Skin: Negative.   Neurological: Negative.   Endo/Heme/Allergies: Negative.   Psychiatric/Behavioral: Positive for depression and memory loss. The  patient is nervous/anxious.    Blood pressure (!) 134/116, pulse 94, temperature 98.3 F (36.8 C), temperature source Oral, resp. rate 19, weight 60.3 kg (132 lb 15 oz), SpO2 98 %.   Awake alert oriented to her current situation   Physical Exam  Constitutional: She appears well-developed.  HENT:  Head: Normocephalic.  Eyes: Pupils are equal, round, and reactive to light.  Neck: Neck supple. No JVD present. No tracheal deviation present. No thyromegaly present.  Cardiovascular: Normal rate, regular rhythm and intact distal pulses.   Respiratory: Effort normal and breath sounds normal. No respiratory distress. She has no wheezes.  GI: Soft. There is no tenderness. There is no guarding.  Musculoskeletal:       Right hip: She exhibits decreased range of motion, decreased strength, tenderness, bony tenderness and deformity. She exhibits no laceration.  Lymphadenopathy:    She has no cervical adenopathy.  Neurological: She is alert.  Skin: Skin is warm and dry.  Psychiatric: Cognition and memory are impaired.    Assessment/Plan: Displaced right proximal femoral neck fracture   Being evaluated by medicine and cardio Will need clearance prior to surgery Will need a hemiarthroplasty when cleared.     Pricilla Loveless 03/09/2017, 8:28 PM   Reviewed injury with patient. Await cardiac clearance prior to proceeding with right hip hemiarthroplasty hopefully tomorrow, 4/3 Agree with above note

## 2017-03-10 ENCOUNTER — Inpatient Hospital Stay (HOSPITAL_COMMUNITY): Payer: Medicare Other

## 2017-03-10 DIAGNOSIS — D631 Anemia in chronic kidney disease: Secondary | ICD-10-CM

## 2017-03-10 DIAGNOSIS — N186 End stage renal disease: Secondary | ICD-10-CM

## 2017-03-10 DIAGNOSIS — I213 ST elevation (STEMI) myocardial infarction of unspecified site: Secondary | ICD-10-CM | POA: Diagnosis present

## 2017-03-10 DIAGNOSIS — R410 Disorientation, unspecified: Secondary | ICD-10-CM | POA: Diagnosis present

## 2017-03-10 DIAGNOSIS — E440 Moderate protein-calorie malnutrition: Secondary | ICD-10-CM

## 2017-03-10 DIAGNOSIS — S72002A Fracture of unspecified part of neck of left femur, initial encounter for closed fracture: Secondary | ICD-10-CM

## 2017-03-10 DIAGNOSIS — D62 Acute posthemorrhagic anemia: Secondary | ICD-10-CM

## 2017-03-10 DIAGNOSIS — S72001A Fracture of unspecified part of neck of right femur, initial encounter for closed fracture: Secondary | ICD-10-CM | POA: Diagnosis present

## 2017-03-10 DIAGNOSIS — Z992 Dependence on renal dialysis: Secondary | ICD-10-CM

## 2017-03-10 LAB — MAGNESIUM: Magnesium: 2.1 mg/dL (ref 1.7–2.4)

## 2017-03-10 LAB — RENAL FUNCTION PANEL
Albumin: 3.7 g/dL (ref 3.5–5.0)
Anion gap: 16 — ABNORMAL HIGH (ref 5–15)
BUN: 72 mg/dL — AB (ref 6–20)
CALCIUM: 9.6 mg/dL (ref 8.9–10.3)
CO2: 11 mmol/L — ABNORMAL LOW (ref 22–32)
CREATININE: 9.22 mg/dL — AB (ref 0.44–1.00)
Chloride: 111 mmol/L (ref 101–111)
GFR calc Af Amer: 4 mL/min — ABNORMAL LOW (ref >60)
GFR, EST NON AFRICAN AMERICAN: 3 mL/min — AB (ref >60)
Glucose, Bld: 127 mg/dL — ABNORMAL HIGH (ref 65–99)
Phosphorus: 6.7 mg/dL — ABNORMAL HIGH (ref 2.5–4.6)
Potassium: 6.1 mmol/L — ABNORMAL HIGH (ref 3.5–5.1)
Sodium: 138 mmol/L (ref 135–145)

## 2017-03-10 LAB — CBC
HEMATOCRIT: 33.5 % — AB (ref 36.0–46.0)
Hemoglobin: 10.7 g/dL — ABNORMAL LOW (ref 12.0–15.0)
MCH: 24.7 pg — ABNORMAL LOW (ref 26.0–34.0)
MCHC: 31.9 g/dL (ref 30.0–36.0)
MCV: 77.2 fL — ABNORMAL LOW (ref 78.0–100.0)
PLATELETS: 252 10*3/uL (ref 150–400)
RBC: 4.34 MIL/uL (ref 3.87–5.11)
RDW: 16.2 % — AB (ref 11.5–15.5)
WBC: 9.5 10*3/uL (ref 4.0–10.5)

## 2017-03-10 LAB — BASIC METABOLIC PANEL
ANION GAP: 15 (ref 5–15)
BUN: 73 mg/dL — AB (ref 6–20)
CALCIUM: 9.2 mg/dL (ref 8.9–10.3)
CO2: 12 mmol/L — ABNORMAL LOW (ref 22–32)
CREATININE: 9.11 mg/dL — AB (ref 0.44–1.00)
Chloride: 110 mmol/L (ref 101–111)
GFR calc Af Amer: 4 mL/min — ABNORMAL LOW (ref >60)
GFR, EST NON AFRICAN AMERICAN: 4 mL/min — AB (ref >60)
GLUCOSE: 117 mg/dL — AB (ref 65–99)
Potassium: 6.5 mmol/L (ref 3.5–5.1)
Sodium: 137 mmol/L (ref 135–145)

## 2017-03-10 LAB — TSH: TSH: 4.2 u[IU]/mL (ref 0.350–4.500)

## 2017-03-10 LAB — TROPONIN I
Troponin I: 1.33 ng/mL (ref <0.03)
Troponin I: 1.31 ng/mL (ref <0.03)

## 2017-03-10 LAB — HEPARIN LEVEL (UNFRACTIONATED)
Heparin Unfractionated: 0.7 IU/mL (ref 0.30–0.70)
Heparin Unfractionated: >2.2 IU/mL — ABNORMAL HIGH (ref 0.30–0.70)
Heparin Unfractionated: 0.59 IU/mL (ref 0.30–0.70)

## 2017-03-10 LAB — MRSA PCR SCREENING: MRSA by PCR: NEGATIVE

## 2017-03-10 MED ORDER — SODIUM CHLORIDE 0.9 % IV SOLN
125.0000 mg | INTRAVENOUS | Status: DC
  Administered 2017-03-15 – 2017-03-18 (×3): 125 mg via INTRAVENOUS
  Filled 2017-03-10 (×8): qty 10

## 2017-03-10 MED ORDER — CALCIUM ACETATE (PHOS BINDER) 667 MG PO CAPS: 1334.0000 mg | ORAL_CAPSULE | ORAL | Status: DC | PRN

## 2017-03-10 MED ORDER — NITROGLYCERIN 0.4 MG SL SUBL: 0.4000 mg | SUBLINGUAL_TABLET | SUBLINGUAL | Status: DC | PRN

## 2017-03-10 MED ORDER — ALPRAZOLAM 0.5 MG PO TABS
0.5000 mg | ORAL_TABLET | Freq: Two times a day (BID) | ORAL | Status: DC
  Administered 2017-03-10 – 2017-03-19 (×20): 0.5 mg via ORAL
  Filled 2017-03-10 (×20): qty 1

## 2017-03-10 MED ORDER — DEXTROSE 50 % IV SOLN: 50.0000 mL | INTRAVENOUS | Status: DC

## 2017-03-10 MED ORDER — FENTANYL CITRATE (PF) 100 MCG/2ML IJ SOLN
50.0000 ug | INTRAMUSCULAR | Status: DC | PRN
  Administered 2017-03-10 – 2017-03-13 (×12): 50 ug via INTRAVENOUS
  Filled 2017-03-10 (×11): qty 2

## 2017-03-10 MED ORDER — SODIUM POLYSTYRENE SULFONATE 15 GM/60ML PO SUSP
30.0000 g | ORAL | Status: DC
  Filled 2017-03-10: qty 120

## 2017-03-10 MED ORDER — TETRAHYDROZOLINE HCL 0.05 % OP SOLN
1.0000 [drp] | Freq: Every day | OPHTHALMIC | Status: DC | PRN
  Administered 2017-03-19: 1 [drp] via OPHTHALMIC
  Filled 2017-03-10: qty 15

## 2017-03-10 MED ORDER — ACETAMINOPHEN 325 MG PO TABS
650.0000 mg | ORAL_TABLET | Freq: Four times a day (QID) | ORAL | Status: DC | PRN
Start: 2017-03-10 — End: 2017-03-19
  Administered 2017-03-10 – 2017-03-19 (×11): 650 mg via ORAL
  Filled 2017-03-10 (×10): qty 2

## 2017-03-10 MED ORDER — ACETAMINOPHEN 650 MG RE SUPP: 650.0000 mg | Freq: Four times a day (QID) | RECTAL | Status: DC | PRN

## 2017-03-10 MED ORDER — RENA-VITE PO TABS
1.0000 | ORAL_TABLET | Freq: Every day | ORAL | Status: DC
  Administered 2017-03-10 – 2017-03-18 (×9): 1 via ORAL
  Filled 2017-03-10 (×9): qty 1

## 2017-03-10 MED ORDER — INSULIN ASPART 100 UNIT/ML IV SOLN: 10.0000 [IU] | INTRAVENOUS | Status: DC

## 2017-03-10 MED ORDER — DARBEPOETIN ALFA 100 MCG/0.5ML IJ SOSY
100.0000 ug | PREFILLED_SYRINGE | INTRAMUSCULAR | Status: DC
  Administered 2017-03-16: 100 ug via INTRAVENOUS
  Filled 2017-03-10: qty 0.5

## 2017-03-10 MED ORDER — POLYETHYLENE GLYCOL 3350 17 G PO PACK
17.0000 g | PACK | Freq: Every day | ORAL | Status: DC | PRN
  Filled 2017-03-10: qty 1

## 2017-03-10 MED ORDER — CALCITRIOL 0.5 MCG PO CAPS
0.7500 ug | ORAL_CAPSULE | ORAL | Status: DC
  Administered 2017-03-16 – 2017-03-18 (×2): 0.75 ug via ORAL
  Filled 2017-03-10 (×3): qty 1

## 2017-03-10 MED ORDER — SODIUM CHLORIDE 0.9 % IV SOLN
INTRAVENOUS | Status: DC
  Administered 2017-03-10: 01:00:00 via INTRAVENOUS

## 2017-03-10 MED ORDER — CALCIUM ACETATE (PHOS BINDER) 667 MG PO CAPS
667.0000 mg | ORAL_CAPSULE | Freq: Three times a day (TID) | ORAL | Status: DC
  Administered 2017-03-10 – 2017-03-19 (×18): 667 mg via ORAL
  Filled 2017-03-10 (×20): qty 1

## 2017-03-10 MED ORDER — NITROGLYCERIN 2 % TD OINT
0.5000 [in_us] | TOPICAL_OINTMENT | Freq: Four times a day (QID) | TRANSDERMAL | Status: DC
  Administered 2017-03-11 – 2017-03-15 (×16): 0.5 [in_us] via TOPICAL
  Filled 2017-03-10 (×2): qty 30

## 2017-03-10 MED ORDER — SODIUM CHLORIDE 0.9% FLUSH
3.0000 mL | Freq: Two times a day (BID) | INTRAVENOUS | Status: DC
  Administered 2017-03-10 – 2017-03-18 (×12): 3 mL via INTRAVENOUS

## 2017-03-10 MED ORDER — FENTANYL CITRATE (PF) 100 MCG/2ML IJ SOLN
INTRAMUSCULAR | Status: AC
  Filled 2017-03-10: qty 2

## 2017-03-10 MED ORDER — VENLAFAXINE HCL ER 37.5 MG PO CP24
37.5000 mg | ORAL_CAPSULE | Freq: Every day | ORAL | Status: DC
  Administered 2017-03-10 – 2017-03-18 (×10): 37.5 mg via ORAL
  Filled 2017-03-10 (×11): qty 1

## 2017-03-10 MED ORDER — FAMOTIDINE 20 MG PO TABS
20.0000 mg | ORAL_TABLET | Freq: Every day | ORAL | Status: DC
  Administered 2017-03-10 – 2017-03-19 (×10): 20 mg via ORAL
  Filled 2017-03-10 (×11): qty 1

## 2017-03-10 MED ORDER — ATORVASTATIN CALCIUM 40 MG PO TABS
40.0000 mg | ORAL_TABLET | Freq: Every day | ORAL | Status: DC
  Administered 2017-03-10 – 2017-03-18 (×7): 40 mg via ORAL
  Filled 2017-03-10 (×10): qty 1

## 2017-03-10 NOTE — Progress Notes (Signed)
SLP Cancellation Note  Patient Details Name: Monica [name] MRN: [id number] DOB: 1932-02-02   Cancelled treatment:       Reason Eval/Treat Not Completed: Patient at procedure or test/unavailable in HD per RN.   Blenda Mounts Laurice 03/10/2017, 12:38 PM

## 2017-03-10 NOTE — Progress Notes (Addendum)
Pt arrived to 2w15. VSS, denies any SOB or chest pain. Does c/o R hip pain, unable to rate. Pain medication given. Tele box placed, CCMD notified. Pt is AOx3, unsure of the month and year. Unable to recall falling. Updated on plan of care. Oriented to room and call bell. Will continue to monitor.  Margarito Liner, RN

## 2017-03-10 NOTE — Consult Note (Signed)
[facility] Renal Consultation Note    Indication for Consultation:  Management of ESRD/hemodialysis; anemia, hypertension/volume and secondary hyperparathyroidism Referring physician:  IM Teaching service  HPI: Monica [name] is a 83 y.o. female with ESRD on MWF dialysis at [facility] since November 2017 who missed HD yesterday due to a fall with presentation to the ED with new hip fx.  PMHx is significant for PAF not on anicoag due to hx of GIB, etoh abuse in the 1980s, anxiety/depression/dementia.  She had an arm AVGG removed in February due to steal.  She lives alone and has a neighbor that checks on her daily. . According to the primary H and P per the neighbor,  the patient missed dialysis Friday because she "didn't fee like going".  Saturday she seemed ok and Sunday she reported that she "didn't feel good."  Monday, the morning of admission, the neighbor found her on the bedroom floor in her own urine. The patient has reportedly been more confused that usual and "saying weird things and reporting pocket books and ice cream hanging from the ceiling and staying that her children are dead when actually they are alive."  She reportedly has a home health RN. The neighbor called EMS and patient was brought to the ED where evaluation found her to have a right femoral neck fracture. K 5.8 now up to 6.5, low bicarb, initial trop 0.85 now up to 1.3 and possible STEMI. TSH 4.2 hgb 10.7 consistent with outpatient hgb She reportedly had been taking NTG daily. CXR was NAD.  She tells me today her back and leg hurts and she had diarrhea last night. She is breathing easily without CP at present.  She doesn't remember much about falling. She has 3 children 2 boys in the arm and 1 daughter who lives in [place] that she saw about a month ago. Her niece and neighbor are her primary support people. She knows she is at [facility] in [place]. She thinks it is 63. She knows she needs surgery for her hip.    Past Medical History:  Diagnosis Date  . Anxiety   . Arthritis    "all over" (01/29/2017)  . Atrial flutter (HCC)   . Chronic back pain    "all my back" (01/29/2017)  . Constipation   . Dementia   . Depression   . ESRD (end stage renal disease) on dialysis [facility])    "MWF; Rudene Anda" (01/29/2017)  . ETOHism (HCC)    Quit in the 1980s.  . Gastric ulcer 12/2013.   Prepyloric ulcer. On follow-up EGD in 02/2014 this was 90% healed  . GERD (gastroesophageal reflux disease)   . Headache    as a young woman  . Hemorrhoids   . History of kidney stones   . History of stomach ulcers   . Hypertension   . Pancreatic cyst 09/2010.   At uncinate process.  Serial CT imaging favors benign process   Past Surgical History:  Procedure Laterality Date  . ABDOMINAL HYSTERECTOMY    . ARTERIOVENOUS GRAFT PLACEMENT Left 01/29/2017   INSERTION OF ARTERIOVENOUS (AV) GORE-TEX GRAFT  STRETCHED IN LEFT FORE ARM   . AV FISTULA PLACEMENT Left 03/20/2015   Procedure: ARTERIOVENOUS (AV) FISTULA CREATION;  Surgeon: Chuck Hint, MD;  Location: [facility] OR;  Service: Vascular;  Laterality: Left;  . AV FISTULA PLACEMENT Left 01/29/2017   Procedure: INSERTION OF ARTERIOVENOUS (AV) GORE-TEX GRAFT  STRETCHED IN LEFT FORE ARM;  Surgeon: Chuck Hint,  MD;  Location: [facility] OR;  Service: Vascular;  Laterality: Left;  . AVGG REMOVAL Left 01/31/2017   Procedure: REMOVAL OF ARTERIOVENOUS GORETEX GRAFT (AVGG) WITH Vein patching.;  Surgeon: Fransisco Hertz, MD;  Location: [facility] OR;  Service: Vascular;  Laterality: Left;  . CATARACT EXTRACTION Right   . COLONOSCOPY  2010   Per Dr. Loreta Ave  . ESOPHAGOGASTRODUODENOSCOPY N/A 12/29/2013   Procedure: ESOPHAGOGASTRODUODENOSCOPY (EGD);  Surgeon: Hart Carwin, MD;  Location: [facility] ENDOSCOPY;  Service: Endoscopy;  Laterality: N/A;  . EXPLORATORY LAPAROTOMY  1980   w/LOA  . FLEXIBLE SIGMOIDOSCOPY  08/2011   Per Dr. Randa Evens. Normal study. Random biopsies positive for trace melanosis  coli.  Marland Kitchen FOOT SURGERY Bilateral    "surgery for flat feet"  . INSERTION OF DIALYSIS CATHETER Right 11/25/2016   Procedure: INSERTION OF Right Internal jugular DIALYSIS CATHETER;  Surgeon: Fransisco Hertz, MD;  Location: [facility] OR;  Service: Vascular;  Laterality: Right;  . SHOULDER ARTHROSCOPY W/ ROTATOR CUFF REPAIR Right 08/2003   Hattie Perch 01/29/2017  . TIBIA FRACTURE SURGERY Right    "broke between my knee and my ankle"  . TOTAL ABDOMINAL HYSTERECTOMY W/ BILATERAL SALPINGOOPHORECTOMY  1980  . TUBAL LIGATION     Family History  Problem Relation Age of Onset  . Alcohol abuse Mother    Social History:  reports that she quit smoking about 31 years ago. Her smoking use included Cigarettes. She has a 16.00 pack-year smoking history. She has never used smokeless tobacco. She reports that she drinks alcohol. She reports that she does not use drugs. Allergies  Allergen Reactions  . Aspirin Other (See Comments)    "makes my stomach hurt"  . Codeine Other (See Comments)    "i get irritated"   Prior to Admission medications   Medication Sig Start Date End Date Taking? Authorizing Provider  acetaminophen (TYLENOL) 500 MG tablet Take 1,000 mg by mouth every 6 (six) hours as needed for mild pain.   Yes Historical Provider, MD  ALPRAZolam Prudy Feeler) 0.5 MG tablet Take 1 tablet (0.5 mg total) by mouth 2 (two) times daily. 01/02/17  Yes Kirt Boys, DO  calcium acetate (PHOSLO) 667 MG tablet Take 667 mg by mouth See admin instructions. Take 2 tablets (1334 mg) by mouth 3 times daily with meals and with snacks   Yes Historical Provider, MD  metoprolol tartrate (LOPRESSOR) 25 MG tablet Take 12.5 mg by mouth 2 (two) times daily.    Yes Historical Provider, MD  nitroGLYCERIN (NITROSTAT) 0.4 MG SL tablet Place 0.4 mg under the tongue every 5 (five) minutes as needed for chest pain.   Yes Historical Provider, MD  pantoprazole (PROTONIX) 40 MG tablet Take 1 tablet (40 mg total) by mouth daily. 03/21/15  Yes Alyssa A  Kennon Rounds, MD  Tetrahydrozoline HCl (VISINE OP) Place 2 drops into both eyes daily as needed (dry eyes).   Yes Historical Provider, MD  venlafaxine XR (EFFEXOR-XR) 37.5 MG 24 hr capsule Take 37.5 mg by mouth at bedtime. 02/11/17  Yes Historical Provider, MD  HYDROcodone-acetaminophen (NORCO/VICODIN) 5-325 MG tablet Take 1 tablet by mouth every 12 (twelve) hours as needed for moderate pain. DNE 3gm of APAP/24hrs Patient not taking: Reported on 03/03/2017 01/30/17   Raymond Gurney, PA-C   Current Facility-Administered Medications  Medication Dose Route Frequency Provider Last Rate Last Dose  . acetaminophen (TYLENOL) tablet 650 mg  650 mg Oral Q6H PRN Beaulah Dinning, MD       Or  . acetaminophen (  TYLENOL) suppository 650 mg  650 mg Rectal Q6H PRN Beaulah Dinning, MD      . ALPRAZolam Prudy Feeler) tablet 0.5 mg  0.5 mg Oral BID Beaulah Dinning, MD   0.5 mg at 03/10/17 0842  . aspirin EC tablet 81 mg  81 mg Oral Daily Rinaldo Cloud, MD   81 mg at 03/10/17 0843  . [START ON 03/11/2017] calcitRIOL (ROCALTROL) capsule 0.75 mcg  0.75 mcg Oral Q M,W,F-HD Weston Settle, PA-C      . calcium acetate (PHOSLO) capsule 1,334 mg  1,334 mg Oral PRN Leighton Roach McDiarmid, MD      . calcium acetate (PHOSLO) capsule 667 mg  667 mg Oral TID WC Beaulah Dinning, MD   667 mg at 03/10/17 0842  . [START ON 03/16/2017] Darbepoetin Alfa (ARANESP) injection 100 mcg  100 mcg Intravenous Q Mon-HD Weston Settle, PA-C      . fentaNYL (SUBLIMAZE) injection 50 mcg  50 mcg Intravenous Q2H PRN Beaulah Dinning, MD   50 mcg at 03/10/17 0355  . [START ON 03/11/2017] ferric gluconate (NULECIT) 125 mg in sodium chloride 0.9 % 100 mL IVPB  125 mg Intravenous Q M,W,F-HD Weston Settle, PA-C      . heparin ADULT infusion 100 units/mL (25000 units/279mL sodium chloride 0.45%)  750 Units/hr Intravenous Continuous Emi Holes, RPH 7.5 mL/hr at 03/09/17 2105 750 Units/hr at 03/09/17 2105  . metoprolol tartrate (LOPRESSOR) tablet 25 mg   25 mg Oral BID Rinaldo Cloud, MD   25 mg at 03/10/17 0842  . multivitamin (RENA-VIT) tablet 1 tablet  1 tablet Oral QHS Weston Settle, PA-C      . nitroGLYCERIN (NITROGLYN) 2 % ointment 1 inch  1 inch Topical Q6H Rinaldo Cloud, MD   1 inch at 03/10/17 [phone]  . nitroGLYCERIN (NITROSTAT) SL tablet 0.4 mg  0.4 mg Sublingual Q5 min PRN Beaulah Dinning, MD      . pantoprazole (PROTONIX) EC tablet 40 mg  40 mg Oral Q0600 Rinaldo Cloud, MD   40 mg at 03/10/17 0615  . polyethylene glycol (MIRALAX / GLYCOLAX) packet 17 g  17 g Oral Daily PRN Beaulah Dinning, MD      . sodium chloride flush (NS) 0.9 % injection 3 mL  3 mL Intravenous Q12H Beaulah Dinning, MD      . tetrahydrozoline 0.05 % ophthalmic solution 1 drop  1 drop Both Eyes Daily PRN Beaulah Dinning, MD      . venlafaxine XR (EFFEXOR-XR) 24 hr capsule 37.5 mg  37.5 mg Oral QHS Beaulah Dinning, MD   37.5 mg at 03/10/17 0121   Labs: Basic Metabolic Panel:  Recent Labs Lab 03/09/17 1407 03/10/17 0132 03/10/17 0717  NA 137 138 137  K 5.8* 6.1* 6.5*  CL 108 111 110  CO2 14* 11* 12*  GLUCOSE 154* 127* 117*  BUN 67* 72* 73*  CREATININE 9.24* 9.22* 9.11*  CALCIUM 9.6 9.6 9.2  PHOS  --  6.7*  --    Liver Function Tests:  Recent Labs Lab 03/09/17 1407 03/10/17 0132  AST 21  --   ALT 15  --   ALKPHOS 67  --   BILITOT 0.7  --   PROT 7.9  --   ALBUMIN 3.8 3.7   No results for input(s): LIPASE, AMYLASE in the last 168 hours. No results for input(s): AMMONIA in the last 168 hours. CBC:  Recent Labs Lab 03/09/17 1407 03/10/17 0717  WBC 10.2  9.5  NEUTROABS 9.2*  --   HGB 11.4* 10.7*  HCT 36.1 33.5*  MCV 78.0 77.2*  PLT 276 252   Cardiac Enzymes:  Recent Labs Lab 03/09/17 1407 03/09/17 2126 03/10/17 0132 03/10/17 0717  CKTOTAL 170  --   --   --   TROPONINI  --  1.01* 1.33* 1.31*   Studies/Results: Dg Chest 1 View  Result Date: 03/09/2017 CLINICAL DATA:  Fall. EXAM: CHEST 1 VIEW COMPARISON:   Radiograph November 25, 2016. FINDINGS: Stable cardiomediastinal silhouette. Left internal jugular dialysis catheter is unchanged in position. No pneumothorax or pleural effusion is noted. No acute pulmonary disease is noted. Bony thorax is unremarkable. IMPRESSION: No acute cardiopulmonary abnormality seen. Electronically Signed   By: Lupita Raider, M.D.   On: 03/09/2017 15:23   Dg Lumbar Spine Complete  Result Date: 03/09/2017 CLINICAL DATA:  Low back pain after fall. EXAM: LUMBAR SPINE - COMPLETE 4+ VIEW COMPARISON:  CT scan of August 13, 2016. FINDINGS: Diffuse osteopenia is noted. Atherosclerosis thoracic aorta is noted. No fracture or spondylolisthesis is noted. Mild degenerative disc disease is noted at L3-4, L4-5 and L5-S1. IMPRESSION: Mild multilevel degenerative disc disease. Aortic atherosclerosis. No acute abnormality seen in the lumbar spine. Electronically Signed   By: Lupita Raider, M.D.   On: 03/09/2017 15:19   Dg Shoulder Right  Result Date: 03/09/2017 CLINICAL DATA:  Right shoulder pain after fall. EXAM: RIGHT SHOULDER - 2+ VIEW COMPARISON:  None. FINDINGS: There is no evidence of fracture or dislocation. Mild degenerative changes seen involving right glenohumeral joint. Soft tissues are unremarkable. IMPRESSION: Mild degenerative joint disease of right glenohumeral joint. No acute abnormality seen in the right shoulder. Electronically Signed   By: Lupita Raider, M.D.   On: 03/09/2017 15:16   Ct Head Wo Contrast  Result Date: 03/09/2017 CLINICAL DATA:  Fall with altered mental status EXAM: CT HEAD WITHOUT CONTRAST CT CERVICAL SPINE WITHOUT CONTRAST TECHNIQUE: Multidetector CT imaging of the head and cervical spine was performed following the standard protocol without intravenous contrast. Multiplanar CT image reconstructions of the cervical spine were also generated. COMPARISON:  07/18/2011 head CT FINDINGS: CT HEAD FINDINGS Brain: No mass lesion, intraparenchymal hemorrhage or  extra-axial collection. No evidence of acute cortical infarct. Ventriculomegaly is unchanged compared to the prior examination, likely secondary to chronic volume loss. Vascular: No hyperdense vessel or unexpected calcification. Skull: Normal visualized skull base, calvarium and extracranial soft tissues. Sinuses/Orbits: No sinus fluid levels or advanced mucosal thickening. No mastoid effusion. Normal orbits. CT CERVICAL SPINE FINDINGS Alignment: No static subluxation. Facets are aligned. Occipital condyles are normally positioned. Skull base and vertebrae: No acute fracture. Soft tissues and spinal canal: No prevertebral fluid or swelling. No visible canal hematoma. Disc levels: Multilevel cervical degenerative disc disease without advanced spinal canal or neural foraminal stenosis. Upper chest: No pneumothorax, pulmonary nodule or pleural effusion. Other: Normal visualized paraspinal cervical soft tissues. IMPRESSION: 1. Chronic volume loss, unchanged, without acute intracranial abnormality. 2. No acute fracture or static subluxation of the cervical spine. Electronically Signed   By: Deatra Robinson M.D.   On: 03/09/2017 16:09   Ct Cervical Spine Wo Contrast  Result Date: 03/09/2017 CLINICAL DATA:  Fall with altered mental status EXAM: CT HEAD WITHOUT CONTRAST CT CERVICAL SPINE WITHOUT CONTRAST TECHNIQUE: Multidetector CT imaging of the head and cervical spine was performed following the standard protocol without intravenous contrast. Multiplanar CT image reconstructions of the cervical spine were also generated. COMPARISON:  07/18/2011 head  CT FINDINGS: CT HEAD FINDINGS Brain: No mass lesion, intraparenchymal hemorrhage or extra-axial collection. No evidence of acute cortical infarct. Ventriculomegaly is unchanged compared to the prior examination, likely secondary to chronic volume loss. Vascular: No hyperdense vessel or unexpected calcification. Skull: Normal visualized skull base, calvarium and extracranial  soft tissues. Sinuses/Orbits: No sinus fluid levels or advanced mucosal thickening. No mastoid effusion. Normal orbits. CT CERVICAL SPINE FINDINGS Alignment: No static subluxation. Facets are aligned. Occipital condyles are normally positioned. Skull base and vertebrae: No acute fracture. Soft tissues and spinal canal: No prevertebral fluid or swelling. No visible canal hematoma. Disc levels: Multilevel cervical degenerative disc disease without advanced spinal canal or neural foraminal stenosis. Upper chest: No pneumothorax, pulmonary nodule or pleural effusion. Other: Normal visualized paraspinal cervical soft tissues. IMPRESSION: 1. Chronic volume loss, unchanged, without acute intracranial abnormality. 2. No acute fracture or static subluxation of the cervical spine. Electronically Signed   By: Deatra Robinson M.D.   On: 03/09/2017 16:09   Dg Hip Unilat With Pelvis 2-3 Views Right  Result Date: 03/09/2017 CLINICAL DATA:  Right hip pain after fall. EXAM: DG HIP (WITH OR WITHOUT PELVIS) 2-3V RIGHT COMPARISON:  None. FINDINGS: Severely displaced right proximal femoral neck fracture is noted. Femoral head is well situated within the acetabulum. Mild degenerative joint disease is noted bilaterally. IMPRESSION: Severely displaced proximal right femoral neck fracture. Electronically Signed   By: Lupita Raider, M.D.   On: 03/09/2017 15:21    ROS: As per HPI otherwise negative. Pt very poor historian.  Physical Exam: Vitals:   03/09/17 2141 03/09/17 2149 03/10/17 0050 03/10/17 0413  BP: (!) 168/107 (!) 179/97 (!) 174/99 (!) 142/83  Pulse:    90  Resp: (!) 24 16 20 16   Temp: 98.9 F (37.2 C)  98.3 F (36.8 C) 98.5 F (36.9 C)  TempSrc: Oral  Oral Oral  SpO2: 100% 99% 98% 95%  Weight:   58.9 kg (129 lb 14.4 oz)   Height:   5' (1.524 m)      General: frail elderly AAF Head: NCAT sclera not icteric MMM Neck: Supple.  Lungs: CTA Breathing is unlabored. Heart: RRR with S1 S2.  Abdomen: soft slight  mid abd + BS Lower extremities:without edema or ischemic changes, no open wounds  Neuro/psych:  Responds to questions appropriately with a normal affect., some disorientation Dialysis Access: left IJ  Dialysis Orders: [facility] MWF 4.25 hr 400/800 EDW 59 2 K 2 Ca calcitriol 0.75 heparin 5000 mircera 75 3/14 - just decreased from 100 Left IJ will prior steal in prior graft - not sure how good of candidate she is for other access Recent labs: hgb 10 down from 11/8 3/14 17% sat (Fe not ordered) ferritin 346 iPTh 1022   Assessment/Plan: 1. Encephalopathy -multifactorial - dementia in the setting of missed dialysis +other 2. Right fem neck fx - for surgical clearance - ortho has seen; will need SNF placement 3. STEMI - cards following 4. Hyperkalemia K 6.2 - missed HD- will correct with HD today 5. ESRD -  MWF - missed HD yesterday due to admission - plan HD today and Thurs/Sat then back on schedule 6. Hypertension/volume  - volume status ok- avoid BP drops 7. Anemia  - continue Fe and ESA 8. Metabolic bone disease -  Continue Hectorol and binders 9. Nutrition - renal diet/vit/suppl when eating - currently NPO  Sheffield Slider, PA-C [facility] Beeper [phone] 03/10/2017, 10:04 AM   Pt seen, examined and  agree w A/P as above. ESRD patient with AMS, R hip fracture, found on the floor by family.  Hx dementia.  To go to surgery per ortho.  Will have short HD today and regular HD tomorrow.  Vinson Moselle MD [employer] pager [phone]   03/10/2017, 1:11 PM

## 2017-03-10 NOTE — Care Management Note (Signed)
Case Management Note Donn Pierini RN, BSN Unit 2W-Case Manager [id number]  Patient Details  Name: MARIAPAULA KRIST MRN: [id number] Date of Birth: [birth year]  Subjective/Objective:  Pt admitted s/p fall at home with hip fx- will need repair                   Action/Plan: PTA pt lived at home- awaiting cards clearance for OR- will await therapy evals post hip repair for d/c planning- most likely will need SNF.   Expected Discharge Date:           Expected Discharge Plan:  Skilled Nursing Facility  In-House Referral:  Clinical Social Work  Discharge planning Services  CM Consult  Post Acute Care Choice:  NA Choice offered to:  NA  DME Arranged:    DME Agency:     HH Arranged:    HH Agency:     Status of Service:  In process, will continue to follow  If discussed at Long Length of Stay Meetings, dates discussed:    Discharge Disposition:   Additional Comments:  Darrold Span, RN 03/10/2017, 11:41 AM

## 2017-03-10 NOTE — Progress Notes (Signed)
     Subjective: Right hip fracture  Patient states that she does have some pain in the right hip when it is moved.  States that she is doing well other than being exhausted from dialysis just prior to my arrival.   Objective:   VITALS:   Vitals:   03/10/17 1330 03/10/17 1400  BP: (!) 80/58 (!) 87/56  Pulse: 88 68  Resp: (!) 22 (!) 22  Temp:     Pain with movement of the right hip Dorsiflexion/Plantar flexion intact No cellulitis present Compartment soft  LABS  Recent Labs  03/09/17 1407 03/10/17 0717  HGB 11.4* 10.7*  HCT 36.1 33.5*  WBC 10.2 9.5  PLT 276 252     Recent Labs  03/09/17 1407 03/10/17 0132 03/10/17 0717  NA 137 138 137  K 5.8* 6.1* 6.5*  BUN 67* 72* 73*  CREATININE 9.24* 9.22* 9.11*  GLUCOSE 154* 127* 117*     Assessment/Plan: Right hip fracture   NPO after MN. Discussed with Dr. Linna Caprice who plan on doing surgery on 03/12/17 as long as pt is cleared medically.   OA of the right hip and the surgery planned is a anterior THA. Dr. Linna Caprice will see the patient and proceed with surgery.      Anastasio Auerbach Hannan Tetzlaff   PAC  03/10/2017, 3:48 PM

## 2017-03-10 NOTE — Progress Notes (Signed)
ANTICOAGULATION CONSULT NOTE - Follow Up Consult  Pharmacy Consult for Heparin Indication: chest pain/ACS  Allergies  Allergen Reactions  . Aspirin Other (See Comments)    "makes my stomach hurt"  . Codeine Other (See Comments)    "i get irritated"    Patient Measurements: Height: 5' (152.4 cm) Weight: 129 lb 14.4 oz (58.9 kg) IBW/kg (Calculated) : 45.5 Heparin Dosing Weight:  57.5 kg  Vital Signs: Temp: 98.5 F (36.9 C) (04/03 0413) Temp Source: Oral (04/03 0413) BP: 142/83 (04/03 0413) Pulse Rate: 90 (04/03 0413)  Labs:  Recent Labs  03/09/17 1407 03/09/17 2126 03/10/17 0132 03/10/17 0717 03/10/17 0901  HGB 11.4*  --   --  10.7*  --   HCT 36.1  --   --  33.5*  --   PLT 276  --   --  252  --   HEPARINUNFRC  --   --  0.70 >2.20* 0.59  CREATININE 9.24*  --  9.22* 9.11*  --   CKTOTAL 170  --   --   --   --   TROPONINI  --  1.01* 1.33* 1.31*  --     Estimated Creatinine Clearance: 3.8 mL/min (A) (by C-G formula based on SCr of 9.11 mg/dL (H)).   Assessment:  Anticoag: Heparin for NSTEMI. HL 0.7>now >2.2. Hgb 11.4>10.7. Level was drawn from hand just below IV site. Recheck HL 0.59 in goal  Goal of Therapy:  Heparin level 0.3-0.7 units/ml Monitor platelets by anticoagulation protocol: Yes   Plan:  Heparin levels require foot sticks Continue IV heparin at 750 units/hr Daily HL and CBC Need R THA (needs cardiac clearance)   Gresia Isidoro S. Merilynn Finland, PharmD, BCPS Clinical Staff Pharmacist Pager [phone]  Misty Stanley Stillinger 03/10/2017,11:06 AM

## 2017-03-10 NOTE — Progress Notes (Signed)
Family Medicine Teaching Service Daily Progress Note Intern Pager: [phone]  Patient name: Monica [name] Medical record number: [id number] Date of birth: 1932-06-25 Age: 83 y.o. Gender: female  Primary Care Provider: No PCP Per Patient Consultants: Cardiology Code Status: Full  Chief Complaint: AMS and right hip pain  Assessment and Plan: Monica [name] is a 83 y.o. female presenting with AMS and right hip pain . PMH is significant for HTN, ESRD with HD MWF, Anxiety, Dementia  Right Femoral Neck Fracture: Likely from mechanical fall but unclear what happened as fall was unwitnessed. Right hip XRAY showing severely displaced right femoral neck fracture. -Orthopedics consulted, appreciate assistance -Tylenol 650 mg every 6 when necessary for mild pain and fentanyl 50 g every 2 when necessary for severe pain - vitamin D level - will and supplementation as needed - nutrition consult - will discontinue protonix and put in an H2 blocker  Concerns for STEMI: EKG showing new Q waves in V1 to V3 as well as signs of possible ST elevation in anterior leads. Troponin elevated to 0.86. Cardiology recommending heparin per pharmacy and increasing Lopressor to 50 mg twice a day. -Telemetry -Cardiology consulted, appreciate recommendations> needs echo prior to surgery -Aspirin -Trend troponins> 1.01> 1.33>1.31 -Echo pending -heparin per pharmacy -Lopressor 50 mg twice a day - start atorvastatin 40 mg   ESRD; HD on MWF: Missed her Friday and Monday HD prior to admission - Continue home phoslo - Consulted nephro, Going to dialysis today - Daily renal function panel  Acute toxic metabolic encephalopathy, improved:  Multifactorial likely in setting of missed dialysis though also with possible STEMI. Patient alert and oriented to place, says its march 2019, oriented to self, denies hallucinations this AM. - TSH WNL at 4.2 - monitor mental status after dialysis - NPO pending SLP  study  Dementia: Baseline dementia per Epic review as well as per discussion with her neighbor. Has some hallucinations as well as some lip tremor and hand and feet tremors,?Lewy body dementia - Continue to monitor mental status  Depression and Anxiety:  - Continue home Effexor and Xanax  FEN/GI: SLIV, nothing by mouth for now Prophylaxis: Heparin  Disposition: Admit to family practice teaching service, telemetry floor, attending Dr. McDiarmid  Subjective:  No acute events overnight. Patient oriented to self, place, and states it is March 2019 on my exam this morning. She is comfortable. She denies chest pain or pressure this AM and has not been feeling short of breath.  She denies active hallucinations this AM   Objective: Temp:  [98.3 F (36.8 C)-98.9 F (37.2 C)] 98.4 F (36.9 C) (04/03 1150) Pulse Rate:  [84-98] 84 (04/03 1300) Resp:  [14-38] 23 (04/03 1200) BP: (96-194)/(75-116) 96/76 (04/03 1300) SpO2:  [92 %-100 %] 95 % (04/03 0413) Weight:  [58.9 kg (129 lb 14.4 oz)-60.3 kg (132 lb 15 oz)] 59.6 kg (131 lb 6.3 oz) (04/03 1150) Physical Exam: General: NAD, rests comfortably in bed Cardiovascular: RRR, no m/r/g Respiratory: CTA bil, no W/R/R Abdomen: nontender, nondistended Extremities: R hip tenderness, no LE edema  Laboratory:  Recent Labs Lab 03/09/17 1407 03/10/17 0717  WBC 10.2 9.5  HGB 11.4* 10.7*  HCT 36.1 33.5*  PLT 276 252    Recent Labs Lab 03/09/17 1407 03/10/17 0132 03/10/17 0717  NA 137 138 137  K 5.8* 6.1* 6.5*  CL 108 111 110  CO2 14* 11* 12*  BUN 67* 72* 73*  CREATININE 9.24* 9.22* 9.11*  CALCIUM 9.6 9.6 9.2  PROT 7.9  --   --   BILITOT 0.7  --   --   ALKPHOS 67  --   --   ALT 15  --   --   AST 21  --   --   GLUCOSE 154* 127* 117*      Imaging/Diagnostic Tests: Dg Chest 1 View 03/09/2017 IMPRESSION: No acute cardiopulmonary abnormality seen.   Dg Lumbar Spine Complete-0Result Date: 03/09/2017 IMPRESSION: Mild multilevel  degenerative disc disease. Aortic atherosclerosis. No acute abnormality seen in the lumbar spine.   Dg Shoulder Right  Result Date: 03/09/2017 IMPRESSION: Mild degenerative joint disease of right glenohumeral joint. No acute abnormality seen in the right shoulder.  Ct Head Wo Contrast Result Date: 03/09/2017  IMPRESSION: 1. Chronic volume loss, unchanged, without acute intracranial abnormality. 2. No acute fracture or static subluxation of the cervical spine.   Ct Cervical Spine Wo Contrast 03/09/2017 tissues.  IMPRESSION:  1. Chronic volume loss, unchanged, without acute intracranial abnormality.  2. No acute fracture or static subluxation of the cervical spine.   Dg Hip Unilat With Pelvis 2-3 Views Right  03/09/2017  FINDINGS: Severely displaced right proximal femoral neck fracture is noted. Femoral head is well situated within the acetabulum. Mild degenerative joint disease is noted bilaterally.  IMPRESSION: Severely displaced proximal right femoral neck fracture.    Howard Pouch, MD 03/10/2017, 1:30 PM PGY-1, [facility] Family Medicine FPTS Intern pager: [phone], text pages welcome

## 2017-03-10 NOTE — Progress Notes (Signed)
ANTICOAGULATION CONSULT NOTE Pharmacy Consult for Heparin Indication: chest pain/ACS  Allergies  Allergen Reactions  . Aspirin Other (See Comments)    "makes my stomach hurt"  . Codeine Other (See Comments)    "i get irritated"    Patient Measurements: Height: 5' (152.4 cm) Weight: 129 lb 14.4 oz (58.9 kg) IBW/kg (Calculated) : 45.5  Vital Signs: Temp: 98.3 F (36.8 C) (04/03 0050) Temp Source: Oral (04/03 0050) BP: 174/99 (04/03 0050) Pulse Rate: 94 (04/02 1915)  Labs:  Recent Labs  03/09/17 1407 03/09/17 2126 03/10/17 0132  HGB 11.4*  --   --   HCT 36.1  --   --   PLT 276  --   --   HEPARINUNFRC  --   --  0.70  CREATININE 9.24*  --   --   CKTOTAL 170  --   --   TROPONINI  --  1.01*  --     Estimated Creatinine Clearance: 3.8 mL/min (A) (by C-G formula based on SCr of 9.24 mg/dL (H)).  Assessment: 83 yo female with elevated cardiac markers/NSTEMI, awaiting right hip fracture repair, for heparin.  Goal of Therapy:  Heparin level 0.3-0.7 units/ml Monitor platelets by anticoagulation protocol: Yes   Plan:  Continue Heparin at current rate for now Recheck level with next set of cardiac enzymes to verify  Eddie Candle 03/10/2017,2:09 AM

## 2017-03-10 NOTE — Progress Notes (Signed)
Subjective:  Patient is more alert and awake today denies any chest pain or shortness of breath. EKG this morning is reassuring with normalization of R-wave progression in anterior leads. Troponin I mildly elevated 2-D echo still pending  Objective:  Vital Signs in the last 24 hours: Temp:  [97.9 F (36.6 C)-98.9 F (37.2 C)] 98.5 F (36.9 C) (04/03 0413) Pulse Rate:  [84-98] 90 (04/03 0413) Resp:  [14-38] 16 (04/03 0413) BP: (134-199)/(83-116) 142/83 (04/03 0413) SpO2:  [92 %-100 %] 95 % (04/03 0413) Weight:  [129 lb 14.4 oz (58.9 kg)-132 lb 15 oz (60.3 kg)] 129 lb 14.4 oz (58.9 kg) (04/03 0050)  Intake/Output from previous day: 04/02 0701 - 04/03 0700 In: 286.1 [I.V.:286.1] Out: -  Intake/Output from this shift: No intake/output data recorded.  Physical Exam: Neck: no adenopathy, no carotid bruit, no JVD and supple, symmetrical, trachea midline Lungs: clear to auscultation bilaterally Heart: regular rate and rhythm, S1, S2 normal and Soft systolic murmur noted Abdomen: soft, non-tender; bowel sounds normal; no masses,  no organomegaly Extremities: No clubbing cyanosis or edema left leg, right leg shortening and external rotation  Lab Results:  Recent Labs  03/09/17 1407 03/10/17 0717  WBC 10.2 9.5  HGB 11.4* 10.7*  PLT 276 252    Recent Labs  03/10/17 0132 03/10/17 0717  NA 138 137  K 6.1* 6.5*  CL 111 110  CO2 11* 12*  GLUCOSE 127* 117*  BUN 72* 73*  CREATININE 9.22* 9.11*    Recent Labs  03/10/17 0132 03/10/17 0717  TROPONINI 1.33* 1.31*   Hepatic Function Panel  Recent Labs  03/09/17 1407 03/10/17 0132  PROT 7.9  --   ALBUMIN 3.8 3.7  AST 21  --   ALT 15  --   ALKPHOS 67  --   BILITOT 0.7  --    No results for input(s): CHOL in the last 72 hours. No results for input(s): PROTIME in the last 72 hours.  Imaging: Imaging results have been reviewed and Dg Chest 1 View  Result Date: 03/09/2017 CLINICAL DATA:  Fall. EXAM: CHEST 1 VIEW  COMPARISON:  Radiograph November 25, 2016. FINDINGS: Stable cardiomediastinal silhouette. Left internal jugular dialysis catheter is unchanged in position. No pneumothorax or pleural effusion is noted. No acute pulmonary disease is noted. Bony thorax is unremarkable. IMPRESSION: No acute cardiopulmonary abnormality seen. Electronically Signed   By: Lupita Raider, M.D.   On: 03/09/2017 15:23   Dg Lumbar Spine Complete  Result Date: 03/09/2017 CLINICAL DATA:  Low back pain after fall. EXAM: LUMBAR SPINE - COMPLETE 4+ VIEW COMPARISON:  CT scan of August 13, 2016. FINDINGS: Diffuse osteopenia is noted. Atherosclerosis thoracic aorta is noted. No fracture or spondylolisthesis is noted. Mild degenerative disc disease is noted at L3-4, L4-5 and L5-S1. IMPRESSION: Mild multilevel degenerative disc disease. Aortic atherosclerosis. No acute abnormality seen in the lumbar spine. Electronically Signed   By: Lupita Raider, M.D.   On: 03/09/2017 15:19   Dg Shoulder Right  Result Date: 03/09/2017 CLINICAL DATA:  Right shoulder pain after fall. EXAM: RIGHT SHOULDER - 2+ VIEW COMPARISON:  None. FINDINGS: There is no evidence of fracture or dislocation. Mild degenerative changes seen involving right glenohumeral joint. Soft tissues are unremarkable. IMPRESSION: Mild degenerative joint disease of right glenohumeral joint. No acute abnormality seen in the right shoulder. Electronically Signed   By: Lupita Raider, M.D.   On: 03/09/2017 15:16   Ct Head Wo Contrast  Result Date: 03/09/2017  CLINICAL DATA:  Fall with altered mental status EXAM: CT HEAD WITHOUT CONTRAST CT CERVICAL SPINE WITHOUT CONTRAST TECHNIQUE: Multidetector CT imaging of the head and cervical spine was performed following the standard protocol without intravenous contrast. Multiplanar CT image reconstructions of the cervical spine were also generated. COMPARISON:  07/18/2011 head CT FINDINGS: CT HEAD FINDINGS Brain: No mass lesion, intraparenchymal  hemorrhage or extra-axial collection. No evidence of acute cortical infarct. Ventriculomegaly is unchanged compared to the prior examination, likely secondary to chronic volume loss. Vascular: No hyperdense vessel or unexpected calcification. Skull: Normal visualized skull base, calvarium and extracranial soft tissues. Sinuses/Orbits: No sinus fluid levels or advanced mucosal thickening. No mastoid effusion. Normal orbits. CT CERVICAL SPINE FINDINGS Alignment: No static subluxation. Facets are aligned. Occipital condyles are normally positioned. Skull base and vertebrae: No acute fracture. Soft tissues and spinal canal: No prevertebral fluid or swelling. No visible canal hematoma. Disc levels: Multilevel cervical degenerative disc disease without advanced spinal canal or neural foraminal stenosis. Upper chest: No pneumothorax, pulmonary nodule or pleural effusion. Other: Normal visualized paraspinal cervical soft tissues. IMPRESSION: 1. Chronic volume loss, unchanged, without acute intracranial abnormality. 2. No acute fracture or static subluxation of the cervical spine. Electronically Signed   By: Deatra Robinson M.D.   On: 03/09/2017 16:09   Ct Cervical Spine Wo Contrast  Result Date: 03/09/2017 CLINICAL DATA:  Fall with altered mental status EXAM: CT HEAD WITHOUT CONTRAST CT CERVICAL SPINE WITHOUT CONTRAST TECHNIQUE: Multidetector CT imaging of the head and cervical spine was performed following the standard protocol without intravenous contrast. Multiplanar CT image reconstructions of the cervical spine were also generated. COMPARISON:  07/18/2011 head CT FINDINGS: CT HEAD FINDINGS Brain: No mass lesion, intraparenchymal hemorrhage or extra-axial collection. No evidence of acute cortical infarct. Ventriculomegaly is unchanged compared to the prior examination, likely secondary to chronic volume loss. Vascular: No hyperdense vessel or unexpected calcification. Skull: Normal visualized skull base, calvarium and  extracranial soft tissues. Sinuses/Orbits: No sinus fluid levels or advanced mucosal thickening. No mastoid effusion. Normal orbits. CT CERVICAL SPINE FINDINGS Alignment: No static subluxation. Facets are aligned. Occipital condyles are normally positioned. Skull base and vertebrae: No acute fracture. Soft tissues and spinal canal: No prevertebral fluid or swelling. No visible canal hematoma. Disc levels: Multilevel cervical degenerative disc disease without advanced spinal canal or neural foraminal stenosis. Upper chest: No pneumothorax, pulmonary nodule or pleural effusion. Other: Normal visualized paraspinal cervical soft tissues. IMPRESSION: 1. Chronic volume loss, unchanged, without acute intracranial abnormality. 2. No acute fracture or static subluxation of the cervical spine. Electronically Signed   By: Deatra Robinson M.D.   On: 03/09/2017 16:09   Dg Hip Unilat With Pelvis 2-3 Views Right  Result Date: 03/09/2017 CLINICAL DATA:  Right hip pain after fall. EXAM: DG HIP (WITH OR WITHOUT PELVIS) 2-3V RIGHT COMPARISON:  None. FINDINGS: Severely displaced right proximal femoral neck fracture is noted. Femoral head is well situated within the acetabulum. Mild degenerative joint disease is noted bilaterally. IMPRESSION: Severely displaced proximal right femoral neck fracture. Electronically Signed   By: Lupita Raider, M.D.   On: 03/09/2017 15:21    Cardiac Studies:  Assessment/Plan:  Status post fall sustaining right femoral neck fracture Status post recent small non-Q-wave myocardial infarction Hypertension End-stage renal disease on hemodialysis History of paroxysmal A. fib flutter History of peptic ulcer disease GERD Anemia of chronic disease Elevated blood sugar rule out diabetes mellitus Depression Anxiety disorder Dementia Plan Continue present management Check 2-D echo to evaluate  LV systolic function and wall motion abnormalities if  LV function appears in normal range, patient  willing to proceed with high-risk surgery. Will need cardiac evaluation once recovers from hip surgery in future.  LOS: 1 day    Rinaldo Cloud 03/10/2017, 11:34 AM

## 2017-03-11 ENCOUNTER — Inpatient Hospital Stay (HOSPITAL_COMMUNITY): Payer: Medicare Other

## 2017-03-11 DIAGNOSIS — E440 Moderate protein-calorie malnutrition: Secondary | ICD-10-CM | POA: Diagnosis present

## 2017-03-11 LAB — CBC
HCT: 32.3 % — ABNORMAL LOW (ref 36.0–46.0)
Hemoglobin: 10.4 g/dL — ABNORMAL LOW (ref 12.0–15.0)
MCH: 24.2 pg — AB (ref 26.0–34.0)
MCHC: 32.2 g/dL (ref 30.0–36.0)
MCV: 75.1 fL — ABNORMAL LOW (ref 78.0–100.0)
PLATELETS: 233 10*3/uL (ref 150–400)
RBC: 4.3 MIL/uL (ref 3.87–5.11)
RDW: 15.8 % — AB (ref 11.5–15.5)
WBC: 7.7 10*3/uL (ref 4.0–10.5)

## 2017-03-11 LAB — BASIC METABOLIC PANEL
Anion gap: 14 (ref 5–15)
BUN: 32 mg/dL — ABNORMAL HIGH (ref 6–20)
CO2: 21 mmol/L — ABNORMAL LOW (ref 22–32)
Calcium: 8.3 mg/dL — ABNORMAL LOW (ref 8.9–10.3)
Chloride: 97 mmol/L — ABNORMAL LOW (ref 101–111)
Creatinine, Ser: 5.43 mg/dL — ABNORMAL HIGH (ref 0.44–1.00)
GFR calc Af Amer: 8 mL/min — ABNORMAL LOW (ref >60)
GFR calc non Af Amer: 7 mL/min — ABNORMAL LOW (ref >60)
Glucose, Bld: 94 mg/dL (ref 65–99)
Potassium: 3.9 mmol/L (ref 3.5–5.1)
Sodium: 132 mmol/L — ABNORMAL LOW (ref 135–145)

## 2017-03-11 LAB — HEPARIN LEVEL (UNFRACTIONATED)
HEPARIN UNFRACTIONATED: 0.24 [IU]/mL — AB (ref 0.30–0.70)
HEPARIN UNFRACTIONATED: 0.32 [IU]/mL (ref 0.30–0.70)

## 2017-03-11 LAB — HEMOGLOBIN A1C
HEMOGLOBIN A1C: 5.1 % (ref 4.8–5.6)
Mean Plasma Glucose: 100 mg/dL

## 2017-03-11 LAB — VITAMIN D 25 HYDROXY (VIT D DEFICIENCY, FRACTURES): VIT D 25 HYDROXY: 13.8 ng/mL — AB (ref 30.0–100.0)

## 2017-03-11 MED ORDER — CHLORHEXIDINE GLUCONATE 4 % EX LIQD
60.0000 mL | Freq: Once | CUTANEOUS | Status: AC
  Administered 2017-03-11: 4 via TOPICAL
  Filled 2017-03-11: qty 60

## 2017-03-11 MED ORDER — PENICILLIN V POTASSIUM 250 MG PO TABS
500.0000 mg | ORAL_TABLET | Freq: Three times a day (TID) | ORAL | Status: DC
  Administered 2017-03-11 – 2017-03-19 (×21): 500 mg via ORAL
  Filled 2017-03-11 (×2): qty 2
  Filled 2017-03-11: qty 1
  Filled 2017-03-11: qty 2
  Filled 2017-03-11: qty 1
  Filled 2017-03-11: qty 2
  Filled 2017-03-11: qty 1
  Filled 2017-03-11: qty 2
  Filled 2017-03-11: qty 1
  Filled 2017-03-11: qty 2
  Filled 2017-03-11 (×2): qty 1
  Filled 2017-03-11: qty 2
  Filled 2017-03-11 (×2): qty 1
  Filled 2017-03-11: qty 2
  Filled 2017-03-11 (×3): qty 1
  Filled 2017-03-11 (×2): qty 2
  Filled 2017-03-11: qty 1
  Filled 2017-03-11: qty 2
  Filled 2017-03-11 (×2): qty 1

## 2017-03-11 NOTE — Evaluation (Signed)
Called by RN - surgery scheduled for next date:  Clinical/Bedside Swallow Evaluation Patient Details  Name: Monica [name] MRN: [id number] Date of Birth: [birth year]  Today's Date: 03/11/2017 Time: SLP Start Time (ACUTE ONLY): 1120 SLP Stop Time (ACUTE ONLY): 1135 SLP Time Calculation (min) (ACUTE ONLY): 15 min  Past Medical History:  Past Medical History:  Diagnosis Date  . Anxiety   . Arthritis    "all over" (01/29/2017)  . Atrial flutter (HCC)   . Chronic back pain    "all my back" (01/29/2017)  . Constipation   . Dementia   . Depression   . ESRD (end stage renal disease) on dialysis [facility])    "MWF; [facility] Anda" (01/29/2017)  . ETOHism (HCC)    Quit in the 1980s.  . Gastric ulcer 12/2013.   Prepyloric ulcer. On follow-up EGD in 02/2014 this was 90% healed  . GERD (gastroesophageal reflux disease)   . Headache    as a young woman  . Hemorrhoids   . History of kidney stones   . History of stomach ulcers   . Hypertension   . Pancreatic cyst 09/2010.   At uncinate process.  Serial CT imaging favors benign process   Past Surgical History:  Past Surgical History:  Procedure Laterality Date  . ABDOMINAL HYSTERECTOMY    . ARTERIOVENOUS GRAFT PLACEMENT Left 01/29/2017   INSERTION OF ARTERIOVENOUS (AV) GORE-TEX GRAFT  STRETCHED IN LEFT FORE ARM   . AV FISTULA PLACEMENT Left 03/20/2015   Procedure: ARTERIOVENOUS (AV) FISTULA CREATION;  Surgeon: Chuck Hint, MD;  Location: [facility] OR;  Service: Vascular;  Laterality: Left;  . AV FISTULA PLACEMENT Left 01/29/2017   Procedure: INSERTION OF ARTERIOVENOUS (AV) GORE-TEX GRAFT  STRETCHED IN LEFT FORE ARM;  Surgeon: Chuck Hint, MD;  Location: [facility] OR;  Service: Vascular;  Laterality: Left;  . AVGG REMOVAL Left 01/31/2017   Procedure: REMOVAL OF ARTERIOVENOUS GORETEX GRAFT (AVGG) WITH Vein patching.;  Surgeon: Fransisco Hertz, MD;  Location: [facility] OR;  Service: Vascular;  Laterality: Left;  . CATARACT EXTRACTION Right   . COLONOSCOPY   2010   Per Dr. Loreta Ave  . ESOPHAGOGASTRODUODENOSCOPY N/A 12/29/2013   Procedure: ESOPHAGOGASTRODUODENOSCOPY (EGD);  Surgeon: Hart Carwin, MD;  Location: [facility] ENDOSCOPY;  Service: Endoscopy;  Laterality: N/A;  . EXPLORATORY LAPAROTOMY  1980   w/LOA  . FLEXIBLE SIGMOIDOSCOPY  08/2011   Per Dr. Randa Evens. Normal study. Random biopsies positive for trace melanosis coli.  Marland Kitchen FOOT SURGERY Bilateral    "surgery for flat feet"  . INSERTION OF DIALYSIS CATHETER Right 11/25/2016   Procedure: INSERTION OF Right Internal jugular DIALYSIS CATHETER;  Surgeon: Fransisco Hertz, MD;  Location: [facility] OR;  Service: Vascular;  Laterality: Right;  . SHOULDER ARTHROSCOPY W/ ROTATOR CUFF REPAIR Right 08/2003   Hattie Perch 01/29/2017  . TIBIA FRACTURE SURGERY Right    "broke between my knee and my ankle"  . TOTAL ABDOMINAL HYSTERECTOMY W/ BILATERAL SALPINGOOPHORECTOMY  1980  . TUBAL LIGATION     HPI:  83 y.o.femalepresenting with AMS, right hip fx. PMH is significant for HTN, ESRD with HD MWF, Anxiety, Dementia. CXR 4/02: No acute cardiopulmonary abnormality    Assessment / Plan / Recommendation Clinical Impression  Pt presents with functional oropharyngeal swallow with adequate oral prep of purees and thinl liquids; swift swallow response; no overt s/s of aspiration, even when challenged with large, successive boluses of thin liquids.  No dysphagia.  Please note: pt complaining to tooth ache (enduring one week) -  there is edema right upper maxilla;  last molar sensitive to the touch.  Pt declined solid foods due to pain.  Informed RN.  No further SLP f/u needed.  If pt demonstrates decline after surgery, please re-order swallow eval.  Thank you.  SLP Visit Diagnosis: Dysphagia, unspecified (R13.10)    Aspiration Risk  No limitations    Diet Recommendation   regular solids as tolerated; thin liquids  Medication Administration: Whole meds with liquid    Other  Recommendations Oral Care Recommendations: Oral care BID    Follow up Recommendations   n/a     Frequency and Duration            Prognosis        Swallow Study   General Date of Onset: 03/09/17 HPI: 83 y.o.femalepresenting with AMS, right hip fx. PMH is significant for HTN, ESRD with HD MWF, Anxiety, Dementia. CXR 4/02: No acute cardiopulmonary abnormality  Type of Study: Bedside Swallow Evaluation Previous Swallow Assessment: no Diet Prior to this Study: Regular;Thin liquids Temperature Spikes Noted: No Respiratory Status: Room air History of Recent Intubation: No Behavior/Cognition: Alert;Cooperative;Pleasant mood Oral Cavity Assessment: Edema (right upper maxilla - pt c/o toothache; there is edema and i) Oral Care Completed by SLP: Recent completion by staff Oral Cavity - Dentition: Poor condition Vision: Functional for self-feeding Self-Feeding Abilities: Able to feed self Patient Positioning: Partially reclined Baseline Vocal Quality: Normal Volitional Cough: Strong Volitional Swallow: Able to elicit    Oral/Motor/Sensory Function Overall Oral Motor/Sensory Function: Within functional limits   Ice Chips     Thin Liquid Thin Liquid: Within functional limits    Nectar Thick Nectar Thick Liquid: Not tested   Honey Thick Honey Thick Liquid: Not tested   Puree Puree: Within functional limits   Solid   GO   Solid: Not tested (declined due to tooth pain)        Blenda Mounts Laurice 03/11/2017,11:55 AM  Marchelle Folks L. Samson Frederic, [place] CCC/SLP Pager [phone]

## 2017-03-11 NOTE — Progress Notes (Signed)
Subjective:  Denies chest pain or shortness of breath.  Complains of right hip pain.  2-D echo not done yet.  Scheduled for surgery tomorrow  Objective:  Vital Signs in the last 24 hours: Temp:  [98 F (36.7 C)-99.2 F (37.3 C)] 99.2 F (37.3 C) (04/04 0422) Pulse Rate:  [68-109] 93 (04/04 0422) Resp:  [18-23] 18 (04/04 0422) BP: (80-121)/(56-76) 102/60 (04/04 0422) SpO2:  [98 %-99 %] 98 % (04/04 0422) Weight:  [129 lb 3 oz (58.6 kg)] 129 lb 3 oz (58.6 kg) (04/03 1538)  Intake/Output from previous day: 04/03 0701 - 04/04 0700 In: 0  Out: 1000  Intake/Output from this shift: No intake/output data recorded.  Physical Exam: Neck: no adenopathy, no carotid bruit, no JVD and supple, symmetrical, trachea midline Lungs: clear to auscultation bilaterally Heart: regular rate and rhythm, S1, S2 normal and soft systolic murmur noted Abdomen: soft, non-tender; bowel sounds normal; no masses,  no organomegaly Extremities: extremities normal, atraumatic, no cyanosis or edema  Lab Results:  Recent Labs  03/10/17 0717 03/11/17 0437  WBC 9.5 7.7  HGB 10.7* 10.4*  PLT 252 233    Recent Labs  03/10/17 0717 03/11/17 0437  NA 137 132*  K 6.5* 3.9  CL 110 97*  CO2 12* 21*  GLUCOSE 117* 94  BUN 73* 32*  CREATININE 9.11* 5.43*    Recent Labs  03/10/17 0132 03/10/17 0717  TROPONINI 1.33* 1.31*   Hepatic Function Panel  Recent Labs  03/09/17 1407 03/10/17 0132  PROT 7.9  --   ALBUMIN 3.8 3.7  AST 21  --   ALT 15  --   ALKPHOS 67  --   BILITOT 0.7  --    No results for input(s): CHOL in the last 72 hours. No results for input(s): PROTIME in the last 72 hours.  Imaging: Imaging results have been reviewed and Dg Chest 1 View  Result Date: 03/09/2017 CLINICAL DATA:  Fall. EXAM: CHEST 1 VIEW COMPARISON:  Radiograph November 25, 2016. FINDINGS: Stable cardiomediastinal silhouette. Left internal jugular dialysis catheter is unchanged in position. No pneumothorax or pleural  effusion is noted. No acute pulmonary disease is noted. Bony thorax is unremarkable. IMPRESSION: No acute cardiopulmonary abnormality seen. Electronically Signed   By: Lupita Raider, M.D.   On: 03/09/2017 15:23   Dg Lumbar Spine Complete  Result Date: 03/09/2017 CLINICAL DATA:  Low back pain after fall. EXAM: LUMBAR SPINE - COMPLETE 4+ VIEW COMPARISON:  CT scan of August 13, 2016. FINDINGS: Diffuse osteopenia is noted. Atherosclerosis thoracic aorta is noted. No fracture or spondylolisthesis is noted. Mild degenerative disc disease is noted at L3-4, L4-5 and L5-S1. IMPRESSION: Mild multilevel degenerative disc disease. Aortic atherosclerosis. No acute abnormality seen in the lumbar spine. Electronically Signed   By: Lupita Raider, M.D.   On: 03/09/2017 15:19   Dg Shoulder Right  Result Date: 03/09/2017 CLINICAL DATA:  Right shoulder pain after fall. EXAM: RIGHT SHOULDER - 2+ VIEW COMPARISON:  None. FINDINGS: There is no evidence of fracture or dislocation. Mild degenerative changes seen involving right glenohumeral joint. Soft tissues are unremarkable. IMPRESSION: Mild degenerative joint disease of right glenohumeral joint. No acute abnormality seen in the right shoulder. Electronically Signed   By: Lupita Raider, M.D.   On: 03/09/2017 15:16   Ct Head Wo Contrast  Result Date: 03/09/2017 CLINICAL DATA:  Fall with altered mental status EXAM: CT HEAD WITHOUT CONTRAST CT CERVICAL SPINE WITHOUT CONTRAST TECHNIQUE: Multidetector CT imaging of  the head and cervical spine was performed following the standard protocol without intravenous contrast. Multiplanar CT image reconstructions of the cervical spine were also generated. COMPARISON:  07/18/2011 head CT FINDINGS: CT HEAD FINDINGS Brain: No mass lesion, intraparenchymal hemorrhage or extra-axial collection. No evidence of acute cortical infarct. Ventriculomegaly is unchanged compared to the prior examination, likely secondary to chronic volume loss.  Vascular: No hyperdense vessel or unexpected calcification. Skull: Normal visualized skull base, calvarium and extracranial soft tissues. Sinuses/Orbits: No sinus fluid levels or advanced mucosal thickening. No mastoid effusion. Normal orbits. CT CERVICAL SPINE FINDINGS Alignment: No static subluxation. Facets are aligned. Occipital condyles are normally positioned. Skull base and vertebrae: No acute fracture. Soft tissues and spinal canal: No prevertebral fluid or swelling. No visible canal hematoma. Disc levels: Multilevel cervical degenerative disc disease without advanced spinal canal or neural foraminal stenosis. Upper chest: No pneumothorax, pulmonary nodule or pleural effusion. Other: Normal visualized paraspinal cervical soft tissues. IMPRESSION: 1. Chronic volume loss, unchanged, without acute intracranial abnormality. 2. No acute fracture or static subluxation of the cervical spine. Electronically Signed   By: Deatra Robinson M.D.   On: 03/09/2017 16:09   Ct Cervical Spine Wo Contrast  Result Date: 03/09/2017 CLINICAL DATA:  Fall with altered mental status EXAM: CT HEAD WITHOUT CONTRAST CT CERVICAL SPINE WITHOUT CONTRAST TECHNIQUE: Multidetector CT imaging of the head and cervical spine was performed following the standard protocol without intravenous contrast. Multiplanar CT image reconstructions of the cervical spine were also generated. COMPARISON:  07/18/2011 head CT FINDINGS: CT HEAD FINDINGS Brain: No mass lesion, intraparenchymal hemorrhage or extra-axial collection. No evidence of acute cortical infarct. Ventriculomegaly is unchanged compared to the prior examination, likely secondary to chronic volume loss. Vascular: No hyperdense vessel or unexpected calcification. Skull: Normal visualized skull base, calvarium and extracranial soft tissues. Sinuses/Orbits: No sinus fluid levels or advanced mucosal thickening. No mastoid effusion. Normal orbits. CT CERVICAL SPINE FINDINGS Alignment: No static  subluxation. Facets are aligned. Occipital condyles are normally positioned. Skull base and vertebrae: No acute fracture. Soft tissues and spinal canal: No prevertebral fluid or swelling. No visible canal hematoma. Disc levels: Multilevel cervical degenerative disc disease without advanced spinal canal or neural foraminal stenosis. Upper chest: No pneumothorax, pulmonary nodule or pleural effusion. Other: Normal visualized paraspinal cervical soft tissues. IMPRESSION: 1. Chronic volume loss, unchanged, without acute intracranial abnormality. 2. No acute fracture or static subluxation of the cervical spine. Electronically Signed   By: Deatra Robinson M.D.   On: 03/09/2017 16:09   Dg Knee Right Port  Result Date: 03/10/2017 CLINICAL DATA:  Fracture of the right femoral neck EXAM: PORTABLE RIGHT KNEE - 1-2 VIEW COMPARISON:  Pelvis radiograph 03/09/2017 FINDINGS: Lateral views are limited by patient positioning. No gross effusion. Alignment grossly within normal limits. Moderate-to-marked narrowing of the medial and lateral joint space compartments. Disc space calcification laterally. IMPRESSION: 1. No acute osseous abnormality 2. Moderate-to-marked arthritis. 3. Chondrocalcinosis Electronically Signed   By: Jasmine Pang M.D.   On: 03/10/2017 18:56   Dg Hip Unilat With Pelvis 2-3 Views Right  Result Date: 03/09/2017 CLINICAL DATA:  Right hip pain after fall. EXAM: DG HIP (WITH OR WITHOUT PELVIS) 2-3V RIGHT COMPARISON:  None. FINDINGS: Severely displaced right proximal femoral neck fracture is noted. Femoral head is well situated within the acetabulum. Mild degenerative joint disease is noted bilaterally. IMPRESSION: Severely displaced proximal right femoral neck fracture. Electronically Signed   By: Lupita Raider, M.D.   On: 03/09/2017 15:21  Cardiac Studies:  Assessment/Plan:  Status post fall sustaining right femoral neck fracture Status post recent small non-Q-wave myocardial  infarction Hypertension End-stage renal disease on hemodialysis History of paroxysmal A. fib flutter History of peptic ulcer disease GERD Anemia of chronic disease Elevated blood sugar rule out diabetes mellitus Depression Anxiety disorder Dementia Plan Continue present management. Check 2-D echo Tentatively scheduled for surgery tomorrow  LOS: 2 days    Rinaldo Cloud 03/11/2017, 12:03 PM

## 2017-03-11 NOTE — Progress Notes (Addendum)
Initial Nutrition Assessment  DOCUMENTATION CODES:   Non-severe (moderate) malnutrition in context of chronic illness  INTERVENTION:    Advance diet as medically appropriate, add supplements when/as able  NUTRITION DIAGNOSIS:   Malnutrition (Moderate ) related to chronic illness (ESRD) as evidenced by moderate depletion of body fat, moderate depletions of muscle mass  GOAL:   Patient will meet greater than or equal to 90% of their needs  MONITOR:   Diet advancement, PO intake, Supplement acceptance, Labs, Weight trends, Skin, I & O's  REASON FOR ASSESSMENT:   Consult Assessment of nutrition requirement/status  ASSESSMENT:   83 y.o. Female with ESRD on MWF dialysis at [facility] since November 2017 who missed HD yesterday due to a fall with presentation to the ED with new hip fx.  PMHx is significant for PAF not on anicoag due to hx of GIB, etoh abuse in the 1980s, anxiety/depression/dementia.   RD spoke with pt at bedside. Reports she wasn't eating very well PTA.  Typically consumes 2 meals per day. Reveals that milk based nutrition supplements "tear her stomach up". Swallow evaluation pending per Speech Path.  Also states she's lost about 30 lbs since October 2017.   Medications reviewed and include RENA-VIT and PHOSLO. Labs reviewed.  Sodium 132 (L).  Vitamin D 13.8 (L). Plan for surgery 03/12/17.  Nutrition-Focused physical exam completed. Findings are moderate fat depletion, moderate muscle depletion, and no edema.   Diet Order:  Diet NPO time specified Except for: Sips with Meds Diet renal with fluid restriction Fluid restriction: 1200 mL Fluid; Room service appropriate? Yes; Fluid consistency: Thin  Skin:  Reviewed, no issues  Last BM:  4/3  Height:   Ht Readings from Last 1 Encounters:  03/11/17  (1.549 m)   Weight:   Wt Readings from Last 1 Encounters:  03/10/17 129 lb 3 oz (58.6 kg)   Wt Readings from Last 15 Encounters:  03/10/17 129 lb 3 oz (58.6  kg)  02/20/17 133 lb (60.3 kg)  02/02/17 132 lb (59.9 kg)  02/01/17 123 lb 9.6 oz (56.1 kg)  01/29/17 136 lb 3.2 oz (61.8 kg)  01/20/17 130 lb (59 kg)  12/09/16 127 lb (57.6 kg)  11/25/16 127 lb (57.6 kg)  10/28/16 125 lb 3.5 oz (56.8 kg)  10/28/16 125 lb 3.2 oz (56.8 kg)  10/19/16 123 lb (55.8 kg)  08/13/16 123 lb (55.8 kg)  01/03/16 167 lb 9.6 oz (76 kg)  03/20/15 174 lb 9.7 oz (79.2 kg)  02/22/14 161 lb (73 kg)   Ideal Body Weight:  45.4 kg  BMI:  Body mass index is 24.41 kg/m.  Estimated Nutritional Needs:   Kcal:  1500-1700  Protein:  70-80 gm  Fluid:  1.5-1.7 L  EDUCATION NEEDS:   No education needs identified at this time  Maureen Chatters, RD, LDN Pager #: [phone] After-Hours Pager #: [phone]

## 2017-03-11 NOTE — Progress Notes (Signed)
Family Medicine Teaching Service Daily Progress Note Intern Pager: [phone]  Patient name: Monica [name] Medical record number: [id number] Date of birth: 1932/01/23 Age: 83 y.o. Gender: female  Primary Care Provider: No PCP Per Patient Consultants: Cardiology Code Status: Full  Chief Complaint: AMS and right hip pain  Assessment and Plan: Monica [name] is a 83 y.o. female presenting with AMS and right hip pain . PMH is significant for HTN, ESRD with HD MWF, Anxiety, Dementia  STEMI: EKG on admit with Q waves in V1 to V3 and ST elevation in anterior leads, repeat reassuring with normalization of R wave progression. Troponins elevated to peak 1.31.  -Per Cardiology, 2D echo prior to surgery and if LV function appears in normal range patient is willing to proceed with high risk surgery. Patient will need cardiac evaluation in the future. Appreciate recommendations -Telemetry -ASA 81 -Heparin per pharmacy -Lopressor 50 mg BID -Atorvastatin 40 mg   Right Femoral Neck Fracture: Likely from mechanical fall, unwitnessed. Right hip XRAY showing severely displaced right femoral neck fracture. Surgery would be high risk given active cardiac issue. -Per Orthopedics, awaiting cardiac clearance for R hip hemiarthroplasty. Appreciate recommendations. -Tylenol 650 mg q6h prn mild pain and fentanyl 50 g q2h prn for breakthrough - vitamin D 25-Hydroxy level low 13.8, continue calcitriol supplementation. - nutrition consult - avoid PPI, continue H2 blocker instead   ESRD: HD on MWF - Continue home phoslo - Nephrology following, HD today - Daily renal function panel  Dementia: Baseline dementia per chart review as well as per discussion with her neighbor. Has some hallucinations as well as some lip tremor and hand and feet tremors,?Lewy body dementia - Continue to monitor mental status  Depression and Anxiety:  - Continue home Effexor and Xanax  Acute toxic metabolic encephalopathy, resolved:   Multifactorial likely in setting of missed dialysis prior to admission - TSH WNL at 4.2 - monitor mental status    FEN/GI: NPO for possible surgical intervention today Prophylaxis: Heparin gtt  Disposition: pending surgery  Subjective:  No acute events overnight. Patient is oriented and doing well this morning. States pain is well controlled at rest but is severe when she moves i.e. getting on bedpan. Denies CP, SOB.  Objective: Temp:  [98 F (36.7 C)-99.2 F (37.3 C)] 99.2 F (37.3 C) (04/04 0422) Pulse Rate:  [68-109] 93 (04/04 0422) Resp:  [18-24] 18 (04/04 0422) BP: (80-169)/(56-104) 102/60 (04/04 0422) SpO2:  [98 %-99 %] 98 % (04/04 0422) Weight:  [58.6 kg (129 lb 3 oz)-59.6 kg (131 lb 6.3 oz)] 58.6 kg (129 lb 3 oz) (04/03 1538) Physical Exam: General: NAD, resting comfortably in bed Cardiovascular: RRR, no m/r/g Respiratory: CTAB, no W/R/R Abdomen: nontender, nondistended Extremities: R hip tenderness, 1+ pitting edema to knees that per patient is her baseline Neuro: alert and oriented  Laboratory:  Recent Labs Lab 03/09/17 1407 03/10/17 0717 03/11/17 0437  WBC 10.2 9.5 7.7  HGB 11.4* 10.7* 10.4*  HCT 36.1 33.5* 32.3*  PLT 276 252 233    Recent Labs Lab 03/09/17 1407 03/10/17 0132 03/10/17 0717 03/11/17 0437  NA 137 138 137 132*  K 5.8* 6.1* 6.5* 3.9  CL 108 111 110 97*  CO2 14* 11* 12* 21*  BUN 67* 72* 73* 32*  CREATININE 9.24* 9.22* 9.11* 5.43*  CALCIUM 9.6 9.6 9.2 8.3*  PROT 7.9  --   --   --   BILITOT 0.7  --   --   --   Jolly Mango  67  --   --   --   ALT 15  --   --   --   AST 21  --   --   --   GLUCOSE 154* 127* 117* 94      Imaging/Diagnostic Tests: Dg Knee Right Port  Result Date: 03/10/2017 CLINICAL DATA:  Fracture of the right femoral neck EXAM: PORTABLE RIGHT KNEE - 1-2 VIEW COMPARISON:  Pelvis radiograph 03/09/2017 FINDINGS: Lateral views are limited by patient positioning. No gross effusion. Alignment grossly within normal limits.  Moderate-to-marked narrowing of the medial and lateral joint space compartments. Disc space calcification laterally. IMPRESSION: 1. No acute osseous abnormality 2. Moderate-to-marked arthritis. 3. Chondrocalcinosis Electronically Signed   By: Jasmine Pang M.D.   On: 03/10/2017 18:56     Leland Her, DO 03/11/2017, 6:15 AM PGY-1, [facility] FPTS Intern pager: [phone], text pages welcome

## 2017-03-11 NOTE — Progress Notes (Signed)
ANTICOAGULATION CONSULT NOTE - Follow Up Consult  Pharmacy Consult for Heparin Indication: chest pain/ACS  Allergies  Allergen Reactions  . Aspirin Other (See Comments)    "makes my stomach hurt"  . Codeine Other (See Comments)    "i get irritated"    Patient Measurements: Height: 5' (152.4 cm) Weight: 129 lb 3 oz (58.6 kg) IBW/kg (Calculated) : 45.5 Heparin Dosing Weight:  57.5 kg  Vital Signs: Temp: 99.2 F (37.3 C) (04/04 0422) Temp Source: Oral (04/04 0422) BP: 102/60 (04/04 0422) Pulse Rate: 93 (04/04 0422)  Labs:  Recent Labs  03/09/17 1407 03/09/17 2126  03/10/17 0132 03/10/17 0717 03/10/17 0901 03/11/17 0437  HGB 11.4*  --   --   --  10.7*  --  10.4*  HCT 36.1  --   --   --  33.5*  --  32.3*  PLT 276  --   --   --  252  --  233  HEPARINUNFRC  --   --   < > 0.70 >2.20* 0.59 0.24*  CREATININE 9.24*  --   --  9.22* 9.11*  --  5.43*  CKTOTAL 170  --   --   --   --   --   --   TROPONINI  --  1.01*  --  1.33* 1.31*  --   --   < > = values in this interval not displayed.  Estimated Creatinine Clearance: 6.4 mL/min (A) (by C-G formula based on SCr of 5.43 mg/dL (H)).   Assessment:  Anticoag: Heparin for NSTEMI. Heparin level now down to subtherapeutic (0.24) on gtt at 750 units/hr. CBC stable. No issues with line or bleeding reported per RN.  Goal of Therapy:  Heparin level 0.3-0.7 units/ml Monitor platelets by anticoagulation protocol: Yes   Plan:  Heparin levels require foot sticks Increase IV heparin to 850 units/hr Will f/u heparin level in 8 hours  Christoper Fabian, PharmD, BCPS Clinical pharmacist, pager [phone] 03/11/2017,5:45 AM

## 2017-03-11 NOTE — Progress Notes (Signed)
Surgery was tentatively planned for today. Received call from floor stating that patient ate lunch. TTE still pending. Spoke with Dr. Arlean Hopping, who states patient will receive dialysis Thursday and Saturday. Will plan for surgery Friday.

## 2017-03-11 NOTE — Progress Notes (Signed)
  [facility] Progress Note   Subjective: no c/o  Vitals:   03/10/17 1538 03/10/17 2138 03/11/17 0422 03/11/17 1050  BP: 120/75 121/73 102/60   Pulse: 100 (!) 109 93   Resp: Temp: 98 F (36.7 C) 99.1 F (37.3 C) 99.2 F (37.3 C)   TempSrc: Axillary Oral Oral   SpO2: 99% 99% 98%   Weight: 58.6 kg (129 lb 3 oz)     Height:     (1.549 m)    Inpatient medications: . ALPRAZolam  0.5 mg Oral BID  . aspirin EC  81 mg Oral Daily  . atorvastatin  40 mg Oral q1800  . calcitRIOL  0.75 mcg Oral Q M,W,F-HD  . calcium acetate  667 mg Oral TID WC  . [START ON 03/16/2017] darbepoetin (ARANESP) injection - DIALYSIS  100 mcg Intravenous Q Mon-HD  . famotidine  20 mg Oral Daily  . ferric gluconate (FERRLECIT/NULECIT) IV  125 mg Intravenous Q M,W,F-HD  . metoprolol tartrate  25 mg Oral BID  . multivitamin  1 tablet Oral QHS  . nitroGLYCERIN  0.5 inch Topical Q6H  . sodium chloride flush  3 mL Intravenous Q12H  . venlafaxine XR  37.5 mg Oral QHS   . heparin 850 Units/hr (03/11/17 8295)   acetaminophen **OR** acetaminophen, calcium acetate, fentaNYL (SUBLIMAZE) injection, nitroGLYCERIN, polyethylene glycol, tetrahydrozoline  Exam: Alert, no distress, Ox 3 No jvd Chest clear bilat RRR no mrg ABd soft ntnd Ext no LE edema L IJ cath  Dialysis: [facility] MWF  4h  59kg  2/2 bath Hep 5000  L IJ - M75 ug last 3/14 - last Hb 10, tsat 175 ferr 346 pth 1022      Assessment: 1. AMS - improved, back to baseline , mild dementia 2. R hip fx - per ortho 3. +trop - per primary 4. ESRD HD MWF 5. Volume - is at dry wt 6. Anemia of CKD - cont fe/ esa 7. MBD - cont meds  Plan - HD tomorrow off schedule   Vinson Moselle MD [facility] pager [phone]   03/11/2017, 12:03 PM    Recent Labs Lab 03/10/17 0132 03/10/17 0717 03/11/17 0437  NA 138 137 132*  K 6.1* 6.5* 3.9  CL 111 110 97*  CO2 11* 12* 21*  GLUCOSE 127* 117* 94  BUN 72* 73* 32*   CREATININE 9.22* 9.11* 5.43*  CALCIUM 9.6 9.2 8.3*  PHOS 6.7*  --   --     Recent Labs Lab 03/09/17 1407 03/10/17 0132  AST 21  --   ALT 15  --   ALKPHOS 67  --   BILITOT 0.7  --   PROT 7.9  --   ALBUMIN 3.8 3.7    Recent Labs Lab 03/09/17 1407 03/10/17 0717 03/11/17 0437  WBC 10.2 9.5 7.7  NEUTROABS 9.2*  --   --   HGB 11.4* 10.7* 10.4*  HCT 36.1 33.5* 32.3*  MCV 78.0 77.2* 75.1*  PLT 276 252 233   Iron/TIBC/Ferritin/ %Sat    Component Value Date/Time   IRON 74 10/23/2016 1405   TIBC 231 (L) 10/23/2016 1405   FERRITIN 159 10/23/2016 1405   IRONPCTSAT 32 (H) 10/23/2016 1405

## 2017-03-11 NOTE — Progress Notes (Signed)
ANTICOAGULATION CONSULT NOTE - Follow Up Consult  Pharmacy Consult for Heparin Indication: chest pain/ACS  Allergies  Allergen Reactions  . Aspirin Other (See Comments)    "makes my stomach hurt"  . Codeine Other (See Comments)    "i get irritated"    Patient Measurements: Height:  (154.9 cm) Weight: 129 lb 3 oz (58.6 kg) IBW/kg (Calculated) : 47.8 Heparin Dosing Weight:  57.5 kg  Vital Signs: Temp: 98.3 F (36.8 C) (04/04 1359) Temp Source: Oral (04/04 1359) BP: 106/54 (04/04 1359) Pulse Rate: 94 (04/04 1359)  Labs:  Recent Labs  03/09/17 1407 03/09/17 2126  03/10/17 0132 03/10/17 0717 03/10/17 0901 03/11/17 0437 03/11/17 1544  HGB 11.4*  --   --   --  10.7*  --  10.4*  --   HCT 36.1  --   --   --  33.5*  --  32.3*  --   PLT 276  --   --   --  252  --  233  --   HEPARINUNFRC  --   --   < > 0.70 >2.20* 0.59 0.24* 0.32  CREATININE 9.24*  --   --  9.22* 9.11*  --  5.43*  --   CKTOTAL 170  --   --   --   --   --   --   --   TROPONINI  --  1.01*  --  1.33* 1.31*  --   --   --   < > = values in this interval not displayed.  Estimated Creatinine Clearance: 6.6 mL/min (A) (by C-G formula based on SCr of 5.43 mg/dL (H)).   Assessment: Heparin for NSTEMI. Heparin level now therapeutic (0.32) on gtt at 850 units/hr. CBC stable. No issues with line or bleeding reported per RN.  Patient on hemodialysis MWF, planning to get off schedule tomorrow as well.  Goal of Therapy:  Heparin level 0.3-0.7 units/ml Monitor platelets by anticoagulation protocol: Yes   Plan:  Heparin levels require IV team Continue IV heparin at 850 units/hr Check anti-Xa level in 8 hours and daily while on heparin Continue to monitor H&H and platelets   Thank you for allowing [place] to participate in this patients care. Signe Colt, PharmD Pager: [phone]

## 2017-03-11 NOTE — Progress Notes (Signed)
Patient seen and evaluated. She complains of right hip pain. She states that she normally walks with a walker.  She is alert and oriented 3 Examination of the right lower extremity reveals that she is shortened and externally rotated. She has pain with attempted range of motion. She has palpable pulses. She has positive motor function dorsiflexion, plantarflexion, and great toe extension. She reports intact sensation.  Assessment: Displaced right femoral neck fracture  Treatment plan: We discussed right hip hemiarthroplasty versus total hip arthroplasty for pain control and mobilization. We discussed the risks, benefits, and alternatives. We will plan for surgery on Friday. Her cardiac workup is underway. She is on hemodialysis schedule for tomorrow. She will be nothing by mouth after midnight on Thursday night. Her heparin drip will need to be discontinued at 7 AM on Friday morning.      The risks, benefits, and alternatives were discussed with the patient. There are risks associated with the surgery including, but not limited to, problems with anesthesia (death), infection, instability (giving out of the joint), dislocation, differences in leg length/angulation/rotation, fracture of bones, loosening or failure of implants, hematoma (blood accumulation) which may require surgical drainage, blood clots, pulmonary embolism, nerve injury (foot drop and lateral thigh numbness), and blood vessel injury. The patient understands these risks and elects to proceed.

## 2017-03-11 NOTE — Progress Notes (Signed)
SLP Cancellation Note  Patient Details Name: Monica [name] MRN: [id number] DOB: [birth year]   Cancelled treatment:       Reason Eval/Treat Not Completed: Other (comment) NPO for surgery.  Will f/u next date.   Blenda Mounts Laurice 03/11/2017, 11:04 AM

## 2017-03-12 ENCOUNTER — Other Ambulatory Visit (HOSPITAL_COMMUNITY): Payer: Medicare Other

## 2017-03-12 LAB — RENAL FUNCTION PANEL
ANION GAP: 13 (ref 5–15)
Albumin: 3 g/dL — ABNORMAL LOW (ref 3.5–5.0)
BUN: 46 mg/dL — ABNORMAL HIGH (ref 6–20)
CALCIUM: 8.6 mg/dL — AB (ref 8.9–10.3)
CO2: 19 mmol/L — AB (ref 22–32)
Chloride: 100 mmol/L — ABNORMAL LOW (ref 101–111)
Creatinine, Ser: 7.04 mg/dL — ABNORMAL HIGH (ref 0.44–1.00)
GFR calc Af Amer: 6 mL/min — ABNORMAL LOW (ref >60)
GFR calc non Af Amer: 5 mL/min — ABNORMAL LOW (ref >60)
GLUCOSE: 95 mg/dL (ref 65–99)
POTASSIUM: 4.5 mmol/L (ref 3.5–5.1)
Phosphorus: 6.4 mg/dL — ABNORMAL HIGH (ref 2.5–4.6)
SODIUM: 132 mmol/L — AB (ref 135–145)

## 2017-03-12 LAB — CBC
HCT: 31.2 % — ABNORMAL LOW (ref 36.0–46.0)
HEMOGLOBIN: 9.9 g/dL — AB (ref 12.0–15.0)
MCH: 24 pg — AB (ref 26.0–34.0)
MCHC: 31.7 g/dL (ref 30.0–36.0)
MCV: 75.5 fL — ABNORMAL LOW (ref 78.0–100.0)
Platelets: 252 10*3/uL (ref 150–400)
RBC: 4.13 MIL/uL (ref 3.87–5.11)
RDW: 15.9 % — ABNORMAL HIGH (ref 11.5–15.5)
WBC: 8.2 10*3/uL (ref 4.0–10.5)

## 2017-03-12 LAB — HEPARIN LEVEL (UNFRACTIONATED)
Heparin Unfractionated: 0.23 IU/mL — ABNORMAL LOW (ref 0.30–0.70)
Heparin Unfractionated: 0.34 IU/mL (ref 0.30–0.70)

## 2017-03-12 MED ORDER — NEPRO/CARBSTEADY PO LIQD
237.0000 mL | Freq: Two times a day (BID) | ORAL | Status: DC
Start: 2017-03-12 — End: 2017-03-19
  Administered 2017-03-12 – 2017-03-18 (×8): 237 mL via ORAL
  Filled 2017-03-12 (×18): qty 237

## 2017-03-12 MED ORDER — LIDOCAINE HCL (PF) 1 % IJ SOLN: 5.0000 mL | INTRAMUSCULAR | Status: DC | PRN

## 2017-03-12 MED ORDER — SODIUM CHLORIDE 0.9 % IV SOLN: 100.0000 mL | INTRAVENOUS | Status: DC | PRN

## 2017-03-12 MED ORDER — TRANEXAMIC ACID 1000 MG/10ML IV SOLN
1000.0000 mg | INTRAVENOUS | Status: DC
  Filled 2017-03-12: qty 10

## 2017-03-12 MED ORDER — CHLORHEXIDINE GLUCONATE 4 % EX LIQD: 60.0000 mL | Freq: Once | CUTANEOUS | Status: AC

## 2017-03-12 MED ORDER — CEFAZOLIN SODIUM-DEXTROSE 2-4 GM/100ML-% IV SOLN
2.0000 g | INTRAVENOUS | Status: DC
  Filled 2017-03-12 (×2): qty 100

## 2017-03-12 MED ORDER — ALTEPLASE 2 MG IJ SOLR
2.0000 mg | Freq: Once | INTRAMUSCULAR | Status: DC | PRN
Start: 2017-03-12 — End: 2017-03-12

## 2017-03-12 MED ORDER — LIDOCAINE-PRILOCAINE 2.5-2.5 % EX CREA: 1.0000 "application " | TOPICAL_CREAM | CUTANEOUS | Status: DC | PRN

## 2017-03-12 MED ORDER — HEPARIN SODIUM (PORCINE) 1000 UNIT/ML DIALYSIS: 1000.0000 [IU] | INTRAMUSCULAR | Status: DC | PRN

## 2017-03-12 MED ORDER — CHLORHEXIDINE GLUCONATE 4 % EX LIQD
60.0000 mL | Freq: Once | CUTANEOUS | Status: AC
  Administered 2017-03-13: 4 via TOPICAL
  Filled 2017-03-12: qty 15
  Filled 2017-03-12: qty 60

## 2017-03-12 MED ORDER — ACETAMINOPHEN 325 MG PO TABS
ORAL_TABLET | ORAL | Status: AC
  Administered 2017-03-12: 650 mg via ORAL
  Filled 2017-03-12: qty 2

## 2017-03-12 MED ORDER — PENTAFLUOROPROP-TETRAFLUOROETH EX AERO: 1.0000 "application " | INHALATION_SPRAY | CUTANEOUS | Status: DC | PRN

## 2017-03-12 MED ORDER — ACETAMINOPHEN 10 MG/ML IV SOLN
1000.0000 mg | INTRAVENOUS | Status: AC
  Administered 2017-03-13: 1000 mg via INTRAVENOUS
  Filled 2017-03-12 (×2): qty 100

## 2017-03-12 NOTE — Progress Notes (Signed)
ANTICOAGULATION CONSULT NOTE - Follow Up Consult  Pharmacy Consult for Heparin Indication: chest pain/ACS  Allergies  Allergen Reactions  . Aspirin Other (See Comments)    "makes my stomach hurt"  . Codeine Other (See Comments)    "i get irritated"    Patient Measurements: Height:  (154.9 cm) Weight: 129 lb 3 oz (58.6 kg) IBW/kg (Calculated) : 47.8 Heparin Dosing Weight:  57.5 kg  Vital Signs: Temp: 99.6 F (37.6 C) (04/04 2235) Temp Source: Oral (04/04 2235) BP: 120/71 (04/04 2235) Pulse Rate: 100 (04/04 2235)  Labs:  Recent Labs  03/09/17 1407 03/09/17 2126  03/10/17 0132 03/10/17 0717  03/11/17 0437 03/11/17 1544 03/12/17 0312  HGB 11.4*  --   --   --  10.7*  --  10.4*  --  9.9*  HCT 36.1  --   --   --  33.5*  --  32.3*  --  31.2*  PLT 276  --   --   --  252  --  233  --  252  HEPARINUNFRC  --   --   < > 0.70 >2.20*  < > 0.24* 0.32 0.23*  CREATININE 9.24*  --   --  9.22* 9.11*  --  5.43*  --  7.04*  CKTOTAL 170  --   --   --   --   --   --   --   --   TROPONINI  --  1.01*  --  1.33* 1.31*  --   --   --   --   < > = values in this interval not displayed.  Estimated Creatinine Clearance: 5.1 mL/min (A) (by C-G formula based on SCr of 7.04 mg/dL (H)).   Assessment: Heparin for NSTEMI. Heparin level down to subtherapeutic (0.23) on gtt at 850 units/hr. Hgb down a bit, plt wnl. No issues with line or bleeding reported per RN.  Patient on hemodialysis MWF, planning to get off schedule tomorrow as well.  Goal of Therapy:  Heparin level 0.3-0.7 units/ml Monitor platelets by anticoagulation protocol: Yes   Plan:  Heparin levels require IV team Increase IV heparin to 950 units/hr Check anti-Xa level in 8 hours  Christoper Fabian, PharmD, BCPS Clinical pharmacist, pager [phone] 03/12/2017 4:21 AM

## 2017-03-12 NOTE — Progress Notes (Signed)
ANTICOAGULATION CONSULT NOTE - Follow Up Consult  Pharmacy Consult for Heparin Indication: NSTEMI   Allergies  Allergen Reactions  . Aspirin Other (See Comments)    "makes my stomach hurt"  . Codeine Other (See Comments)    "i get irritated"    Patient Measurements: Height:  (154.9 cm) Weight: 129 lb 3 oz (58.6 kg) IBW/kg (Calculated) : 47.8 Heparin Dosing Weight:   Vital Signs: Temp: 98.5 F (36.9 C) (04/05 0831) Temp Source: Oral (04/05 0831) BP: 147/77 (04/05 0831) Pulse Rate: 98 (04/05 0831)  Labs:  Recent Labs  03/09/17 1407 03/09/17 2126  03/10/17 0132 03/10/17 0717  03/11/17 0437 03/11/17 1544 03/12/17 0312 03/12/17 1202  HGB 11.4*  --   --   --  10.7*  --  10.4*  --  9.9*  --   HCT 36.1  --   --   --  33.5*  --  32.3*  --  31.2*  --   PLT 276  --   --   --  252  --  233  --  252  --   HEPARINUNFRC  --   --   < > 0.70 >2.20*  < > 0.24* 0.32 0.23* 0.34  CREATININE 9.24*  --   --  9.22* 9.11*  --  5.43*  --  7.04*  --   CKTOTAL 170  --   --   --   --   --   --   --   --   --   TROPONINI  --  1.01*  --  1.33* 1.31*  --   --   --   --   --   < > = values in this interval not displayed.  Estimated Creatinine Clearance: 5.1 mL/min (A) (by C-G formula based on SCr of 7.04 mg/dL (H)).   Assessment:  Anticoag: Heparin for NSTEMI. Hgb 11.4>10.7>10.4. HL 0.23 down this AM. Repeat HL 0.34 now in goal.  Goal of Therapy:  HL 0.3-0.7 Monitor platelets by anticoagulation protocol: Yes   Plan:  Continue IV heparin at 950 units/hr Daily HL and CBC Heparin off in AM for surgery  Marquavious Nazar S. Merilynn Finland, PharmD, BCPS Clinical Staff Pharmacist Pager [phone]  Misty Stanley Stillinger 03/12/2017,1:11 PM

## 2017-03-12 NOTE — Progress Notes (Signed)
Family Medicine Teaching Service Daily Progress Note Intern Pager: [phone]  Patient name: Monica [name] Medical record number: [id number] Date of birth: February 23, 1932 Age: 83 y.o. Gender: female  Primary Care Provider: No PCP Per Patient Consultants: Cardiology Code Status: Full  Chief Complaint: AMS and right hip pain  Assessment and Plan: Monica [name] is a 83 y.o. female presenting with AMS and right hip pain . PMH is significant for HTN, ESRD with HD MWF, Anxiety, Dementia  STEMI: EKG on admit with Q waves in V1 to V3 and ST elevation in anterior leads, repeat reassuring with normalization of R wave progression. Troponins elevated to peak 1.31.  -Per Cardiology, 2D echo prior to surgery and if LV function appears in normal range patient is willing to proceed with high risk surgery. Patient will need cardiac evaluation in the future. Appreciate recommendations  - Echo today -Telemetry -ASA 81 -Heparin per pharmacy -Lopressor 50 mg BID -Atorvastatin 40 mg   Right Femoral Neck Fracture: Likely from mechanical fall, unwitnessed. Right hip XRAY showing severely displaced right femoral neck fracture. Surgery would be high risk given active cardiac issue.  -Per Orthopedics, awaiting cardiac clearance, surgery planned for 4/6. Heparin drip would need to be discontinued by 7am that morning. Appreciate recommendations. -Tylenol 650 mg q6h prn mild pain and fentanyl 50 g q2h prn for breakthrough - vitamin D 25-Hydroxy level low 13.8, continue calcitriol supplementation. - nutrition consult - avoid PPI, continue H2 blocker instead   ESRD: HD on MWF - Continue home phoslo - Nephrology following, HD off schedule today  - Daily renal function panel  Anemia  Baseline ~11. Last iron panel 10/2016 that showed adequate iron, folate, B12, ferritin. Likely multifactorial from ESRD and R hip fracture. Since admission hgb trending down 11.4>10.7>10.4>9.9 - monitor CBC  Dementia: Baseline dementia  per chart review as well as per discussion with her neighbor. Has some hallucinations as well as some lip tremor and hand and feet tremors,?Lewy body dementia - Continue to monitor mental status  Depression and Anxiety:  - Continue home Effexor and Xanax  Toothache Per speech evaluation, edema R upper maxilla with last molar sensitive to touch. - penicillin V - may consider panograph to evaluate further  Acute toxic metabolic encephalopathy, resolved:  Multifactorial likely in setting of missed dialysis prior to admission - TSH WNL at 4.2 - monitor mental status    FEN/GI: renal diet Prophylaxis: Heparin gtt  Disposition: pending surgery  Subjective:  No acute events overnight. Patient is oriented and doing well this morning. States R hip pain continues to be well controlled at rest but is severe when she moves i.e. getting on bedpan. Denies CP, SOB. Her toothache is in R upper molar and is not improved after starting antibiotics but also not worsened, it is causing her difficulty with eating.  Objective: Temp:  [98.3 F (36.8 C)-99.6 F (37.6 C)] 98.6 F (37 C) (04/05 0518) Pulse Rate:  [93-100] 96 (04/05 0518) Resp:  [18-20] 20 (04/05 0518) BP: (106-136)/(54-107) 127/107 (04/05 0518) SpO2:  [96 %-99 %] 99 % (04/05 0518) Physical Exam: General: NAD, resting comfortably in bed Cardiovascular: RRR, no m/r/g Respiratory: CTAB, no W/R/R Abdomen: nontender, nondistended Extremities: R hip tenderness, 1+ pitting edema to knees that per patient is her baseline Neuro: alert and oriented  Laboratory:  Recent Labs Lab 03/10/17 0717 03/11/17 0437 03/12/17 0312  WBC 9.5 7.7 8.2  HGB 10.7* 10.4* 9.9*  HCT 33.5* 32.3* 31.2*  PLT 252 233 252  Recent Labs Lab 03/09/17 1407  03/10/17 0717 03/11/17 0437 03/12/17 0312  NA 137  < > 137 132* 132*  K 5.8*  < > 6.5* 3.9 4.5  CL 108  < > 110 97* 100*  CO2 14*  < > 12* 21* 19*  BUN 67*  < > 73* 32* 46*  CREATININE 9.24*   < > 9.11* 5.43* 7.04*  CALCIUM 9.6  < > 9.2 8.3* 8.6*  PROT 7.9  --   --   --   --   BILITOT 0.7  --   --   --   --   ALKPHOS 67  --   --   --   --   ALT 15  --   --   --   --   AST 21  --   --   --   --   GLUCOSE 154*  < > 117* 94 95  < > = values in this interval not displayed.    Imaging/Diagnostic Tests: No results found.   Leland Her, DO 03/12/2017, 6:38 AM PGY-1,  Family Medicine FPTS Intern pager: [phone], text pages welcome

## 2017-03-12 NOTE — Discharge Summary (Signed)
Family Medicine Teaching [facility] Discharge Summary  Patient name: Monica [name] Medical record number: [id number] Date of birth: 08/29/1932 Age: 83 y.o. Gender: female Date of Admission: 03/09/2017  Date of Discharge: 03/19/2017 Admitting Physician: Leighton Roach McDiarmid, MD  Primary Care Provider: No PCP Per Patient Consultants: Cardiology, Orthopedics  Indication for Hospitalization: AMS and R hip fracture   Discharge Diagnoses/Problem List:  STEMI R hip femoral neck fracture s/p R hip hemiarthroplasty  ESRD Anemia Depression Anxiety Toothache Acute toxic metabolic encephalopathy, resolved  Disposition: SNF  Discharge Condition: Stable, improved  Discharge Exam:  General: NAD, resting comfortably in bed Cardiovascular: RRR, no m/r/g Respiratory: CTAB, no W/R/R Abdomen: nontender, nondistended Extremities: R hip with bandage in place, no edema Skin: sacrum covered with dressing c/d/i Neuro: alert and oriented  Brief Hospital Course:  Monica [name] is a 83 y.o. female with PMH significant for HTN, ESRD with HD MWF, Anxiety, Dementia who presented with AMS and right hip pain.  Patient presented in altered mental status after a missed dialysis session and on admission was found to be hypertensive with EKG findings showing new Q waves in V1 to V3 as well as signs of possible ST elevation in anterior septal leads and troponin elevated to 0.86. Patient had a unwitnessed likely mechanical fall and was found to have a severely displaced right femoral neck fracture on Xray for which Orthopedics was consulted. Cardiology was also consulted and patient was started on a heparin drip.  Troponins were trended and peaked at 1.31. Patient received HD and her mental status resolved back to her baseline, was continued on her regular HD schedule throughout her hospital stay. After much discussion with the patient about the high risk given the acute cardiac event, cardiac clearance for R hip surgery  was pursued. Echocardiogram showed EF 50-55% with severe basal septal hypertrophy distal septal hypokinesis. Given LV was in normal range and patient was willing to proceed with high risk surgery, patient underwent a R hip hemiarthroplasty. Patient did well post-op, orthopedics recommended WBAT RLE with rolling walker. Patient was evaluated by PT who recommended SNF placement. Patient remained stable, Cardiology discontinued heparin gtt, placed her on ASA81mg  qd and Plavix  qd and recommended cardiac evaluation in the future.  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 She needs CT abdomen/pelvis with contrast for further evaluation which will require coordination with hemodialysis. She is not able to undergo an MRI with contrast d/t her ESRD.  Issues for Follow Up:  1. Given STEMI, patient will need cardiac evaluation in the future.  2. Daily function and pain control s/p hip surgery. She needs to follow up with orthopedics Dr. Linna Caprice in 2 weeks. 3. Patient discharged on ASA  qd and Plavix  qd due to fall risk and risk of bleeding. She is not a candidate for anicoagulation. Needs to follow up with Cardiology Dr. Sharyn Lull in 2 weeks. 4. Patient needs to see dentist for her toothache  5. Please coordinate with dialysis to obtain CT abd/pelvis with contrast to further evaluate pancreatic cyst. 6. Please evaluate patient's baseline dementia, may benefit from [facility]. 7. Consider weaning patient off of xanax given her propensity for falls.  Significant Procedures: R hip hemiarthroplasty   Significant Labs and Imaging:   Recent Labs Lab 03/17/17 0243 03/17/17 1725 03/18/17 0249  WBC 8.7 11.2* 11.6*  HGB 8.4* 8.4* 8.0*  HCT 26.8* 27.0* 25.4*  PLT 263 339 339    Recent Labs Lab 03/13/17 0243   03/15/17 0524 03/16/17 0227 03/17/17 0243 03/17/17 1725 03/18/17 0651  NA 135  < > 134* 133* 136 136 135  K 4.3  < > 4.1 3.8 4.0 4.1 3.9  CL 96*  < > 99* 99* 100* 96* 98*  CO2 24  < > 20* GLUCOSE 103*  < > 102* 109* 97 113* 92  BUN 22*  < > 50* 25* 13 28* 34*  CREATININE 4.54*  < > 7.39* 4.80* 3.53* 5.00* 5.94*  CALCIUM 8.9  < > 8.7* 8.3* 8.9 9.4 9.1  MG  --   --   --   --   --  2.0  --   PHOS 4.3  --   --   --  2.6  --  3.3  ALBUMIN 3.0*  --   --   --  2.6*  --  2.6*  < > = values in this interval not displayed.   Results/Tests Pending at Time of Discharge: none  Discharge Medications:  Allergies as of 03/19/2017      Reactions   Aspirin Other (See Comments)   "makes my stomach hurt"   Codeine Other (See Comments)   "i get irritated"      Medication List    STOP taking these medications   HYDROcodone-acetaminophen 5-325 MG tablet Commonly known as:  NORCO/VICODIN   pantoprazole 40 MG tablet Commonly known as:  PROTONIX     TAKE these medications   acetaminophen 500 MG tablet Commonly known as:  TYLENOL Take 1,000 mg by mouth every 6 (six) hours as needed for mild pain.   ALPRAZolam 0.5 MG tablet Commonly known as:  XANAX Take 1 tablet (0.5 mg total) by mouth 2 (two) times daily.   aspirin 81 MG EC tablet Take 1 tablet (81 mg total) by mouth daily.   atorvastatin 40 MG tablet Commonly known as:  LIPITOR Take 1 tablet (40 mg total) by mouth daily at 6 PM.   calcitRIOL 0.25 MCG capsule Commonly known as:  ROCALTROL Take 3 capsules (0.75 mcg total) by mouth every Monday, Wednesday, and Friday with hemodialysis. Start taking on:  03/20/2017   calcium acetate 667 MG tablet Commonly known as:  PHOSLO Take 667 mg by mouth See admin instructions. Take 2 tablets (1334 mg) by mouth 3 times daily with meals and with snacks   clopidogrel 75 MG tablet Commonly known as:  PLAVIX Take 1 tablet (75 mg total) by mouth daily. Start taking on:  03/20/2017    famotidine 20 MG tablet Commonly known as:  PEPCID Take 1 tablet (20 mg total) by mouth daily. Start taking on:  03/20/2017   feeding supplement (NEPRO CARB STEADY) Liqd Take 237 mLs by mouth 2 (two) times daily between meals.   metoprolol tartrate 25 MG tablet Commonly known as:  LOPRESSOR Take 1 tablet (25 mg total) by mouth 2 (two) times daily. What changed:  how much to take   nitroGLYCERIN 0.4 MG SL tablet Commonly known as:  NITROSTAT Place 0.4 mg under the tongue every 5 (five) minutes as needed for chest pain.   penicillin v potassium 500 MG tablet Commonly known as:  VEETID Take 1 tablet (500 mg total) by mouth every 8 (eight) hours.   venlafaxine XR 37.5 MG 24 hr capsule Commonly known as:  EFFEXOR-XR Take 37.5 mg by mouth at bedtime.   VISINE OP Place 2 drops into both eyes daily as needed (dry eyes).            Durable Medical Equipment        Start     Ordered   03/16/17 1414  For home use only DME Walker rolling  Once    Question:  Patient needs a walker to treat with the following condition  Answer:  History of total hip arthroplasty, right   03/16/17 1414      Discharge Instructions: Please refer to Patient Instructions section of EMR for full details.  Patient was counseled important signs and symptoms that should prompt return to medical care, changes in medications, dietary instructions, activity restrictions, and follow up appointments.   Follow-Up Appointments:  Contact information for follow-up providers    Swinteck, Cloyde Reams, MD. Schedule an appointment as soon as possible for a visit in 2 week(s).   Specialty:  Orthopedic Surgery Why:  For wound re-check Contact information: [address] [place] [address] [phone]        Rinaldo Cloud, MD. Schedule an appointment as soon as possible for a visit.   Specialty:  Cardiology Why:  Please make a follow up appointment with Dr. Sharyn Lull as soon as possible for a  visit in 2 weeks.  Contact information: [address] [place] [address] [phone]            Contact information for after-discharge care    Destination    [facility] .   Specialty:  Skilled Nursing Facility Contact information: [address] [place] [address] [phone]                  Leland Her, DO 03/19/2017, 11:39 AM PGY-1, [facility]

## 2017-03-12 NOTE — Progress Notes (Signed)
[facility] Progress Note   Subjective: no c/o, R hip still hurting. Fully oriented.   Vitals:   03/11/17 1900 03/11/17 2235 03/12/17 0518 03/12/17 0831  BP: 136/72 120/71 (!) 127/107 (!) 147/77  Pulse: 93 100 96 98  Resp:  Temp: 99.5 F (37.5 C) 99.6 F (37.6 C) 98.6 F (37 C) 98.5 F (36.9 C)  TempSrc: Oral Oral Oral Oral  SpO2: 96% 99% 99% 98%  Weight:      Height:        Inpatient medications: . [START ON 03/13/2017] acetaminophen  1,000 mg Intravenous To OR  . ALPRAZolam  0.5 mg Oral BID  . aspirin EC  81 mg Oral Daily  . atorvastatin  40 mg Oral q1800  . calcitRIOL  0.75 mcg Oral Q M,W,F-HD  . calcium acetate  667 mg Oral TID WC  . [START ON 03/13/2017]  ceFAZolin (ANCEF) IV  2 g Intravenous To SS-Surg  . [START ON 03/16/2017] darbepoetin (ARANESP) injection - DIALYSIS  100 mcg Intravenous Q Mon-HD  . famotidine  20 mg Oral Daily  . feeding supplement (NEPRO CARB STEADY)  237 mL Oral BID BM  . ferric gluconate (FERRLECIT/NULECIT) IV  125 mg Intravenous Q M,W,F-HD  . metoprolol tartrate  25 mg Oral BID  . multivitamin  1 tablet Oral QHS  . nitroGLYCERIN  0.5 inch Topical Q6H  . penicillin v potassium  500 mg Oral Q8H  . sodium chloride flush  3 mL Intravenous Q12H  . [START ON 03/13/2017] tranexamic acid  1,000 mg Intravenous To OR  . venlafaxine XR  37.5 mg Oral QHS   . heparin 950 Units/hr (03/12/17 0552)   acetaminophen **OR** acetaminophen, calcium acetate, fentaNYL (SUBLIMAZE) injection, nitroGLYCERIN, polyethylene glycol, tetrahydrozoline  Exam: Alert, no distress, Ox 3 No jvd Chest clear bilat RRR no mrg ABd soft ntnd Ext no LE edema L IJ cath  Dialysis: [facility] MWF  4h  59kg  2/2 bath Hep 5000  L IJ - M75 ug last 3/14 - last Hb 10, tsat 175 ferr 346 pth 1022      Assessment: 1. AMS - resolved, fully oriented and appropriate now 2. R hip fx - per ortho 3. +trop/ acute septal MI - no CP now, EKG evolving septal ant MI.   Per cards, on IV hep 4. ESRD HD MWF 5. Volume - is at dry wt 6. Anemia of CKD - cont fe/ esa 7. MBD - cont meds  Plan - HD today off schedule   Vinson Moselle MD [facility] pager [phone]   03/12/2017, 12:00 PM    Recent Labs Lab 03/10/17 0132 03/10/17 0717 03/11/17 0437 03/12/17 0312  NA 138 137 132* 132*  K 6.1* 6.5* 3.9 4.5  CL 111 110 97* 100*  CO2 11* 12* 21* 19*  GLUCOSE 127* 117* 94 95  BUN 72* 73* 32* 46*  CREATININE 9.22* 9.11* 5.43* 7.04*  CALCIUM 9.6 9.2 8.3* 8.6*  PHOS 6.7*  --   --  6.4*    Recent Labs Lab 03/09/17 1407 03/10/17 0132 03/12/17 0312  AST 21  --   --   ALT 15  --   --   ALKPHOS 67  --   --   BILITOT 0.7  --   --   PROT 7.9  --   --   ALBUMIN 3.8 3.7 3.0*    Recent Labs Lab 03/09/17 1407 03/10/17 0717 03/11/17 0437 03/12/17 0312  WBC  10.2 9.5 7.7 8.2  NEUTROABS 9.2*  --   --   --   HGB 11.4* 10.7* 10.4* 9.9*  HCT 36.1 33.5* 32.3* 31.2*  MCV 78.0 77.2* 75.1* 75.5*  PLT 276 252 233 252   Iron/TIBC/Ferritin/ %Sat    Component Value Date/Time   IRON 74 10/23/2016 1405   TIBC 231 (L) 10/23/2016 1405   FERRITIN 159 10/23/2016 1405   IRONPCTSAT 32 (H) 10/23/2016 1405

## 2017-03-12 NOTE — Procedures (Signed)
Patient was seen on dialysis and the procedure was supervised.  BFR 350  Via PC BP is  129/75.   Patient appears to be tolerating treatment well  Monica [name] A 03/12/2017

## 2017-03-12 NOTE — Progress Notes (Signed)
Subjective:  Patient denies any chest pain or shortness of breath. Scheduled tentatively for surgery for tomorrow. Repeat EKG today showed evolving septal wall MI.  Objective:  Vital Signs in the last 24 hours: Temp:  [98.3 F (36.8 C)-99.6 F (37.6 C)] 98.5 F (36.9 C) (04/05 0831) Pulse Rate:  [93-100] 98 (04/05 0831) Resp:  [18-20] 18 (04/05 0831) BP: (106-147)/(54-107) 147/77 (04/05 0831) SpO2:  [96 %-99 %] 98 % (04/05 0831)  Intake/Output from previous day:  Intake/Output from this shift: No intake/output data recorded.  Physical Exam: Exam unchanged  Lab Results:  Recent Labs  03/11/17 0437 03/12/17 0312  WBC 7.7 8.2  HGB 10.4* 9.9*  PLT 233 252    Recent Labs  03/11/17 0437 03/12/17 0312  NA 132* 132*  K 3.9 4.5  CL 97* 100*  CO2 21* 19*  GLUCOSE 94 95  BUN 32* 46*  CREATININE 5.43* 7.04*    Recent Labs  03/10/17 0132 03/10/17 0717  TROPONINI 1.33* 1.31*   Hepatic Function Panel  Recent Labs  03/09/17 1407  03/12/17 0312  PROT 7.9  --   --   ALBUMIN 3.8  < > 3.0*  AST 21  --   --   ALT 15  --   --   ALKPHOS 67  --   --   BILITOT 0.7  --   --   < > = values in this interval not displayed. No results for input(s): CHOL in the last 72 hours. No results for input(s): PROTIME in the last 72 hours.  Imaging: Imaging results have been reviewed and Dg Knee Right Port  Result Date: 03/10/2017 CLINICAL DATA:  Fracture of the right femoral neck EXAM: PORTABLE RIGHT KNEE - 1-2 VIEW COMPARISON:  Pelvis radiograph 03/09/2017 FINDINGS: Lateral views are limited by patient positioning. No gross effusion. Alignment grossly within normal limits. Moderate-to-marked narrowing of the medial and lateral joint space compartments. Disc space calcification laterally. IMPRESSION: 1. No acute osseous abnormality 2. Moderate-to-marked arthritis. 3. Chondrocalcinosis Electronically Signed   By: Jasmine Pang M.D.   On: 03/10/2017 18:56    Cardiac  Studies:  Assessment/Plan:  Status post fall sustaining right femoral neck fracture Status post recent septal wall myocardial infarction Hypertension End-stage renal disease on hemodialysis History of paroxysmal A. fib flutter History of peptic ulcer disease GERD Anemia of chronic disease Elevated blood sugar rule out diabetes mellitus Depression Anxiety disorder Dementia Plan Continue present management Check 2-D echo still pending.  LOS: 3 days    Rinaldo Cloud 03/12/2017, 10:17 AM

## 2017-03-13 ENCOUNTER — Encounter (HOSPITAL_COMMUNITY)
Admission: EM | Disposition: A | Discharge status: Skilled Nursing Facility | Payer: Self-pay | Source: Home / Self Care | Attending: Family Medicine

## 2017-03-13 ENCOUNTER — Encounter (HOSPITAL_COMMUNITY): Payer: Self-pay | Admitting: Family Medicine

## 2017-03-13 ENCOUNTER — Inpatient Hospital Stay (HOSPITAL_COMMUNITY): Payer: Medicare Other | Admitting: Anesthesiology

## 2017-03-13 ENCOUNTER — Inpatient Hospital Stay (HOSPITAL_COMMUNITY): Payer: Medicare Other

## 2017-03-13 DIAGNOSIS — D631 Anemia in chronic kidney disease: Secondary | ICD-10-CM

## 2017-03-13 DIAGNOSIS — N189 Chronic kidney disease, unspecified: Secondary | ICD-10-CM

## 2017-03-13 DIAGNOSIS — I213 ST elevation (STEMI) myocardial infarction of unspecified site: Secondary | ICD-10-CM

## 2017-03-13 DIAGNOSIS — D62 Acute posthemorrhagic anemia: Secondary | ICD-10-CM | POA: Diagnosis present

## 2017-03-13 HISTORY — PX: ANTERIOR APPROACH HEMI HIP ARTHROPLASTY: SHX6690

## 2017-03-13 HISTORY — DX: Acute posthemorrhagic anemia: D62

## 2017-03-13 HISTORY — DX: Chronic kidney disease, unspecified: N18.9

## 2017-03-13 HISTORY — DX: Anemia in chronic kidney disease: D63.1

## 2017-03-13 LAB — RENAL FUNCTION PANEL
ALBUMIN: 3 g/dL — AB (ref 3.5–5.0)
ANION GAP: 15 (ref 5–15)
BUN: 22 mg/dL — AB (ref 6–20)
CALCIUM: 8.9 mg/dL (ref 8.9–10.3)
CO2: 24 mmol/L (ref 22–32)
Chloride: 96 mmol/L — ABNORMAL LOW (ref 101–111)
Creatinine, Ser: 4.54 mg/dL — ABNORMAL HIGH (ref 0.44–1.00)
GFR calc Af Amer: 9 mL/min — ABNORMAL LOW (ref >60)
GFR, EST NON AFRICAN AMERICAN: 8 mL/min — AB (ref >60)
GLUCOSE: 103 mg/dL — AB (ref 65–99)
PHOSPHORUS: 4.3 mg/dL (ref 2.5–4.6)
POTASSIUM: 4.3 mmol/L (ref 3.5–5.1)
SODIUM: 135 mmol/L (ref 135–145)

## 2017-03-13 LAB — CBC
HEMATOCRIT: 33.3 % — AB (ref 36.0–46.0)
Hemoglobin: 10.7 g/dL — ABNORMAL LOW (ref 12.0–15.0)
MCH: 24.2 pg — ABNORMAL LOW (ref 26.0–34.0)
MCHC: 32.1 g/dL (ref 30.0–36.0)
MCV: 75.3 fL — ABNORMAL LOW (ref 78.0–100.0)
Platelets: 262 10*3/uL (ref 150–400)
RBC: 4.42 MIL/uL (ref 3.87–5.11)
RDW: 15.8 % — AB (ref 11.5–15.5)
WBC: 8.4 10*3/uL (ref 4.0–10.5)

## 2017-03-13 LAB — ECHOCARDIOGRAM COMPLETE
Ao-asc: 36 cm
CHL CUP DOP CALC LVOT VTI: 18.2 cm
CHL CUP MV DEC (S): 144
CHL CUP RV SYS PRESS: 25 mmHg
CHL CUP TV REG PEAK VELOCITY: 236 cm/s
E/e' ratio: 21.13
EWDT: 144 ms
FS: 40 % (ref 28–44)
Height: 61 in
IVS/LV PW RATIO, ED: 1.11
LA ID, A-P, ES: 28 mm
LA diam index: 1.75 cm/m2
LA vol A4C: 30.3 ml
LAVOL: 32.2 mL
LAVOLIN: 20.1 mL/m2
LDCA: 2.01 cm2
LEFT ATRIUM END SYS DIAM: 28 mm
LV E/e'average: 21.13
LV PW d: 16.6 mm — AB (ref 0.6–1.1)
LV e' LATERAL: 5.68 cm/s
LVEEMED: 21.13
LVOT SV: 37 mL
LVOT peak vel: 103 cm/s
LVOTD: 16 mm
MV Peak grad: 6 mmHg
MV VTI: 185 cm
MV pk E vel: 120 m/s
P 1/2 time: 430 ms
PISA EROA: 0.06 cm2
RV LATERAL S' VELOCITY: 12.1 cm/s
RV TAPSE: 15.1 mm
S' Lateral: 7.6 cm/s
TDI e' lateral: 5.68
TDI e' medial: 3.43
TRMAXVEL: 236 cm/s
Weight: 2074.09 oz

## 2017-03-13 LAB — HEPARIN LEVEL (UNFRACTIONATED)

## 2017-03-13 LAB — PREPARE RBC (CROSSMATCH)

## 2017-03-13 SURGERY — HEMIARTHROPLASTY, HIP, DIRECT ANTERIOR APPROACH, FOR FRACTURE
Anesthesia: General | Site: Hip | Laterality: Right

## 2017-03-13 MED ORDER — SODIUM CHLORIDE 0.9 % IR SOLN
Status: DC | PRN
  Administered 2017-03-13: 3000 mL

## 2017-03-13 MED ORDER — SODIUM CHLORIDE 0.9 % IJ SOLN
INTRAMUSCULAR | Status: DC | PRN
  Administered 2017-03-13: 30 mL

## 2017-03-13 MED ORDER — MENTHOL 3 MG MT LOZG
1.0000 | LOZENGE | OROMUCOSAL | Status: DC | PRN
  Filled 2017-03-13: qty 9

## 2017-03-13 MED ORDER — FENTANYL CITRATE (PF) 100 MCG/2ML IJ SOLN
INTRAMUSCULAR | Status: DC | PRN
  Administered 2017-03-13 (×2): 50 ug via INTRAVENOUS

## 2017-03-13 MED ORDER — LACTATED RINGERS IV SOLN
INTRAVENOUS | Status: DC | PRN
  Administered 2017-03-13: 17:00:00 via INTRAVENOUS

## 2017-03-13 MED ORDER — ALBUMIN HUMAN 5 % IV SOLN
INTRAVENOUS | Status: DC | PRN
  Administered 2017-03-13: 18:00:00 via INTRAVENOUS

## 2017-03-13 MED ORDER — ONDANSETRON HCL 4 MG PO TABS: 4.0000 mg | ORAL_TABLET | Freq: Four times a day (QID) | ORAL | Status: DC | PRN

## 2017-03-13 MED ORDER — POVIDONE-IODINE 10 % EX SWAB
2.0000 "application " | Freq: Once | CUTANEOUS | Status: AC
  Administered 2017-03-13: 2 via TOPICAL

## 2017-03-13 MED ORDER — KETOROLAC TROMETHAMINE 30 MG/ML IJ SOLN
INTRAMUSCULAR | Status: DC | PRN
Start: 2017-03-13 — End: 2017-03-13
  Administered 2017-03-13: 30 mg via INTRAMUSCULAR

## 2017-03-13 MED ORDER — SODIUM CHLORIDE 0.9 % IV SOLN
INTRAVENOUS | Status: DC
  Administered 2017-03-13: 16:00:00 via INTRAVENOUS

## 2017-03-13 MED ORDER — BUPIVACAINE HCL (PF) 0.5 % IJ SOLN
INTRAMUSCULAR | Status: AC
  Filled 2017-03-13: qty 30

## 2017-03-13 MED ORDER — HEPARIN SODIUM (PORCINE) 5000 UNIT/ML IJ SOLN
5000.0000 [IU] | Freq: Three times a day (TID) | INTRAMUSCULAR | Status: DC
  Administered 2017-03-14 – 2017-03-15 (×4): 5000 [IU] via SUBCUTANEOUS
  Filled 2017-03-13 (×4): qty 1

## 2017-03-13 MED ORDER — MORPHINE SULFATE (PF) 2 MG/ML IV SOLN: 0.5000 mg | INTRAVENOUS | Status: DC | PRN

## 2017-03-13 MED ORDER — KETOROLAC TROMETHAMINE 30 MG/ML IJ SOLN
INTRAMUSCULAR | Status: AC
  Filled 2017-03-13: qty 1

## 2017-03-13 MED ORDER — HEPARIN (PORCINE) IN NACL 100-0.45 UNIT/ML-% IJ SOLN: 950.0000 [IU]/h | INTRAMUSCULAR | Status: DC

## 2017-03-13 MED ORDER — EPINEPHRINE PF 1 MG/ML IJ SOLN
INTRAMUSCULAR | Status: AC
  Filled 2017-03-13: qty 1

## 2017-03-13 MED ORDER — HYDROCODONE-ACETAMINOPHEN 5-325 MG PO TABS
1.0000 | ORAL_TABLET | Freq: Four times a day (QID) | ORAL | Status: DC | PRN
  Administered 2017-03-13: 2 via ORAL
  Administered 2017-03-14: 1 via ORAL
  Filled 2017-03-13: qty 2
  Filled 2017-03-13: qty 1

## 2017-03-13 MED ORDER — POVIDONE-IODINE 10 % EX SWAB: 2.0000 "application " | Freq: Once | CUTANEOUS | Status: DC

## 2017-03-13 MED ORDER — PHENYLEPHRINE HCL 10 MG/ML IJ SOLN
INTRAMUSCULAR | Status: DC | PRN
  Administered 2017-03-13 (×2): 80 ug via INTRAVENOUS

## 2017-03-13 MED ORDER — PHENOL 1.4 % MT LIQD: 1.0000 | OROMUCOSAL | Status: DC | PRN

## 2017-03-13 MED ORDER — ONDANSETRON HCL 4 MG/2ML IJ SOLN
4.0000 mg | Freq: Once | INTRAMUSCULAR | Status: DC | PRN
Start: 2017-03-13 — End: 2017-03-13

## 2017-03-13 MED ORDER — PHENYLEPHRINE HCL 10 MG/ML IJ SOLN
INTRAMUSCULAR | Status: DC | PRN
  Administered 2017-03-13: 50 ug/min via INTRAVENOUS

## 2017-03-13 MED ORDER — FENTANYL CITRATE (PF) 100 MCG/2ML IJ SOLN: 25.0000 ug | INTRAMUSCULAR | Status: DC | PRN

## 2017-03-13 MED ORDER — PROPOFOL 10 MG/ML IV BOLUS
INTRAVENOUS | Status: DC | PRN
  Administered 2017-03-13: 50 mg via INTRAVENOUS

## 2017-03-13 MED ORDER — SODIUM CHLORIDE 0.9 % IV SOLN
INTRAVENOUS | Status: DC | PRN
  Administered 2017-03-13: 250 mL

## 2017-03-13 MED ORDER — 0.9 % SODIUM CHLORIDE (POUR BTL) OPTIME
TOPICAL | Status: DC | PRN
  Administered 2017-03-13: 1000 mL

## 2017-03-13 MED ORDER — VECURONIUM BROMIDE 10 MG IV SOLR
INTRAVENOUS | Status: DC | PRN
  Administered 2017-03-13: 1 mg via INTRAVENOUS
  Administered 2017-03-13: 2 mg via INTRAVENOUS

## 2017-03-13 MED ORDER — VANCOMYCIN HCL IN DEXTROSE 1-5 GM/200ML-% IV SOLN
1000.0000 mg | Freq: Two times a day (BID) | INTRAVENOUS | Status: AC
  Administered 2017-03-14: 1000 mg via INTRAVENOUS
  Filled 2017-03-13: qty 200

## 2017-03-13 MED ORDER — MEPERIDINE HCL 25 MG/ML IJ SOLN: 6.2500 mg | INTRAMUSCULAR | Status: DC | PRN

## 2017-03-13 MED ORDER — BUPIVACAINE HCL (PF) 0.5 % IJ SOLN
INTRAMUSCULAR | Status: DC | PRN
  Administered 2017-03-13: 30 mL

## 2017-03-13 MED ORDER — VANCOMYCIN HCL IN DEXTROSE 1-5 GM/200ML-% IV SOLN
INTRAVENOUS | Status: AC
  Filled 2017-03-13: qty 200

## 2017-03-13 MED ORDER — NEOSTIGMINE METHYLSULFATE 10 MG/10ML IV SOLN
INTRAVENOUS | Status: DC | PRN
  Administered 2017-03-13: 3 mg via INTRAVENOUS

## 2017-03-13 MED ORDER — ONDANSETRON HCL 4 MG/2ML IJ SOLN
4.0000 mg | Freq: Four times a day (QID) | INTRAMUSCULAR | Status: DC | PRN
Start: 2017-03-13 — End: 2017-03-19
  Administered 2017-03-14: 4 mg via INTRAVENOUS
  Filled 2017-03-13: qty 2

## 2017-03-13 MED ORDER — VANCOMYCIN HCL IN DEXTROSE 1-5 GM/200ML-% IV SOLN
1000.0000 mg | INTRAVENOUS | Status: AC
  Administered 2017-03-13: 1000 mg via INTRAVENOUS

## 2017-03-13 MED ORDER — TRANEXAMIC ACID 1000 MG/10ML IV SOLN
2000.0000 mg | INTRAVENOUS | Status: AC
  Administered 2017-03-13: 2000 mg via TOPICAL
  Filled 2017-03-13: qty 20

## 2017-03-13 MED ORDER — GLYCOPYRROLATE 0.2 MG/ML IJ SOLN
INTRAMUSCULAR | Status: DC | PRN
  Administered 2017-03-13: 0.4 mg via INTRAVENOUS

## 2017-03-13 SURGICAL SUPPLY — 57 items
ADH SKN CLS APL DERMABOND .7 (GAUZE/BANDAGES/DRESSINGS) ×2
BAG DECANTER FOR FLEXI CONT (MISCELLANEOUS) ×2 IMPLANT
BLADE CLIPPER SURG (BLADE) IMPLANT
BLADE SAW SGTL 18X1.27X75 (BLADE) ×1 IMPLANT
BLADE SAW SGTL 18X1.27X75MM (BLADE)
CAPT HIP HEMI 2 ×2 IMPLANT
CHLORAPREP W/TINT 26ML (MISCELLANEOUS) ×3 IMPLANT
COVER SURGICAL LIGHT HANDLE (MISCELLANEOUS) ×3 IMPLANT
DERMABOND ADVANCED (GAUZE/BANDAGES/DRESSINGS) ×4
DERMABOND ADVANCED .7 DNX12 (GAUZE/BANDAGES/DRESSINGS) ×2 IMPLANT
DRAPE C-ARM 42X72 X-RAY (DRAPES) ×3 IMPLANT
DRAPE IMP U-DRAPE 54X76 (DRAPES) ×6 IMPLANT
DRAPE INCISE IOBAN 66X45 STRL (DRAPES) ×2 IMPLANT
DRAPE STERI IOBAN 125X83 (DRAPES) ×3 IMPLANT
DRAPE U-SHAPE 47X51 STRL (DRAPES) ×9 IMPLANT
DRSG AQUACEL AG ADV 3.5X10 (GAUZE/BANDAGES/DRESSINGS) ×3 IMPLANT
ELECT BLADE 4.0 EZ CLEAN MEGAD (MISCELLANEOUS) ×3
ELECT REM PT RETURN 9FT ADLT (ELECTROSURGICAL) ×3
ELECTRODE BLDE 4.0 EZ CLN MEGD (MISCELLANEOUS) ×1 IMPLANT
ELECTRODE REM PT RTRN 9FT ADLT (ELECTROSURGICAL) ×1 IMPLANT
EVACUATOR 1/8 PVC DRAIN (DRAIN) IMPLANT
GLOVE BIO SURGEON STRL SZ8.5 (GLOVE) ×6 IMPLANT
GLOVE BIOGEL PI IND STRL 8.5 (GLOVE) ×1 IMPLANT
GLOVE BIOGEL PI INDICATOR 8.5 (GLOVE) ×2
GOWN STRL REUS W/ TWL LRG LVL3 (GOWN DISPOSABLE) ×2 IMPLANT
GOWN STRL REUS W/TWL 2XL LVL3 (GOWN DISPOSABLE) ×3 IMPLANT
GOWN STRL REUS W/TWL LRG LVL3 (GOWN DISPOSABLE) ×6
HANDPIECE INTERPULSE COAX TIP (DISPOSABLE) ×3
HOOD PEEL AWAY FACE SHEILD DIS (HOOD) ×6 IMPLANT
KIT BASIN OR (CUSTOM PROCEDURE TRAY) ×3 IMPLANT
KIT ROOM TURNOVER OR (KITS) ×3 IMPLANT
MANIFOLD NEPTUNE II (INSTRUMENTS) ×3 IMPLANT
MARKER SKIN DUAL TIP RULER LAB (MISCELLANEOUS) ×3 IMPLANT
NDL SPNL 18GX3.5 QUINCKE PK (NEEDLE) ×1 IMPLANT
NEEDLE SPNL 18GX3.5 QUINCKE PK (NEEDLE) ×3 IMPLANT
NS IRRIG 1000ML POUR BTL (IV SOLUTION) ×3 IMPLANT
PACK TOTAL JOINT (CUSTOM PROCEDURE TRAY) ×3 IMPLANT
PACK UNIVERSAL I (CUSTOM PROCEDURE TRAY) ×3 IMPLANT
PAD ARMBOARD 7.5X6 YLW CONV (MISCELLANEOUS) ×6 IMPLANT
SAW OSC TIP CART 19.5X105X1.3 (SAW) ×2 IMPLANT
SEALER BIPOLAR AQUA 6.0 (INSTRUMENTS) IMPLANT
SET HNDPC FAN SPRY TIP SCT (DISPOSABLE) ×1 IMPLANT
SUCTION FRAZIER HANDLE 10FR (MISCELLANEOUS) ×2
SUCTION TUBE FRAZIER 10FR DISP (MISCELLANEOUS) ×1 IMPLANT
SUT ETHIBOND NAB CT1 #1 30IN (SUTURE) ×6 IMPLANT
SUT MNCRL AB 3-0 PS2 18 (SUTURE) ×5 IMPLANT
SUT MON AB 2-0 CT1 36 (SUTURE) ×5 IMPLANT
SUT VIC AB 1 CT1 27 (SUTURE) ×6
SUT VIC AB 1 CT1 27XBRD ANBCTR (SUTURE) ×1 IMPLANT
SUT VIC AB 2-0 CT1 27 (SUTURE) ×3
SUT VIC AB 2-0 CT1 TAPERPNT 27 (SUTURE) ×1 IMPLANT
SUT VLOC 180 0 24IN GS25 (SUTURE) ×3 IMPLANT
SYR 50ML LL SCALE MARK (SYRINGE) ×3 IMPLANT
TOWEL OR 17X24 6PK STRL BLUE (TOWEL DISPOSABLE) ×3 IMPLANT
TOWEL OR 17X26 10 PK STRL BLUE (TOWEL DISPOSABLE) ×3 IMPLANT
TRAY FOLEY CATH 16FR SILVER (SET/KITS/TRAYS/PACK) IMPLANT
WATER STERILE IRR 1000ML POUR (IV SOLUTION) ×9 IMPLANT

## 2017-03-13 NOTE — Progress Notes (Signed)
Family Medicine Teaching Service Daily Progress Note Intern Pager: [phone]  Patient name: Monica [name] Medical record number: [id number] Date of birth: 24-Dec-1931 Age: 83 y.o. Gender: female  Primary Care Provider: No PCP Per Patient Consultants: Cardiology Code Status: Full  Chief Complaint: AMS and right hip pain  Assessment and Plan: Monica [name] is a 83 y.o. female presenting with AMS and right hip pain . PMH is significant for HTN, ESRD with HD MWF, Anxiety, Dementia  STEMI: EKG on admit with Q waves in V1 to V3 and ST elevation in anterior leads, most recent EKG now showing evolving septal ant MI. Troponins elevated to peak 1.31.  -Per Cardiology, 2D echo prior to surgery and if LV function appears in normal range patient is willing to proceed with high risk surgery. Patient will need cardiac evaluation in the future. Appreciate recommendations  - Echo in process this morning -Telemetry -ASA 81 -Heprarin drip discontinued per cardiology this am in anticipation of surgery this afternoon -Lopressor 50 mg BID -Atorvastatin 40 mg   Right Femoral Neck Fracture: Likely from mechanical fall, unwitnessed. Right hip XRAY showing severely displaced right femoral neck fracture. Surgery would be high risk given active cardiac issue.  -Per Orthopedics, surgery tentatively planned for today 4/6 at 4:20pm but awaiting cardiac clearance and echo is being done this morning. Appreciate recommendations -Tylenol 650 mg q6h prn mild pain and fentanyl 50 g q2h prn for breakthrough - vitamin D 25-Hydroxy level low 13.8, continue calcitriol supplementation. - nutrition consult - avoid PPI, continue H2 blocker instead   ESRD: HD on MWF - Continue home phoslo - Nephrology following, HD next 4/7 - Daily renal function panel  Anemia  Baseline ~11. Last iron panel 10/2016 that showed adequate iron, folate, B12, ferritin. Likely multifactorial from ESRD and R hip fracture. Hgb stable today  11.4>10.7>10.4>9.9>10.7 - monitor CBC  Depression and Anxiety:  - Continue home Effexor and Xanax  Toothache Per speech evaluation, edema R upper maxilla with last molar sensitive to touch. - penicillin V - outpatient follow up with dentist  Acute toxic metabolic encephalopathy, resolved:  Multifactorial likely in setting of missed dialysis prior to admission. Now fully oriented and appropriate. - TSH WNL at 4.2 - monitor mental status   Dementia: Baseline dementia per chart review as well as per discussion with her neighbor. Has some hallucinations as well as some lip tremor and hand and feet tremors,?Lewy body dementia - Continue to monitor mental status  FEN/GI: renal diet Prophylaxis: Heparin gtt  Disposition: pending surgery  Subjective:  Feeling anxious today about whether or not she will have surgery. Continues to have R hip pain that is well controlled at rest but is severe when she moves. No CP, SOB.  Objective: Temp:  [98 F (36.7 C)-100.1 F (37.8 C)] 98.8 F (37.1 C) (04/06 0510) Pulse Rate:  [68-101] 99 (04/06 0510) Resp:  [17-18] 18 (04/06 0510) BP: (73-155)/(64-86) 134/75 (04/06 0510) SpO2:  [95 %-98 %] 96 % (04/06 0510) Weight:  [58.8 kg (129 lb 10.1 oz)-59 kg (130 lb 1.1 oz)] 58.8 kg (129 lb 10.1 oz) (04/06 0510) Physical Exam: General: NAD, resting comfortably in bed Cardiovascular: RRR, no m/r/g Respiratory: CTAB, no W/R/R Abdomen: nontender, nondistended Extremities: R hip tenderness, 1+ pitting edema to knees that per patient is her baseline Neuro: alert and oriented  Laboratory:  Recent Labs Lab 03/11/17 0437 03/12/17 0312 03/13/17 0243  WBC 7.7 8.2 8.4  HGB 10.4* 9.9* 10.7*  HCT 32.3* 31.2* 33.3*  PLT 233 252 262    Recent Labs Lab 03/09/17 1407  03/11/17 0437 03/12/17 0312 03/13/17 0243  NA 137  < > 132* 132* 135  K 5.8*  < > 3.9 4.5 4.3  CL 108  < > 97* 100* 96*  CO2 14*  < > 21* 19* 24  BUN 67*  < > 32* 46* 22*   CREATININE 9.24*  < > 5.43* 7.04* 4.54*  CALCIUM 9.6  < > 8.3* 8.6* 8.9  PROT 7.9  --   --   --   --   BILITOT 0.7  --   --   --   --   ALKPHOS 67  --   --   --   --   ALT 15  --   --   --   --   AST 21  --   --   --   --   GLUCOSE 154*  < > 94 95 103*  < > = values in this interval not displayed.    Imaging/Diagnostic Tests: No results found.   Leland Her, DO 03/13/2017, 6:02 AM PGY-1, [facility] FPTS Intern pager: [phone], text pages welcome

## 2017-03-13 NOTE — Op Note (Addendum)
OPERATIVE REPORT  SURGEON: Samson Frederic, MD   ASSISTANT: Hart Carwin, RNFA.  PREOPERATIVE DIAGNOSIS: Displaced Right femoral neck fracture.   POSTOPERATIVE DIAGNOSIS: Displaced Right femoral neck fracture.   PROCEDURE: Right hip hemiarthroplasty, anterior approach.   IMPLANTS: DePuy Tri Lock stem, size 9, std offset, with a -3 mm spacer and a 45 mm monopolar head ball.  ANESTHESIA:  General  ANTIBIOTICS: 2g ancef.  ESTIMATED BLOOD LOSS: 200 mL.   DRAINS: None.  COMPLICATIONS: None   CONDITION: PACU - hemodynamically stable.   BRIEF CLINICAL NOTE: Monica [name] is a 83 y.o. female with a displaced Right femoral neck fracture. The patient was admitted to the hospitalist service and underwent perioperative risk stratification and medical optimization. The patient is on hemodialysis Tuesday, Thursday, and Saturday. While in the hospital she was found to have an ST elevation MI. She was seen by cardiology and started on a heparin drip. She was determined to be high risk. The patient was suffering from severe right hip pain, and inability to mobilize. The risks, benefits, and alternatives to hemiarthroplasty were explained, and the patient elected to proceed. Her heparin drip was stopped 6 hours prior to surgery.  PROCEDURE IN DETAIL: The patient was taken to the operating room and general anesthesia was induced on the hospital bed. The patient was then positioned on the Hana table. All bony prominences were well padded. The hip was prepped and draped in the normal sterile surgical fashion. A time-out was called verifying side and site of surgery. Antibiotics were given within 60 minutes of beginning the procedure.  The direct anterior approach to the hip was performed through the Hueter interval. Lateral femoral circumflex vessels were treated with the Auqumantys. The anterior capsule was exposed and an inverted T capsulotomy was made. Fracture hematoma was encountered and  evacuated. The patient was found to have a comminuted Right subcapital femoral neck fracture. I freshened the femoral neck cut with a saw. I removed the femoral neck fragment. A corkscrew was placed into the head and the head was removed. This was passed to the back table and was measured.  Acetabular exposure was achieved. I examined the articular cartilage which was intact. The labrum was intact. A 45 mm trial head was placed and found to have excellent fit.  I then gained femoral exposure taking care to protect the abductors and greater trochanter. This was performed using standard external rotation, extension, and adduction. The capsule was peeled off the inner aspect of the greater trochanter, taking care to preserve the short external rotators. A cookie cutter was used to enter the femoral canal, and then the femoral canal finder was used to confirm location. I then sequentially broached up to a size 9. Calcar planer was used on the femoral neck remnant. I paced a std neck and a 36+ 0 head ball.The hip was reduced. Leg lengths were checked fluoroscopically. The hip was dislocated and trial components were removed. I placed the real stem followed by the real spacer and head ball. A single reduction maneuver was performed and the hip was reduced. Fluoroscopy was used to confirm component position and leg lengths. At 90 degrees of external rotation and extension, the hip was stable to an anterior directed force.  The wound was copiously irrigated with normal saline solution. Marcaine solution was injected into the periarticular soft tissue. The wound was closed in layers using #1 Vicryl and V-Loc for the fascia, 2-0 Vicryl for the subcutaneous fat, 2-0 Monocryl for the deep  dermal layer, 3-0 running Monocryl subcuticular stitch and glue for the skin. Once the glue was fully dried, an Aquacell Ag dressing was applied. The patient was then awakened from anesthesia and transported to  the recovery room in stable condition. Sponge, needle, and instrument counts were correct at the end of the case x2. The patient tolerated the procedure well and there were no known complications.  Postoperatively, we will readmit the patient to the hospitalist. She may weight-bear as tolerated with a walker. I spoke with Dr. Viann Fish regarding the patient's postop anticoagulation. We will begin subcutaneous heparin tomorrow morning. After 24 hours, her heparin drip may be resumed with no bolus.

## 2017-03-13 NOTE — Anesthesia Preprocedure Evaluation (Addendum)
Anesthesia Evaluation   Patient awake    Reviewed: Allergy & Precautions, NPO status , Patient's Chart, lab work & pertinent test results, reviewed documented beta blocker date and time   Airway Mallampati: II       Dental  (+) Teeth Intact, Loose,    Pulmonary former smoker,    breath sounds clear to auscultation       Cardiovascular hypertension, + Past MI (recent STEMI, cardiology consulted)   Rhythm:Regular Rate:Normal     Neuro/Psych  Headaches, PSYCHIATRIC DISORDERS Anxiety Depression    GI/Hepatic PUD, GERD  ,  Endo/Other    Renal/GU ESRFRenal disease     Musculoskeletal  (+) Arthritis ,   Abdominal   Peds  Hematology  (+) anemia ,   Anesthesia Other Findings   Reproductive/Obstetrics                           Anesthesia Physical Anesthesia Plan  ASA: IV  Anesthesia Plan: General   Post-op Pain Management:    Induction: Intravenous  Airway Management Planned: Oral ETT  Additional Equipment:   Intra-op Plan:   Post-operative Plan: Extubation in OR  Informed Consent:   Plan Discussed with: CRNA  Anesthesia Plan Comments: (Recent MI, Cardiology consulted, EF 50-55%  Patient understands, alert , oriented, agrees to proceed. Understands high risk associated with recent STEMI and surgical procedure with anesthesia. Last dialysis 03/12/17)       Anesthesia Quick Evaluation

## 2017-03-13 NOTE — Interval H&P Note (Signed)
History and Physical Interval Note:  03/13/2017 4:09 PM  Monica [name]  has presented today for surgery, with the diagnosis of Right hip fracture  The various methods of treatment have been discussed with the patient and family. After consideration of risks, benefits and other options for treatment, the patient has consented to  Procedure(s): ANTERIOR APPROACH HEMI HIP ARTHROPLASTY (Right) as a surgical intervention .  The patient's history has been reviewed, patient examined, no change in status, stable for surgery.  I have reviewed the patient's chart and labs.  Questions were answered to the patient's satisfaction.    Patient is very high risk given medical comorbidities and recent MI. She has severe right hip pain and cannot mobilize. Therefore I recommend surgical intervention. She understands and wishes to proceed.  The risks, benefits, and alternatives were discussed with the patient. There are risks associated with the surgery including, but not limited to, problems with anesthesia (death), infection, instability (giving out of the joint), dislocation, differences in leg length/angulation/rotation, fracture of bones, loosening or failure of implants, hematoma (blood accumulation) which may require surgical drainage, blood clots, pulmonary embolism, nerve injury (foot drop and lateral thigh numbness), and blood vessel injury. The patient understands these risks and elects to proceed.    Fatime Biswell, Cloyde Reams

## 2017-03-13 NOTE — Care Management Important Message (Signed)
Important Message  Patient Details  Name: Monica [name] MRN: [id number] Date of Birth: [birth year]   Medicare Important Message Given:  Yes    Dorena Bodo 03/13/2017, 2:32 PM

## 2017-03-13 NOTE — Progress Notes (Signed)
Patient came back from Surgery. Vitals stable. Right hip with surgical dressing in place.Telemetry applied and CCMD notified.

## 2017-03-13 NOTE — H&P (View-Only) (Signed)
Reason for Consult:  Right hip fracture Referring Physician: ER Physician  Monica [name] is an 83 y.o. female.  HPI: 83 yo female who lives independently had ground level fall.  Brought to ER by EMS and neighbor friend.  No other complaints other than right hip pain    Past Medical History:  Diagnosis Date  . Anxiety   . Arthritis    "all over" (01/29/2017)  . Atrial flutter ([place])   . Chronic back pain    "all my back" (01/29/2017)  . Constipation   . Dementia   . Depression   . ESRD (end stage renal disease) on dialysis [facility])    "MWF; Jeneen Rinks" (01/29/2017)  . ETOHism ([place])    Quit in the 1980s.  . Gastric ulcer 12/2013.   Prepyloric ulcer. On follow-up EGD in 02/2014 this was 90% healed  . GERD (gastroesophageal reflux disease)   . Headache    as a young woman  . Hemorrhoids   . History of kidney stones   . History of stomach ulcers   . Hypertension   . Pancreatic cyst 09/2010.   At uncinate process.  Serial CT imaging favors benign process    Past Surgical History:  Procedure Laterality Date  . ABDOMINAL HYSTERECTOMY    . ARTERIOVENOUS GRAFT PLACEMENT Left 01/29/2017   INSERTION OF ARTERIOVENOUS (AV) GORE-TEX GRAFT  STRETCHED IN LEFT FORE ARM   . AV FISTULA PLACEMENT Left 03/20/2015   Procedure: ARTERIOVENOUS (AV) FISTULA CREATION;  Surgeon: Angelia Mould, MD;  Location: [place];  Service: Vascular;  Laterality: Left;  . AV FISTULA PLACEMENT Left 01/29/2017   Procedure: INSERTION OF ARTERIOVENOUS (AV) GORE-TEX GRAFT  STRETCHED IN LEFT FORE ARM;  Surgeon: Angelia Mould, MD;  Location: [place];  Service: Vascular;  Laterality: Left;  . [place] REMOVAL Left 01/31/2017   Procedure: REMOVAL OF ARTERIOVENOUS GORETEX GRAFT ([place]) WITH Vein patching.;  Surgeon: Conrad Edison, MD;  Location: [place];  Service: Vascular;  Laterality: Left;  . CATARACT EXTRACTION Right   . COLONOSCOPY  2010   Per Dr. Collene Mares  . ESOPHAGOGASTRODUODENOSCOPY N/A 12/29/2013   Procedure:  ESOPHAGOGASTRODUODENOSCOPY (EGD);  Surgeon: Lafayette Dragon, MD;  Location: [facility] END[facility]COPY;  Service: Endoscopy;  Laterality: N/A;  . Cataract   w/LOA  . FLEXIBLE SIGMOIDOSCOPY  08/2011   Per Dr. Oletta Lamas. Normal study. Random biopsies positive for trace melanosis coli.  Marland Kitchen FOOT SURGERY Bilateral    "surgery for flat feet"  . INSERTION OF DIALYSIS CATHETER Right 11/25/2016   Procedure: INSERTION OF Right Internal jugular DIALYSIS CATHETER;  Surgeon: Conrad [place], MD;  Location: [place];  Service: Vascular;  Laterality: Right;  . SHOULDER ARTHROSCOPY W/ ROTATOR CUFF REPAIR Right 08/2003   Archie Endo 01/29/2017  . TIBIA FRACTURE SURGERY Right    "broke between my knee and my ankle"  . TOTAL ABDOMINAL HYSTERECTOMY W/ BILATERAL SALPINGOOPHORECTOMY  1980  . TUBAL LIGATION      Family History  Problem Relation Age of Onset  . Alcohol abuse Mother     Social History:  reports that she quit smoking about 31 years ago. Her smoking use included Cigarettes. She has a 16.00 pack-year smoking history. She has never used smokeless tobacco. She reports that she drinks alcohol. She reports that she does not use drugs.  Allergies:  Allergies  Allergen Reactions  . Aspirin Other (See Comments)    "makes my stomach hurt"  . Codeine Other (See Comments)    "  i get irritated"      Results for orders placed or performed during the hospital encounter of 03/09/17 (from the past 48 hour(s))  CBC with Differential     Status: Abnormal   Collection Time: 03/09/17  2:07 PM  Result Value Ref Range   WBC 10.2 4.0 - 10.5 K/uL   RBC 4.63 3.87 - 5.11 MIL/uL   Hemoglobin 11.4 (L) 12.0 - 15.0 g/dL   HCT 36.1 36.0 - 46.0 %   MCV 78.0 78.0 - 100.0 fL   MCH 24.6 (L) 26.0 - 34.0 pg   MCHC 31.6 30.0 - 36.0 g/dL   RDW 15.6 (H) 11.5 - 15.5 %   Platelets 276 150 - 400 K/uL   Neutrophils Relative % 90 %   Lymphocytes Relative 5 %   Monocytes Relative 5 %   Eosinophils Relative 0 %   Basophils Relative 0  %   Neutro Abs 9.2 (H) 1.7 - 7.7 K/uL   Lymphs Abs 0.5 (L) 0.7 - 4.0 K/uL   Monocytes Absolute 0.5 0.1 - 1.0 K/uL   Eosinophils Absolute 0.0 0.0 - 0.7 K/uL   Basophils Absolute 0.0 0.0 - 0.1 K/uL   RBC Morphology ELLIPTOCYTES     Comment: BURR CELLS  Comprehensive metabolic panel     Status: Abnormal   Collection Time: 03/09/17  2:07 PM  Result Value Ref Range   Sodium 137 135 - 145 mmol/L   Potassium 5.8 (H) 3.5 - 5.1 mmol/L   Chloride 108 101 - 111 mmol/L   CO2 14 (L) 22 - 32 mmol/L   Glucose, Bld 154 (H) 65 - 99 mg/dL   BUN 67 (H) 6 - 20 mg/dL   Creatinine, Ser 9.24 (H) 0.44 - 1.00 mg/dL   Calcium 9.6 8.9 - 10.3 mg/dL   Total Protein 7.9 6.5 - 8.1 g/dL   Albumin 3.8 3.5 - 5.0 g/dL   AST 21 15 - 41 U/L   ALT 15 14 - 54 U/L   Alkaline Phosphatase 67 38 - 126 U/L   Total Bilirubin 0.7 0.3 - 1.2 mg/dL   GFR calc non Af Amer 3 (L) >60 mL/min   GFR calc Af Amer 4 (L) >60 mL/min    Comment: (NOTE) The eGFR has been calculated using the CKD EPI equation. This calculation has not been validated in all clinical situations. eGFR's persistently <60 mL/min signify possible Chronic Kidney Disease.    Anion gap 15 5 - 15  Ethanol     Status: None   Collection Time: 03/09/17  2:07 PM  Result Value Ref Range   Alcohol, Ethyl (B) <5 <5 mg/dL    Comment:        LOWEST DETECTABLE LIMIT FOR SERUM ALCOHOL IS 5 mg/dL FOR MEDICAL PURPOSES ONLY   CK     Status: None   Collection Time: 03/09/17  2:07 PM  Result Value Ref Range   Total CK 170 38 - 234 U/L  I-Stat Troponin, ED (not at [facility])     Status: Abnormal   Collection Time: 03/09/17  5:44 PM  Result Value Ref Range   Troponin i, poc 0.86 (HH) 0.00 - 0.08 ng/mL   Comment NOTIFIED PHYSICIAN    Comment 3            Comment: Due to the release kinetics of cTnI, a negative result within the first hours of the onset of symptoms does not rule out myocardial infarction with certainty. If myocardial infarction is still suspected, repeat  the test at appropriate intervals.     Dg Chest 1 View  Result Date: 03/09/2017 CLINICAL DATA:  Fall. EXAM: CHEST 1 VIEW COMPARISON:  Radiograph November 25, 2016. FINDINGS: Stable cardiomediastinal silhouette. Left internal jugular dialysis catheter is unchanged in position. No pneumothorax or pleural effusion is noted. No acute pulmonary disease is noted. Bony thorax is unremarkable. IMPRESSION: No acute cardiopulmonary abnormality seen. Electronically Signed   By: Marijo Conception, M.D.   On: 03/09/2017 15:23   Dg Lumbar Spine Complete  Result Date: 03/09/2017 CLINICAL DATA:  Low back pain after fall. EXAM: LUMBAR SPINE - COMPLETE 4+ VIEW COMPARISON:  CT scan of August 13, 2016. FINDINGS: Diffuse osteopenia is noted. Atherosclerosis thoracic aorta is noted. No fracture or spondylolisthesis is noted. Mild degenerative disc disease is noted at L3-4, L4-5 and L5-S1. IMPRESSION: Mild multilevel degenerative disc disease. Aortic atherosclerosis. No acute abnormality seen in the lumbar spine. Electronically Signed   By: Marijo Conception, M.D.   On: 03/09/2017 15:19   Dg Shoulder Right  Result Date: 03/09/2017 CLINICAL DATA:  Right shoulder pain after fall. EXAM: RIGHT SHOULDER - 2+ VIEW COMPARISON:  None. FINDINGS: There is no evidence of fracture or dislocation. Mild degenerative changes seen involving right glenohumeral joint. Soft tissues are unremarkable. IMPRESSION: Mild degenerative joint disease of right glenohumeral joint. No acute abnormality seen in the right shoulder. Electronically Signed   By: Marijo Conception, M.D.   On: 03/09/2017 15:16   Ct Head Wo Contrast  Result Date: 03/09/2017 CLINICAL DATA:  Fall with altered mental status EXAM: CT HEAD WITHOUT CONTRAST CT CERVICAL SPINE WITHOUT CONTRAST TECHNIQUE: Multidetector CT imaging of the head and cervical spine was performed following the standard protocol without intravenous contrast. Multiplanar CT image reconstructions of the cervical  spine were also generated. COMPARISON:  07/18/2011 head CT FINDINGS: CT HEAD FINDINGS Brain: No mass lesion, intraparenchymal hemorrhage or extra-axial collection. No evidence of acute cortical infarct. Ventriculomegaly is unchanged compared to the prior examination, likely secondary to chronic volume loss. Vascular: No hyperdense vessel or unexpected calcification. Skull: Normal visualized skull base, calvarium and extracranial soft tissues. Sinuses/Orbits: No sinus fluid levels or advanced mucosal thickening. No mastoid effusion. Normal orbits. CT CERVICAL SPINE FINDINGS Alignment: No static subluxation. Facets are aligned. Occipital condyles are normally positioned. Skull base and vertebrae: No acute fracture. Soft tissues and spinal canal: No prevertebral fluid or swelling. No visible canal hematoma. Disc levels: Multilevel cervical degenerative disc disease without advanced spinal canal or neural foraminal stenosis. Upper chest: No pneumothorax, pulmonary nodule or pleural effusion. Other: Normal visualized paraspinal cervical soft tissues. IMPRESSION: 1. Chronic volume loss, unchanged, without acute intracranial abnormality. 2. No acute fracture or static subluxation of the cervical spine. Electronically Signed   By: Ulyses Jarred M.D.   On: 03/09/2017 16:09   Ct Cervical Spine Wo Contrast  Result Date: 03/09/2017 CLINICAL DATA:  Fall with altered mental status EXAM: CT HEAD WITHOUT CONTRAST CT CERVICAL SPINE WITHOUT CONTRAST TECHNIQUE: Multidetector CT imaging of the head and cervical spine was performed following the standard protocol without intravenous contrast. Multiplanar CT image reconstructions of the cervical spine were also generated. COMPARISON:  07/18/2011 head CT FINDINGS: CT HEAD FINDINGS Brain: No mass lesion, intraparenchymal hemorrhage or extra-axial collection. No evidence of acute cortical infarct. Ventriculomegaly is unchanged compared to the prior examination, likely secondary to  chronic volume loss. Vascular: No hyperdense vessel or unexpected calcification. Skull: Normal visualized skull base, calvarium and extracranial soft tissues. Sinuses/Orbits: No  sinus fluid levels or advanced mucosal thickening. No mastoid effusion. Normal orbits. CT CERVICAL SPINE FINDINGS Alignment: No static subluxation. Facets are aligned. Occipital condyles are normally positioned. Skull base and vertebrae: No acute fracture. Soft tissues and spinal canal: No prevertebral fluid or swelling. No visible canal hematoma. Disc levels: Multilevel cervical degenerative disc disease without advanced spinal canal or neural foraminal stenosis. Upper chest: No pneumothorax, pulmonary nodule or pleural effusion. Other: Normal visualized paraspinal cervical soft tissues. IMPRESSION: 1. Chronic volume loss, unchanged, without acute intracranial abnormality. 2. No acute fracture or static subluxation of the cervical spine. Electronically Signed   By: Ulyses Jarred M.D.   On: 03/09/2017 16:09   Dg Hip Unilat With Pelvis 2-3 Views Right  Result Date: 03/09/2017 CLINICAL DATA:  Right hip pain after fall. EXAM: DG HIP (WITH OR WITHOUT PELVIS) 2-3V RIGHT COMPARISON:  None. FINDINGS: Severely displaced right proximal femoral neck fracture is noted. Femoral head is well situated within the acetabulum. Mild degenerative joint disease is noted bilaterally. IMPRESSION: Severely displaced proximal right femoral neck fracture. Electronically Signed   By: Marijo Conception, M.D.   On: 03/09/2017 15:21    Review of Systems  Constitutional: Negative.   HENT: Negative.   Eyes: Negative.   Respiratory: Negative.   Cardiovascular: Negative.   Gastrointestinal: Positive for constipation and heartburn.  Genitourinary: Negative.   Musculoskeletal: Positive for back pain and joint pain.  Skin: Negative.   Neurological: Negative.   Endo/Heme/Allergies: Negative.   Psychiatric/Behavioral: Positive for depression and memory loss. The  patient is nervous/anxious.    Blood pressure (!) 134/116, pulse 94, temperature 98.3 F (36.8 C), temperature source Oral, resp. rate 19, weight 60.3 kg (132 lb 15 oz), SpO2 98 %.   Awake alert oriented to her current situation   Physical Exam  Constitutional: She appears well-developed.  HENT:  Head: Normocephalic.  Eyes: Pupils are equal, round, and reactive to light.  Neck: Neck supple. No JVD present. No tracheal deviation present. No thyromegaly present.  Cardiovascular: Normal rate, regular rhythm and intact distal pulses.   Respiratory: Effort normal and breath sounds normal. No respiratory distress. She has no wheezes.  GI: Soft. There is no tenderness. There is no guarding.  Musculoskeletal:       Right hip: She exhibits decreased range of motion, decreased strength, tenderness, bony tenderness and deformity. She exhibits no laceration.  Lymphadenopathy:    She has no cervical adenopathy.  Neurological: She is alert.  Skin: Skin is warm and dry.  Psychiatric: Cognition and memory are impaired.    Assessment/Plan: Displaced right proximal femoral neck fracture   Being evaluated by medicine and cardio Will need clearance prior to surgery Will need a hemiarthroplasty when cleared.     Pricilla Loveless 03/09/2017, 8:28 PM   Reviewed injury with patient. Await cardiac clearance prior to proceeding with right hip hemiarthroplasty hopefully tomorrow, 4/3 Agree with above note

## 2017-03-13 NOTE — Progress Notes (Signed)
On this date at this time RN spoke with Dr Sharyn Lull who states he has documented this patient is "High Risk but we don't have a choice.  She needs the surgery."

## 2017-03-13 NOTE — Discharge Instructions (Signed)
°Dr. Mikell Kazlauskas °Joint Replacement Specialist °[facility] °[address] °[place], [place] [address] °[phone] ° ° °TOTAL HIP REPLACEMENT POSTOPERATIVE DIRECTIONS ° ° ° °Hip Rehabilitation, Guidelines Following Surgery  ° °WEIGHT BEARING °Weight bearing as tolerated with assist device (walker, cane, etc) as directed, use it as long as suggested by your surgeon or therapist, typically at least 4-6 weeks. ° °The results of a hip operation are greatly improved after range of motion and muscle strengthening exercises. Follow all safety measures which are given to protect your hip. If any of these exercises cause increased pain or swelling in your joint, decrease the amount until you are comfortable again. Then slowly increase the exercises. Call your caregiver if you have problems or questions.  ° °HOME CARE INSTRUCTIONS  °Most of the following instructions are designed to prevent the dislocation of your new hip.  °Remove items at home which could result in a fall. This includes throw rugs or furniture in walking pathways.  °Continue medications as instructed at time of discharge. °· You may have some home medications which will be placed on hold until you complete the course of blood thinner medication. °· You may start showering once you are discharged home. Do not remove your dressing. °Do not put on socks or shoes without following the instructions of your caregivers.   °Sit on chairs with arms. Use the chair arms to help push yourself up when arising.  °Arrange for the use of a toilet seat elevator so you are not sitting low.  °· Walk with walker as instructed.  °You may resume a sexual relationship in one month or when given the OK by your caregiver.  °Use walker as long as suggested by your caregivers.  °You may put full weight on your legs and walk as much as is comfortable. °Avoid periods of inactivity such as sitting longer than an hour when not asleep. This helps prevent  blood clots.  °You may return to work once you are cleared by your surgeon.  °Do not drive a car for 6 weeks or until released by your surgeon.  °Do not drive while taking narcotics.  °Wear elastic stockings for two weeks following surgery during the day but you may remove then at night.  °Make sure you keep all of your appointments after your operation with all of your doctors and caregivers. You should call the office at the above phone number and make an appointment for approximately two weeks after the date of your surgery. °Please pick up a stool softener and laxative for home use as long as you are requiring pain medications. °· ICE to the affected hip every three hours for 30 minutes at a time and then as needed for pain and swelling. Continue to use ice on the hip for pain and swelling from surgery. You may notice swelling that will progress down to the foot and ankle.  This is normal after surgery.  Elevate the leg when you are not up walking on it.   °It is important for you to complete the blood thinner medication as prescribed by your doctor. °· Continue to use the breathing machine which will help keep your temperature down.  It is common for your temperature to cycle up and down following surgery, especially at night when you are not up moving around and exerting yourself.  The breathing machine keeps your lungs expanded and your temperature down. ° °RANGE OF MOTION AND STRENGTHENING EXERCISES  °These exercises are   designed to help you keep full movement of your hip joint. Follow your caregiver's or physical therapist's instructions. Perform all exercises about fifteen times, three times per day or as directed. Exercise both hips, even if you have had only one joint replacement. These exercises can be done on a training (exercise) mat, on the floor, on a table or on a bed. Use whatever works the best and is most comfortable for you. Use music or television while you are exercising so that the exercises  are a pleasant break in your day. This will make your life better with the exercises acting as a break in routine you can look forward to.  °Lying on your back, slowly slide your foot toward your buttocks, raising your knee up off the floor. Then slowly slide your foot back down until your leg is straight again.  °Lying on your back spread your legs as far apart as you can without causing discomfort.  °Lying on your side, raise your upper leg and foot straight up from the floor as far as is comfortable. Slowly lower the leg and repeat.  °Lying on your back, tighten up the muscle in the front of your thigh (quadriceps muscles). You can do this by keeping your leg straight and trying to raise your heel off the floor. This helps strengthen the largest muscle supporting your knee.  °Lying on your back, tighten up the muscles of your buttocks both with the legs straight and with the knee bent at a comfortable angle while keeping your heel on the floor.  ° °SKILLED REHAB INSTRUCTIONS: °If the patient is transferred to a skilled rehab facility following release from the hospital, a list of the current medications will be sent to the facility for the patient to continue.  When discharged from the skilled rehab facility, please have the facility set up the patient's Home Health Physical Therapy prior to being released. Also, the skilled facility will be responsible for providing the patient with their medications at time of release from the facility to include their pain medication and their blood thinner medication. If the patient is still at the rehab facility at time of the two week follow up appointment, the skilled rehab facility will also need to assist the patient in arranging follow up appointment in our office and any transportation needs. ° °MAKE SURE YOU:  °Understand these instructions.  °Will watch your condition.  °Will get help right away if you are not doing well or get worse. ° °Pick up stool softner and  laxative for home use following surgery while on pain medications. °Do not remove your dressing. °The dressing is waterproof--it is OK to take showers. °Continue to use ice for pain and swelling after surgery. °Do not use any lotions or creams on the incision until instructed by your surgeon. °Total Hip Protocol. ° ° °

## 2017-03-13 NOTE — Transfer of Care (Signed)
Immediate Anesthesia Transfer of Care Note  Patient: Monica [name]  Procedure(s) Performed: Procedure(s): RIGHT TOTAL HIP ARTHROPLASTY (Right)  Patient Location: PACU  Anesthesia Type:General  Level of Consciousness: sedated  Airway & Oxygen Therapy: Patient Spontanous Breathing and Patient connected to face mask oxygen  Post-op Assessment: Report given to RN and Post -op Vital signs reviewed and stable  Post vital signs: Reviewed and stable  Last Vitals:  Vitals:   03/13/17 1336 03/13/17 1938  BP: (!) 141/71 126/63  Pulse: 82 (!) 101  Resp: 18 11  Temp: 36.9 C 36.8 C    Last Pain:  Vitals:   03/13/17 1938  TempSrc:   PainSc: 0-No pain      Patients Stated Pain Goal: 2 (03/12/17 1243)  Complications: No apparent anesthesia complications

## 2017-03-13 NOTE — Progress Notes (Signed)
Subjective:  She denies any chest pain or shortness of breath.  Complains of right hip pain.  Objective:  Vital Signs in the last 24 hours: Temp:  [98 F (36.7 C)-100.1 F (37.8 C)] 98.8 F (37.1 C) (04/06 0510) Pulse Rate:  [68-101] 91 (04/06 1116) Resp:  [17-18] 18 (04/06 1116) BP: (73-155)/(46-86) 139/46 (04/06 1116) SpO2:  [95 %-97 %] 96 % (04/06 1116) Weight:  [129 lb 10.1 oz (58.8 kg)-130 lb 1.1 oz (59 kg)] 129 lb 10.1 oz (58.8 kg) (04/06 0510)  Intake/Output from previous day: 04/05 0701 - 04/06 0700 In: 480 [P.O.:480] Out: -69  Intake/Output from this shift: Total I/O In: 0  Out: 350 [Urine:350]  Physical Exam: Neck: no adenopathy, no carotid bruit, no JVD and supple, symmetrical, trachea midline Lungs: clear to auscultation bilaterally Heart: regular rate and rhythm, S1, S2 normal and soft systolic murmur noted Abdomen: soft, non-tender; bowel sounds normal; no masses,  no organomegaly Extremities: extremities normal, atraumatic, no cyanosis or edema  Lab Results:  Recent Labs  03/12/17 0312 03/13/17 0243  WBC 8.2 8.4  HGB 9.9* 10.7*  PLT 252 262    Recent Labs  03/12/17 0312 03/13/17 0243  NA 132* 135  K 4.5 4.3  CL 100* 96*  CO2 19* 24  GLUCOSE 95 103*  BUN 46* 22*  CREATININE 7.04* 4.54*   No results for input(s): TROPONINI in the last 72 hours.  Invalid input(s): CK, MB Hepatic Function Panel  Recent Labs  03/13/17 0243  ALBUMIN 3.0*   No results for input(s): CHOL in the last 72 hours. No results for input(s): PROTIME in the last 72 hours.  Imaging: Imaging results have been reviewed and No results found.  Cardiac Studies:  Assessment/Plan:  Status post fall sustaining right femoral neck fracture Status post recent septal wall myocardial infarction with mildly depressed LV systolic function, EF approximately 50% Hypertension End-stage renal disease on hemodialysis History of paroxysmal A. fib flutter History of peptic ulcer  disease GERD Anemia of chronic disease Elevated blood sugar rule out diabetes mellitus Depression Anxiety disorder Dementia Plan Continue present management  Schedule for right hip surgery later today. Dr. Algie Coffer on call for weekend  LOS: 4 days    Rinaldo Cloud 03/13/2017, 12:19 PM

## 2017-03-13 NOTE — Progress Notes (Signed)
[facility] Progress Note   Subjective: echo pending  Vitals:   03/12/17 1637 03/12/17 2000 03/13/17 0510 03/13/17 1116  BP: (!) 155/74 140/72 134/75 (!) 139/46  Pulse: 79 100 99 91  Resp: Temp: 98 F (36.7 C) 100.1 F (37.8 C) 98.8 F (37.1 C)   TempSrc: Oral Oral Oral   SpO2: 96% 97% 96% 96%  Weight: 59 kg (130 lb 1.1 oz)  58.8 kg (129 lb 10.1 oz)   Height:        Inpatient medications: . acetaminophen  1,000 mg Intravenous To OR  . ALPRAZolam  0.5 mg Oral BID  . aspirin EC  81 mg Oral Daily  . atorvastatin  40 mg Oral q1800  . calcitRIOL  0.75 mcg Oral Q M,W,F-HD  . calcium acetate  667 mg Oral TID WC  .  ceFAZolin (ANCEF) IV  2 g Intravenous To SS-Surg  . [START ON 03/16/2017] darbepoetin (ARANESP) injection - DIALYSIS  100 mcg Intravenous Q Mon-HD  . famotidine  20 mg Oral Daily  . feeding supplement (NEPRO CARB STEADY)  237 mL Oral BID BM  . ferric gluconate (FERRLECIT/NULECIT) IV  125 mg Intravenous Q M,W,F-HD  . metoprolol tartrate  25 mg Oral BID  . multivitamin  1 tablet Oral QHS  . nitroGLYCERIN  0.5 inch Topical Q6H  . penicillin v potassium  500 mg Oral Q8H  . povidone-iodine  2 application Topical Once  . sodium chloride flush  3 mL Intravenous Q12H  . tranexamic acid (CYKLOKAPRON) topical -INTRAOP  2,000 mg Topical To OR  . vancomycin  1,000 mg Intravenous To SS-Surg  . venlafaxine XR  37.5 mg Oral QHS   . heparin     acetaminophen **OR** acetaminophen, calcium acetate, fentaNYL (SUBLIMAZE) injection, nitroGLYCERIN, polyethylene glycol, tetrahydrozoline  Exam: Alert, no distress, Ox 3 No jvd Chest clear bilat RRR no mrg ABd soft ntnd Ext no LE edema L IJ cath  Dialysis: [facility] MWF  4h  59kg  2/2 bath Hep 5000  L IJ - M75 ug last 3/14 - last Hb 10, tsat 175 ferr 346 pth 1022      Assessment: 1. Fall/ R hip fx - per ortho 2. +trop/ acute septal MI - no CP, EKG evolving septal ant MI.  Per cards, on heparin,  echo pend 3. ESRD HD MWF 4. Volume - is at dry wt, no chf 5. Anemia of CKD - cont fe/ esa 6. MBD - cont meds  Plan - HD Sat off sched then resume MWF   Vinson Moselle MD [facility] pager [phone]   03/13/2017, 1:36 PM    Recent Labs Lab 03/10/17 0132  03/11/17 0437 03/12/17 0312 03/13/17 0243  NA 138  < > 132* 132* 135  K 6.1*  < > 3.9 4.5 4.3  CL 111  < > 97* 100* 96*  CO2 11*  < > 21* 19* 24  GLUCOSE 127*  < > 94 95 103*  BUN 72*  < > 32* 46* 22*  CREATININE 9.22*  < > 5.43* 7.04* 4.54*  CALCIUM 9.6  < > 8.3* 8.6* 8.9  PHOS 6.7*  --   --  6.4* 4.3  < > = values in this interval not displayed.  Recent Labs Lab 03/09/17 1407 03/10/17 0132 03/12/17 0312 03/13/17 0243  AST 21  --   --   --   ALT 15  --   --   --  ALKPHOS 67  --   --   --   BILITOT 0.7  --   --   --   PROT 7.9  --   --   --   ALBUMIN 3.8 3.7 3.0* 3.0*    Recent Labs Lab 03/09/17 1407  03/11/17 0437 03/12/17 0312 03/13/17 0243  WBC 10.2  < > 7.7 8.2 8.4  NEUTROABS 9.2*  --   --   --   --   HGB 11.4*  < > 10.4* 9.9* 10.7*  HCT 36.1  < > 32.3* 31.2* 33.3*  MCV 78.0  < > 75.1* 75.5* 75.3*  PLT 276  < > 233 252 262  < > = values in this interval not displayed. Iron/TIBC/Ferritin/ %Sat    Component Value Date/Time   IRON 74 10/23/2016 1405   TIBC 231 (L) 10/23/2016 1405   FERRITIN 159 10/23/2016 1405   IRONPCTSAT 32 (H) 10/23/2016 1405

## 2017-03-13 NOTE — Anesthesia Procedure Notes (Signed)
Procedure Name: Intubation Date/Time: 03/13/2017 5:32 PM Performed by: Manus Gunning, Buford Gayler J Pre-anesthesia Checklist: Patient identified, Emergency Drugs available, Suction available, Patient being monitored and Timeout performed Patient Re-evaluated:Patient Re-evaluated prior to inductionOxygen Delivery Method: Circle system utilized Preoxygenation: Pre-oxygenation with 100% oxygen Intubation Type: IV induction and Rapid sequence Ventilation: Mask ventilation without difficulty Laryngoscope Size: Mac and 3 Grade View: Grade I Tube type: Oral Tube size: 7.5 mm Number of attempts: 1 Placement Confirmation: ETT inserted through vocal cords under direct vision,  positive ETCO2 and breath sounds checked- equal and bilateral Secured at: 21 cm Tube secured with: Tape Dental Injury: Teeth and Oropharynx as per pre-operative assessment

## 2017-03-13 NOTE — Progress Notes (Signed)
  Echocardiogram 2D Echocardiogram has been performed.  Janalyn Harder 03/13/2017, 9:35 AM

## 2017-03-14 DIAGNOSIS — I10 Essential (primary) hypertension: Secondary | ICD-10-CM

## 2017-03-14 LAB — BASIC METABOLIC PANEL
Anion gap: 13 (ref 5–15)
BUN: 33 mg/dL — AB (ref 6–20)
CHLORIDE: 101 mmol/L (ref 101–111)
CO2: 20 mmol/L — ABNORMAL LOW (ref 22–32)
Calcium: 8.5 mg/dL — ABNORMAL LOW (ref 8.9–10.3)
Creatinine, Ser: 6.25 mg/dL — ABNORMAL HIGH (ref 0.44–1.00)
GFR calc Af Amer: 6 mL/min — ABNORMAL LOW (ref >60)
GFR, EST NON AFRICAN AMERICAN: 6 mL/min — AB (ref >60)
GLUCOSE: 92 mg/dL (ref 65–99)
POTASSIUM: 4.4 mmol/L (ref 3.5–5.1)
Sodium: 134 mmol/L — ABNORMAL LOW (ref 135–145)

## 2017-03-14 MED ORDER — BLISTEX MEDICATED EX OINT
TOPICAL_OINTMENT | CUTANEOUS | Status: DC | PRN
  Administered 2017-03-14: 1 via TOPICAL
  Filled 2017-03-14 (×2): qty 6.3

## 2017-03-14 MED ORDER — HYDROCODONE-ACETAMINOPHEN 5-325 MG PO TABS
1.0000 | ORAL_TABLET | Freq: Four times a day (QID) | ORAL | Status: DC | PRN
  Administered 2017-03-14 – 2017-03-17 (×7): 1 via ORAL
  Filled 2017-03-14 (×6): qty 1

## 2017-03-14 NOTE — Evaluation (Signed)
Physical Therapy Evaluation Patient Details Name: Monica [name] MRN: [id number] DOB: 1932-11-17 Today's Date: 03/14/2017   History of Present Illness  83 y.o. female presenting s/p right hip hemiarthroplasty secondary to R proximal femoral neck fracture resulting from ground level fall at home. Pt with PMH significant for anxiety, arthritis, dementia, depression, ESRD, ETOH (quit in 1980s), stomach ulcers and pancreatic cysts.   Clinical Impression  Pt presents with decreased strength, balance and activity tolerance secondary to above. Pt limited by pain today, however, tolerated OOB mobility to chair. PTA pt lived alone and was mod-I, however, now requires physical assist and frequent repeated verbal cues for all mobility. Recommend d/c to SNF for safe transition home. Acute PT will follow.     Follow Up Recommendations SNF;Supervision/Assistance - 24 hour    Equipment Recommendations  None recommended by PT    Recommendations for Other Services       Precautions / Restrictions Precautions Precautions: Fall Precaution Booklet Issued: No Precaution Comments: reviewed precautions with pt who verbalized understanding Restrictions Weight Bearing Restrictions: Yes RLE Weight Bearing: Weight bearing as tolerated      Mobility  Bed Mobility Overal bed mobility: Needs Assistance Bed Mobility: Supine to Sit     Supine to sit: Mod assist;HOB elevated Sit to supine: Mod assist   General bed mobility comments: verbal cues to bring LE to EOB. pt required physical assist for RLE and repeated cues to stay on task. Pt able to elevate trunk upright without physical assist. Pt unable to scoot to EOB despite repeated verbal cues to stay on task. Use of bed pad to square hips to EOB  Transfers Overall transfer level: Needs assistance Equipment used: Rolling walker (2 wheeled) Transfers: Stand Pivot Transfers   Stand pivot transfers: Min assist       General transfer comment: minA to power  up trunk. verbal cues for hand placement and safety with RW. verbal cues to scoot hips to EOB and for forward trunk flexion to initiate stand. pt required increased time to come to stand with verbal cues for upright posture.   Ambulation/Gait             General Gait Details: limited to steps to chair with minA for negotiation and safety with RW. verbal cues for sequencing of steps toward chair and with max encouragement pt able to perform standing marching x 20 reps  Stairs            Wheelchair Mobility    Modified Rankin (Stroke Patients Only)       Balance Overall balance assessment: Needs assistance Sitting-balance support: Feet supported;No upper extremity supported Sitting balance-Leahy Scale: Fair     Standing balance support: Bilateral upper extremity supported Standing balance-Leahy Scale: Poor Standing balance comment: reliant on RW                             Pertinent Vitals/Pain Pain Assessment: 0-10 Pain Score: 6  Pain Location: R hip Pain Descriptors / Indicators: Discomfort Pain Intervention(s): Limited activity within patient's tolerance;Monitored during session;Repositioned    Home Living Family/patient expects to be discharged to:: Skilled nursing facility Living Arrangements: Alone               Additional Comments: pt lives alone in 1 story home with level entry. does not drive. does not have anyone to provide assist at d/c    Prior Function Level of Independence: Independent with assistive device(s)  Comments: pt I, however, used w/c for all mobility. unsure of accuracy of this information due to pt h/o dementia     Hand Dominance   Dominant Hand: Right    Extremity/Trunk Assessment   Upper Extremity Assessment Upper Extremity Assessment: Generalized weakness    Lower Extremity Assessment Lower Extremity Assessment: Generalized weakness    Cervical / Trunk Assessment Cervical / Trunk Assessment:  Kyphotic  Communication   Communication: No difficulties  Cognition Arousal/Alertness: Awake/alert Behavior During Therapy: WFL for tasks assessed/performed Overall Cognitive Status: History of cognitive impairments - at baseline                                 General Comments: h/o dementia      General Comments      Exercises     Assessment/Plan    PT Assessment Patient needs continued PT services  PT Problem List Decreased strength;Decreased range of motion;Decreased activity tolerance;Decreased balance;Decreased mobility;Decreased coordination;Decreased cognition;Decreased knowledge of use of DME;Decreased safety awareness;Decreased knowledge of precautions;Pain       PT Treatment Interventions DME instruction;Gait training;Stair training;Functional mobility training;Therapeutic activities;Therapeutic exercise;Neuromuscular re-education;Cognitive remediation;Patient/family education;Balance training    PT Goals (Current goals can be found in the Care Plan section)  Acute Rehab PT Goals Patient Stated Goal: stop falling PT Goal Formulation: With patient Time For Goal Achievement: 03/28/17 Potential to Achieve Goals: Fair    Frequency Min 3X/week   Barriers to discharge Decreased caregiver support      Co-evaluation               End of Session Equipment Utilized During Treatment: Gait belt Activity Tolerance: Patient tolerated treatment well Patient left: in chair;with call bell/phone within reach Nurse Communication: Mobility status PT Visit Diagnosis: Unsteadiness on feet (R26.81);Other abnormalities of gait and mobility (R26.89);History of falling (Z91.81);Muscle weakness (generalized) (M62.81);Pain Pain - Right/Left: Right Pain - part of body: Hip    Time: 1610-9604 PT Time Calculation (min) (ACUTE ONLY): 24 min   Charges:   PT Evaluation $PT Eval Moderate Complexity: 1 Procedure PT Treatments $Therapeutic Activity: 8-22 mins   PT  G Codes:        Lane Hacker, SPT Acute Rehab SPT [phone]    Lane Hacker 03/14/2017, 3:12 PM

## 2017-03-14 NOTE — Progress Notes (Signed)
Ref: No PCP Per Patient   Subjective:  Feeling better. Afebrile. Sinus rhythm.   Objective:  Vital Signs in the last 24 hours: Temp:  [98.1 F (36.7 C)-98.4 F (36.9 C)] 98.4 F (36.9 C) (04/07 0844) Pulse Rate:  [61-101] 75 (04/07 0844) Cardiac Rhythm: Normal sinus rhythm (04/07 0700) Resp:  [11-18] 18 (04/07 0844) BP: (116-154)/(55-71) 128/58 (04/07 0844) SpO2:  [93 %-100 %] 98 % (04/07 0844) FiO2 (%):  [0 %] 0 % (04/06 2024)  Physical Exam: BP Readings from Last 1 Encounters:  03/14/17 (!) 128/58    Wt Readings from Last 1 Encounters:  03/13/17 58.8 kg (129 lb 10.1 oz)    Weight change:  Body mass index is 24.49 kg/m. HEENT: [place]/AT, Eyes-[place], PERL, EOMI, Conjunctiva-Pale pink, Sclera-Non-icteric Neck: No JVD, No bruit, Trachea midline. Lungs:  Clearing, Bilateral. Cardiac:  Regular rhythm, normal S1 and S2, no S3. II/VI systolic murmur. Abdomen:  Soft, non-tender. BS present. Extremities:  No edema present. No cyanosis. No clubbing. Right hip surgical scar. CNS: AxOx3, Cranial nerves grossly intact, moves all 4 extremities.  Skin: Warm and dry.   Intake/Output from previous day: 04/06 0701 - 04/07 0700 In: 750 [I.V.:500; IV Piggyback:250] Out: 650 [Urine:450; Blood:200]    Lab Results: BMET    Component Value Date/Time   NA 134 (L) 03/14/2017 0223   NA 135 03/13/2017 0243   NA 132 (L) 03/12/2017 0312   K 4.4 03/14/2017 0223   K 4.3 03/13/2017 0243   K 4.5 03/12/2017 0312   CL 101 03/14/2017 0223   CL 96 (L) 03/13/2017 0243   CL 100 (L) 03/12/2017 0312   CO2 20 (L) 03/14/2017 0223   CO2 24 03/13/2017 0243   CO2 19 (L) 03/12/2017 0312   GLUCOSE 92 03/14/2017 0223   GLUCOSE 103 (H) 03/13/2017 0243   GLUCOSE 95 03/12/2017 0312   BUN 33 (H) 03/14/2017 0223   BUN 22 (H) 03/13/2017 0243   BUN 46 (H) 03/12/2017 0312   CREATININE 6.25 (H) 03/14/2017 0223   CREATININE 4.54 (H) 03/13/2017 0243   CREATININE 7.04 (H) 03/12/2017 0312   CALCIUM 8.5 (L)  03/14/2017 0223   CALCIUM 8.9 03/13/2017 0243   CALCIUM 8.6 (L) 03/12/2017 0312   GFRNONAA 6 (L) 03/14/2017 0223   GFRNONAA 8 (L) 03/13/2017 0243   GFRNONAA 5 (L) 03/12/2017 0312   GFRAA 6 (L) 03/14/2017 0223   GFRAA 9 (L) 03/13/2017 0243   GFRAA 6 (L) 03/12/2017 0312   CBC    Component Value Date/Time   WBC 8.4 03/13/2017 0243   RBC 4.42 03/13/2017 0243   HGB 10.7 (L) 03/13/2017 0243   HCT 33.3 (L) 03/13/2017 0243   PLT 262 03/13/2017 0243   MCV 75.3 (L) 03/13/2017 0243   MCH 24.2 (L) 03/13/2017 0243   MCHC 32.1 03/13/2017 0243   RDW 15.8 (H) 03/13/2017 0243   LYMPHSABS 0.5 (L) 03/09/2017 1407   MONOABS 0.5 03/09/2017 1407   EOSABS 0.0 03/09/2017 1407   BASOSABS 0.0 03/09/2017 1407   HEPATIC Function Panel  Recent Labs  08/13/16 1459 01/31/17 1752 03/09/17 1407  PROT 8.7* 6.9 7.9   HEMOGLOBIN A1C No components found for: HGA1C,  MPG CARDIAC ENZYMES Lab Results  Component Value Date   CKTOTAL 170 03/09/2017   TROPONINI 1.31 (HH) 03/10/2017   TROPONINI 1.33 (HH) 03/10/2017   TROPONINI 1.01 (HH) 03/09/2017   BNP No results for input(s): PROBNP in the last 8760 hours. TSH  Recent Labs  10/23/16 1405  03/10/17 0132  TSH 1.356 4.200   CHOLESTEROL No results for input(s): CHOL in the last 8760 hours.  Scheduled Meds: . ALPRAZolam  0.5 mg Oral BID  . aspirin EC  81 mg Oral Daily  . atorvastatin  40 mg Oral q1800  . calcitRIOL  0.75 mcg Oral Q M,W,F-HD  . calcium acetate  667 mg Oral TID WC  . [START ON 03/16/2017] darbepoetin (ARANESP) injection - DIALYSIS  100 mcg Intravenous Q Mon-HD  . famotidine  20 mg Oral Daily  . feeding supplement (NEPRO CARB STEADY)  237 mL Oral BID BM  . ferric gluconate (FERRLECIT/NULECIT) IV  125 mg Intravenous Q M,W,F-HD  . heparin subcutaneous  5,000 Units Subcutaneous Q8H  . metoprolol tartrate  25 mg Oral BID  . multivitamin  1 tablet Oral QHS  . nitroGLYCERIN  0.5 inch Topical Q6H  . penicillin v potassium  500 mg Oral  Q8H  . sodium chloride flush  3 mL Intravenous Q12H  . venlafaxine XR  37.5 mg Oral QHS   Continuous Infusions: . sodium chloride 10 mL/hr at 03/13/17 1608   PRN Meds:.acetaminophen **OR** acetaminophen, calcium acetate, fentaNYL (SUBLIMAZE) injection, HYDROcodone-acetaminophen, lip balm, menthol-cetylpyridinium **OR** phenol, nitroGLYCERIN, ondansetron **OR** ondansetron (ZOFRAN) IV, polyethylene glycol, tetrahydrozoline  Assessment/Plan: Right femoral neck fracture S/P right hip hemiarthroplasty Recent septal wall MI Hypertension ESRD on hemodialysis Paroxysmal atrial flutter GERD Anemia of chronic disease Dementia  Increase activity per surgery Continue medical treatment    LOS: 5 days    Orpah Cobb  MD  03/14/2017, 11:19 AM

## 2017-03-14 NOTE — Progress Notes (Signed)
Family Medicine Teaching Service Daily Progress Note Intern Pager: [phone]  Patient name: Monica [name] Medical record number: [id number] Date of birth: 04-13-1932 Age: 83 y.o. Gender: female  Primary Care Provider: No PCP Per Patient Consultants: Cardiology, Ortho Code Status: Full  Chief Complaint: AMS and right hip pain  Assessment and Plan: Monica [name] is a 83 y.o. female presenting with AMS and right hip pain . PMH is significant for HTN, ESRD with HD MWF, Anxiety, Dementia  STEMI: EKG on admit with Q waves in V1 to V3 and ST elevation in anterior leads, most recent EKG now showing evolving septal ant MI. Troponins elevated to peak 1.31. -Telemetry -ASA 81 -Per surgery note, subq Heprarin drip to be resumed this AM and after 24 hours, resume heparin drip with no bolus -Lopressor 50 mg BID, Atorvastatin 40 mg   Right Femoral Neck Fracture s/p R hip hemiarthroplasty 03/13/17: Likely from mechanical fall, unwitnessed. Right hip XRAY showing severely displaced right femoral neck fracture. Surgery would be high risk given active cardiac issue.  -Orthopedics  Following, Appreciate recommendations -Tylenol 650 mg q6h prn mild pain and Norco q6h prn, fentanyl 50 g q2h prn for breakthrough - vitamin D low (13.8), continue calcitriol supplementation. - nutrition consult - avoid PPI, continue H2 blocker instead  - WBAT, PT c/s  ESRD: HD on MWF - Continue home phoslo - Nephrology following, HD next 4/7 - Daily renal function panel  Anemia  Baseline ~11. Last iron panel 10/2016 that showed adequate iron, folate, B12, ferritin. Likely multifactorial from ESRD and R hip fracture. Hgb stable previously 11.4>10.7>10.4>9.9>10.7. Repeat pending this AM - monitor CBC  Depression and Anxiety:  - Continue home Effexor and Xanax  Toothache Per speech evaluation, edema R upper maxilla with last molar sensitive to touch. - penicillin V - outpatient follow up with dentist  Acute toxic  metabolic encephalopathy, resolved  Dementia:  Multifactorial likely in setting of missed dialysis prior to admission. Now fully oriented and appropriate. TSH WNL at 4.2. Baseline dementia per chart review as well as per discussion with her neighbor. Has some hallucinations as well as some lip tremor and hand and feet tremors,?Lewy body dementia - monitor mental status post-op  FEN/GI: renal diet  Prophylaxis: subq heparin  Disposition: pending stability after surgery  Subjective:  R hip is feeling better.  Able to move it some.  Eating well. No CP or SOB.  Objective: Temp:  [98.1 F (36.7 C)-98.4 F (36.9 C)] 98.2 F (36.8 C) (04/07 0450) Pulse Rate:  [61-101] 86 (04/07 0450) Resp:  [11-18] 18 (04/07 0450) BP: (116-154)/(46-71) 116/62 (04/07 0450) SpO2:  [93 %-100 %] 100 % (04/07 0450) FiO2 (%):  [0 %] 0 % (04/06 2024) Physical Exam: General: NAD, resting comfortably in bed Cardiovascular: RRR, no m/r/g Respiratory: CTAB, no W/R/R Abdomen: nontender, nondistended Extremities: R hip with bandage in place, no edema Neuro: alert and oriented  Laboratory:  Recent Labs Lab 03/11/17 0437 03/12/17 0312 03/13/17 0243  WBC 7.7 8.2 8.4  HGB 10.4* 9.9* 10.7*  HCT 32.3* 31.2* 33.3*  PLT 233 252 262    Recent Labs Lab 03/09/17 1407  03/12/17 0312 03/13/17 0243 03/14/17 0223  NA 137  < > 132* 135 134*  K 5.8*  < > 4.5 4.3 4.4  CL 108  < > 100* 96* 101  CO2 14*  < > 19* 24 20*  BUN 67*  < > 46* 22* 33*  CREATININE 9.24*  < > 7.04* 4.54*  6.25*  CALCIUM 9.6  < > 8.6* 8.9 8.5*  PROT 7.9  --   --   --   --   BILITOT 0.7  --   --   --   --   ALKPHOS 67  --   --   --   --   ALT 15  --   --   --   --   AST 21  --   --   --   --   GLUCOSE 154*  < > 95 103* 92  < > = values in this interval not displayed.   Echo: EF 50-55%, severe basal septal hypertrophy, distal septal hypokinesis, moderate mitral regurg  Imaging/Diagnostic Tests: Pelvis Portable  Result Date:  03/13/2017 CLINICAL DATA:  Postop check.  Hip arthroplasty EXAM: PORTABLE PELVIS 1-2 VIEWS COMPARISON:  Fluoroscopy from earlier today FINDINGS: Bipolar right hip hemiarthroplasty is located. No periprosthetic fracture noted. No acute finding in the left hip or pelvis. IMPRESSION: Right hip arthroplasty without adverse finding. Electronically Signed   By: Marnee Spring M.D.   On: 03/13/2017 20:54   Dg C-arm 1-60 Min  Result Date: 03/13/2017 CLINICAL DATA:  Right anterior hip replacement. EXAM: OPERATIVE right HIP (WITH PELVIS IF PERFORMED)  VIEWS TECHNIQUE: Fluoroscopic spot image(s) were submitted for interpretation post-operatively. COMPARISON:  03/09/2017 FINDINGS: Examination demonstrates placement of a right hip prosthesis intact and normally located. Remainder the exam is unchanged. IMPRESSION: Right hip prosthesis intact. Electronically Signed   By: Elberta Fortis M.D.   On: 03/13/2017 19:14   Dg Hip Operative Unilat W Or W/o Pelvis Right  Result Date: 03/13/2017 CLINICAL DATA:  Right anterior hip replacement. EXAM: OPERATIVE right HIP (WITH PELVIS IF PERFORMED)  VIEWS TECHNIQUE: Fluoroscopic spot image(s) were submitted for interpretation post-operatively. COMPARISON:  03/09/2017 FINDINGS: Examination demonstrates placement of a right hip prosthesis intact and normally located. Remainder the exam is unchanged. IMPRESSION: Right hip prosthesis intact. Electronically Signed   By: Elberta Fortis M.D.   On: 03/13/2017 19:14     Erasmo Downer, MD, MPH 03/14/2017, 8:27 AM PGY-3, [facility] FPTS Intern pager: [phone], text pages welcome

## 2017-03-14 NOTE — Evaluation (Signed)
Occupational Therapy Evaluation Patient Details Name: Monica [name] MRN: [id number] DOB: [birth year] Today's Date: 03/14/2017    History of Present Illness 83 y.o. female presenting s/p right hip hemiarthroplasty secondary to R proximal femoral neck fracture resulting from ground level fall at home. Pt with PMH significant for anxiety, arthritis, dementia, depression, ESRD, ETOH (quit in 1980s), stomach ulcers and pancreatic cysts.    Clinical Impression   Pt is s/p THA resulting in the deficits listed below (see OT Problem List).  Pt will benefit from skilled OT to increase their safety and independence with ADL and functional mobility for ADL to facilitate discharge to venue listed below.        Follow Up Recommendations  SNF    Equipment Recommendations  None recommended by OT       Precautions / Restrictions Precautions Precautions: Anterior Hip;Fall Precaution Booklet Issued: No Precaution Comments: reviewed precautions with pt who verbalized understanding Restrictions Weight Bearing Restrictions: Yes RLE Weight Bearing: Weight bearing as tolerated      Mobility Bed Mobility Overal bed mobility: Needs Assistance Bed Mobility: Sit to Supine     Supine to sit: Mod assist;HOB elevated Sit to supine: Mod assist   General bed mobility comments: verbal cues to bring LE to EOB. pt required physical assist for RLE and repeated cues to stay on task. Pt able to elevate trunk upright without physical assist. Pt unable to scoot to EOB despite repeated verbal cues to stay on task. Use of bed pad to square hips to EOB  Transfers Overall transfer level: Needs assistance Equipment used: Rolling walker (2 wheeled) Transfers: Sit to/from [employer]   Stand pivot transfers: Min assist       General transfer comment: minA to power up trunk. verbal cues for hand placement and safety with RW. verbal cues to scoot hips to EOB and for forward trunk flexion to initiate  stand. pt required increased time to come to stand with verbal cues for upright posture.     Balance Overall balance assessment: Needs assistance Sitting-balance support: Feet supported;No upper extremity supported Sitting balance-Leahy Scale: Fair     Standing balance support: Bilateral upper extremity supported Standing balance-Leahy Scale: Poor Standing balance comment: reliant on RW                           ADL either performed or assessed with clinical judgement   ADL Overall ADL's : Needs assistance/impaired Eating/Feeding: Minimal assistance;Sitting   Grooming: Minimal assistance;Sitting   Upper Body Bathing: Minimal assistance;Sitting   Lower Body Bathing: Maximal assistance;Sit to/from stand;Cueing for safety;Cueing for sequencing   Upper Body Dressing : Minimal assistance   Lower Body Dressing: Maximal assistance;Sit to/from stand;Cueing for safety;Cueing for compensatory techniques;Cueing for sequencing   Toilet Transfer: Moderate assistance;BSC;Cueing for sequencing;Cueing for safety;RW   Toileting- Clothing Manipulation and Hygiene: Maximal assistance;Sit to/from stand;Cueing for sequencing;Cueing for safety       Functional mobility during ADLs: Moderate assistance;Cueing for safety;Cueing for sequencing                    Pertinent Vitals/Pain Pain Assessment: 0-10 Pain Score: 4  Pain Location: R hip Pain Descriptors / Indicators: Discomfort;Sore Pain Intervention(s): Monitored during session;Repositioned     Hand Dominance Right   Extremity/Trunk Assessment Upper Extremity Assessment Upper Extremity Assessment: Generalized weakness   Lower Extremity Assessment Lower Extremity Assessment: Generalized weakness   Cervical / Trunk Assessment Cervical / Trunk Assessment:  Kyphotic   Communication Communication Communication: No difficulties   Cognition Arousal/Alertness: Awake/alert Behavior During Therapy: WFL for tasks  assessed/performed Overall Cognitive Status: History of cognitive impairments - at baseline                                 General Comments: h/o dementia   General Comments               Home Living Family/patient expects to be discharged to:: Skilled nursing facility                                 Additional Comments: pt lives alone in 1 story home with level entry. does not drive. does not have anyone to provide assist at d/c      Prior Functioning/Environment Level of Independence: Independent with assistive device(s)        Comments: pt I, however, used w/c for all mobility. unsure of accuracy of this information due to pt h/o dementia        OT Problem List: Decreased strength;Decreased activity tolerance;Pain;Decreased safety awareness;Decreased knowledge of use of DME or AE;Impaired balance (sitting and/or standing)      OT Treatment/Interventions: Self-care/ADL training;DME and/or AE instruction;Patient/family education    OT Goals(Current goals can be found in the care plan section) Acute Rehab OT Goals Patient Stated Goal: stop falling OT Goal Formulation: With patient Time For Goal Achievement: 03/28/17 Potential to Achieve Goals: Good  OT Frequency: Min 2X/week              End of Session Equipment Utilized During Treatment: Rolling walker;Gait belt Nurse Communication: Mobility status  Activity Tolerance: Other (comment) (limited by nausea) Patient left: in bed;with call bell/phone within reach;with nursing/sitter in room  OT Visit Diagnosis: Unsteadiness on feet (R26.81);Pain;Muscle weakness (generalized) (M62.81);Repeated falls (R29.6) Pain - Right/Left: Right Pain - part of body: Hip                Time: 1250-1301 OT Time Calculation (min): 11 min Charges:  OT General Charges $OT Visit: 1 Procedure OT Evaluation $OT Eval Moderate Complexity: 1 Procedure G-Codes:     Lise Auer, OT [phone]  Einar Crow D 03/14/2017, 1:10 PM

## 2017-03-14 NOTE — Progress Notes (Signed)
[facility] Progress Note   Subjective: post-op R hip repair done yesterday, having some nausea, no other c/o. No sob / cough  Vitals:   03/13/17 2300 03/14/17 0450 03/14/17 0844 03/14/17 1419  BP: (!) 122/55 116/62 (!) 128/58 (!) 108/56  Pulse: 65 86 75 87  Resp:  Temp:  98.2 F (36.8 C) 98.4 F (36.9 C) 98.8 F (37.1 C)  TempSrc:  Oral Oral Oral  SpO2:  100% 98% 94%  Weight:      Height:        Inpatient medications: . ALPRAZolam  0.5 mg Oral BID  . aspirin EC  81 mg Oral Daily  . atorvastatin  40 mg Oral q1800  . calcitRIOL  0.75 mcg Oral Q M,W,F-HD  . calcium acetate  667 mg Oral TID WC  . [START ON 03/16/2017] darbepoetin (ARANESP) injection - DIALYSIS  100 mcg Intravenous Q Mon-HD  . famotidine  20 mg Oral Daily  . feeding supplement (NEPRO CARB STEADY)  237 mL Oral BID BM  . ferric gluconate (FERRLECIT/NULECIT) IV  125 mg Intravenous Q M,W,F-HD  . heparin subcutaneous  5,000 Units Subcutaneous Q8H  . metoprolol tartrate  25 mg Oral BID  . multivitamin  1 tablet Oral QHS  . nitroGLYCERIN  0.5 inch Topical Q6H  . penicillin v potassium  500 mg Oral Q8H  . sodium chloride flush  3 mL Intravenous Q12H  . venlafaxine XR  37.5 mg Oral QHS   . sodium chloride 10 mL/hr at 03/13/17 1608   acetaminophen **OR** acetaminophen, calcium acetate, fentaNYL (SUBLIMAZE) injection, HYDROcodone-acetaminophen, lip balm, menthol-cetylpyridinium **OR** phenol, nitroGLYCERIN, ondansetron **OR** ondansetron (ZOFRAN) IV, polyethylene glycol, tetrahydrozoline  Exam: Alert, no distress, Ox 3 No jvd Chest clear bilat RRR no mrg ABd soft ntnd Ext no LE edema L IJ cath  Dialysis: [facility] MWF  4h  59kg  2/2 bath Hep 5000  L IJ - M75 ug last 3/14 - last Hb 10, tsat 175 ferr 346 pth 1022      Assessment: 1. Fall/ R hip fx - sp ORIF 4/6 2. +trop/ acute septal MI - no CP, EKG w/ septal ant MI.  No CP / SOB today. Per cardiology 3. ESRD HD MWF 4. Volume - is  at dry wt, no chf 5. Anemia of CKD - cont fe/ esa 6. MBD - cont meds  Plan - HD this evening , then resume MWF sched   Vinson Moselle MD [facility] pager [phone]   03/14/2017, 3:42 PM    Recent Labs Lab 03/10/17 0132  03/12/17 0312 03/13/17 0243 03/14/17 0223  NA 138  < > 132* 135 134*  K 6.1*  < > 4.5 4.3 4.4  CL 111  < > 100* 96* 101  CO2 11*  < > 19* 24 20*  GLUCOSE 127*  < > 95 103* 92  BUN 72*  < > 46* 22* 33*  CREATININE 9.22*  < > 7.04* 4.54* 6.25*  CALCIUM 9.6  < > 8.6* 8.9 8.5*  PHOS 6.7*  --  6.4* 4.3  --   < > = values in this interval not displayed.  Recent Labs Lab 03/09/17 1407 03/10/17 0132 03/12/17 0312 03/13/17 0243  AST 21  --   --   --   ALT 15  --   --   --   ALKPHOS 67  --   --   --   BILITOT 0.7  --   --   --  PROT 7.9  --   --   --   ALBUMIN 3.8 3.7 3.0* 3.0*    Recent Labs Lab 03/09/17 1407  03/11/17 0437 03/12/17 0312 03/13/17 0243  WBC 10.2  < > 7.7 8.2 8.4  NEUTROABS 9.2*  --   --   --   --   HGB 11.4*  < > 10.4* 9.9* 10.7*  HCT 36.1  < > 32.3* 31.2* 33.3*  MCV 78.0  < > 75.1* 75.5* 75.3*  PLT 276  < > 233 252 262  < > = values in this interval not displayed. Iron/TIBC/Ferritin/ %Sat    Component Value Date/Time   IRON 74 10/23/2016 1405   TIBC 231 (L) 10/23/2016 1405   FERRITIN 159 10/23/2016 1405   IRONPCTSAT 32 (H) 10/23/2016 1405

## 2017-03-14 NOTE — Progress Notes (Signed)
Late Entry:  Called nursing to get update of patient after returning from surgery. Nursing reports patient is sleeping without distress.   Palma Holter, MD PGY 2 Family Medicine

## 2017-03-14 NOTE — Progress Notes (Signed)
Subjective: 1 Day Post-Op Procedure(s) (LRB): RIGHT TOTAL HIP ARTHROPLASTY (Right)  Patient reports pain as mild to moderate.  Reports that her pain is well controlled.  Tolerating POs well.  Admits to flatulence.  Denies fever, chills, N/V, CP, SOB.  Reports that she is pleased with her overall care.  Objective:   VITALS:  Temp:  [98.1 F (36.7 C)-98.4 F (36.9 C)] 98.4 F (36.9 C) (04/07 0844) Pulse Rate:  [61-101] 75 (04/07 0844) Resp:  [11-18] 18 (04/07 0844) BP: (116-154)/(46-71) 128/58 (04/07 0844) SpO2:  [93 %-100 %] 98 % (04/07 0844) FiO2 (%):  [0 %] 0 % (04/06 2024)  General: WDWN patient in NAD. Psych:  Appropriate mood and affect. Neuro:  A&O x 3, Moving all extremities, sensation intact to light touch HEENT:  EOMs intact Chest:  Even non-labored respirations Skin:  Dressing C/D/I, no rashes or lesions Extremities: warm/dry, mild edema, no erythema or echymosis.  No lymphadenopathy. Pulses: Popliteus 2+ MSK:  ROM: TKE, full ankle ROM, MMT: patient can perform quad set, (-) Homan's    LABS  Recent Labs  03/12/17 0312 03/13/17 0243  HGB 9.9* 10.7*  WBC 8.2 8.4  PLT 252 262    Recent Labs  03/13/17 0243 03/14/17 0223  NA 135 134*  K 4.3 4.4  CL 96* 101  CO2 24 20*  BUN 22* 33*  CREATININE 4.54* 6.25*  GLUCOSE 103* 92   No results for input(s): LABPT, INR in the last 72 hours.   Assessment/Plan: 1 Day Post-Op Procedure(s) (LRB): RIGHT TOTAL HIP ARTHROPLASTY (Right)  Patient seen in rounds on behalf of Dr. Linna Caprice. WBAT RLE with rolling walker.   Subcutaneus heparin for DVT prophylaxis starting this morning.  Heparin drip resumed after 24 hours. Up with therapy. Plan for 2 week outpatient post-op visit with Dr. Linna Caprice.   Alfredo Martinez, PA-C [facility] Office:  [phone]

## 2017-03-15 LAB — CBC
HEMATOCRIT: 26.6 % — AB (ref 36.0–46.0)
Hemoglobin: 8.3 g/dL — ABNORMAL LOW (ref 12.0–15.0)
MCH: 23.9 pg — ABNORMAL LOW (ref 26.0–34.0)
MCHC: 31.2 g/dL (ref 30.0–36.0)
MCV: 76.7 fL — ABNORMAL LOW (ref 78.0–100.0)
Platelets: 301 10*3/uL (ref 150–400)
RBC: 3.47 MIL/uL — ABNORMAL LOW (ref 3.87–5.11)
RDW: 16 % — AB (ref 11.5–15.5)
WBC: 8.3 10*3/uL (ref 4.0–10.5)

## 2017-03-15 LAB — BASIC METABOLIC PANEL
Anion gap: 15 (ref 5–15)
BUN: 50 mg/dL — ABNORMAL HIGH (ref 6–20)
CALCIUM: 8.7 mg/dL — AB (ref 8.9–10.3)
CO2: 20 mmol/L — AB (ref 22–32)
CREATININE: 7.39 mg/dL — AB (ref 0.44–1.00)
Chloride: 99 mmol/L — ABNORMAL LOW (ref 101–111)
GFR calc non Af Amer: 5 mL/min — ABNORMAL LOW (ref >60)
GFR, EST AFRICAN AMERICAN: 5 mL/min — AB (ref >60)
Glucose, Bld: 102 mg/dL — ABNORMAL HIGH (ref 65–99)
Potassium: 4.1 mmol/L (ref 3.5–5.1)
Sodium: 134 mmol/L — ABNORMAL LOW (ref 135–145)

## 2017-03-15 LAB — HEPARIN LEVEL (UNFRACTIONATED)
Heparin Unfractionated: 0.82 IU/mL — ABNORMAL HIGH (ref 0.30–0.70)
Heparin Unfractionated: 0.52 IU/mL (ref 0.30–0.70)

## 2017-03-15 MED ORDER — SODIUM CHLORIDE 0.9 % IV SOLN: 100.0000 mL | INTRAVENOUS | Status: DC | PRN

## 2017-03-15 MED ORDER — LIDOCAINE HCL (PF) 1 % IJ SOLN: 5.0000 mL | INTRAMUSCULAR | Status: DC | PRN

## 2017-03-15 MED ORDER — LIDOCAINE-PRILOCAINE 2.5-2.5 % EX CREA: 1.0000 "application " | TOPICAL_CREAM | CUTANEOUS | Status: DC | PRN

## 2017-03-15 MED ORDER — ALTEPLASE 2 MG IJ SOLR
2.0000 mg | Freq: Once | INTRAMUSCULAR | Status: DC | PRN
Start: 2017-03-15 — End: 2017-03-15

## 2017-03-15 MED ORDER — CALCITRIOL 0.25 MCG PO CAPS
ORAL_CAPSULE | ORAL | Status: AC
  Administered 2017-03-15: 0.75 ug
  Filled 2017-03-15: qty 3

## 2017-03-15 MED ORDER — HEPARIN SODIUM (PORCINE) 1000 UNIT/ML DIALYSIS: 1000.0000 [IU] | INTRAMUSCULAR | Status: DC | PRN

## 2017-03-15 MED ORDER — HEPARIN SODIUM (PORCINE) 1000 UNIT/ML DIALYSIS: 2000.0000 [IU] | INTRAMUSCULAR | Status: DC | PRN

## 2017-03-15 MED ORDER — HYDROCODONE-ACETAMINOPHEN 5-325 MG PO TABS
ORAL_TABLET | ORAL | Status: AC
  Administered 2017-03-15: 1 via ORAL
  Filled 2017-03-15: qty 1

## 2017-03-15 MED ORDER — HEPARIN (PORCINE) IN NACL 100-0.45 UNIT/ML-% IJ SOLN
850.0000 [IU]/h | INTRAMUSCULAR | Status: DC
  Administered 2017-03-15: 950 [IU]/h via INTRAVENOUS
  Administered 2017-03-16: 850 [IU]/h via INTRAVENOUS
  Filled 2017-03-15 (×2): qty 250

## 2017-03-15 MED ORDER — BARRIER CREAM NON-SPECIFIED
1.0000 "application " | TOPICAL_CREAM | Freq: Two times a day (BID) | TOPICAL | Status: DC | PRN
  Filled 2017-03-15 (×3): qty 1

## 2017-03-15 MED ORDER — PENTAFLUOROPROP-TETRAFLUOROETH EX AERO: 1.0000 "application " | INHALATION_SPRAY | CUTANEOUS | Status: DC | PRN

## 2017-03-15 MED ORDER — ACETAMINOPHEN 325 MG PO TABS
ORAL_TABLET | ORAL | Status: AC
  Filled 2017-03-15: qty 2

## 2017-03-15 NOTE — Progress Notes (Signed)
ANTICOAGULATION CONSULT NOTE - Follow Up Consult  Pharmacy Consult for heparin Indication: NSTEMI  Allergies  Allergen Reactions  . Aspirin Other (See Comments)    "makes my stomach hurt"  . Codeine Other (See Comments)    "i get irritated"    Patient Measurements: Height:  (154.9 cm) Weight: 131 lb 13.4 oz (59.8 kg) IBW/kg (Calculated) : 47.8 Heparin Dosing Weight:    Vital Signs: Temp: 99.2 F (37.3 C) (04/08 2005) Temp Source: Oral (04/08 2005) BP: 128/78 (04/08 2005) Pulse Rate: 84 (04/08 2005)  Labs:  Recent Labs  03/13/17 0243 03/14/17 0223 03/15/17 0524 03/15/17 1506 03/15/17 2159  HGB 10.7*  --  8.3*  --   --   HCT 33.3*  --  26.6*  --   --   PLT 262  --  301  --   --   HEPARINUNFRC <0.10*  --   --  0.82* 0.52  CREATININE 4.54* 6.25* 7.39*  --   --     Estimated Creatinine Clearance: 4.9 mL/min (A) (by C-G formula based on SCr of 7.39 mg/dL (H)).   Assessment:  Anticoag: PAF not on anticoag PTA due to hx of GIB. Heparin for NSTEMI, resumed 4/8 post-OR. Heparin level now therapeutic (0.52) on gtt at 850 units/hr. Hgb down to 8.3. No overt bleeding noted.  Goal of Therapy:  Heparin level 0.3-0.7 units/ml Monitor platelets by anticoagulation protocol: Yes   Plan:  Continue heparin at 850 units/hr. Daily HL, CBC  Christoper Fabian, PharmD, BCPS Clinical pharmacist, pager [phone] 03/15/2017,11:14 PM

## 2017-03-15 NOTE — Progress Notes (Signed)
Patient arrived to unit by bed.  Reviewed treatment plan and this RN agrees with plan.  Report received from bedside RN, Mitzi Davenport.  Consent verified.  Patient A & O to self, situation.   Lung sounds clear to ausculation in all fields. Generalized non pitting edema. Cardiac:  BBB.  Removed caps and cleansed LIJ catheter with chlorhedxidine.  Aspirated ports of heparin and flushed them with saline per protocol.  Connected and secured lines, initiated treatment at 0848.  UF Goal of 1500 mL and net fluid removal 1 L.  Will continue to monitor.

## 2017-03-15 NOTE — Progress Notes (Signed)
Dialysis treatment completed.  1000 mL ultrafiltrated.  500 mL net fluid removal.  Patient status unchanged. Lung sounds diminished to ausculation in all fields. Generalized edema. Cardiac: BBB.  Cleansed LIJ catheter with chlorhexidine.  Disconnected lines and flushed ports with saline per protocol.  Ports locked with heparin and capped per protocol.    Report given to bedside, RN Mitzi Davenport.

## 2017-03-15 NOTE — NC FL2 (Signed)
[place] MEDICAID FL2 LEVEL OF CARE SCREENING TOOL     IDENTIFICATION  Patient Name: Monica [name] Birthdate: 22-Sep-1932 Sex: female Admission Date (Current Location): 03/09/2017  [facility] and [place] Number:  Producer, television/film/video and Address:  [facility] [place], [address] [place], [place] [address]      Provider Number: [id number]  Attending Physician Name and Address:  Leighton Roach McDiarmid, MD  Relative Name and Phone Number:       Current Level of Care: Hospital Recommended Level of Care: Skilled Nursing Facility Prior Approval Number:    Date Approved/Denied:   PASRR Number: [id number]  Discharge Plan: SNF    Current Diagnoses: Patient Active Problem List   Diagnosis Date Noted  . Acute blood loss anemia 03/13/2017  . Anemia of renal disease 03/13/2017  . Malnutrition of moderate degree 03/11/2017  . Femoral neck fracture (HCC) 03/10/2017  . Displaced fracture of right femoral neck (HCC)   . ST elevation myocardial infarction (STEMI) (HCC)   . Closed displaced fracture of left femoral neck (HCC)   . Acute delirium   . Hip fracture (HCC) 03/09/2017  . Steal syndrome as complication of dialysis access (HCC) 01/31/2017  . ESRD on dialysis (HCC) 01/29/2017  . Left sided chest pain 10/28/2016  . Protein-calorie malnutrition, severe 10/25/2016  . Uremia 10/23/2016  . Dementia 10/23/2016  . Anemia, chronic renal failure, stage 5 (HCC) 10/23/2016  . Unintentional weight loss 10/23/2016  . Pancreatic cyst   . Hematuria   . Encounter for nasogastric (NG) tube placement   . SBO (small bowel obstruction)   . Abscess of buttock 12/29/2013  . HTN (hypertension) 12/29/2013  . PAF (paroxysmal atrial fibrillation) (HCC) 12/29/2013  . Melena 12/29/2013  . CKD (chronic kidney disease) stage 5, GFR less than 15 ml/min (HCC) 12/29/2013  . Escherichia coli urinary tract infection 12/29/2013  . Gastric ulcer with hemorrhage 12/29/2013  . Upper GI bleed  12/28/2013    Orientation RESPIRATION BLADDER Height & Weight     Self, Situation  Normal Continent Weight: 130 lb 11.7 oz (59.3 kg) Height:   (154.9 cm)  BEHAVIORAL SYMPTOMS/MOOD NEUROLOGICAL BOWEL NUTRITION STATUS      Continent Diet (see DC summary)  AMBULATORY STATUS COMMUNICATION OF NEEDS Skin   Extensive Assist Verbally Surgical wounds                       Personal Care Assistance Level of Assistance  Bathing, Dressing Bathing Assistance: Maximum assistance   Dressing Assistance: Maximum assistance     Functional Limitations Info             SPECIAL CARE FACTORS FREQUENCY  PT (By licensed PT), OT (By licensed OT)     PT Frequency: 5/wk OT Frequency: 5/wk            Contractures      Additional Factors Info  Code Status, Allergies, Psychotropic Code Status Info: FULL Allergies Info: Aspirin, Codeine Psychotropic Info: xanax         Current Medications (03/15/2017):  This is the current hospital active medication list Current Facility-Administered Medications  Medication Dose Route Frequency Provider Last Rate Last Dose  . 0.9 %  sodium chloride infusion   Intravenous Continuous Arma Heading, MD   Stopped at 03/14/17 1312  . acetaminophen (TYLENOL) tablet 650 mg  650 mg Oral Q6H PRN Beaulah Dinning, MD   650 mg at 03/13/17 5621   Or  .  acetaminophen (TYLENOL) suppository 650 mg  650 mg Rectal Q6H PRN Beaulah Dinning, MD      . ALPRAZolam Prudy Feeler) tablet 0.5 mg  0.5 mg Oral BID Beaulah Dinning, MD   0.5 mg at 03/15/17 0816  . aspirin EC tablet 81 mg  81 mg Oral Daily Rinaldo Cloud, MD   81 mg at 03/15/17 0816  . atorvastatin (LIPITOR) tablet 40 mg  40 mg Oral q1800 Howard Pouch, MD   40 mg at 03/14/17 1700  . barrier cream (non-specified) 1 application  1 application Topical BID PRN Uvaldo Rising, MD      . calcitRIOL (ROCALTROL) capsule 0.75 mcg  0.75 mcg Oral Q M,W,F-HD Weston Settle, PA-C      . calcium acetate (PHOSLO) capsule  1,334 mg  1,334 mg Oral PRN Leighton Roach McDiarmid, MD      . calcium acetate (PHOSLO) capsule 667 mg  667 mg Oral TID WC Beaulah Dinning, MD   667 mg at 03/15/17 0815  . [START ON 03/16/2017] Darbepoetin Alfa (ARANESP) injection 100 mcg  100 mcg Intravenous Q Mon-HD Weston Settle, PA-C      . famotidine (PEPCID) tablet 20 mg  20 mg Oral Daily Howard Pouch, MD   20 mg at 03/15/17 0815  . feeding supplement (NEPRO CARB STEADY) liquid 237 mL  237 mL Oral BID BM Leighton Roach McDiarmid, MD   237 mL at 03/14/17 1400  . ferric gluconate (NULECIT) 125 mg in sodium chloride 0.9 % 100 mL IVPB  125 mg Intravenous Q M,W,F-HD Weston Settle, PA-C   125 mg at 03/15/17 1019  . heparin ADULT infusion 100 units/mL (25000 units/273mL sodium chloride 0.45%)  950 Units/hr Intravenous Continuous Juliette Mangle, RPH 9.5 mL/hr at 03/15/17 0816 950 Units/hr at 03/15/17 0816  . HYDROcodone-acetaminophen (NORCO/VICODIN) 5-325 MG per tablet 1 tablet  1 tablet Oral Q6H PRN Erasmo Downer, MD   1 tablet at 03/15/17 0548  . HYDROcodone-acetaminophen (NORCO/VICODIN) 5-325 MG per tablet           . lip balm (BLISTEX) ointment   Topical PRN Beaulah Dinning, MD   1 application at 03/14/17 1200  . menthol-cetylpyridinium (CEPACOL) lozenge 3 mg  1 lozenge Oral PRN Samson Frederic, MD       Or  . phenol (CHLORASEPTIC) mouth spray 1 spray  1 spray Mouth/Throat PRN Samson Frederic, MD      . metoprolol tartrate (LOPRESSOR) tablet 25 mg  25 mg Oral BID Rinaldo Cloud, MD   25 mg at 03/14/17 2307  . multivitamin (RENA-VIT) tablet 1 tablet  1 tablet Oral QHS Weston Settle, PA-C   1 tablet at 03/14/17 2306  . nitroGLYCERIN (NITROGLYN) 2 % ointment 0.5 inch  0.5 inch Topical Q6H Rinaldo Cloud, MD   0.5 inch at 03/15/17 0548  . nitroGLYCERIN (NITROSTAT) SL tablet 0.4 mg  0.4 mg Sublingual Q5 min PRN Beaulah Dinning, MD      . ondansetron [facility]) tablet 4 mg  4 mg Oral Q6H PRN Samson Frederic, MD       Or  . ondansetron [facility]) injection 4  mg  4 mg Intravenous Q6H PRN Samson Frederic, MD   4 mg at 03/14/17 1311  . penicillin v potassium (VEETID) tablet 500 mg  500 mg Oral Q8H Beaulah Dinning, MD   500 mg at 03/15/17 0548  . polyethylene glycol (MIRALAX / GLYCOLAX) packet 17 g  17 g Oral Daily PRN Beaulah Dinning, MD      .  sodium chloride flush (NS) 0.9 % injection 3 mL  3 mL Intravenous Q12H Beaulah Dinning, MD   3 mL at 03/15/17 0823  . tetrahydrozoline 0.05 % ophthalmic solution 1 drop  1 drop Both Eyes Daily PRN Beaulah Dinning, MD      . venlafaxine XR (EFFEXOR-XR) 24 hr capsule 37.5 mg  37.5 mg Oral QHS Beaulah Dinning, MD   37.5 mg at 03/14/17 2308     Discharge Medications: Please see discharge summary for a list of discharge medications.  Relevant Imaging Results:  Relevant Lab Results:   Additional Information SSN: [id number], gets dialysis MWF at [facility] [place] [facility], LCSW

## 2017-03-15 NOTE — Progress Notes (Signed)
ANTICOAGULATION CONSULT NOTE - Follow Up Consult  Pharmacy Consult for heparin Indication: NSTEMI  Allergies  Allergen Reactions  . Aspirin Other (See Comments)    "makes my stomach hurt"  . Codeine Other (See Comments)    "i get irritated"    Patient Measurements: Height:  (154.9 cm) Weight: 131 lb 13.4 oz (59.8 kg) IBW/kg (Calculated) : 47.8 Heparin Dosing Weight:    Vital Signs: Temp: 98.6 F (37 C) (04/08 1420) Temp Source: Oral (04/08 1420) BP: 125/53 (04/08 1420) Pulse Rate: 99 (04/08 1420)  Labs:  Recent Labs  03/13/17 0243 03/14/17 0223 03/15/17 0524 03/15/17 1506  HGB 10.7*  --  8.3*  --   HCT 33.3*  --  26.6*  --   PLT 262  --  301  --   HEPARINUNFRC <0.10*  --   --  0.82*  CREATININE 4.54* 6.25* 7.39*  --     Estimated Creatinine Clearance: 4.9 mL/min (A) (by C-G formula based on SCr of 7.39 mg/dL (H)).   Assessment:  Anticoag: PAF not on anticoag due to hx of GIB. Heparin for NSTEMI, held x 24 hr for OR, now resumed 4/8 on previously therapeutic rate 950/hr Hgb down 8.3 s/p OR POD #2  Heparin level 0.82 ( 7hr level) elevated  Goal of Therapy:  Heparin level 0.3-0.7 units/ml Monitor platelets by anticoagulation protocol: Yes   Plan:  4/8 heparin resumed for NSTEMI Decrease to 850 units/hr. Recheck in 6 hr. Daily HL, CBC   Kairah Leoni S. Merilynn Finland, PharmD, BCPS Clinical Staff Pharmacist Pager [phone]  Misty Stanley Stillinger 03/15/2017,4:01 PM

## 2017-03-15 NOTE — Progress Notes (Signed)
Ref: No PCP Per Patient   Subjective:  Undergoing hemodialysis. No chest pain and hip pain is improving.  Objective:  Vital Signs in the last 24 hours: Temp:  [98 F (36.7 C)-99 F (37.2 C)] 98.1 F (36.7 C) (04/08 0839) Pulse Rate:  [78-99] 92 (04/08 1100) Cardiac Rhythm: Normal sinus rhythm;Bundle branch block (04/08 0839) Resp:  [16-20] 16 (04/08 0839) BP: (95-137)/(51-67) 117/64 (04/08 1100) SpO2:  [94 %-99 %] 99 % (04/08 0446) Weight:  [59.3 kg (130 lb 11.7 oz)] 59.3 kg (130 lb 11.7 oz) (04/08 0839)  Physical Exam: BP Readings from Last 1 Encounters:  03/15/17 117/64    Wt Readings from Last 1 Encounters:  03/15/17 59.3 kg (130 lb 11.7 oz)    Weight change:  Body mass index is 24.7 kg/m. HEENT: [place]/AT, Eyes-[place], PERL, EOMI, Conjunctiva-Pale pink, Sclera-Non-icteric Neck: No JVD, No bruit, Trachea midline. Lungs:  Clear, Bilateral. Cardiac:  Regular rhythm, normal S1 and S2, no S3. II/VI systolic murmur. Abdomen:  Soft, non-tender. BS present. Extremities:  No edema present. No cyanosis. No clubbing. Right hip has surgical scar. Left IJ vascular access/ CNS: AxOx3, Cranial nerves grossly intact, moves all 4 extremities.  Skin: Warm and dry.   Intake/Output from previous day: 04/07 0701 - 04/08 0700 In: 1170.7 [P.O.:960; I.V.:210.7] Out: 220 [Urine:220]    Lab Results: BMET    Component Value Date/Time   NA 134 (L) 03/15/2017 0524   NA 134 (L) 03/14/2017 0223   NA 135 03/13/2017 0243   K 4.1 03/15/2017 0524   K 4.4 03/14/2017 0223   K 4.3 03/13/2017 0243   CL 99 (L) 03/15/2017 0524   CL 101 03/14/2017 0223   CL 96 (L) 03/13/2017 0243   CO2 20 (L) 03/15/2017 0524   CO2 20 (L) 03/14/2017 0223   CO2 24 03/13/2017 0243   GLUCOSE 102 (H) 03/15/2017 0524   GLUCOSE 92 03/14/2017 0223   GLUCOSE 103 (H) 03/13/2017 0243   BUN 50 (H) 03/15/2017 0524   BUN 33 (H) 03/14/2017 0223   BUN 22 (H) 03/13/2017 0243   CREATININE 7.39 (H) 03/15/2017 0524   CREATININE  6.25 (H) 03/14/2017 0223   CREATININE 4.54 (H) 03/13/2017 0243   CALCIUM 8.7 (L) 03/15/2017 0524   CALCIUM 8.5 (L) 03/14/2017 0223   CALCIUM 8.9 03/13/2017 0243   GFRNONAA 5 (L) 03/15/2017 0524   GFRNONAA 6 (L) 03/14/2017 0223   GFRNONAA 8 (L) 03/13/2017 0243   GFRAA 5 (L) 03/15/2017 0524   GFRAA 6 (L) 03/14/2017 0223   GFRAA 9 (L) 03/13/2017 0243   CBC    Component Value Date/Time   WBC 8.3 03/15/2017 0524   RBC 3.47 (L) 03/15/2017 0524   HGB 8.3 (L) 03/15/2017 0524   HCT 26.6 (L) 03/15/2017 0524   PLT 301 03/15/2017 0524   MCV 76.7 (L) 03/15/2017 0524   MCH 23.9 (L) 03/15/2017 0524   MCHC 31.2 03/15/2017 0524   RDW 16.0 (H) 03/15/2017 0524   LYMPHSABS 0.5 (L) 03/09/2017 1407   MONOABS 0.5 03/09/2017 1407   EOSABS 0.0 03/09/2017 1407   BASOSABS 0.0 03/09/2017 1407   HEPATIC Function Panel  Recent Labs  08/13/16 1459 01/31/17 1752 03/09/17 1407  PROT 8.7* 6.9 7.9   HEMOGLOBIN A1C No components found for: HGA1C,  MPG CARDIAC ENZYMES Lab Results  Component Value Date   CKTOTAL 170 03/09/2017   TROPONINI 1.31 (HH) 03/10/2017   TROPONINI 1.33 (HH) 03/10/2017   TROPONINI 1.01 (HH) 03/09/2017   BNP  No results for input(s): PROBNP in the last 8760 hours. TSH  Recent Labs  10/23/16 1405 03/10/17 0132  TSH 1.356 4.200   CHOLESTEROL No results for input(s): CHOL in the last 8760 hours.  Scheduled Meds: . ALPRAZolam  0.5 mg Oral BID  . aspirin EC  81 mg Oral Daily  . atorvastatin  40 mg Oral q1800  . calcitRIOL  0.75 mcg Oral Q M,W,F-HD  . calcium acetate  667 mg Oral TID WC  . [START ON 03/16/2017] darbepoetin (ARANESP) injection - DIALYSIS  100 mcg Intravenous Q Mon-HD  . famotidine  20 mg Oral Daily  . feeding supplement (NEPRO CARB STEADY)  237 mL Oral BID BM  . ferric gluconate (FERRLECIT/NULECIT) IV  125 mg Intravenous Q M,W,F-HD  . metoprolol tartrate  25 mg Oral BID  . multivitamin  1 tablet Oral QHS  . nitroGLYCERIN  0.5 inch Topical Q6H  .  penicillin v potassium  500 mg Oral Q8H  . sodium chloride flush  3 mL Intravenous Q12H  . venlafaxine XR  37.5 mg Oral QHS   Continuous Infusions: . sodium chloride Stopped (03/14/17 1312)  . heparin 950 Units/hr (03/15/17 0816)   PRN Meds:.sodium chloride, sodium chloride, acetaminophen **OR** acetaminophen, alteplase, barrier cream, calcium acetate, heparin, [START ON 03/16/2017] heparin, HYDROcodone-acetaminophen, lidocaine (PF), lidocaine-prilocaine, lip balm, menthol-cetylpyridinium **OR** phenol, nitroGLYCERIN, ondansetron **OR** ondansetron (ZOFRAN) IV, pentafluoroprop-tetrafluoroeth, polyethylene glycol, tetrahydrozoline  Assessment/Plan: Right femoral neck fracture S/P right hip hemiarthroplasty Recent septal wall MI Hypertension ESRD with hemodialysis Paroxysmal atrial flutter GERD Anemia of chronic disease Dementia  Continue medical treatment.   LOS: 6 days    Orpah Cobb  MD  03/15/2017, 11:51 AM

## 2017-03-15 NOTE — Progress Notes (Signed)
ANTICOAGULATION CONSULT NOTE - Initial Consult  Pharmacy Consult for heparin Indication: NSTEMI  Allergies  Allergen Reactions  . Aspirin Other (See Comments)    "makes my stomach hurt"  . Codeine Other (See Comments)    "i get irritated"    Patient Measurements: Height:  (154.9 cm) Weight: 130 lb 11.7 oz (59.3 kg) IBW/kg (Calculated) : 47.8  Vital Signs: Temp: 98 F (36.7 C) (04/08 0446) Temp Source: Oral (04/08 0446) BP: 113/51 (04/08 0446) Pulse Rate: 83 (04/08 0446)  Labs:  Recent Labs  03/12/17 1202 03/13/17 0243 03/14/17 0223 03/15/17 0524  HGB  --  10.7*  --  8.3*  HCT  --  33.3*  --  26.6*  PLT  --  262  --  301  HEPARINUNFRC 0.34 <0.10*  --   --   CREATININE  --  4.54* 6.25* 7.39*    Estimated Creatinine Clearance: 4.9 mL/min (A) (by C-G formula based on SCr of 7.39 mg/dL (H)).   Medical History: Past Medical History:  Diagnosis Date  . Anemia of renal disease 03/13/2017  . Anxiety   . Arthritis    "all over" (01/29/2017)  . Atrial flutter (HCC)   . Chronic back pain    "all my back" (01/29/2017)  . Constipation   . Dementia   . Depression   . ESRD (end stage renal disease) on dialysis [facility])    "MWF; [facility]" (01/29/2017)  . ETOHism (HCC)    Quit in the 1980s.  . Gastric ulcer 12/2013.   Prepyloric ulcer. On follow-up EGD in 02/2014 this was 90% healed  . GERD (gastroesophageal reflux disease)   . Headache    as a young woman  . Hemorrhoids   . History of kidney stones   . History of stomach ulcers   . Hypertension   . Pancreatic cyst 09/2010.   At uncinate process.  Serial CT imaging favors benign process    Medications:  Prescriptions Prior to Admission  Medication Sig Dispense Refill Last Dose  . acetaminophen (TYLENOL) 500 MG tablet Take 1,000 mg by mouth every 6 (six) hours as needed for mild pain.   unknown  . ALPRAZolam (XANAX) 0.5 MG tablet Take 1 tablet (0.5 mg total) by mouth 2 (two) times daily. 60 tablet 0 unknown   . calcium acetate (PHOSLO) 667 MG tablet Take 667 mg by mouth See admin instructions. Take 2 tablets (1334 mg) by mouth 3 times daily with meals and with snacks   unknown  . metoprolol tartrate (LOPRESSOR) 25 MG tablet Take 12.5 mg by mouth 2 (two) times daily.    unknown  . nitroGLYCERIN (NITROSTAT) 0.4 MG SL tablet Place 0.4 mg under the tongue every 5 (five) minutes as needed for chest pain.   unknown  . pantoprazole (PROTONIX) 40 MG tablet Take 1 tablet (40 mg total) by mouth daily. 30 tablet 3 unknown  . Tetrahydrozoline HCl (VISINE OP) Place 2 drops into both eyes daily as needed (dry eyes).   unknown  . venlafaxine XR (EFFEXOR-XR) 37.5 MG 24 hr capsule Take 37.5 mg by mouth at bedtime.  0 unknown  . HYDROcodone-acetaminophen (NORCO/VICODIN) 5-325 MG tablet Take 1 tablet by mouth every 12 (twelve) hours as needed for moderate pain. DNE 3gm of APAP/24hrs (Patient not taking: Reported on 03/03/2017) 30 tablet 0 Not Taking at Unknown time   Scheduled:  . ALPRAZolam  0.5 mg Oral BID  . aspirin EC  81 mg Oral Daily  . atorvastatin  40  mg Oral q1800  . calcitRIOL  0.75 mcg Oral Q M,W,F-HD  . calcium acetate  667 mg Oral TID WC  . [START ON 03/16/2017] darbepoetin (ARANESP) injection - DIALYSIS  100 mcg Intravenous Q Mon-HD  . famotidine  20 mg Oral Daily  . feeding supplement (NEPRO CARB STEADY)  237 mL Oral BID BM  . ferric gluconate (FERRLECIT/NULECIT) IV  125 mg Intravenous Q M,W,F-HD  . heparin subcutaneous  5,000 Units Subcutaneous Q8H  . metoprolol tartrate  25 mg Oral BID  . multivitamin  1 tablet Oral QHS  . nitroGLYCERIN  0.5 inch Topical Q6H  . penicillin v potassium  500 mg Oral Q8H  . sodium chloride flush  3 mL Intravenous Q12H  . venlafaxine XR  37.5 mg Oral QHS   Infusions:  . sodium chloride Stopped (03/14/17 1312)    Assessment: 83yo female had been on heparin for NSTEMI, held x24hr for surgery and was on SQ heparin, now to resume gtt.  Goal of Therapy:  Heparin  level 0.3-0.7 units/ml Monitor platelets by anticoagulation protocol: Yes   Plan:  Will resume heparin gtt at 950 units/hr (previously therapeutic rate) and monitor heparin levels and CBC.  Vernard Gambles, PharmD, BCPS  03/15/2017,7:07 AM

## 2017-03-15 NOTE — Progress Notes (Signed)
Verbal order received from  Algie Coffer, MD to dc nitroglycerin  Paste, will continue to monitor.

## 2017-03-15 NOTE — Progress Notes (Signed)
Subjective: 2 Days Post-Op Procedure(s) (LRB): RIGHT TOTAL HIP ARTHROPLASTY (Right) Patient reports pain as mild.  Reports hip pain well controlled right now. No c/o. In HD this AM.  Objective: Vital signs in last 24 hours: Temp:  [98 F (36.7 C)-99 F (37.2 C)] 98.1 F (36.7 C) (04/08 0839) Pulse Rate:  [78-97] 78 (04/08 0900) Resp:  [16-20] 16 (04/08 0839) BP: (107-137)/(51-67) 107/61 (04/08 0900) SpO2:  [94 %-99 %] 99 % (04/08 0446) Weight:  [59.3 kg (130 lb 11.7 oz)] 59.3 kg (130 lb 11.7 oz) (04/08 0839)  Intake/Output from previous day: 04/07 0701 - 04/08 0700 In: 1170.7 [P.O.:960; I.V.:210.7] Out: 220 [Urine:220] Intake/Output this shift: Total I/O In: 240 [P.O.:240] Out: -    Recent Labs  03/13/17 0243 03/15/17 0524  HGB 10.7* 8.3*    Recent Labs  03/13/17 0243 03/15/17 0524  WBC 8.4 8.3  RBC 4.42 3.47*  HCT 33.3* 26.6*  PLT 262 301    Recent Labs  03/14/17 0223 03/15/17 0524  NA 134* 134*  K 4.4 4.1  CL 101 99*  CO2 20* 20*  BUN 33* 50*  CREATININE 6.25* 7.39*  GLUCOSE 92 102*  CALCIUM 8.5* 8.7*   No results for input(s): LABPT, INR in the last 72 hours.  Neurologically intact ABD soft Neurovascular intact Sensation intact distally Intact pulses distally Dorsiflexion/Plantar flexion intact Incision: dressing C/D/I and no drainage No cellulitis present Compartment soft no sign of DVT  Assessment/Plan: 2 Days Post-Op Procedure(s) (LRB): RIGHT TOTAL HIP ARTHROPLASTY (Right) Advance diet Up with therapy D/C IV fluids  Dialysis today WBAT RLE with rolling walker Continue heparin for DVT ppx Watch H&H, Hgb 8.3 this AM  BISSELL, JACLYN M. 03/15/2017, 9:20 AM

## 2017-03-15 NOTE — Progress Notes (Signed)
[facility] Progress Note   Subjective: no new c/o's.   Vitals:   03/15/17 1200 03/15/17 1233 03/15/17 1236 03/15/17 1420  BP: 111/70 (!) 109/59 124/71 (!) 125/53  Pulse: 90 67 86 99  Resp:  19  16  Temp:  97.8 F (36.6 C)  98.6 F (37 C)  TempSrc:    Oral  SpO2:    100%  Weight:  59.8 kg (131 lb 13.4 oz)    Height:        Inpatient medications: . ALPRAZolam  0.5 mg Oral BID  . aspirin EC  81 mg Oral Daily  . atorvastatin  40 mg Oral q1800  . calcitRIOL  0.75 mcg Oral Q M,W,F-HD  . calcium acetate  667 mg Oral TID WC  . [START ON 03/16/2017] darbepoetin (ARANESP) injection - DIALYSIS  100 mcg Intravenous Q Mon-HD  . famotidine  20 mg Oral Daily  . feeding supplement (NEPRO CARB STEADY)  237 mL Oral BID BM  . ferric gluconate (FERRLECIT/NULECIT) IV  125 mg Intravenous Q M,W,F-HD  . metoprolol tartrate  25 mg Oral BID  . multivitamin  1 tablet Oral QHS  . penicillin v potassium  500 mg Oral Q8H  . sodium chloride flush  3 mL Intravenous Q12H  . venlafaxine XR  37.5 mg Oral QHS   . sodium chloride Stopped (03/14/17 1312)  . heparin 850 Units/hr (03/15/17 1619)   acetaminophen **OR** acetaminophen, barrier cream, calcium acetate, HYDROcodone-acetaminophen, lip balm, menthol-cetylpyridinium **OR** phenol, nitroGLYCERIN, ondansetron **OR** ondansetron (ZOFRAN) IV, polyethylene glycol, tetrahydrozoline  Exam: Alert, no distress, Ox 3 No jvd Chest clear bilat RRR no mrg ABd soft ntnd Ext no LE edema L IJ cath  Dialysis: [facility] MWF  4h  59kg  2/2 bath Hep 5000  L IJ - M75 ug last 3/14 - last Hb 10, tsat 175 ferr 346 pth 1022      Assessment: 1. Fall/ R hip fx - sp ORIF 4/6 2. +trop/ acute septal MI - no CP, EKG w/ septal ant MI.  No CP / SOB now. Per cardiology 3. ESRD HD MWF. Had HD today due to back up from yesterday.  4. Volume - is at dry wt, no chf 5. Anemia of CKD - cont fe/ esa 6. MBD - cont meds 7. AMS - resolved, hx mild dementia  Plan  - HD Monday, min UF   Vinson Moselle MD [facility] pager [phone]   03/15/2017, 7:53 PM    Recent Labs Lab 03/10/17 0132  03/12/17 0312 03/13/17 0243 03/14/17 0223 03/15/17 0524  NA 138  < > 132* 135 134* 134*  K 6.1*  < > 4.5 4.3 4.4 4.1  CL 111  < > 100* 96* 101 99*  CO2 11*  < > 19* 24 20* 20*  GLUCOSE 127*  < > 95 103* 92 102*  BUN 72*  < > 46* 22* 33* 50*  CREATININE 9.22*  < > 7.04* 4.54* 6.25* 7.39*  CALCIUM 9.6  < > 8.6* 8.9 8.5* 8.7*  PHOS 6.7*  --  6.4* 4.3  --   --   < > = values in this interval not displayed.  Recent Labs Lab 03/09/17 1407 03/10/17 0132 03/12/17 0312 03/13/17 0243  AST 21  --   --   --   ALT 15  --   --   --   ALKPHOS 67  --   --   --   BILITOT 0.7  --   --   --  PROT 7.9  --   --   --   ALBUMIN 3.8 3.7 3.0* 3.0*    Recent Labs Lab 03/09/17 1407  03/12/17 0312 03/13/17 0243 03/15/17 0524  WBC 10.2  < > 8.2 8.4 8.3  NEUTROABS 9.2*  --   --   --   --   HGB 11.4*  < > 9.9* 10.7* 8.3*  HCT 36.1  < > 31.2* 33.3* 26.6*  MCV 78.0  < > 75.5* 75.3* 76.7*  PLT 276  < > 252 262 301  < > = values in this interval not displayed. Iron/TIBC/Ferritin/ %Sat    Component Value Date/Time   IRON 74 10/23/2016 1405   TIBC 231 (L) 10/23/2016 1405   FERRITIN 159 10/23/2016 1405   IRONPCTSAT 32 (H) 10/23/2016 1405

## 2017-03-15 NOTE — Progress Notes (Signed)
Family Medicine Teaching Service Daily Progress Note Intern Pager: [phone]  Patient name: Monica [name] Medical record number: [id number] Date of birth: 27-Dec-1931 Age: 83 y.o. Gender: female  Primary Care Provider: No PCP Per Patient Consultants: Cardiology, Ortho Code Status: Full  Chief Complaint: AMS and right hip pain  Assessment and Plan: Monica [name] is a 83 y.o. female presenting with AMS and right hip pain . PMH is significant for HTN, ESRD with HD MWF, Anxiety, Dementia  Recent STEMI: EKG on admit with Q waves in V1 to V3 and ST elevation in anterior leads, most recent EKG showing evolving septal ant MI. Troponins elevated to peak 1.31. -Telemetry -ASA 81 -Per surgery note, discussed with Dr. Donnie Aho, on subcutaneous heparin for 24 hours. Resume heparin drip with no bolus this am.  - Cardiology following. Appreciate recommendations -Lopressor 25 mg BID, Atorvastatin 40 mg   Right Femoral Neck Fracture s/p R hip hemiarthroplasty 03/13/17: Likely from mechanical fall, unwitnessed. Now POD #2 with good pain control. - Per Orthopedics, WBAT RLE with rolling walker and up with PT/OT. Plan for 2 week outpatient pot-op visit with Dr. Linna Caprice. Appreciate recommendations -Tylenol 650 mg q6h prn mild pain and Norco 5-235mg  q6h prn for breakthrough  - vitamin D low (13.8), continue calcitriol supplementation. - nutrition consult - avoid PPI, continue H2 blocker instead  - PT/OT recommending SNF - SW consult for SNF placement  ESRD: HD on MWF - Continue home phoslo - Nephrology following, HD next 4/9 - Daily renal function panel  Anemia  Baseline ~11. Last iron panel 10/2016 that showed adequate iron, folate, B12, ferritin. Multifactorial from ESRD and R hip fracture. Hgb stable previously 11.4>10.7>10.4>9.9>10.7. This am down to 8.3 likely due to normal operative losses.  - monitor CBC  Depression and Anxiety:  - Continue home Effexor and Xanax  Toothache Per speech  evaluation, edema R upper maxilla with last molar sensitive to touch. - penicillin V - outpatient follow up with dentist  Acute toxic metabolic encephalopathy, resolved  Dementia:  Multifactorial likely in setting of missed dialysis prior to admission. Now fully oriented and appropriate. TSH WNL at 4.2. Baseline dementia per chart review as well as per discussion with her neighbor. Has some hallucinations as well as some lip tremor and hand and feet tremors,?Lewy body dementia - monitor mental status post-op  Stage 1 sacral ulcer with sebaceous cyst noted on attending physical exam this morning - barrier cream as needed  FEN/GI: renal diet, famotidine Prophylaxis: heparin gtt  Disposition: pending medical management and SNF placement  Subjective:  States pain is under good control. No concerns today No CP or SOB.  Objective: Temp:  [98 F (36.7 C)-99 F (37.2 C)] 98 F (36.7 C) (04/08 0446) Pulse Rate:  [75-97] 83 (04/08 0446) Resp:  [16-20] 16 (04/08 0446) BP: (108-137)/(51-67) 113/51 (04/08 0446) SpO2:  [94 %-99 %] 99 % (04/08 0446) Weight:  [59.3 kg (130 lb 11.7 oz)] 59.3 kg (130 lb 11.7 oz) (04/08 0446) Physical Exam: General: NAD, resting comfortably in bed Cardiovascular: RRR, no m/r/g Respiratory: CTAB, no W/R/R Abdomen: nontender, nondistended Extremities: R hip with bandage in place, no edema Neuro: alert and oriented  Laboratory:  Recent Labs Lab 03/12/17 0312 03/13/17 0243 03/15/17 0524  WBC 8.2 8.4 8.3  HGB 9.9* 10.7* 8.3*  HCT 31.2* 33.3* 26.6*  PLT 252 262 301    Recent Labs Lab 03/09/17 1407  03/13/17 0243 03/14/17 0223 03/15/17 0524  NA 137  < > 135  134* 134*  K 5.8*  < > 4.3 4.4 4.1  CL 108  < > 96* 101 99*  CO2 14*  < > 24 20* 20*  BUN 67*  < > 22* 33* 50*  CREATININE 9.24*  < > 4.54* 6.25* 7.39*  CALCIUM 9.6  < > 8.9 8.5* 8.7*  PROT 7.9  --   --   --   --   BILITOT 0.7  --   --   --   --   ALKPHOS 67  --   --   --   --   ALT 15  --    --   --   --   AST 21  --   --   --   --   GLUCOSE 154*  < > 103* 92 102*  < > = values in this interval not displayed.   Echo: EF 50-55%, severe basal septal hypertrophy, distal septal hypokinesis, moderate mitral regurg  Imaging/Diagnostic Tests: No results found.   Leland Her, DO,  03/15/2017, 6:57 AM PGY-1,  Family Medicine FPTS Intern pager: [phone], text pages welcome

## 2017-03-15 NOTE — Anesthesia Postprocedure Evaluation (Signed)
Anesthesia Post Note  Patient: Monica [name]  Procedure(s) Performed: Procedure(s) (LRB): RIGHT TOTAL HIP ARTHROPLASTY (Right)  Patient location during evaluation: PACU Anesthesia Type: General Level of consciousness: sedated and patient cooperative Pain management: pain level controlled Vital Signs Assessment: post-procedure vital signs reviewed and stable Respiratory status: spontaneous breathing Cardiovascular status: stable Anesthetic complications: no       Last Vitals:  Vitals:   03/15/17 1236 03/15/17 1420  BP: 124/71 (!) 125/53  Pulse: 86 99  Resp:  16  Temp:  37 C    Last Pain:  Vitals:   03/15/17 1420  TempSrc: Oral  PainSc:                  Lewie Loron

## 2017-03-16 ENCOUNTER — Encounter (HOSPITAL_COMMUNITY): Payer: Self-pay | Admitting: Orthopedic Surgery

## 2017-03-16 LAB — CBC
HEMATOCRIT: 24.7 % — AB (ref 36.0–46.0)
HEMOGLOBIN: 8.1 g/dL — AB (ref 12.0–15.0)
MCH: 25.2 pg — AB (ref 26.0–34.0)
MCHC: 32.8 g/dL (ref 30.0–36.0)
MCV: 76.7 fL — AB (ref 78.0–100.0)
Platelets: 265 10*3/uL (ref 150–400)
RBC: 3.22 MIL/uL — ABNORMAL LOW (ref 3.87–5.11)
RDW: 16.5 % — ABNORMAL HIGH (ref 11.5–15.5)
WBC: 8.8 10*3/uL (ref 4.0–10.5)

## 2017-03-16 LAB — BASIC METABOLIC PANEL
Anion gap: 9 (ref 5–15)
BUN: 25 mg/dL — AB (ref 6–20)
CHLORIDE: 99 mmol/L — AB (ref 101–111)
CO2: 25 mmol/L (ref 22–32)
CREATININE: 4.8 mg/dL — AB (ref 0.44–1.00)
Calcium: 8.3 mg/dL — ABNORMAL LOW (ref 8.9–10.3)
GFR calc Af Amer: 9 mL/min — ABNORMAL LOW (ref >60)
GFR, EST NON AFRICAN AMERICAN: 8 mL/min — AB (ref >60)
GLUCOSE: 109 mg/dL — AB (ref 65–99)
Potassium: 3.8 mmol/L (ref 3.5–5.1)
Sodium: 133 mmol/L — ABNORMAL LOW (ref 135–145)

## 2017-03-16 LAB — FERRITIN: Ferritin: 387 ng/mL — ABNORMAL HIGH (ref 11–307)

## 2017-03-16 LAB — IRON AND TIBC
IRON: 29 ug/dL (ref 28–170)
Saturation Ratios: 15 % (ref 10.4–31.8)
TIBC: 195 ug/dL — AB (ref 250–450)
UIBC: 166 ug/dL

## 2017-03-16 LAB — HEPARIN LEVEL (UNFRACTIONATED): Heparin Unfractionated: 0.5 IU/mL (ref 0.30–0.70)

## 2017-03-16 MED ORDER — SODIUM CHLORIDE 0.9 % IV SOLN: 100.0000 mL | INTRAVENOUS | Status: DC | PRN

## 2017-03-16 MED ORDER — DARBEPOETIN ALFA 100 MCG/0.5ML IJ SOSY
PREFILLED_SYRINGE | INTRAMUSCULAR | Status: AC
  Administered 2017-03-16: 100 ug via INTRAVENOUS
  Filled 2017-03-16: qty 0.5

## 2017-03-16 MED ORDER — HEPARIN SODIUM (PORCINE) 1000 UNIT/ML DIALYSIS: 2000.0000 [IU] | INTRAMUSCULAR | Status: DC | PRN

## 2017-03-16 MED ORDER — LIDOCAINE HCL (PF) 1 % IJ SOLN: 5.0000 mL | INTRAMUSCULAR | Status: DC | PRN

## 2017-03-16 MED ORDER — HEPARIN SODIUM (PORCINE) 5000 UNIT/ML IJ SOLN
5000.0000 [IU] | Freq: Three times a day (TID) | INTRAMUSCULAR | Status: DC
  Administered 2017-03-16 – 2017-03-19 (×8): 5000 [IU] via SUBCUTANEOUS
  Filled 2017-03-16 (×7): qty 1

## 2017-03-16 MED ORDER — LIDOCAINE-PRILOCAINE 2.5-2.5 % EX CREA
1.0000 "application " | TOPICAL_CREAM | CUTANEOUS | Status: DC | PRN
  Filled 2017-03-16: qty 5

## 2017-03-16 MED ORDER — HEPARIN SODIUM (PORCINE) 1000 UNIT/ML DIALYSIS: 1000.0000 [IU] | INTRAMUSCULAR | Status: DC | PRN

## 2017-03-16 MED ORDER — PENTAFLUOROPROP-TETRAFLUOROETH EX AERO: 1.0000 "application " | INHALATION_SPRAY | CUTANEOUS | Status: DC | PRN

## 2017-03-16 MED ORDER — ALTEPLASE 2 MG IJ SOLR: 2.0000 mg | Freq: Once | INTRAMUSCULAR | Status: DC | PRN

## 2017-03-16 NOTE — Progress Notes (Signed)
qPhysical Therapy Treatment Patient Details Name: Monica [name] MRN: [id number] DOB: September 27, 1932 Today's Date: 03/16/2017    History of Present Illness 83 y.o. female presenting s/p right hip hemiarthroplasty secondary to R proximal femoral neck fracture resulting from ground level fall at home. Pt with PMH significant for anxiety, arthritis, dementia, depression, ESRD, ETOH (quit in 1980s), stomach ulcers and pancreatic cysts.     PT Comments    Pt progressing well toward goals and tolerated ambulation. Pt significantly limited by cognition and requires frequent repeated verbal cues for safety with RW and to stay on task. Pt required multimodal cues and facilitation of weight shift to LLE for improved step length on R. Current d/c recommendation remains appropriate when medically ready. PT will continue to follow.    Follow Up Recommendations  SNF;Supervision/Assistance - 24 hour     Equipment Recommendations  None recommended by PT    Recommendations for Other Services       Precautions / Restrictions Precautions Precautions: Fall Precaution Booklet Issued: No Restrictions Weight Bearing Restrictions: Yes RLE Weight Bearing: Weight bearing as tolerated    Mobility  Bed Mobility Overal bed mobility: Needs Assistance Bed Mobility: Sidelying to Sit   Sidelying to sit: Mod assist;HOB elevated       General bed mobility comments: verbal cues and physical assist to bring LE to EOB. Pt required repeated verbal cues to stay on task. ModA to elevate trunk and verbal cues to push through R UE. Pt unable to scoot to EOB and required assist with bed pad   Transfers Overall transfer level: Needs assistance Equipment used: Rolling walker (2 wheeled) Transfers: Sit to/from [employer] Sit to Stand: Min guard Stand pivot transfers: Mod assist       General transfer comment: min guard for safety, however, no physical assist required. increased time and effort. verbal  cues for hand placement and safety with RW  Ambulation/Gait Ambulation/Gait assistance: Mod assist;+2 safety/equipment Ambulation Distance (Feet): 60 Feet Assistive device: Rolling walker (2 wheeled) Gait Pattern/deviations: Step-to pattern;Decreased step length - right;Decreased step length - left;Decreased stance time - left;Decreased weight shift to left;Antalgic;Trunk flexed;Drifts right/left;Narrow base of support Gait velocity: decreased Gait velocity interpretation: Below normal speed for age/gender General Gait Details: very slow speed. repeated verbal cues to stay on task as pt is easily distracted. verbal cues for sequencing and safety with RW. physical assist to facilitate weight shift on LLE for improved step length on R. Pt required physical assist for negotiation of RW. pt amb 30' with seated rest break and then another 30' back to room.   Stairs            Wheelchair Mobility    Modified Rankin (Stroke Patients Only)       Balance Overall balance assessment: Needs assistance Sitting-balance support: Feet supported;No upper extremity supported Sitting balance-Leahy Scale: Poor Sitting balance - Comments: requires assist and close supervision for seated balance   Standing balance support: Bilateral upper extremity supported Standing balance-Leahy Scale: Poor Standing balance comment: reliant on RW                            Cognition Arousal/Alertness: Awake/alert Behavior During Therapy: WFL for tasks assessed/performed Overall Cognitive Status: History of cognitive impairments - at baseline  General Comments: h/o dementia      Exercises      General Comments General comments (skin integrity, edema, etc.): Pt transferred to [facility] and required physical assist for standing balance during toilet hygeine.       Pertinent Vitals/Pain Pain Assessment: No/denies pain    Home Living                       Prior Function            PT Goals (current goals can now be found in the care plan section) Acute Rehab PT Goals Patient Stated Goal: none stated Progress towards PT goals: Progressing toward goals    Frequency    Min 3X/week      PT Plan Current plan remains appropriate    Co-evaluation             End of Session Equipment Utilized During Treatment: Gait belt Activity Tolerance: Patient tolerated treatment well Patient left: in chair;with call bell/phone within reach;with chair alarm set Nurse Communication: Mobility status PT Visit Diagnosis: Unsteadiness on feet (R26.81);Other abnormalities of gait and mobility (R26.89);History of falling (Z91.81);Muscle weakness (generalized) (M62.81);Pain     Time: 1610-9604 PT Time Calculation (min) (ACUTE ONLY): 34 min  Charges:  $Gait Training: 23-37 mins                    G Codes:       Lane Hacker, SPT Acute Rehab SPT [phone]     Lane Hacker 03/16/2017, 1:03 PM

## 2017-03-16 NOTE — Progress Notes (Signed)
S: No new CO.  No CP. O:BP 128/78 (BP Location: Right Arm)   Pulse 84   Temp 99.2 F (37.3 C) (Oral)   Resp 16   Ht  (1.549 m)   Wt 59.8 kg (131 lb 13.4 oz)   SpO2 100%   BMI 24.91 kg/m   Intake/Output Summary (Last 24 hours) at 03/16/17 0639 Last data filed at 03/16/17 0300  Gross per 24 hour  Intake           527.28 ml  Output              500 ml  Net            27.28 ml   Weight change: 0 kg (0 lb) ZOX:WRUEA and alert CVS: RRR Resp: Clear Abd:+ BA NTND Ext: No Edema   NEURO: CNI Ox3 Lt PC   . ALPRAZolam  0.5 mg Oral BID  . aspirin EC  81 mg Oral Daily  . atorvastatin  40 mg Oral q1800  . calcitRIOL  0.75 mcg Oral Q M,W,F-HD  . calcium acetate  667 mg Oral TID WC  . darbepoetin (ARANESP) injection - DIALYSIS  100 mcg Intravenous Q Mon-HD  . famotidine  20 mg Oral Daily  . feeding supplement (NEPRO CARB STEADY)  237 mL Oral BID BM  . ferric gluconate (FERRLECIT/NULECIT) IV  125 mg Intravenous Q M,W,F-HD  . metoprolol tartrate  25 mg Oral BID  . multivitamin  1 tablet Oral QHS  . penicillin v potassium  500 mg Oral Q8H  . sodium chloride flush  3 mL Intravenous Q12H  . venlafaxine XR  37.5 mg Oral QHS   No results found. BMET    Component Value Date/Time   NA 133 (L) 03/16/2017 0227   K 3.8 03/16/2017 0227   CL 99 (L) 03/16/2017 0227   CO2 25 03/16/2017 0227   GLUCOSE 109 (H) 03/16/2017 0227   BUN 25 (H) 03/16/2017 0227   CREATININE 4.80 (H) 03/16/2017 0227   CALCIUM 8.3 (L) 03/16/2017 0227   GFRNONAA 8 (L) 03/16/2017 0227   GFRAA 9 (L) 03/16/2017 0227   CBC    Component Value Date/Time   WBC 8.8 03/16/2017 0227   RBC 3.22 (L) 03/16/2017 0227   HGB 8.1 (L) 03/16/2017 0227   HCT 24.7 (L) 03/16/2017 0227   PLT 265 03/16/2017 0227   MCV 76.7 (L) 03/16/2017 0227   MCH 25.2 (L) 03/16/2017 0227   MCHC 32.8 03/16/2017 0227   RDW 16.5 (H) 03/16/2017 0227   LYMPHSABS 0.5 (L) 03/09/2017 1407   MONOABS 0.5 03/09/2017 1407   EOSABS 0.0 03/09/2017 1407    BASOSABS 0.0 03/09/2017 1407     Assessment:  1. Rt Hip Fx SP ORIF 2. Acute MI 3. ESRD MWF [facility] 4. Anemia on aranesp 5. Sec HPTH on calcitriol  Plan: 1. HD today 2. Note plans for SNF 3. Check iron studies   Darwin Rothlisberger T

## 2017-03-16 NOTE — Progress Notes (Signed)
Family Medicine Teaching Service Daily Progress Note Intern Pager: [phone]  Patient name: Monica [name] Medical record number: [id number] Date of birth: 06-24-1932 Age: 83 y.o. Gender: female  Primary Care Provider: No PCP Per Patient Consultants: Cardiology, Ortho Code Status: Full  Chief Complaint: AMS and right hip pain  Assessment and Plan: Monica [name] is a 83 y.o. female presenting with AMS and right hip pain . PMH is significant for HTN, ESRD with HD MWF, Anxiety, Dementia  Recent STEMI: septal ant MI on EKG. Troponins peak 1.31. -Telemetry -ASA 81 - Cardiology following, patient on heparin gtt. Appreciate recommendations, will ask regarding duration of anticoagulation since patient is approaching readiness for SNF. -Lopressor 25 mg BID, Atorvastatin 40 mg   Right Femoral Neck Fracture s/p R hip hemiarthroplasty 03/13/17: Likely from mechanical fall, unwitnessed. Now POD #3 with good pain control. - Per Orthopedics, WBAT RLE with rolling walker and up with PT/OT. Plan for 2 week outpatient pot-op visit with Dr. Linna Caprice. Appreciate recommendations -Tylenol 650 mg q6h prn mild pain and Norco 5-235mg  q6h prn for breakthrough  - vitamin D low (13.8), continue calcitriol supplementation. - nutrition consult - avoid PPI, continue H2 blocker instead  - PT/OT recommending SNF - SW consult, patient is approaching readiness for SNF placement  ESRD: HD on MWF - Continue home phoslo - Nephrology following, HD next 4/9 - Daily renal function panel  Anemia  Baseline ~11. Multifactorial from ESRD and R hip fracture s/p surgery.  Hgb this am stable at 8.1 - monitor CBC  Depression and Anxiety:  - Continue home Effexor and Xanax  Toothache Per speech evaluation, edema R upper maxilla with last molar sensitive to touch. - penicillin V - outpatient follow up with dentist  Acute toxic metabolic encephalopathy, resolved  Dementia:   - at baseline mental status  Stage 1 sacral  ulcer with sebaceous cyst noted on attending physical exam this morning - barrier cream as needed  FEN/GI: renal diet, famotidine Prophylaxis: heparin gtt  Disposition: pending SNF placement  Subjective:  States hip pain is under good control, wants to get up and walk. No concerns today. No CP or SOB.  Objective: Temp:  [97.8 F (36.6 C)-99.2 F (37.3 C)] 99.2 F (37.3 C) (04/08 2005) Pulse Rate:  [67-99] 84 (04/08 2005) Resp:  [16-19] 16 (04/08 2005) BP: (95-128)/(53-78) 128/78 (04/08 2005) SpO2:  [100 %] 100 % (04/08 2005) Weight:  [59.3 kg (130 lb 11.7 oz)-59.8 kg (131 lb 13.4 oz)] 59.8 kg (131 lb 13.4 oz) (04/08 1233) Physical Exam: General: NAD, resting comfortably in bed Cardiovascular: RRR, no m/r/g Respiratory: CTAB, no W/R/R Abdomen: nontender, nondistended Extremities: R hip with bandage in place, no edema Neuro: alert and oriented  Laboratory:  Recent Labs Lab 03/13/17 0243 03/15/17 0524 03/16/17 0227  WBC 8.4 8.3 8.8  HGB 10.7* 8.3* 8.1*  HCT 33.3* 26.6* 24.7*  PLT 262 301 265    Recent Labs Lab 03/09/17 1407  03/14/17 0223 03/15/17 0524 03/16/17 0227  NA 137  < > 134* 134* 133*  K 5.8*  < > 4.4 4.1 3.8  CL 108  < > 101 99* 99*  CO2 14*  < > 20* 20* 25  BUN 67*  < > 33* 50* 25*  CREATININE 9.24*  < > 6.25* 7.39* 4.80*  CALCIUM 9.6  < > 8.5* 8.7* 8.3*  PROT 7.9  --   --   --   --   BILITOT 0.7  --   --   --   --  ALKPHOS 67  --   --   --   --   ALT 15  --   --   --   --   AST 21  --   --   --   --   GLUCOSE 154*  < > 92 102* 109*  < > = values in this interval not displayed.   Echo: EF 50-55%, severe basal septal hypertrophy, distal septal hypokinesis, moderate mitral regurg  Imaging/Diagnostic Tests: No results found.   Leland Her, DO,  03/16/2017, 6:59 AM PGY-1, [facility] FPTS Intern pager: [phone], text pages welcome

## 2017-03-16 NOTE — Progress Notes (Signed)
Patient off the floor for dialysis. Noted cardiology stopped heparin gtt today. Currently receiving SQ heparin. Pleas discharge on ASA 81 mg PO BID due to FALL RISK and RISK of BLEEDING.

## 2017-03-16 NOTE — Progress Notes (Signed)
Subjective:  Patient denies any chest pain or shortness of breath.  Up in chair.  States walked earlier today.  Objective:  Vital Signs in the last 24 hours: Temp:  [98.6 F (37 C)-99.2 F (37.3 C)] 98.7 F (37.1 C) (04/09 0651) Pulse Rate:  [80-99] 80 (04/09 0651) Resp:  [16] 16 (04/09 0651) BP: (121-128)/(50-78) 121/50 (04/09 0651) SpO2:  [100 %] 100 % (04/09 0651) Weight:  [133 lb 13.1 oz (60.7 kg)] 133 lb 13.1 oz (60.7 kg) (04/09 0651)  Intake/Output from previous day: 04/08 0701 - 04/09 0700 In: 527.3 [P.O.:360; I.V.:167.3] Out: 500  Intake/Output from this shift: Total I/O In: -  Out: 250 [Urine:250]  Physical Exam: Neck: no adenopathy, no carotid bruit, no JVD and supple, symmetrical, trachea midline Lungs: clear to auscultation bilaterally Heart: regular rate and rhythm, S1, S2 normal and soft systolic murmur noted Abdomen: regular rate and rhythm, S1, S2 normal and soft, nontender Extremities: extremities normal, atraumatic, no cyanosis or edema  Lab Results:  Recent Labs  03/15/17 0524 03/16/17 0227  WBC 8.3 8.8  HGB 8.3* 8.1*  PLT 301 265    Recent Labs  03/15/17 0524 03/16/17 0227  NA 134* 133*  K 4.1 3.8  CL 99* 99*  CO2 20* 25  GLUCOSE 102* 109*  BUN 50* 25*  CREATININE 7.39* 4.80*   No results for input(s): TROPONINI in the last 72 hours.  Invalid input(s): CK, MB Hepatic Function Panel No results for input(s): PROT, ALBUMIN, AST, ALT, ALKPHOS, BILITOT, BILIDIR, IBILI in the last 72 hours. No results for input(s): CHOL in the last 72 hours. No results for input(s): PROTIME in the last 72 hours.  Imaging: Imaging results have been reviewed and No results found.  Cardiac Studies:  Assessment/Plan:  Right femoral neck fracture S/P right hip hemiarthroplasty Recent septal wall MI Hypertension ESRD on hemodialysis Paroxysmal atrial flutter GERD Anemia of chronic disease Dementia Plan Continue present management. Increase  ambulation as tolerated. Okay to DC heparin.  From cardiac point of view Awaiting skilled nursing facility   LOS: 7 days    Rinaldo Cloud 03/16/2017, 2:00 PM

## 2017-03-16 NOTE — Progress Notes (Addendum)
SeeADDENDUM below: IV heparin discontinued by MD/ changed to Sq heparin for VTE prophylaxis.  Noah Delaine, [facility] 03/16/2017 5:24 PM    ANTICOAGULATION CONSULT NOTE - Follow Up Consult  Pharmacy Consult for heparin Indication: NSTEMI  Allergies  Allergen Reactions  . Aspirin Other (See Comments)    "makes my stomach hurt"  . Codeine Other (See Comments)    "i get irritated"    Patient Measurements: Height:  (154.9 cm) Weight: 133 lb 13.1 oz (60.7 kg) IBW/kg (Calculated) : 47.8 Heparin Dosing Weight:  60kg  Vital Signs: Temp: 98.7 F (37.1 C) (04/09 0651) Temp Source: Oral (04/09 0651) BP: 121/50 (04/09 0651) Pulse Rate: 80 (04/09 0651)  Labs:  Recent Labs  03/14/17 0223 03/15/17 0524 03/15/17 1506 03/15/17 2159 03/16/17 0227  HGB  --  8.3*  --   --  8.1*  HCT  --  26.6*  --   --  24.7*  PLT  --  301  --   --  265  HEPARINUNFRC  --   --  0.82* 0.52 0.50  CREATININE 6.25* 7.39*  --   --  4.80*    Estimated Creatinine Clearance: 7.6 mL/min (A) (by C-G formula based on SCr of 4.8 mg/dL (H)).   Assessment:  Anticoag:84 y.o Female who has a h/o PAF, not on anticoag PTA due to hx of GIB. She  presented to [facility] on 03/09/17 and she was started on IV Heparin drip on for NSTEMI, resumed 4/8 post-OR.  Today the Heparin level remains therapeutic (0.5) on gtt at 850 units/hr. Hgb down to 8.1. POD #3 and Anemia of chronic disease.  Pltc wnl/stable. No overt bleeding noted.  Goal of Therapy:  Heparin level 0.3-0.7 units/ml Monitor platelets by anticoagulation protocol: Yes   Plan:  Continue heparin at 850 units/hr. Daily HL, CBC Do you plan to start  oral anticoagulation for VTE ppx s/p R THA.  Noah Delaine, RPh Clinical Pharmacist Pager: [phone] 03/16/2017,11:02 AM    ADDENDUM:    IV heparin discontinued by [facility] Medicine MD per recommendation by Dr. Sharyn Lull and changed to heparin for VTE prophylaxis. Patient has ESRD on HD qMWF  Plan:  IV heparin  discontinued. Start heparin 5000 units SQ q8 hours for VTE prophylaxis Pharmacy will sign off.   Noah Delaine, RPh Clinical Pharmacist Pager: [phone] 03/16/2017 5:25 PM

## 2017-03-17 DIAGNOSIS — R778 Other specified abnormalities of plasma proteins: Secondary | ICD-10-CM

## 2017-03-17 DIAGNOSIS — R748 Abnormal levels of other serum enzymes: Secondary | ICD-10-CM

## 2017-03-17 DIAGNOSIS — R7989 Other specified abnormal findings of blood chemistry: Secondary | ICD-10-CM

## 2017-03-17 LAB — CBC
HCT: 26.8 % — ABNORMAL LOW (ref 36.0–46.0)
HCT: 27 % — ABNORMAL LOW (ref 36.0–46.0)
HEMOGLOBIN: 8.4 g/dL — AB (ref 12.0–15.0)
HEMOGLOBIN: 8.4 g/dL — AB (ref 12.0–15.0)
MCH: 24.1 pg — AB (ref 26.0–34.0)
MCH: 24.3 pg — ABNORMAL LOW (ref 26.0–34.0)
MCHC: 31.3 g/dL (ref 30.0–36.0)
MCHC: 31.1 g/dL (ref 30.0–36.0)
MCV: 76.8 fL — ABNORMAL LOW (ref 78.0–100.0)
MCV: 78.3 fL (ref 78.0–100.0)
PLATELETS: 339 10*3/uL (ref 150–400)
Platelets: 263 10*3/uL (ref 150–400)
RBC: 3.49 MIL/uL — AB (ref 3.87–5.11)
RBC: 3.45 MIL/uL — ABNORMAL LOW (ref 3.87–5.11)
RDW: 16.4 % — ABNORMAL HIGH (ref 11.5–15.5)
RDW: 16.7 % — ABNORMAL HIGH (ref 11.5–15.5)
WBC: 8.7 10*3/uL (ref 4.0–10.5)
WBC: 11.2 10*3/uL — ABNORMAL HIGH (ref 4.0–10.5)

## 2017-03-17 LAB — RENAL FUNCTION PANEL
ALBUMIN: 2.6 g/dL — AB (ref 3.5–5.0)
ANION GAP: 10 (ref 5–15)
BUN: 13 mg/dL (ref 6–20)
CALCIUM: 8.9 mg/dL (ref 8.9–10.3)
CO2: 26 mmol/L (ref 22–32)
Chloride: 100 mmol/L — ABNORMAL LOW (ref 101–111)
Creatinine, Ser: 3.53 mg/dL — ABNORMAL HIGH (ref 0.44–1.00)
GFR, EST AFRICAN AMERICAN: 13 mL/min — AB (ref >60)
GFR, EST NON AFRICAN AMERICAN: 11 mL/min — AB (ref >60)
Glucose, Bld: 97 mg/dL (ref 65–99)
Phosphorus: 2.6 mg/dL (ref 2.5–4.6)
Potassium: 4 mmol/L (ref 3.5–5.1)
SODIUM: 136 mmol/L (ref 135–145)

## 2017-03-17 LAB — TYPE AND SCREEN
ABO/RH(D): A POS
ANTIBODY SCREEN: NEGATIVE
UNIT DIVISION: 0
UNIT DIVISION: 0

## 2017-03-17 LAB — BASIC METABOLIC PANEL
ANION GAP: 16 — AB (ref 5–15)
BUN: 28 mg/dL — ABNORMAL HIGH (ref 6–20)
CALCIUM: 9.4 mg/dL (ref 8.9–10.3)
CO2: 24 mmol/L (ref 22–32)
Chloride: 96 mmol/L — ABNORMAL LOW (ref 101–111)
Creatinine, Ser: 5 mg/dL — ABNORMAL HIGH (ref 0.44–1.00)
GFR, EST AFRICAN AMERICAN: 8 mL/min — AB (ref >60)
GFR, EST NON AFRICAN AMERICAN: 7 mL/min — AB (ref >60)
Glucose, Bld: 113 mg/dL — ABNORMAL HIGH (ref 65–99)
Potassium: 4.1 mmol/L (ref 3.5–5.1)
SODIUM: 136 mmol/L (ref 135–145)

## 2017-03-17 LAB — BPAM RBC
BLOOD PRODUCT EXPIRATION DATE: 201804252359
Blood Product Expiration Date: 201804252359
ISSUE DATE / TIME: 201804061804
ISSUE DATE / TIME: 201804081008
UNIT TYPE AND RH: 6200
Unit Type and Rh: 6200

## 2017-03-17 LAB — MAGNESIUM: MAGNESIUM: 2 mg/dL (ref 1.7–2.4)

## 2017-03-17 LAB — HEPARIN LEVEL (UNFRACTIONATED)

## 2017-03-17 MED ORDER — CLOPIDOGREL BISULFATE 75 MG PO TABS
75.0000 mg | ORAL_TABLET | Freq: Every day | ORAL | Status: DC
  Administered 2017-03-17 – 2017-03-19 (×3): 75 mg via ORAL
  Filled 2017-03-17 (×3): qty 1

## 2017-03-17 MED ORDER — HYDROCODONE-ACETAMINOPHEN 5-325 MG PO TABS: 1.0000 | ORAL_TABLET | Freq: Four times a day (QID) | ORAL | 0 refills | Status: DC | PRN

## 2017-03-17 MED ORDER — ASPIRIN 81 MG PO TBEC
81.0000 mg | DELAYED_RELEASE_TABLET | Freq: Every day | ORAL | 1 refills | Status: DC
Start: 2017-03-17 — End: 2017-04-15

## 2017-03-17 NOTE — Care Management Important Message (Signed)
Important Message  Patient Details  Name: Monica [name] MRN: [id number] Date of Birth: [birth year]   Medicare Important Message Given:  Yes    Kyla Balzarine 03/17/2017, 11:48 AM

## 2017-03-17 NOTE — Progress Notes (Signed)
Subjective:  Complains of occasional chest pain with walking without associated symptoms. Overall feels better. Patient is off heparin  Objective:  Vital Signs in the last 24 hours: Temp:  [98.3 F (36.8 C)-99.7 F (37.6 C)] 98.6 F (37 C) (04/10 0400) Pulse Rate:  [90-104] 95 (04/10 0400) Resp:  [18-24] 18 (04/10 0400) BP: (107-137)/(47-71) 112/47 (04/10 0400) SpO2:  [94 %-98 %] 94 % (04/10 0400) Weight:  [129 lb 10.1 oz (58.8 kg)-135 lb 9.3 oz (61.5 kg)] 129 lb 10.1 oz (58.8 kg) (04/10 0400)  Intake/Output from previous day: 04/09 0701 - 04/10 0700 In: 460 [P.O.:360; IV Piggyback:100] Out: 1250 [Urine:250] Intake/Output from this shift: No intake/output data recorded.  Physical Exam: Neck: no adenopathy, no carotid bruit, no JVD and supple, symmetrical, trachea midline Lungs: clear to auscultation bilaterally Heart: regular rate and rhythm, S1, S2 normal and Soft systolic murmur noted Abdomen: soft, non-tender; bowel sounds normal; no masses,  no organomegaly Extremities: extremities normal, atraumatic, no cyanosis or edema  Lab Results:  Recent Labs  03/16/17 0227 03/17/17 0243  WBC 8.8 8.7  HGB 8.1* 8.4*  PLT 265 263    Recent Labs  03/16/17 0227 03/17/17 0243  NA 133* 136  K 3.8 4.0  CL 99* 100*  CO2 25 26  GLUCOSE 109* 97  BUN 25* 13  CREATININE 4.80* 3.53*   No results for input(s): TROPONINI in the last 72 hours.  Invalid input(s): CK, MB Hepatic Function Panel  Recent Labs  03/17/17 0243  ALBUMIN 2.6*   No results for input(s): CHOL in the last 72 hours. No results for input(s): PROTIME in the last 72 hours.  Imaging: Imaging results have been reviewed and No results found.  Cardiac Studies:  Assessment/Plan:  Right femoral neck fracture S/P right hip hemiarthroplasty Recent septal wall MI Hypertension ESRD on hemodialysis Paroxysmal atrial flutter GERD Anemia of chronic disease Dementia Plan Reduce aspirin to 81 mg daily Add  clopidogrel 75 mg one tablet daily Patient is not a candidate for chronic anticoagulation due to high risk for fall. Medical management for now, discussed with patient and agrees   LOS: 8 days    Rinaldo Cloud 03/17/2017, 9:38 AM

## 2017-03-17 NOTE — Clinical Social Work Placement (Signed)
   CLINICAL SOCIAL WORK PLACEMENT  NOTE  Date:  03/17/2017  Patient Details  Name: MYKAEL TROTT MRN: [id number] Date of Birth: [birth year]  Clinical Social Work is seeking post-discharge placement for this patient at the Skilled  Nursing Facility level of care (*CSW will initial, date and re-position this form in  chart as items are completed):  Yes   Patient/family provided with [facility] Clinical Social Work Department's list of facilities offering this level of care within the geographic area requested by the patient (or if unable, by the patient's family).  Yes   Patient/family informed of their freedom to choose among providers that offer the needed level of care, that participate in Medicare, Medicaid or managed care program needed by the patient, have an available bed and are willing to accept the patient.  Yes   Patient/family informed of [facility]'s ownership interest in [facility] and [facility], as well as of the fact that they are under no obligation to receive care at these facilities.  PASRR submitted to EDS on       PASRR number received on       Existing PASRR number confirmed on       FL2 transmitted to all facilities in geographic area requested by pt/family on       FL2 transmitted to all facilities within larger geographic area on       Patient informed that his/her managed care company has contracts with or will negotiate with certain facilities, including the following:            Patient/family informed of bed offers received.  Patient chooses bed at       Physician recommends and patient chooses bed at      Patient to be transferred to   on  .  Patient to be transferred to facility by       Patient family notified on   of transfer.  Name of family member notified:        PHYSICIAN Please sign FL2     Additional Comment:    _______________________________________________ Althea Charon, LCSW 03/17/2017, 4:12 PM

## 2017-03-17 NOTE — Clinical Social Work Note (Signed)
Clinical Social Work Assessment  Patient Details  Name: Monica [name] MRN: [id number] Date of Birth: [birth year]  Date of referral:  03/17/17               Reason for consult:  Discharge Planning                Permission sought to share information with:    Permission granted to share information::     Name::        Agency::     Relationship::     Contact Information:     Housing/Transportation Living arrangements for the past 2 months:  Single Family Home Source of Information:  Patient, Other (Comment Required) (granddaughter) Patient Interpreter Needed:  None Criminal Activity/Legal Involvement Pertinent to Current Situation/Hospitalization:  No - Comment as needed Significant Relationships:  None Lives with:    Do you feel safe going back to the place where you live?  No Need for family participation in patient care:  Yes (Comment)  Care giving concerns: Patient lives alone and does not understand that she is unable to care of herself at this time   Facilities manager / plan:  CSW met patient at bedside to offer support and discuss discharge needs. Patient stated she does not want to go to a SNF and would like to go home. Patent stated her children will move back home to help her after discharge. When asked by CSW the children's name or how long they would stay, patient was unable to answer questions. Patient has poor understanding of current medical state. CSW reached out to patient granddaughter but granddaughter stated that she will reach out to patients daughter to make SNF decision. Employment status:  Retired Forensic scientist:  [employer] PT Recommendations:  [place] / Referral to community resources:  [place]  Patient/Family's Response to care:  Patient does not want to go to a SNF stating "she's tired of people living her life for her"   Patient/Family's Understanding of and Emotional Response to Diagnosis, Current  Treatment, and Prognosis:  Patient does not have good understanding of current medical state and limitations around most recent hospitalization.   Emotional Assessment Appearance:    Attitude/Demeanor/Rapport:  Other Affect (typically observed):  Pleasant Orientation:  Oriented to Self, Oriented to Place, Oriented to  Time Alcohol / Substance use:  Not Applicable Psych involvement (Current and /or in the community):  No (Comment)  Discharge Needs  Concerns to be addressed:  Home Safety Concerns Readmission within the last 30 days:  No Current discharge risk:  Lack of support system Barriers to Discharge:  No Barriers Identified   [name], LCSW 03/17/2017, 3:51 PM

## 2017-03-17 NOTE — Progress Notes (Signed)
S: No new CO.  No problems with HD in AM O:BP (!) 112/47 (BP Location: Right Arm)   Pulse 95   Temp 98.6 F (37 C) (Oral)   Resp 18   Ht  (1.549 m)   Wt 58.8 kg (129 lb 10.1 oz)   SpO2 94%   BMI 24.49 kg/m   Intake/Output Summary (Last 24 hours) at 03/17/17 0650 Last data filed at 03/16/17 1800  Gross per 24 hour  Intake              460 ml  Output             1250 ml  Net             -790 ml   Weight change: 2.2 kg (4 lb 13.6 oz) RUE:AVWUJ and alert CVS: RRR Resp: Clear Abd:+ BS NTND Ext: No Edema   NEURO: CNI Ox3 Lt PC   . ALPRAZolam  0.5 mg Oral BID  . aspirin EC  81 mg Oral Daily  . atorvastatin  40 mg Oral q1800  . calcitRIOL  0.75 mcg Oral Q M,W,F-HD  . calcium acetate  667 mg Oral TID WC  . darbepoetin (ARANESP) injection - DIALYSIS  100 mcg Intravenous Q Mon-HD  . famotidine  20 mg Oral Daily  . feeding supplement (NEPRO CARB STEADY)  237 mL Oral BID BM  . ferric gluconate (FERRLECIT/NULECIT) IV  125 mg Intravenous Q M,W,F-HD  . heparin subcutaneous  5,000 Units Subcutaneous Q8H  . metoprolol tartrate  25 mg Oral BID  . multivitamin  1 tablet Oral QHS  . penicillin v potassium  500 mg Oral Q8H  . sodium chloride flush  3 mL Intravenous Q12H  . venlafaxine XR  37.5 mg Oral QHS   No results found. BMET    Component Value Date/Time   NA 136 03/17/2017 0243   K 4.0 03/17/2017 0243   CL 100 (L) 03/17/2017 0243   CO2 26 03/17/2017 0243   GLUCOSE 97 03/17/2017 0243   BUN 13 03/17/2017 0243   CREATININE 3.53 (H) 03/17/2017 0243   CALCIUM 8.9 03/17/2017 0243   GFRNONAA 11 (L) 03/17/2017 0243   GFRAA 13 (L) 03/17/2017 0243   CBC    Component Value Date/Time   WBC 8.7 03/17/2017 0243   RBC 3.49 (L) 03/17/2017 0243   HGB 8.4 (L) 03/17/2017 0243   HCT 26.8 (L) 03/17/2017 0243   PLT 263 03/17/2017 0243   MCV 76.8 (L) 03/17/2017 0243   MCH 24.1 (L) 03/17/2017 0243   MCHC 31.3 03/17/2017 0243   RDW 16.4 (H) 03/17/2017 0243   LYMPHSABS 0.5 (L)  03/09/2017 1407   MONOABS 0.5 03/09/2017 1407   EOSABS 0.0 03/09/2017 1407   BASOSABS 0.0 03/09/2017 1407     Assessment:  1. Rt Hip Fx SP ORIF 2. Acute MI 3. ESRD MWF [facility] 4. Anemia on aranesp 5. Sec HPTH on calcitriol  Plan: 1. IV iron 2. HD tomorrow Monica [name]

## 2017-03-17 NOTE — Progress Notes (Signed)
Patient had a 35 beat run of vtach. MD notified. New orders given and implemented.  Rayshard Schirtzinger, Charlaine Dalton RN

## 2017-03-17 NOTE — Progress Notes (Signed)
   Subjective:  Patient reports pain as mild.  No c/o.  Objective:   VITALS:   Vitals:   03/16/17 1730 03/16/17 1740 03/16/17 1935 03/17/17 0400  BP: 121/66 126/70 131/67 (!) 112/47  Pulse: 100 (!) 101 (!) 104 95  Resp:  (!) Temp:  99.1 F (37.3 C) 99.7 F (37.6 C) 98.6 F (37 C)  TempSrc:  Oral Oral Oral  SpO2:  97% 98% 94%  Weight:  60.2 kg (132 lb 11.5 oz)  58.8 kg (129 lb 10.1 oz)  Height:        NAD ABD soft Sensation intact distally Intact pulses distally Dorsiflexion/Plantar flexion intact Incision: dressing C/D/I Compartment soft   Lab Results  Component Value Date   WBC 8.7 03/17/2017   HGB 8.4 (L) 03/17/2017   HCT 26.8 (L) 03/17/2017   MCV 76.8 (L) 03/17/2017   PLT 263 03/17/2017   BMET    Component Value Date/Time   NA 136 03/17/2017 0243   K 4.0 03/17/2017 0243   CL 100 (L) 03/17/2017 0243   CO2 26 03/17/2017 0243   GLUCOSE 97 03/17/2017 0243   BUN 13 03/17/2017 0243   CREATININE 3.53 (H) 03/17/2017 0243   CALCIUM 8.9 03/17/2017 0243   GFRNONAA 11 (L) 03/17/2017 0243   GFRAA 13 (L) 03/17/2017 0243     Assessment/Plan: 4 Days Post-Op   Active Problems:   HTN (hypertension)   ESRD on dialysis (HCC)   Hip fracture (HCC)   Femoral neck fracture (HCC)   Displaced fracture of right femoral neck (HCC)   ST elevation myocardial infarction (STEMI) (HCC)   Closed displaced fracture of left femoral neck (HCC)   Acute delirium   Malnutrition of moderate degree   Acute blood loss anemia   Anemia of renal disease   WBAT with walker DVT ppx: ASA 81 mg PO BID x6 weeks, SCDs, TEDs PO pain control PT/OT D/C planning: pt states her children are willing to move in with her with HHPT vs SNF   Delante Karapetyan, Cloyde Reams 03/17/2017, 8:11 AM   Samson Frederic, MD Cell [phone]

## 2017-03-17 NOTE — Progress Notes (Signed)
Family Medicine Teaching Service Daily Progress Note Intern Pager: [phone]  Patient name: Monica [name] Medical record number: [id number] Date of birth: 1932/11/15 Age: 83 y.o. Gender: female  Primary Care Provider: No PCP Per Patient Consultants: Cardiology, Ortho Code Status: Full  Chief Complaint: AMS and right hip pain  Assessment and Plan: Monica [name] is a 83 y.o. female presenting with AMS and right hip pain . PMH is significant for HTN, ESRD with HD MWF, Anxiety, Dementia  Recent STEMI: septal ant MI on EKG. Troponins peak 1.31. -Telemetry -Cardiology following, ASA81 qd and plavix  qd. Patient is not a candidate for chronic anticoagulation -Lopressor 25 mg BID, Atorvastatin 40 mg   Right Femoral Neck Fracture s/p R hip hemiarthroplasty 03/13/17: Likely from mechanical fall, unwitnessed. Now POD #4 with good pain control. - Per Orthopedics, WBAT RLE with rolling walker and up with PT/OT. In agreement with ASA81qd and Plavix  qd. Outpatient follow up in 2 weeks for post-op visit with Dr. Linna Caprice.  -Tylenol 650 mg q6h prn  - vitamin D low (13.8), continue calcitriol supplementation. - nutrition consult - avoid PPI, continue H2 blocker instead  - PT/OT recommending SNF - SW consult, patient is ready for SNF placement  ESRD: HD on MWF - Continue home phoslo - Nephrology following, HD next 4/9 - Daily renal function panel  Anemia  Baseline ~11. Multifactorial from ESRD and R hip fracture s/p surgery.  Hgb stable at 8.4 - monitor CBC  Depression and Anxiety:  - Continue home Effexor and Xanax  Toothache Per speech evaluation, edema R upper maxilla with last molar sensitive to touch. - penicillin V - outpatient follow up with dentist  Acute toxic metabolic encephalopathy, resolved  Dementia:   - at baseline mental status  Stage 1 sacral ulcer with sebaceous cyst noted on attending physical exam this morning - barrier cream as needed  FEN/GI: renal diet,  famotidine Prophylaxis: heparin gtt  Disposition: patient is medically ready for SNF   Subjective:  States hip pain is under good control. No concerns today. No CP or SOB.  Objective: Temp:  [98.3 F (36.8 C)-99.7 F (37.6 C)] 98.6 F (37 C) (04/10 0400) Pulse Rate:  [90-104] 95 (04/10 0400) Resp:  [18-24] 18 (04/10 0400) BP: (107-137)/(47-71) 112/47 (04/10 0400) SpO2:  [94 %-98 %] 94 % (04/10 0400) Weight:  [58.8 kg (129 lb 10.1 oz)-61.5 kg (135 lb 9.3 oz)] 58.8 kg (129 lb 10.1 oz) (04/10 0400) Physical Exam: General: NAD, resting comfortably in bed Cardiovascular: RRR, no m/r/g Respiratory: CTAB, no W/R/R Abdomen: nontender, nondistended Extremities: R hip with bandage in place, no edema Neuro: alert and oriented  Laboratory:  Recent Labs Lab 03/15/17 0524 03/16/17 0227 03/17/17 0243  WBC 8.3 8.8 8.7  HGB 8.3* 8.1* 8.4*  HCT 26.6* 24.7* 26.8*  PLT 301 265 263    Recent Labs Lab 03/15/17 0524 03/16/17 0227 03/17/17 0243  NA 134* 133* 136  K 4.1 3.8 4.0  CL 99* 99* 100*  CO2 20* 25 26  BUN 50* 25* 13  CREATININE 7.39* 4.80* 3.53*  CALCIUM 8.7* 8.3* 8.9  GLUCOSE 102* 109* 97     Echo: EF 50-55%, severe basal septal hypertrophy, distal septal hypokinesis, moderate mitral regurg  Imaging/Diagnostic Tests: No results found.   Leland Her, DO,  03/17/2017, 7:04 AM PGY-1, [facility] FPTS Intern pager: [phone], text pages welcome

## 2017-03-17 NOTE — Progress Notes (Signed)
qPhysical Therapy Treatment Patient Details Name: Monica [name] MRN: [id number] DOB: [birth year] Today's Date: 03/17/2017    History of Present Illness 83 y.o. female presenting s/p right hip hemiarthroplasty secondary to R proximal femoral neck fracture resulting from ground level fall at home. Pt with PMH significant for anxiety, arthritis, dementia, depression, ESRD, ETOH (quit in 1980s), stomach ulcers and pancreatic cysts.     PT Comments    Pt is up to walk with assistance and reviewed exercises for LE's.  Plan for SNF is very good as pt had elevated pulse to 148 with walk and then dropped down to 130.  Her acute therapy should continue as she was at home before, and will not be able to return there without more independence.  Pt is cooperative and should progress toward PLOF.  Follow Up Recommendations  SNF     Equipment Recommendations  None recommended by PT    Recommendations for Other Services       Precautions / Restrictions Precautions Precautions: Fall Restrictions Weight Bearing Restrictions: Yes RLE Weight Bearing: Weight bearing as tolerated    Mobility  Bed Mobility               General bed mobility comments: up in chair when PT arrived  Transfers Overall transfer level: Needs assistance Equipment used: Rolling walker (2 wheeled);1 person hand held assist Transfers: Sit to/from Stand Sit to Stand: Min assist         General transfer comment: needed help from chair and does not remember hand placement, repetitive cues  Ambulation/Gait Ambulation/Gait assistance: Min assist Ambulation Distance (Feet): 75 Feet Assistive device: Rolling walker (2 wheeled) Gait Pattern/deviations: Step-through pattern;Step-to pattern;Narrow base of support;Trunk flexed;Decreased weight shift to right;Decreased stride length Gait velocity: decreased Gait velocity interpretation: Below normal speed for age/gender General Gait Details: slow pace but pt is able to  progress with help to direct walker   Stairs            Wheelchair Mobility    Modified Rankin (Stroke Patients Only)       Balance Overall balance assessment: Needs assistance Sitting-balance support: Feet supported;Bilateral upper extremity supported Sitting balance-Leahy Scale: Fair   Postural control: Posterior lean Standing balance support: Bilateral upper extremity supported Standing balance-Leahy Scale: Poor Standing balance comment: depending on walker but is shifting legs as well to assist the effort                            Cognition Arousal/Alertness: Awake/alert Behavior During Therapy: WFL for tasks assessed/performed Overall Cognitive Status: History of cognitive impairments - at baseline                                 General Comments: h/o dementia      Exercises General Exercises - Lower Extremity Ankle Circles/Pumps: AROM;Both;5 reps Long Arc Quad: AROM;AAROM;Both;10 reps Heel Slides: AROM;Both;10 reps Hip ABduction/ADduction: AROM;AAROM;Both;15 reps    General Comments        Pertinent Vitals/Pain Pain Assessment: Faces Faces Pain Scale: Hurts little more Pain Location: R hip Pain Descriptors / Indicators: Operative site guarding Pain Intervention(s): Monitored during session;Premedicated before session;Repositioned    Home Living                      Prior Function            PT Goals (  current goals can now be found in the care plan section) Acute Rehab PT Goals Patient Stated Goal: none stated Progress towards PT goals: Progressing toward goals    Frequency    Min 3X/week      PT Plan Current plan remains appropriate    Co-evaluation             End of Session Equipment Utilized During Treatment: Gait belt Activity Tolerance: Patient tolerated treatment well;Other (comment);Treatment limited secondary to medical complications (Comment) (pulse up to 148 with walk but then  dropped down afterward) Patient left: in chair;with call bell/phone within reach;with chair alarm set Nurse Communication: Mobility status;Other (comment) (pulse with gait) PT Visit Diagnosis: Unsteadiness on feet (R26.81);Other abnormalities of gait and mobility (R26.89);History of falling (Z91.81);Muscle weakness (generalized) (M62.81);Pain Pain - Right/Left: Right Pain - part of body: Hip     Time: 1610-9604 PT Time Calculation (min) (ACUTE ONLY): 28 min  Charges:  $Gait Training: 8-22 mins $Therapeutic Exercise: 8-22 mins                    G Codes:  Functional Assessment Tool Used: AM-PAC 6 Clicks Basic Mobility      Ivar Drape 03/17/2017, 11:21 AM

## 2017-03-18 DIAGNOSIS — L89152 Pressure ulcer of sacral region, stage 2: Secondary | ICD-10-CM

## 2017-03-18 LAB — RENAL FUNCTION PANEL
Albumin: 2.6 g/dL — ABNORMAL LOW (ref 3.5–5.0)
Anion gap: 13 (ref 5–15)
BUN: 34 mg/dL — AB (ref 6–20)
CHLORIDE: 98 mmol/L — AB (ref 101–111)
CO2: 24 mmol/L (ref 22–32)
CREATININE: 5.94 mg/dL — AB (ref 0.44–1.00)
Calcium: 9.1 mg/dL (ref 8.9–10.3)
GFR calc Af Amer: 7 mL/min — ABNORMAL LOW (ref >60)
GFR, EST NON AFRICAN AMERICAN: 6 mL/min — AB (ref >60)
GLUCOSE: 92 mg/dL (ref 65–99)
POTASSIUM: 3.9 mmol/L (ref 3.5–5.1)
Phosphorus: 3.3 mg/dL (ref 2.5–4.6)
Sodium: 135 mmol/L (ref 135–145)

## 2017-03-18 LAB — CBC
HEMATOCRIT: 25.4 % — AB (ref 36.0–46.0)
Hemoglobin: 8 g/dL — ABNORMAL LOW (ref 12.0–15.0)
MCH: 24.3 pg — ABNORMAL LOW (ref 26.0–34.0)
MCHC: 31.5 g/dL (ref 30.0–36.0)
MCV: 77.2 fL — AB (ref 78.0–100.0)
Platelets: 339 10*3/uL (ref 150–400)
RBC: 3.29 MIL/uL — ABNORMAL LOW (ref 3.87–5.11)
RDW: 16.4 % — AB (ref 11.5–15.5)
WBC: 11.6 10*3/uL — AB (ref 4.0–10.5)

## 2017-03-18 MED ORDER — ACETAMINOPHEN 325 MG PO TABS
ORAL_TABLET | ORAL | Status: AC
  Filled 2017-03-18: qty 2

## 2017-03-18 NOTE — Progress Notes (Signed)
CSW spoke with patients son via phone on 03/17/17. Son stated patient will be discharging home and he will be living in the home with patient to assist her after discharge. Patient is still refusing SNF stating family will stay with her. CSW is signing off at this time.  CSW is available if patient has any more needs.   Marrianne Mood, MSW,  [employer] [phone]

## 2017-03-18 NOTE — Procedures (Signed)
Pt seen on HD.  Ap 190 Vp 240.  BFR 350.  Will try for 2L.  Tolerating HD well so far.

## 2017-03-18 NOTE — Progress Notes (Addendum)
Patient's daughter called for update. She said that she is concerned about patient not being willing to go to skilled nursing and patient's mental status. Daughter says that she will 'figure something out' and plans to alternate care for patient between herself and son. She was requesting home health services.  During this shift patient has failed to follow safety requests and continues to ambulate w/o assistance. She has had bowel movements on the floor and has set off the bed and chair alarm 4-5 times. She does not use the call bell. Floor pads in place. Bed alarm and chair alarm in use.  Pt now says she is agreeable to going to nursing facility and she realizes she 'may need some help getting around'.

## 2017-03-18 NOTE — Progress Notes (Addendum)
CSW spoke with patient about discharge plan. Patient stated that she still does not want to go to SNF but will because she knows it will be good for her. CSW went over bed offers with patient but patient stated she does not know any of the facilities listed. Patient gave CSW verbal permission to reach out to her friend Arline Asp to see what facility she would choose. CSW remains available for support an discharge needs.  CSW spoke to [place], Arline Asp stated that patient had been to [facility] in the past and had a good experience with them. Arline Asp stated that she thinks sending patient [facility] would be good for her since patient if familiar with staff.   Marrianne Mood, MSW,  [employer] [phone]

## 2017-03-18 NOTE — Progress Notes (Signed)
Subjective:  Doing well.  Denies any chest pain or shortness of breath.  Denies any palpitations.  Patient had wide complex tachycardia yesterday appears to be artifact.  Objective:  Vital Signs in the last 24 hours: Temp:  [98.2 F (36.8 C)-99.4 F (37.4 C)] 98.2 F (36.8 C) (04/11 1105) Pulse Rate:  [76-104] 102 (04/11 1105) Resp:  [17-18] 18 (04/11 1105) BP: (96-146)/(40-70) 102/40 (04/11 1105) SpO2:  [100 %] 100 % (04/11 1105) Weight:  [125 lb 10.6 oz (57 kg)-127 lb 13.9 oz (58 kg)] 125 lb 10.6 oz (57 kg) (04/11 1105)  Intake/Output from previous day: 04/10 0701 - 04/11 0700 In: 240 [P.O.:240] Out: -  Intake/Output from this shift: Total I/O In: -  Out: 1000 [Other:1000]  Physical Exam: Neck: no adenopathy, no carotid bruit, no JVD and supple, symmetrical, trachea midline Lungs: clear to auscultation bilaterally Heart: regular rate and rhythm, S1, S2 normal and soft systolic murmur noted Abdomen: soft, non-tender; bowel sounds normal; no masses,  no organomegaly Extremities: extremities normal, atraumatic, no cyanosis or edema  Lab Results:  Recent Labs  03/17/17 1725 03/18/17 0249  WBC 11.2* 11.6*  HGB 8.4* 8.0*  PLT 339 339    Recent Labs  03/17/17 1725 03/18/17 0651  NA 136 135  K 4.1 3.9  CL 96* 98*  CO2 24 24  GLUCOSE 113* 92  BUN 28* 34*  CREATININE 5.00* 5.94*   No results for input(s): TROPONINI in the last 72 hours.  Invalid input(s): CK, MB Hepatic Function Panel  Recent Labs  03/18/17 0651  ALBUMIN 2.6*   No results for input(s): CHOL in the last 72 hours. No results for input(s): PROTIME in the last 72 hours.  Imaging: Imaging results have been reviewed and No results found.  Cardiac Studies:  Assessment/Plan:  Right femoral neck fracture S/P right hip hemiarthroplasty Recent septal wall MI Hypertension ESRD on hemodialysis Paroxysmal atrial flutter GERD Anemia of chronic disease Dementia Plan Continue present  management. Keep hematocrit around 30.  In view of significant CAD. Molli Knock to discharge from cardiac point of view Follow-up with me in 2 weeks  LOS: 9 days    Rinaldo Cloud 03/18/2017, 1:31 PM

## 2017-03-18 NOTE — Progress Notes (Signed)
Documentation:   Spoke with Dr. Sharyn Lull regarding 35 beat run of Vtach. She is on the correct medications. Electrolytes are fine. No changes in management. Appreciate Dr. Annitta Jersey recommendations.   Palma Holter, MD PGY 2 Family Medicine

## 2017-03-18 NOTE — Progress Notes (Signed)
Family Medicine Teaching Service Daily Progress Note Intern Pager: [phone]  Patient name: Monica [name] Medical record number: [id number] Date of birth: Nov 14, 1932 Age: 83 y.o. Gender: female  Primary Care Provider: No PCP Per Patient Consultants: Cardiology, Ortho Code Status: Full   Chief Complaint: AMS and right hip pain  Assessment and Plan: Monica [name] is a 83 y.o. female presenting with AMS and right hip pain . PMH is significant for HTN, ESRD with HD MWF, Anxiety, Dementia  Recent STEMI: septal ant MI on EKG. Troponins peak 1.31. Had 35 beat run of Vtach yesterday --Appreciate cardiology recommendations regarding Vtach run -Telemetry -ASA81 qd and plavix  qd. Patient is not a candidate for chronic anticoagulation -Lopressor 25 mg BID, Atorvastatin 40 mg   Right Femoral Neck Fracture s/p R hip hemiarthroplasty 03/13/17: Likely from mechanical fall, unwitnessed. Now POD #6 with good pain control. Of note patient's primary caregiver is neighbor Monica [name] who is not currently able to take care of patient. Per patient and Monica [name], family is not involved in patient's life and has historically been very unreliable. - Per Orthopedics, WBAT RLE with rolling walker and up with PT/OT. In agreement with ASA81qd and Plavix  qd. Outpatient follow up in 2 weeks for post-op visit with Dr. Linna Caprice.  -Tylenol 650 mg q6h prn  - vitamin D low (13.8), continue calcitriol supplementation. - avoid PPI, continue H2 blocker instead  - PT/OT recommending SNF and that patient will not be able to return there without more independence. - SW consulted for SNF placement.  Concerns about safety at home with HHPT given patient has no reliable caretaker. Neighbor Ms Teola [name], who is patient's primary caretaker will speak with patient today.  ESRD: HD on MWF - Continue home phoslo - Nephrology following, HD next 4/9 - Daily renal function panel  Anemia  Baseline ~11. Multifactorial from ESRD and R hip  fracture s/p surgery.  Hgb stable at 8.0 - monitor CBC  Depression and Anxiety:  - Continue home Effexor and Xanax  Toothache Per speech evaluation, edema R upper maxilla with last molar sensitive to touch. - penicillin V - outpatient follow up with dentist  Acute toxic metabolic encephalopathy, resolved  Dementia:   - at baseline mental status  Stage 1 sacral ulcer with sebaceous cyst noted on attending physical exam this morning - barrier cream as needed  FEN/GI: renal diet, famotidine Prophylaxis: heparin gtt  Disposition: patient is medically ready for SNF   Subjective:  States hip pain is under good control. No concerns today. No CP or SOB.  Objective: Temp:  [98.7 F (37.1 C)-99.4 F (37.4 C)] 98.7 F (37.1 C) (04/11 0535) Pulse Rate:  [78-96] 78 (04/11 0535) Resp:  [18] 18 (04/11 0535) BP: (112-140)/(53-70) 140/53 (04/11 0535) SpO2:  [100 %] 100 % (04/11 0535) Physical Exam: General: NAD, resting comfortably in bed Cardiovascular: RRR, no m/r/g Respiratory: CTAB, no W/R/R Abdomen: nontender, nondistended Extremities: R hip with bandage in place, no edema Neuro: alert and oriented  Laboratory:  Recent Labs Lab 03/17/17 0243 03/17/17 1725 03/18/17 0249  WBC 8.7 11.2* 11.6*  HGB 8.4* 8.4* 8.0*  HCT 26.8* 27.0* 25.4*  PLT 263 339 339    Recent Labs Lab 03/16/17 0227 03/17/17 0243 03/17/17 1725  NA 133* 136 136  K 3.8 4.0 4.1  CL 99* 100* 96*  CO2 BUN 25* 13 28*  CREATININE 4.80* 3.53* 5.00*  CALCIUM 8.3* 8.9 9.4  GLUCOSE 109* 97 113*  Echo: EF 50-55%, severe basal septal hypertrophy, distal septal hypokinesis, moderate mitral regurg  Imaging/Diagnostic Tests: No results found.   Leland Her, DO,  03/18/2017, 7:10 AM PGY-1, [facility] FPTS Intern pager: [phone], text pages welcome

## 2017-03-19 MED ORDER — FAMOTIDINE 20 MG PO TABS: 20.0000 mg | ORAL_TABLET | Freq: Every day | ORAL | 0 refills | Status: DC

## 2017-03-19 MED ORDER — CLOPIDOGREL BISULFATE 75 MG PO TABS: 75.0000 mg | ORAL_TABLET | Freq: Every day | ORAL | 0 refills | Status: DC

## 2017-03-19 MED ORDER — ATORVASTATIN CALCIUM 40 MG PO TABS: 40.0000 mg | ORAL_TABLET | Freq: Every day | ORAL | 0 refills | Status: DC

## 2017-03-19 MED ORDER — CALCITRIOL 0.25 MCG PO CAPS: 0.7500 ug | ORAL_CAPSULE | ORAL | 0 refills | Status: DC

## 2017-03-19 MED ORDER — PENICILLIN V POTASSIUM 500 MG PO TABS: 500.0000 mg | ORAL_TABLET | Freq: Three times a day (TID) | ORAL | 0 refills | Status: DC

## 2017-03-19 MED ORDER — METOPROLOL TARTRATE 25 MG PO TABS
25.0000 mg | ORAL_TABLET | Freq: Two times a day (BID) | ORAL | 0 refills | Status: DC
Start: 2017-03-19 — End: 2017-04-15

## 2017-03-19 MED ORDER — NEPRO/CARBSTEADY PO LIQD: 237.0000 mL | Freq: Two times a day (BID) | ORAL | 0 refills | Status: DC

## 2017-03-19 NOTE — Progress Notes (Signed)
Subjective:  Doing well denies any chest pain or shortness of breath. Denies any palpitation. States her right hip feels much better.  Objective:  Vital Signs in the last 24 hours: Temp:  [98.2 F (36.8 C)-99.4 F (37.4 C)] 98.9 F (37.2 C) (04/12 0408) Pulse Rate:  [84-114] 84 (04/12 0408) Resp:  [18] 18 (04/12 0408) BP: (92-138)/(40-96) 110/73 (04/12 0408) SpO2:  [99 %-100 %] 99 % (04/12 0408) Weight:  [125 lb 10.6 oz (57 kg)] 125 lb 10.6 oz (57 kg) (04/11 1105)  Intake/Output from previous day: 04/11 0701 - 04/12 0700 In: 120 [P.O.:120] Out: 1000  Intake/Output from this shift: Total I/O In: 240 [P.O.:240] Out: -   Physical Exam: Neck: no adenopathy, no carotid bruit, no JVD and supple, symmetrical, trachea midline Lungs: clear to auscultation bilaterally Heart: regular rate and rhythm, S1, S2 normal and Soft systolic murmur noted Abdomen: soft, non-tender; bowel sounds normal; no masses,  no organomegaly Extremities: extremities normal, atraumatic, no cyanosis or edema  Lab Results:  Recent Labs  03/17/17 1725 03/18/17 0249  WBC 11.2* 11.6*  HGB 8.4* 8.0*  PLT 339 339    Recent Labs  03/17/17 1725 03/18/17 0651  NA 136 135  K 4.1 3.9  CL 96* 98*  CO2 24 24  GLUCOSE 113* 92  BUN 28* 34*  CREATININE 5.00* 5.94*   No results for input(s): TROPONINI in the last 72 hours.  Invalid input(s): CK, MB Hepatic Function Panel  Recent Labs  03/18/17 0651  ALBUMIN 2.6*   No results for input(s): CHOL in the last 72 hours. No results for input(s): PROTIME in the last 72 hours.  Imaging: Imaging results have been reviewed and No results found.  Cardiac Studies:  Assessment/Plan:  Right femoral neck fracture S/P right hip hemiarthroplasty Recent septal wall MI Hypertension ESRD on hemodialysis Paroxysmal atrial flutter GERD Anemia of chronic disease Dementia Plan Awaiting skilled nursing facility follow-up with me in 2 weeks I will sign off.  Please call if needed  LOS: 10 days    Rinaldo Cloud 03/19/2017, 10:02 AM

## 2017-03-19 NOTE — Progress Notes (Signed)
Pt is alert and oriented sitting on the side of the bed eating Report given to Nurse Adela Lank at [facility].

## 2017-03-19 NOTE — Progress Notes (Signed)
Clinical Social Worker facilitated patient discharge including contacting patient family and facility to confirm patient discharge plans.  Clinical information faxed to facility and family agreeable with plan.  CSW arranged ambulance transport via [facility] to [facility] .  RN to call [phone] (pt will go in room 307) report prior to discharge.  Clinical Social Worker will sign off for now as social work intervention is no longer needed. Please consult [place] again if new need arises.  Marrianne Mood, MSW, [employer] [phone]

## 2017-03-19 NOTE — Progress Notes (Signed)
Family Medicine Teaching Service Daily Progress Note Intern Pager: [phone]  Patient name: Monica [name] Medical record number: [id number] Date of birth: 02-06-1932 Age: 83 y.o. Gender: female  Primary Care Provider: No PCP Per Patient Consultants: Cardiology, Ortho Code Status: Full   Chief Complaint: AMS and right hip pain  Assessment and Plan: Monica [name] is a 83 y.o. female presenting with AMS and right hip pain . PMH is significant for HTN, ESRD with HD MWF, Anxiety, Dementia  Recent STEMI: septal ant MI on EKG. Troponins peak 1.31.  --Per cardiology, previous wide complex tachycardia appears to be artifact. Okay to discharge with Dr. Sharyn Lull follow up in 2 weeks  -Telemetry -ASA81 qd and plavix  qd. Patient is not a candidate for chronic anticoagulation -Lopressor 25 mg BID, Atorvastatin 40 mg   Right Femoral Neck Fracture s/p R hip hemiarthroplasty 03/13/17: Likely from mechanical fall, unwitnessed. Now POD #6 with good pain control. Of note patient's primary caregiver is neighbor [name] who is not currently able to take care of patient. Per patient and Ms. [name], family has historically been very unreliable. - Per Orthopedics, WBAT RLE with rolling walker and up with PT/OT. In agreement with ASA81qd and Plavix  qd. Outpatient follow up in 2 weeks for post-op visit with Dr. Linna Caprice.  -Tylenol 650 mg q6h prn  - vitamin D low (13.8), continue calcitriol supplementation. - avoid PPI, continue H2 blocker instead  - PT/OT recommending SNF and that patient will not be able to return there without more independence. - SW consulted for SNF placement.  Patient amenable to [facility].  ESRD: HD on MWF - Continue home phoslo - Nephrology following - Daily renal function panel  Anemia  Baseline ~11. Multifactorial from ESRD and R hip fracture s/p surgery.  Hgb stable at 8.0 - monitor CBC  Depression and Anxiety:  - Continue home Effexor and Xanax  Toothache Per speech  evaluation, edema R upper maxilla with last molar sensitive to touch. - penicillin V - outpatient follow up with dentist  Acute toxic metabolic encephalopathy, resolved  Dementia:   - at baseline mental status  Stage 1 sacral ulcer with sebaceous cyst  - barrier cream as needed  FEN/GI: renal diet, famotidine Prophylaxis: heparin subq  Disposition: patient is medically ready for SNF   Subjective:  No concerns today. No CP or SOB. Amenable to [facility]  Objective: Temp:  [98.2 F (36.8 C)-99.4 F (37.4 C)] 98.9 F (37.2 C) (04/12 0408) Pulse Rate:  [84-114] 84 (04/12 0408) Resp:  [18] 18 (04/12 0408) BP: (92-138)/(40-96) 110/73 (04/12 0408) SpO2:  [99 %-100 %] 99 % (04/12 0408) Weight:  [125 lb 10.6 oz (57 kg)] 125 lb 10.6 oz (57 kg) (04/11 1105) Physical Exam: General: NAD, resting comfortably in bed Cardiovascular: RRR, no m/r/g Respiratory: CTAB, no W/R/R Abdomen: nontender, nondistended Extremities: R hip with bandage in place, no edema Skin: sacrum covered with dressing c/d/i Neuro: alert and oriented  Laboratory:  Recent Labs Lab 03/17/17 0243 03/17/17 1725 03/18/17 0249  WBC 8.7 11.2* 11.6*  HGB 8.4* 8.4* 8.0*  HCT 26.8* 27.0* 25.4*  PLT 263 339 339    Recent Labs Lab 03/17/17 0243 03/17/17 1725 03/18/17 0651  NA 136 136 135  K 4.0 4.1 3.9  CL 100* 96* 98*  CO2 BUN 13 28* 34*  CREATININE 3.53* 5.00* 5.94*  CALCIUM 8.9 9.4 9.1  GLUCOSE 97 113* 92     Echo: EF 50-55%, severe basal  septal hypertrophy, distal septal hypokinesis, moderate mitral regurg  Imaging/Diagnostic Tests: No results found.   Leland Her, DO,  03/19/2017, 7:22 AM PGY-1, [facility] Family Medicine FPTS Intern pager: [phone], text pages welcome

## 2017-03-19 NOTE — Progress Notes (Signed)
Pt. Is alert and oriented forgetful, Stated that she was in pain this morning gave tylenol, vital stable gave pain meds, Dressing to Right Hip surgery  is lightly stained intact. Plan to be discharged and daughter Clearence Cheek was updated. Waiting on discharge order.

## 2017-03-19 NOTE — Progress Notes (Signed)
S: Understands the plan for SNF O:BP 110/73 (BP Location: Right Arm)   Pulse 84   Temp 98.9 F (37.2 C) (Oral)   Resp 18   Ht  (1.549 m)   Wt 57 kg (125 lb 10.6 oz)   SpO2 99%   BMI 23.74 kg/m   Intake/Output Summary (Last 24 hours) at 03/19/17 0647 Last data filed at 03/18/17 1458  Gross per 24 hour  Intake              120 ml  Output             1000 ml  Net             -880 ml   Weight change: -1 kg (-2 lb 3.3 oz) WUJ:WJXBJ and alert CVS: RRR Resp: Clear Abd:+ BS NTND Ext: No Edema   NEURO: CNI Ox3 Lt PC   . ALPRAZolam  0.5 mg Oral BID  . aspirin EC  81 mg Oral Daily  . atorvastatin  40 mg Oral q1800  . calcitRIOL  0.75 mcg Oral Q M,W,F-HD  . calcium acetate  667 mg Oral TID WC  . clopidogrel  75 mg Oral Daily  . darbepoetin (ARANESP) injection - DIALYSIS  100 mcg Intravenous Q Mon-HD  . famotidine  20 mg Oral Daily  . feeding supplement (NEPRO CARB STEADY)  237 mL Oral BID BM  . ferric gluconate (FERRLECIT/NULECIT) IV  125 mg Intravenous Q M,W,F-HD  . heparin subcutaneous  5,000 Units Subcutaneous Q8H  . metoprolol tartrate  25 mg Oral BID  . multivitamin  1 tablet Oral QHS  . penicillin v potassium  500 mg Oral Q8H  . sodium chloride flush  3 mL Intravenous Q12H  . venlafaxine XR  37.5 mg Oral QHS   No results found. BMET    Component Value Date/Time   NA 135 03/18/2017 0651   K 3.9 03/18/2017 0651   CL 98 (L) 03/18/2017 0651   CO2 24 03/18/2017 0651   GLUCOSE 92 03/18/2017 0651   BUN 34 (H) 03/18/2017 0651   CREATININE 5.94 (H) 03/18/2017 0651   CALCIUM 9.1 03/18/2017 0651   GFRNONAA 6 (L) 03/18/2017 0651   GFRAA 7 (L) 03/18/2017 0651   CBC    Component Value Date/Time   WBC 11.6 (H) 03/18/2017 0249   RBC 3.29 (L) 03/18/2017 0249   HGB 8.0 (L) 03/18/2017 0249   HCT 25.4 (L) 03/18/2017 0249   PLT 339 03/18/2017 0249   MCV 77.2 (L) 03/18/2017 0249   MCH 24.3 (L) 03/18/2017 0249   MCHC 31.5 03/18/2017 0249   RDW 16.4 (H) 03/18/2017 0249    LYMPHSABS 0.5 (L) 03/09/2017 1407   MONOABS 0.5 03/09/2017 1407   EOSABS 0.0 03/09/2017 1407   BASOSABS 0.0 03/09/2017 1407     Assessment:  1. Rt Hip Fx SP ORIF 2. Acute MI 3. ESRD MWF [facility] 4. Anemia on aranesp 5. Sec HPTH on calcitriol  Plan: 1. HD tomorrow if still here 2. Note plan for SNF Monica [name] T

## 2017-03-19 NOTE — Care Management Note (Signed)
Case Management Note Donn Pierini RN, BSN Unit 2W-Case Manager [phone]  Patient Details  Name: Monica [name] MRN: [id number] Date of Birth: [birth year]  Subjective/Objective:  Pt admitted s/p fall at home with hip fx- will need repair                   Action/Plan: PTA pt lived at home- awaiting cards clearance for OR- will await therapy evals post hip repair for d/c planning- most likely will need SNF.   Expected Discharge Date:           Expected Discharge Plan:  Skilled Nursing Facility  In-House Referral:  Clinical Social Work  Discharge planning Services  CM Consult  Post Acute Care Choice:  NA Choice offered to:  NA  DME Arranged:    DME Agency:     HH Arranged:    HH Agency:     Status of Service:  Completed, signed off  If discussed at [employer] of Stay Meetings, dates discussed:    Discharge Disposition: skilled facility   Additional Comments:  03/19/17- 1200- Videl Nobrega RN, CM- pt for d/c to SNF today- [facility]- CSW following for d/c placement needs  03/18/17- 1400- Sabena Winner RN, CM- pt s/p hip repair on 03/13/17- PT/OT recommendations for SNF post op- pt had been refusing SNF stating she wanted to return home- however in speaking to the MD pt does not have the needed support at home per the neighbor Arline Asp that had been helping to care for her. CSW has been consulted to assist in finding a SNF for pt- Arline Asp to come speak with pt today to explain to pt that rehab would be the best and safest d/c option for her.   Darrold Span, RN 03/19/2017, 12:12 PM

## 2017-03-20 ENCOUNTER — Non-Acute Institutional Stay: Payer: Medicare Other | Admitting: Family Medicine

## 2017-03-20 ENCOUNTER — Encounter: Payer: Self-pay | Admitting: Family Medicine

## 2017-03-20 DIAGNOSIS — Z602 Problems related to living alone: Secondary | ICD-10-CM | POA: Diagnosis not present

## 2017-03-20 DIAGNOSIS — R4189 Other symptoms and signs involving cognitive functions and awareness: Secondary | ICD-10-CM | POA: Diagnosis not present

## 2017-03-20 DIAGNOSIS — N186 End stage renal disease: Secondary | ICD-10-CM | POA: Diagnosis not present

## 2017-03-20 DIAGNOSIS — K0889 Other specified disorders of teeth and supporting structures: Secondary | ICD-10-CM | POA: Diagnosis not present

## 2017-03-20 DIAGNOSIS — I10 Essential (primary) hypertension: Secondary | ICD-10-CM

## 2017-03-20 DIAGNOSIS — Z7409 Other reduced mobility: Secondary | ICD-10-CM | POA: Diagnosis not present

## 2017-03-20 DIAGNOSIS — Z8711 Personal history of peptic ulcer disease: Secondary | ICD-10-CM

## 2017-03-20 DIAGNOSIS — S72001A Fracture of unspecified part of neck of right femur, initial encounter for closed fracture: Secondary | ICD-10-CM | POA: Diagnosis not present

## 2017-03-20 DIAGNOSIS — Z992 Dependence on renal dialysis: Secondary | ICD-10-CM

## 2017-03-20 DIAGNOSIS — I213 ST elevation (STEMI) myocardial infarction of unspecified site: Secondary | ICD-10-CM

## 2017-03-20 DIAGNOSIS — K862 Cyst of pancreas: Secondary | ICD-10-CM

## 2017-03-20 DIAGNOSIS — D631 Anemia in chronic kidney disease: Secondary | ICD-10-CM | POA: Diagnosis not present

## 2017-03-20 DIAGNOSIS — I480 Paroxysmal atrial fibrillation: Secondary | ICD-10-CM

## 2017-03-20 DIAGNOSIS — N189 Chronic kidney disease, unspecified: Secondary | ICD-10-CM

## 2017-03-20 DIAGNOSIS — L89152 Pressure ulcer of sacral region, stage 2: Secondary | ICD-10-CM | POA: Diagnosis not present

## 2017-03-20 DIAGNOSIS — Z8719 Personal history of other diseases of the digestive system: Secondary | ICD-10-CM

## 2017-03-20 DIAGNOSIS — Z96641 Presence of right artificial hip joint: Secondary | ICD-10-CM | POA: Diagnosis not present

## 2017-03-20 DIAGNOSIS — F1021 Alcohol dependence, in remission: Secondary | ICD-10-CM

## 2017-03-23 ENCOUNTER — Other Ambulatory Visit: Payer: Self-pay | Admitting: *Deleted

## 2017-03-23 ENCOUNTER — Encounter: Payer: Self-pay | Admitting: Family Medicine

## 2017-03-23 DIAGNOSIS — Z96641 Presence of right artificial hip joint: Secondary | ICD-10-CM | POA: Insufficient documentation

## 2017-03-23 DIAGNOSIS — Z602 Problems related to living alone: Secondary | ICD-10-CM | POA: Insufficient documentation

## 2017-03-23 DIAGNOSIS — F1021 Alcohol dependence, in remission: Secondary | ICD-10-CM | POA: Insufficient documentation

## 2017-03-23 DIAGNOSIS — K0889 Other specified disorders of teeth and supporting structures: Secondary | ICD-10-CM | POA: Insufficient documentation

## 2017-03-23 DIAGNOSIS — Z7409 Other reduced mobility: Secondary | ICD-10-CM | POA: Insufficient documentation

## 2017-03-23 DIAGNOSIS — Z8719 Personal history of other diseases of the digestive system: Secondary | ICD-10-CM | POA: Insufficient documentation

## 2017-03-23 DIAGNOSIS — Z8711 Personal history of peptic ulcer disease: Secondary | ICD-10-CM

## 2017-03-23 HISTORY — DX: Personal history of peptic ulcer disease: Z87.11

## 2017-03-23 HISTORY — DX: Alcohol dependence, in remission: F10.21

## 2017-03-23 NOTE — Assessment & Plan Note (Signed)
Established problem. Stable. Continue current therapy  

## 2017-03-23 NOTE — Assessment & Plan Note (Signed)
Established problem. Stable. Continue current therapy HD MWF

## 2017-03-23 NOTE — Assessment & Plan Note (Signed)
Slight increase in size over many years by CT./ Radiology recommended a CT with Contrast pancreatic protocol at some point in near future if ok with Nephrology

## 2017-03-23 NOTE — Assessment & Plan Note (Signed)
Established problem. Stable. 1 week s/p right hip arthroplasty by Dr Linna Caprice Pt participating in Rehab Minimal pain controlled with scheduled APAP and prn tramadol 25 mg  F/U Dr Linna Caprice 2 weeks after DC for surgical site recheck

## 2017-03-23 NOTE — Assessment & Plan Note (Signed)
New problem Failed MiniCog Problems with time and place in [place] this early admission Need full evaluation in near future as part of assessment of capacity to make informed medical decsions about residency

## 2017-03-23 NOTE — Assessment & Plan Note (Signed)
New problem Pt started complaining of uppr right molar pain during hospitalization Treated empirically with PCN VK for duration of hospitalziztion Will recomemdn follow up as outpatient If pt remains at [facility], will consult visiting dental service.

## 2017-03-23 NOTE — Telephone Encounter (Signed)
Fax received from pharmacy for Dr. McDiarmid's nursing home patient.  Jazmin Hartsell,CMA

## 2017-03-23 NOTE — Assessment & Plan Note (Signed)
Concern that patient's needs may be exceeding her community support. Pt reports being estranged from her three remaining living adult children.  Will need to assess cognitive and physical function compared to community support when time for dc form SNF planned.

## 2017-03-23 NOTE — Progress Notes (Signed)
This is a comprehensive admission note to [facility] performed on this date less than 30 days from date of admission. Included are preadmission medical/surgical history;reconciled medication list; family history; social history and comprehensive review of systems.  Corrections and additions to the records were documented . Comprehensive physical exam was also performed. Additionally a clinical summary was entered for each active diagnosis pertinent to this admission in the Problem List to enhance continuity of care.  [facility]  Visit  Primary Care Provider: Acquanetta Belling, MD Location of Care: [facility] Visit Information: Admission to [facility] Patient alone for interview Source(s) of information for visit: patient and past medical records  Chief Complaint:  Chief Complaint  Patient presents with  . Nursing Home Admission    Nursing Concerns: No major concerns. Perm Cath for HD present,   Behavioral Concerns: Dementia without BPSD.  Intermittent confusion to time and place present.  Nutrition Concerns: At risk weight fluctuation/volume fluctuation bc of ESRD  Wound Care Nurse Concerns: Type 2 sacral ulcer; post op dressing to right hip  PT Concerns and Goals: Concerns Multiple functional loses in basic ADLs;  improved activity tolerance, improved balance, ability to compensate for deficits and functional use of  RIGHT upper, RIGHT lower, LEFT upper and LEFT lower extremity;    OT Concerns and Goals: Patient will increase UE function to allow independence in all self-care activities. ;  Physical Restraint: No    If SNF admission, patient's goal for the rehabilitation admission:  Goals identified by the patient:;  return home ; Independence    Family Goals: estranged from family   If SNF admission, discharge disposition goals:  Other: Ability of community support to meet pt need to be determined    HISTORY OF  PRESENT ILLNESS:   HPI Principle Diagnosis requiring hospitalization: Displaced Right femoral neck fracture (traumatic) and NSTEMI of anteroseptal myocardium  Brief Hospital course summary:  Admission [facility] 03/09/17 thru 03/19/17 for traumatic displaced right hip fracture requiring arthroplasty in setting of high risk periop complications from recent STEMI. Patient with altered mental status at presentation that gradually normalized to baseline. STEMI anterior/septal with elevated troponin 0.86. Dr Sharyn Lull treated with IV Heparin. Pt did well postop.  PT evaluation recommended SNF.  Dr Sharyn Lull started patient on DAPT with Aspirin and Clopidogrel  Cardiac evaluation recommended for future.  Hemodialysis for ESRD was continued during hospitalziation by Nephrology service  Follow up instructions from patient's hospital healthcare providers:  1. Given STEMI, patient will need cardiac evaluation in the future.  2. Daily function and pain control s/p hip surgery. She needs to follow up with orthopedics Dr. Linna Caprice in 2 weeks. 3. Patient discharged on ASA  qd and Plavix  qd due to fall risk and risk of bleeding. She is not a candidate for anicoagulation. Needs to follow up with Cardiology Dr. Sharyn Lull in 2 weeks. 4. Patient needs to see dentist for her toothache  5. Please coordinate with dialysis to obtain CT abd/pelvis with contrast to further evaluate pancreatic cyst. 6. Please evaluate patient's baseline dementia, may benefit [facility]. 7. Consider weaning patient off of xanax given her propensity for falls. ----------------------------------------------------------------------------------------------------------------------  ---------------------------------------------------------------------------------------------------------------------- Follow up appointments with specialists:   Pending: Dr Rinaldo Cloud (Card) in 2 weeks after DC for cardiac evaluation after STEMI  Pending: Dr  Linna Caprice (ortho) in 2 weeks after DC for surgical site recheck  Pending: Dentist for toothache (not scheduled ---------------------------------------------------------------------------------------------------------------------- New medications started during hospitalization: Aspirin and Plavix,  metoprolol  Chronic medications stopped during hospitalization: none Patient's Medication List was updated in the EMR: yes --------------------------------------------------------------------------------------------------------------------- Durable Medical Equipment: Walker Rolling --------------------------------------------------------------------------------------------------------------------- ADLs Independent Needs Assistance Dependent  Bathing   x  Dressing   x  Ambulation  x   Toileting   x  Eating x        Outpatient Encounter Prescriptions as of 03/20/2017  Medication Sig  . acetaminophen (TYLENOL) 325 MG tablet Take 2 tablets (650 mg total) by mouth 3 (three) times daily.  Marland Kitchen ALPRAZolam (XANAX) 0.5 MG tablet Take 0.5 tablets (0.25 mg total) by mouth 2 (two) times daily.  Marland Kitchen aspirin EC 81 MG EC tablet Take 1 tablet (81 mg total) by mouth daily.  Marland Kitchen atorvastatin (LIPITOR) 40 MG tablet Take 1 tablet (40 mg total) by mouth daily at 6 PM.  . calcitRIOL (ROCALTROL) 0.25 MCG capsule Take 3 capsules (0.75 mcg total) by mouth every Monday, Wednesday, and Friday with hemodialysis.  Marland Kitchen calcium acetate (PHOSLO) 667 MG tablet Take 667 mg by mouth See admin instructions. Take 2 tablets (1334 mg) by mouth 3 times daily with meals and with snacks  . clopidogrel (PLAVIX) 75 MG tablet Take 1 tablet (75 mg total) by mouth daily.  . famotidine (PEPCID) 20 MG tablet Take 1 tablet (20 mg total) by mouth daily.  . metoprolol tartrate (LOPRESSOR) 25 MG tablet Take 1 tablet (25 mg total) by mouth 2 (two) times daily.  . nitroGLYCERIN (NITROSTAT) 0.4 MG SL tablet Place 0.4 mg under the tongue every 5 (five)  minutes as needed for chest pain.  . Nutritional Supplements (FEEDING SUPPLEMENT, NEPRO CARB STEADY,) LIQD Take 237 mLs by mouth 2 (two) times daily between meals.  . Tetrahydrozoline HCl (VISINE OP) Place 2 drops into both eyes daily as needed (dry eyes).  . venlafaxine XR (EFFEXOR-XR) 37.5 MG 24 hr capsule Take 37.5 mg by mouth at bedtime.  . [DISCONTINUED] acetaminophen (TYLENOL) 500 MG tablet Take 1,000 mg by mouth every 6 (six) hours as needed for mild pain.  . [DISCONTINUED] ALPRAZolam (XANAX) 0.5 MG tablet Take 1 tablet (0.5 mg total) by mouth 2 (two) times daily.  . [DISCONTINUED] penicillin v potassium (VEETID) 500 MG tablet Take 1 tablet (500 mg total) by mouth every 8 (eight) hours.   No facility-administered encounter medications on file as of 03/20/2017.    Allergies  Allergen Reactions  . Aspirin Other (See Comments)    "makes my stomach hurt"  . Codeine Other (See Comments)    "i get irritated"   History Patient Active Problem List   Diagnosis Date Noted  . History of total right hip arthroplasty 03/23/2017    Priority: High  . Impaired mobility and ADLs 03/23/2017    Priority: High  . ST elevation myocardial infarction (STEMI) [facility])     Priority: High  . Closed displaced fracture of right femoral neck (HCC)     Priority: High  . Steal syndrome as complication of dialysis access [facility]) 01/31/2017    Priority: High  . ESRD on dialysis [facility]) 01/29/2017    Priority: High  . Dementia 10/23/2016    Priority: High  . PAF (paroxysmal atrial fibrillation) (HCC) 12/29/2013    Priority: High  . Lives alone with some help available 03/23/2017    Priority: Medium  . Sacral decubitus ulcer, stage II     Priority: Medium  . Anemia of renal disease 03/13/2017    Priority: Medium  . Malnutrition of moderate degree 03/11/2017  Priority: Medium  . Pancreatic cyst     Priority: Medium  . HTN (hypertension) 12/29/2013    Priority: Medium  . Tooth pain 03/23/2017  . History  of alcohol dependence (HCC) 03/23/2017  . History of gastric ulcer with bleeding 03/23/2017   Past Medical History:  Diagnosis Date  . Abscess of buttock 12/29/2013  . Acute blood loss anemia 03/13/2017  . Acute delirium   . Anemia of renal disease 03/13/2017  . Anemia, chronic renal failure, stage 5 (HCC) 10/23/2016  . Anxiety   . Arthritis    "all over" (01/29/2017)  . Atrial flutter (HCC)   . Chronic back pain    "all my back" (01/29/2017)  . CKD (chronic kidney disease) stage 5, GFR less than 15 ml/min (HCC) 12/29/2013  . Constipation   . Dementia   . Depression   . Displaced fracture of right femoral neck (HCC)   . Elevated troponin   . Encounter for nasogastric (NG) tube placement   . Escherichia coli urinary tract infection 12/29/2013  . Escherichia coli urinary tract infection 12/29/2013  . ESRD (end stage renal disease) on dialysis [facility])    "MWF; Rudene Anda" (01/29/2017)  . ETOHism (HCC)    Quit in the 1980s.  . Gastric ulcer 12/2013.   Prepyloric ulcer. On follow-up EGD in 02/2014 this was 90% healed  . Gastric ulcer with hemorrhage 12/29/2013  . GERD (gastroesophageal reflux disease)   . Headache    as a young woman  . Hemorrhoids   . History of alcohol dependence (HCC) 03/23/2017  . History of kidney stones   . Hypertension   . Left sided chest pain 10/28/2016   See 10/28/16 she describes pleuritic-type pain on the left since a fall approximately 3 weeks ago Apparently she slipped in her tub without cardiac or neurologic prodrome  . Pancreatic cyst 09/2010.   At uncinate process.  Serial CT imaging favors benign process  . Protein-calorie malnutrition, severe 10/25/2016  . SBO (small bowel obstruction) (HCC)   . Unintentional weight loss 10/23/2016  . Upper GI bleed 12/28/2013   Past Surgical History:  Procedure Laterality Date  . ABDOMINAL HYSTERECTOMY    . ANTERIOR APPROACH HEMI HIP ARTHROPLASTY Right 03/13/2017   Procedure: RIGHT TOTAL HIP ARTHROPLASTY;  Surgeon:  Samson Frederic, MD;  Location: [facility] OR;  Service: Orthopedics;  Laterality: Right;  . ARTERIOVENOUS GRAFT PLACEMENT Left 01/29/2017   INSERTION OF ARTERIOVENOUS (AV) GORE-TEX GRAFT  STRETCHED IN LEFT FORE ARM   . AV FISTULA PLACEMENT Left 03/20/2015   Procedure: ARTERIOVENOUS (AV) FISTULA CREATION;  Surgeon: Chuck Hint, MD;  Location: [facility] OR;  Service: Vascular;  Laterality: Left;  . AV FISTULA PLACEMENT Left 01/29/2017   Procedure: INSERTION OF ARTERIOVENOUS (AV) GORE-TEX GRAFT  STRETCHED IN LEFT FORE ARM;  Surgeon: Chuck Hint, MD;  Location: [facility] OR;  Service: Vascular;  Laterality: Left;  . AVGG REMOVAL Left 01/31/2017   Procedure: REMOVAL OF ARTERIOVENOUS GORETEX GRAFT (AVGG) WITH Vein patching.;  Surgeon: Fransisco Hertz, MD;  Location: [facility] OR;  Service: Vascular;  Laterality: Left;  . CATARACT EXTRACTION Right   . COLONOSCOPY  2010   Per Dr. Loreta Ave  . ESOPHAGOGASTRODUODENOSCOPY N/A 12/29/2013   Procedure: ESOPHAGOGASTRODUODENOSCOPY (EGD);  Surgeon: Hart Carwin, MD;  Location: [facility] ENDOSCOPY;  Service: Endoscopy;  Laterality: N/A;  . EXPLORATORY LAPAROTOMY  1980   w/LOA  . FLEXIBLE SIGMOIDOSCOPY  08/2011   Per Dr. Randa Evens. Normal study. Random biopsies positive for trace melanosis coli.  Marland Kitchen  FOOT SURGERY Bilateral    "surgery for flat feet"  . INSERTION OF DIALYSIS CATHETER Right 11/25/2016   Procedure: INSERTION OF Right Internal jugular DIALYSIS CATHETER;  Surgeon: Fransisco Hertz, MD;  Location: [facility] OR;  Service: Vascular;  Laterality: Right;  . SHOULDER ARTHROSCOPY W/ ROTATOR CUFF REPAIR Right 08/2003   Hattie Perch 01/29/2017  . TIBIA FRACTURE SURGERY Right    "broke between my knee and my ankle"  . TOTAL ABDOMINAL HYSTERECTOMY W/ BILATERAL SALPINGOOPHORECTOMY  1980  . TUBAL LIGATION     Family History  Problem Relation Age of Onset  . Alcohol abuse Mother     reports that she quit smoking about 31 years ago. Her smoking use included Cigarettes. She has a 16.00 pack-year smoking  history. She has never used smokeless tobacco. She reports that she drinks alcohol. She reports that she does not use drugs.    Falls in the past six months:   yes  Diet:  Renal diet, 1500 ml/d restriction fluid Feeding Tube: No Nourishment: adequate   Hydration Status: well hydrated  Nutritional Supplements:  Medpass: no Magic Cup:no  Prostat:yes BID Juven:no   Communication Barriers: cognitive  Review of Systems  Patient has ability to communicate answers to ROS: yes See HPI General: Denies fevers, chills, progressive fatigue, weight gain.  Eyes: Denies pain, blurred vision  Ears/Nose/Throat: Denies ear pain, throat pain, rhinorrhea, nasal congestion.  Cardiovascular: (+)dyspnea on exertion Denies chest pains, palpitations, , orthopnea, peripheral edema.  Respiratory: Denies cough, sputum, dyspnea  Gastrointestinal: Denies abdominal pain, bloating, constipation, diarrhea.  Genitourinary: (+) urinary incontinence. Denies dysuria, urinary frequency, Musculoskeletal:Bilateral ankle edema Denies joint pain, swelling, weakness.  Skin: Denies skin rash or ulcers. Neurologic: Denies transient paralysis, weakness, paresthesias, headache.  Psychiatric: Denies anxiety, paranoia. Endocrine: Denies weight loss    Geriatric Syndromes: Constipation no ,   Incontinence yes  Dizziness no   Syncope no   Skin problems yes   Visual Impairment no   Hearing impairment no  Eating impairment no  Impaired Memory or Cognition yes   Behavioral problems no   Sleep problems no   Weight loss no    Pain:  Pain Location: right hip and sacrum Pain Rating: She is currently in no pain.  Pain Therapies: Tylenol scheduled and tramadol 25 mg prn Pain Response to Therapies: good Bowel Movement Difficulty: no  Dyspnea: Dyspnea Rating: 1 = mild, noticable to patient only Dyspnea Goal: mild Dyspnea Therapies: none   Mood: angry, irritable, sad and changes with topic   Psychotropic  Medication Use:  Sedative-hypnotics / Anxiolytics: Yes Alprazolam (chronic Rx)  Antipsychotics: No Antidepressants: Yes Venlafaxine    PHYSICAL EXAM:. Wt Readings from Last 3 Encounters:  03/21/17 128 lb 9.6 oz (58.3 kg)  03/18/17 125 lb 10.6 oz (57 kg)  02/20/17 133 lb (60.3 kg)   Temp Readings from Last 3 Encounters:  03/21/17 97.5 F (36.4 C)  03/19/17 98.9 F (37.2 C) (Oral)  02/20/17 98.3 F (36.8 C) (Oral)   BP Readings from Last 3 Encounters:  03/21/17 122/70  03/19/17 110/73  02/20/17 140/76   Pulse Readings from Last 3 Encounters:  03/21/17 76  03/19/17 84  02/20/17 74    General: alert, cooperative, well nourished, pleasant, clean, groomed HEENT:   EACs not occluded, Edentulous Neck:  Supple, No JVD, no lymphadenopathy CV:  irr irr, distant heart sounde, (2+) ankle swellingbilaterally RESP: No resp distress or accessory muscle use.  Clear to ausc bilat. No wheezing, no rales, no rhonchi.  ABD:  Soft, Non-tender, non-distended, +bowel sounds EXT: Warm and well perfused    Gait:  Not tested Skin:Ulcer Location: sacral, stage two Neurologic: Muscle Tone within normal limits; Motor Strength: weakness of UE 5/5 Sensation: WFL by patient report; Cerebellar: no tremors noted;  Fair Psych:  Orientation oriented to person; Judgment Poor, Insight Fair    Memory Failed MiniCog (2/3 objects with failed Clock draw ; Attention Normal;  Mood variable from anger to tearful; Speech normal; Language none ; Thought concrete    No flowsheet data found. No flowsheet data found.  MiniCog: Failed MMSE or MoCa:not preformed Years of Education: < 8   Renal/Electrolytes (last) BMP Latest Ref Rng & Units 03/18/2017 03/17/2017 03/17/2017  Glucose 65 - 99 mg/dL 92 562(Z) 97  BUN 6 - 20 mg/dL 30(Q) 65(H) 13  Creatinine 0.44 - 1.00 mg/dL 8.46(N) 6.29(B) 2.84(X)  Sodium 135 - 145 mmol/L 135 136 136  Potassium 3.5 - 5.1 mmol/L 3.9 4.1 4.0  Chloride 101 - 111 mmol/L 98(L) 96(L)  100(L)  CO2 22 - 32 mmol/L Calcium 8.9 - 10.3 mg/dL 9.1 9.4 8.9  , @ Lab Results  Component Value Date   CALCIUM 9.1 03/18/2017   PHOS 3.3 03/18/2017   LFTs (last) Lab Results  Component Value Date   ALT 15 03/09/2017   AST 21 03/09/2017   ALKPHOS 67 03/09/2017   BILITOT 0.7 03/09/2017     Lipase (last)    Component Value Date/Time   LIPASE 85 (H) 08/13/2016 1459   Amylase (last) No results found for: AMYLASE CBC (last) CBC Latest Ref Rng & Units 03/18/2017 03/17/2017 03/17/2017  WBC 4.0 - 10.5 K/uL 11.6(H) 11.2(H) 8.7  Hemoglobin 12.0 - 15.0 g/dL 8.0(L) 8.4(L) 8.4(L)  Hematocrit 36.0 - 46.0 % 25.4(L) 27.0(L) 26.8(L)  Platelets 150 - 400 K/uL 339 339 263   Lipids (last) Lab Results  Component Value Date   CHOL 167 01/27/2009   HDL 30 (L) 01/27/2009   LDLCALC 86 01/27/2009   TRIG 256 (H) 01/27/2009   CHOLHDL 5.6 01/27/2009   Cardiac Enzymes  Lab Results  Component Value Date   CKTOTAL 170 03/09/2017   TROPONINI 1.31 (HH) 03/10/2017   BNP (last 3 results)  No results for input(s): BNP in the last 8760 hours. ProBNP (last 3 results)  No results for input(s): PROBNP in the last 8760 hours. CBG (last 3)  No results for input(s): GLUCAP in the last 72 hours. A1c (last) Lab Results  Component Value Date   HGBA1C 5.1 03/10/2017   Imaging  Head and Cervical CT 03/09/17 IMPRESSION: 1. Chronic volume loss, unchanged, without acute intracranial abnormality. 2. No acute fracture or static subluxation of the cervical spine. DG Hip Unilat 03/09/17 Severely displaced proximal right femoral neck fracture.  CT Abd/Pelvis 08/13/16 Indeterminate cystic pancreatic lesion (2.3 x 2.2 cm) in the uncinate process is stable since 03/15/2015, although increased since 2012. No main pancreatic duct dilation. If not previously performed, recommend further characterization of this lesion with pancreas protocol MRI abdomen without and with IV contrast on a  short term outpatient basis as clinically warranted.   Echocardiogram (TTE) 0/2108: EF 50-55% with severe basal septal hypertrophy and distal septal hypokinesis.  Health Maintenance due or soon due: Prevnar   Assessment and Plan:   See Problem List for individual problem's assessment and plans.   Future labs/tests needed: CT abdomin /pelvis with contrast to further evaluate pancreatic cyst   Time spent > (Admit: 45 min -  16109) (Subsequent: 10 min - 60454; 15 min - 09811; 25 min - 91478; 35 min - 29562) ;> 50% of time with patient was spent reviewing records, labs, tests and studies, counseling, discussion with , discussion with nursing facility staff    Advanced Directives (MOST form, Living Will, HCPOA): none Code Status: Full Code    Follow Up:  Next 30 days unless acute issues arise.

## 2017-03-23 NOTE — Assessment & Plan Note (Signed)
Established problem Controlled On Clopidogrel and Aspirin for NSTEMI Rate controlled on metoprolol Hisotry of Gastric Ulcer bleed in 2015

## 2017-03-23 NOTE — Assessment & Plan Note (Signed)
At risk bleeding recurrence on Asprin and Plavix Start Protonix for prophylaxis while on antiplatelet therapies.

## 2017-03-23 NOTE — Assessment & Plan Note (Signed)
New problem Foam island dressing and consult with WOC nurse at [facility]

## 2017-03-23 NOTE — Assessment & Plan Note (Addendum)
Adequate blood pressure control. Tolerating medication without significant adverse effects.  Plan to continue current blood pressure regiment.   

## 2017-03-23 NOTE — Assessment & Plan Note (Addendum)
STEMI event occurred around 14 days ago or more. On Aspirn/Clopidogrel On Protonix for gastric protection given history of GU with bleed 2015 requiring hospitalization On metoprolol EF 50 to 55% with basal HK On Atorvastatin 40 mg daily  F/U with Dr Judie Petit. Harwani 2 weeks after DC from hospitalization for further cardiac W/U

## 2017-03-23 NOTE — Assessment & Plan Note (Signed)
PT and OT consultation for evaluation and treatment at [facility]

## 2017-03-24 ENCOUNTER — Encounter: Payer: Self-pay | Admitting: Internal Medicine

## 2017-03-24 NOTE — Patient Instructions (Signed)
Opened in error

## 2017-03-24 NOTE — Progress Notes (Signed)
Opened in error

## 2017-03-25 ENCOUNTER — Other Ambulatory Visit: Payer: Self-pay | Admitting: Family Medicine

## 2017-03-25 DIAGNOSIS — Z96641 Presence of right artificial hip joint: Secondary | ICD-10-CM

## 2017-03-25 DIAGNOSIS — F411 Generalized anxiety disorder: Secondary | ICD-10-CM | POA: Insufficient documentation

## 2017-03-25 DIAGNOSIS — F1320 Sedative, hypnotic or anxiolytic dependence, uncomplicated: Secondary | ICD-10-CM | POA: Insufficient documentation

## 2017-03-25 DIAGNOSIS — K0889 Other specified disorders of teeth and supporting structures: Secondary | ICD-10-CM

## 2017-03-25 DIAGNOSIS — S72001A Fracture of unspecified part of neck of right femur, initial encounter for closed fracture: Secondary | ICD-10-CM

## 2017-03-25 MED ORDER — TRAMADOL HCL 50 MG PO TABS: 25.0000 mg | ORAL_TABLET | Freq: Three times a day (TID) | ORAL | 1 refills | Status: DC | PRN

## 2017-03-25 MED ORDER — ALPRAZOLAM 0.5 MG PO TABS: 0.2500 mg | ORAL_TABLET | Freq: Two times a day (BID) | ORAL | 1 refills | Status: DC

## 2017-03-25 NOTE — Progress Notes (Signed)
Fax for Alprazolam 0.5 mg tablet, #30 w/ RF one  And for Tramadol 50 mg tablet, #45 with RF one.

## 2017-03-26 ENCOUNTER — Encounter: Payer: Self-pay | Admitting: *Deleted

## 2017-03-26 LAB — VITAMIN D 25 HYDROXY (VIT D DEFICIENCY, FRACTURES): Vit D, 25-Hydroxy: 23.24

## 2017-03-26 LAB — CBC AND DIFFERENTIAL
HCT: 33 % — AB (ref 36–46)
Hemoglobin: 9.7 g/dL — AB (ref 12.0–16.0)
PLATELETS: 325 10*3/uL (ref 150–399)
WBC: 8 10^3/mL

## 2017-04-02 ENCOUNTER — Encounter: Payer: Self-pay | Admitting: *Deleted

## 2017-04-02 LAB — CBC AND DIFFERENTIAL
HEMATOCRIT: 35 % — AB (ref 36–46)
HEMOGLOBIN: 10.3 g/dL — AB (ref 12.0–16.0)
PLATELETS: 277 10*3/uL (ref 150–399)
WBC: 6 10^3/mL

## 2017-04-07 ENCOUNTER — Ambulatory Visit: Payer: Medicare Other | Admitting: Sports Medicine

## 2017-04-15 ENCOUNTER — Non-Acute Institutional Stay: Payer: Medicare Other | Admitting: Family Medicine

## 2017-04-15 DIAGNOSIS — I213 ST elevation (STEMI) myocardial infarction of unspecified site: Secondary | ICD-10-CM | POA: Diagnosis not present

## 2017-04-15 DIAGNOSIS — Z7409 Other reduced mobility: Secondary | ICD-10-CM

## 2017-04-15 DIAGNOSIS — I480 Paroxysmal atrial fibrillation: Secondary | ICD-10-CM

## 2017-04-15 DIAGNOSIS — Z8719 Personal history of other diseases of the digestive system: Secondary | ICD-10-CM

## 2017-04-15 DIAGNOSIS — E440 Moderate protein-calorie malnutrition: Secondary | ICD-10-CM

## 2017-04-15 DIAGNOSIS — K0889 Other specified disorders of teeth and supporting structures: Secondary | ICD-10-CM

## 2017-04-15 DIAGNOSIS — S72001A Fracture of unspecified part of neck of right femur, initial encounter for closed fracture: Secondary | ICD-10-CM

## 2017-04-15 DIAGNOSIS — Z789 Other specified health status: Secondary | ICD-10-CM | POA: Insufficient documentation

## 2017-04-15 DIAGNOSIS — Z8711 Personal history of peptic ulcer disease: Secondary | ICD-10-CM

## 2017-04-15 DIAGNOSIS — Z992 Dependence on renal dialysis: Secondary | ICD-10-CM

## 2017-04-15 DIAGNOSIS — K862 Cyst of pancreas: Secondary | ICD-10-CM

## 2017-04-15 DIAGNOSIS — Z96641 Presence of right artificial hip joint: Secondary | ICD-10-CM

## 2017-04-15 DIAGNOSIS — I10 Essential (primary) hypertension: Secondary | ICD-10-CM | POA: Diagnosis not present

## 2017-04-15 DIAGNOSIS — N186 End stage renal disease: Secondary | ICD-10-CM

## 2017-04-15 DIAGNOSIS — L89152 Pressure ulcer of sacral region, stage 2: Secondary | ICD-10-CM

## 2017-04-15 DIAGNOSIS — Z593 Problems related to living in residential institution: Secondary | ICD-10-CM | POA: Insufficient documentation

## 2017-04-15 MED ORDER — METOPROLOL TARTRATE 25 MG PO TABS: 25.0000 mg | ORAL_TABLET | Freq: Two times a day (BID) | ORAL | 0 refills | Status: DC

## 2017-04-15 MED ORDER — VITAMIN D3 25 MCG (1000 UNIT) PO TABS: 1000.0000 [IU] | ORAL_TABLET | Freq: Every day | ORAL | 0 refills | Status: DC

## 2017-04-15 MED ORDER — VENLAFAXINE HCL ER 75 MG PO CP24: 75.0000 mg | ORAL_CAPSULE | Freq: Every day | ORAL | 0 refills | Status: DC

## 2017-04-15 MED ORDER — PANTOPRAZOLE SODIUM 40 MG PO TBEC: 40.0000 mg | DELAYED_RELEASE_TABLET | Freq: Every day | ORAL | 0 refills | Status: DC

## 2017-04-15 MED ORDER — NEPRO/CARBSTEADY PO LIQD: 237.0000 mL | Freq: Two times a day (BID) | ORAL | 0 refills | Status: DC

## 2017-04-15 MED ORDER — NITROGLYCERIN 0.4 MG SL SUBL: 0.4000 mg | SUBLINGUAL_TABLET | SUBLINGUAL | 0 refills | Status: DC | PRN

## 2017-04-15 MED ORDER — CALCITRIOL 0.25 MCG PO CAPS: 0.7500 ug | ORAL_CAPSULE | ORAL | 0 refills | Status: DC

## 2017-04-15 MED ORDER — ACETAMINOPHEN 325 MG PO TABS: 650.0000 mg | ORAL_TABLET | Freq: Three times a day (TID) | ORAL | 0 refills | Status: DC

## 2017-04-15 MED ORDER — ASPIRIN 81 MG PO TBEC: 81.0000 mg | DELAYED_RELEASE_TABLET | Freq: Every day | ORAL | 0 refills | Status: DC

## 2017-04-15 MED ORDER — TRAMADOL HCL 50 MG PO TABS: 25.0000 mg | ORAL_TABLET | Freq: Three times a day (TID) | ORAL | 0 refills | Status: DC | PRN

## 2017-04-15 MED ORDER — CLOPIDOGREL BISULFATE 75 MG PO TABS: 75.0000 mg | ORAL_TABLET | Freq: Every day | ORAL | 0 refills | Status: DC

## 2017-04-15 MED ORDER — CALCIUM ACETATE (PHOS BINDER) 667 MG PO TABS: 1334.0000 mg | ORAL_TABLET | Freq: Three times a day (TID) | ORAL | 0 refills | Status: DC

## 2017-04-15 MED ORDER — ATORVASTATIN CALCIUM 40 MG PO TABS: 40.0000 mg | ORAL_TABLET | Freq: Every day | ORAL | 0 refills | Status: DC

## 2017-04-15 NOTE — Progress Notes (Signed)
Family [facility] Discharge Summary  Patient name: Monica [name] Medical record number: [id number] Date of birth: 08/04/1932 Age: 83 y.o. Gender: female Date of Admission: 03/19/17  Date of Discharge: 04/16/17 Admitting Physician: Acquanetta Belling, MD Primary Care Provider: Leland Her, DO Consultants: Gaylord Shih ([place]), Cardiology [facility]) Location of care: [facility] Code Status: Full Code  Indication for SNF: R hip fracture s/p arthroplasty, STEMI  Discharge Diagnoses/Problem List:  ESRD R hip fracture s/p arthroplasty h/o STEMI HTN Sacral PU stage II (present on admission, now resolved) PAF h/o GI bleed 2/2 PUD Pancreatic cyst  Disposition: home, daughter to stay with her in her home temporarily  Discharge Condition: stable  Discharge Exam:  Vitals:   04/15/17 1012  BP: 118/68  Pulse: 80  Resp: 18  Temp: (!) 96.8 F (36 C)   Gen: Standing at bedside in NAD HEENT: NCAT, MMM, anicteric sclera CV: RRR, no m/r/g Pulm: CTAB, normal WOB Abd: Soft, NTND Ext: WWP, no edema MSK: Incision over R hip healing well, gait is intact, moves all extremities Neuro: Alert and interactive, answers questions appropriately  Brief Hospital Course:  Patient was admitted to SNF for rehabilitation after R hip Femoral neck fracture status post right hip hemiarthroplasty and STEMI.  She was previously admitted to hospital 03/09/17 to 03/19/17.  Patient had been admitted to the hospital for altered mental status after missed dialysis session. She was found to be hypertensive with ST elevation in anterior septal leads and troponin elevated to 0.86 on admission. She also had an unwitnessed likely mechanical fall and was found to have a severely displaced right femoral neck fracture on x-ray. Troponins were trended and peaked at 1.31. Her mental status improved back to her baseline after hemodialysis. Patient underwent right hip hemiarthroplasty after  cardiac clearance given the high risk surgery. Her STEMI was medically managed on dual antiplatelet therapy.  During her stay at nursing facility, she worked with physical therapy, occupational therapy, speech therapy. She remained independent with speech, following commands, swallowing. She progressed from moderate to mild impairment of recall and improved from only being oriented to person to being oriented to person and place with her work with speech therapy. All working with occupational therapy, patient improved from set up for cleanup assistance to independence with eating and oral hygiene. She also improved from dependence to independence in toileting hygiene, grooming, dressing. All working with physical therapy, patient progressed with all goals since start of care and progress to modified independent transfers, independence with bed no mobility, improved lower extremity strength and range of motion in the right hip and decreased reports of hip pain with ambulation. She was able to walk household distances with her 4 wheeled walker with modified independence.  Xanax was down titrated and discontinued prior to discharge from SNF.  This could've worsened her risk of falling. She is followed up with orthopedics and cardiology and continues to progress well. Cardiology recommended further follow-up with possible further cardiac evaluation in 3 months. She will see orthopedics again in one month. She currently remains on dual antiplatelet therapy. Due to her fall risk and risk of bleeding, she is not a candidate for anticoagulation. She continued to get hemodialysis on her regular Monday, Wednesday, Friday schedule throughout rehabilitation stay. She is able to weight-bear as tolerated on her right leg.  Issues for Follow Up:  - Consider further titration of Effexor for management of depression - needs to f/u with Ortho and Cardiology as planned -  Consider abdominal CT Pancreatic Protocol to evaluate  slowly enlarging pancreatic cyst - as requires contrast, would check with Nephrology - Needs dental f/u for dental pain - needs ongoing HH therapy services - PT, OT, SW, RN (med management) - Consider MOCA testing to assess baseline cognitive impairment - recheck Hgb while on DAPT with h/o PUD with GI bleed   Significant Procedures, Significant Labs and Imaging:  None - see hospital d/c summary for testing during hospitalization  Results/Tests Pending at Time of Discharge: none  Discharge Medications:  Allergies as of 04/15/2017      Reactions   Aspirin Other (See Comments)   "makes my stomach hurt"   Codeine Other (See Comments)   "i get irritated" Patient tolerates Tramadol without difficulty.       Medication List       Accurate as of 04/15/17  3:13 PM. Always use your most recent med list.          acetaminophen 325 MG tablet Commonly known as:  TYLENOL Take 2 tablets (650 mg total) by mouth 3 (three) times daily.   aspirin 81 MG EC tablet Take 1 tablet (81 mg total) by mouth daily.   atorvastatin 40 MG tablet Commonly known as:  LIPITOR Take 1 tablet (40 mg total) by mouth daily at 6 PM.   calcitRIOL 0.25 MCG capsule Commonly known as:  ROCALTROL Take 3 capsules (0.75 mcg total) by mouth every Monday, Wednesday, and Friday with hemodialysis.   calcium acetate 667 MG tablet Commonly known as:  PHOSLO Take 2 tablets (1,334 mg total) by mouth 3 (three) times daily with meals.   cholecalciferol 1000 units tablet Commonly known as:  VITAMIN D Take 1 tablet (1,000 Units total) by mouth daily.   clopidogrel 75 MG tablet Commonly known as:  PLAVIX Take 1 tablet (75 mg total) by mouth daily.   feeding supplement (NEPRO CARB STEADY) Liqd Take 237 mLs by mouth 2 (two) times daily between meals.   metoprolol tartrate 25 MG tablet Commonly known as:  LOPRESSOR Take 1 tablet (25 mg total) by mouth 2 (two) times daily.   nitroGLYCERIN 0.4 MG SL tablet Commonly known  as:  NITROSTAT Place 1 tablet (0.4 mg total) under the tongue every 5 (five) minutes as needed for chest pain.   pantoprazole 40 MG tablet Commonly known as:  PROTONIX Take 1 tablet (40 mg total) by mouth daily.   traMADol 50 MG tablet Commonly known as:  ULTRAM Take 0.5 tablets (25 mg total) by mouth every 8 (eight) hours as needed.   venlafaxine XR 75 MG 24 hr capsule Commonly known as:  EFFEXOR-XR Take 1 capsule (75 mg total) by mouth at bedtime.       Discharge Instructions: Please refer to Patient Instructions section of EMR for full details.  Patient was counseled important signs and symptoms that should prompt return to medical care, changes in medications, dietary instructions, activity restrictions, and follow up appointments.   Follow-Up Appointments: PCP: Jeneen RinksElsia Yoo, DO 04/21/17 at 10:30am Cardiology: Dr Sharyn LullHarwani 07/08/17 at 2:30 pm Orthopedics: Dr Winona LegatoS[place]tec 04/30/17 at 9:15 am [facility] 6/1 at 10am  Erasmo DownerBacigalupo, Angela M, MD 04/15/2017, 3:13 PM PGY-3, [facility]

## 2017-04-17 ENCOUNTER — Telehealth: Payer: Self-pay | Admitting: Family Medicine

## 2017-04-17 NOTE — Telephone Encounter (Signed)
[facility] disease [facility] once a week for one week, then twice for 4 weeks and 2 extra visits as needed.

## 2017-04-17 NOTE — Telephone Encounter (Signed)
Will forward to MD to give verbal ok for this. Jazmin Hartsell,CMA

## 2017-04-17 NOTE — Telephone Encounter (Signed)
Pam contacted and verbal orders given for pt.

## 2017-04-17 NOTE — Telephone Encounter (Signed)
[facility]: verbal orders for medication management and disease mgt for once a week for one week, then twice for 4 weeks and 2 extra visits as needed.

## 2017-04-21 ENCOUNTER — Inpatient Hospital Stay: Payer: Medicare Other | Admitting: Family Medicine

## 2017-04-29 ENCOUNTER — Encounter: Payer: Self-pay | Admitting: Vascular Surgery

## 2017-05-05 NOTE — Progress Notes (Deleted)
Established Dialysis Access   History of Present Illness   Monica [name] is a 83 y.o. (1932/04/10) female who presents for re-evaluation for permanent access.  The patient is right hand dominant.  Previous access procedures have been completed in the left arm.  The patient's complication from previous access procedures include: steal syndrome.  I excised the L FA AVF and vein patched the L brachial artery on 01/31/17.  The patient has never had a previous PPM placed.  I had referred the patient to OT for rehab.  Her L hand motor function is: ***   The patient's PMH, PSH, SH, and FamHx are unchanged from 02/20/17.  Current Outpatient Prescriptions  Medication Sig Dispense Refill  . acetaminophen (TYLENOL) 325 MG tablet Take 2 tablets (650 mg total) by mouth 3 (three) times daily. 180 tablet 0  . aspirin 81 MG EC tablet Take 1 tablet (81 mg total) by mouth daily. 30 tablet 0  . atorvastatin (LIPITOR) 40 MG tablet Take 1 tablet (40 mg total) by mouth daily at 6 PM. 30 tablet 0  . calcitRIOL (ROCALTROL) 0.25 MCG capsule Take 3 capsules (0.75 mcg total) by mouth every Monday, Wednesday, and Friday with hemodialysis. 40 capsule 0  . calcium acetate (PHOSLO) 667 MG tablet Take 2 tablets (1,334 mg total) by mouth 3 (three) times daily with meals. 180 tablet 0  . cholecalciferol (VITAMIN D) 1000 units tablet Take 1 tablet (1,000 Units total) by mouth daily. 30 tablet 0  . clopidogrel (PLAVIX) 75 MG tablet Take 1 tablet (75 mg total) by mouth daily. 30 tablet 0  . metoprolol tartrate (LOPRESSOR) 25 MG tablet Take 1 tablet (25 mg total) by mouth 2 (two) times daily. 60 tablet 0  . nitroGLYCERIN (NITROSTAT) 0.4 MG SL tablet Place 1 tablet (0.4 mg total) under the tongue every 5 (five) minutes as needed for chest pain. 30 tablet 0  . Nutritional Supplements (FEEDING SUPPLEMENT, NEPRO CARB STEADY,) LIQD Take 237 mLs by mouth 2 (two) times daily between meals. 60 Can 0  . pantoprazole (PROTONIX) 40 MG  tablet Take 1 tablet (40 mg total) by mouth daily. 30 tablet 0  . traMADol (ULTRAM) 50 MG tablet Take 0.5 tablets (25 mg total) by mouth every 8 (eight) hours as needed. 45 tablet 0  . venlafaxine XR (EFFEXOR-XR) 75 MG 24 hr capsule Take 1 capsule (75 mg total) by mouth at bedtime. 30 capsule 0   No current facility-administered medications for this visit.     Allergies  Allergen Reactions  . Aspirin Other (See Comments)    "makes my stomach hurt"  . Codeine Other (See Comments)    "i get irritated" Patient tolerates Tramadol without difficulty.     On ROS today: ***, ***   ***Physical Examination   There were no vitals filed for this visit.  There is no height or weight on file to calculate BMI.  General {LOC:19197::"Somulent","Alert"}, {Orientation:19197::"Confused","O x 3"}, {Weight:19197::"Obese","Cachectic","WD"}, {General state of health:19197::"Ill appearing","NAD"}  Pulmonary {Chest wall:19197::"Asx chest movement","Sym exp"}, {Air movt:19197::"Decreased *** air movt","good B air movt"}, {BS:19197::"rales on ***","rhonchi on ***","wheezing on ***","CTA B"}  Cardiac {Rhythm:19197::"Irregularly, irregular rate and rhythm","RRR, Nl S1, S2"}, {Murmur:19197::"Murmur present: ***","no Murmurs"}, {Rubs:19197::"Rub present: ***","No rubs"}, {Gallop:19197::"Gallop present: ***","No S3,S4"}  Vascular Vessel Right Left  Radial {Palpable:19197::"Not palpable","Faintly palpable","Palpable"} {Palpable:19197::"Not palpable","Faintly palpable","Palpable"}  Ulnar {Palpable:19197::"Not palpable","Faintly palpable","Palpable"} {Palpable:19197::"Not palpable","Faintly palpable","Palpable"}  Brachial {Palpable:19197::"Not palpable","Faintly palpable","Palpable"} {Palpable:19197::"Not palpable","Faintly palpable","Palpable"}    Gastrointestinal soft, {Distension:19197::"distended","non-distended"}, {TTP:19197::"TTP in *** quadrant","appropriate tenderness to palpation","non-tender  to palpation"},  {Guarding:19197::"Guarding and rebound in *** quadrant","No guarding or rebound"}, {HSM:19197::"HSM present","no HSM"}, {Masses:19197::"Mass present: ***","no masses"}, {Flank:19197::"CVAT on ***","Flank bruit present on ***","no CVAT B"}, {AAA:19197::"Palpable prominent aortic pulse","No palpable prominent aortic pulse"}, {Surgical incision:19197::"Surg incisions: ***","Surgical incisions well healed"," "}  Musculoskeletal M/S 5/5 throughout {MS:19197::"except ***"," "}, Extremities without ischemic changes {MS:19197::"except ***"," "}, {Edema:19197::"Edema present: ***","No edema present"}, ***palpable thrill, ***bruit, ***prior access  Neurologic Pain and light touch intact in extremities {CN:19197::"except for decreased sensation in ***"," "}, Motor exam as listed above    Non-Invasive Vascular Imaging   {Side:17767} arm Access Duplex  (***):   Diameters:  *** mm  Depth:  *** mm  PSV:  *** c/s  BUE Doppler (***):   R arm:   Brachial: {Signals:19197::"none","mono","bi","tri"}, *** mm  Radial: {Signals:19197::"none","mono","bi","tri"}, *** mm  Ulnar: {Signals:19197::"none","mono","bi","tri"}, *** mm  L arm:   Brachial: {Signals:19197::"none","mono","bi","tri"}, *** mm  Radial: {Signals:19197::"none","mono","bi","tri"}, *** mm  Ulnar: {Signals:19197::"none","mono","bi","tri"}, *** mm  B Vein Mapping  (***):   R arm: acceptable vein conduits include ***  L arm: acceptable vein conduits include ***   Outside Studies/Documentation   *** pages of outside documents were reviewed including: ***.   Medical Decision Making   Monica [name] is a 83 y.o. female who presents with ESRD chronic kidney disease stage requiring hemodialysis, s/p L FA AVG requiring excision and VPA L brachial artery for steal syndrome.   Based on vein mapping and examination, this patient's permanent access options include: ***  I had an extensive discussion with this patient in regards to the  nature of access surgery, including risk, benefits, and alternatives.    The patient is aware that the risks of access surgery include but are not limited to: bleeding, infection, steal syndrome, nerve damage, ischemic monomelic neuropathy, failure of access to mature, and possible need for additional access procedures in the future.  ***The patient is aware that I construct transposition procedures in two stages, requiring a separate operation to complete the procedure.  The patient has *** agreed to proceed with the above procedure which will be scheduled ***.   Leonides SakeBrian Little Bashore, MD, FACS [facility] Office: [phone] Pager: [phone]

## 2017-05-08 ENCOUNTER — Ambulatory Visit: Payer: Medicare Other | Admitting: Vascular Surgery

## 2017-05-08 NOTE — Addendum Note (Signed)
Addendum  created 05/08/17 1153 by Akshita Italiano, MD   Sign clinical note    

## 2017-05-11 NOTE — Addendum Note (Signed)
Addendum  created 05/11/17 1135 by Shanece Cochrane, MD   Sign clinical note    

## 2017-05-12 ENCOUNTER — Telehealth: Payer: Self-pay | Admitting: Family Medicine

## 2017-05-12 NOTE — Telephone Encounter (Signed)
Will forward to MD to make aware. Kary Sugrue,CMA  

## 2017-05-12 NOTE — Telephone Encounter (Signed)
[facility]ayer OhmChris, [facility], patient was evaluated on 05/08/17 and has refused serviced at this time. Any questions please call Thayer OhmChris at [phone].

## 2017-05-19 ENCOUNTER — Ambulatory Visit: Payer: Medicare Other | Admitting: Sports Medicine

## 2017-05-20 ENCOUNTER — Other Ambulatory Visit: Payer: Self-pay | Admitting: Family Medicine

## 2017-05-20 NOTE — Telephone Encounter (Signed)
Patient is a nursing home patient who is followed by us. Jazmin Hartsell,CMA

## 2017-05-20 NOTE — Telephone Encounter (Signed)
Spoke with emergency contact Rosezella FloridaCindy Barr and she will go and see patient and have her call us to schedule an appointment to follow up from the nursing home/establish in our clinic.  Shaynah Hund,CMA

## 2017-05-20 NOTE — Telephone Encounter (Signed)
Tried calling patient was unable to leave a message due to her voicemail being full.  Will try to reach her again before sending a letter to her home. Seanna Sisler,CMA

## 2017-05-20 NOTE — Telephone Encounter (Signed)
Will given 30day refill. Needs to make appointment to establish care.

## 2017-05-27 ENCOUNTER — Telehealth: Payer: Self-pay | Admitting: Family Medicine

## 2017-05-27 MED ORDER — CLOPIDOGREL BISULFATE 75 MG PO TABS: 75.0000 mg | ORAL_TABLET | Freq: Every day | ORAL | 0 refills | Status: DC

## 2017-05-27 MED ORDER — ATORVASTATIN CALCIUM 40 MG PO TABS: ORAL_TABLET | ORAL | 0 refills | Status: DC

## 2017-05-27 MED ORDER — VITAMIN D3 25 MCG (1000 UNIT) PO TABS: 1000.0000 [IU] | ORAL_TABLET | Freq: Every day | ORAL | 0 refills | Status: DC

## 2017-05-27 MED ORDER — ASPIRIN 81 MG PO TBEC: 81.0000 mg | DELAYED_RELEASE_TABLET | Freq: Every day | ORAL | 0 refills | Status: DC

## 2017-05-27 MED ORDER — PANTOPRAZOLE SODIUM 40 MG PO TBEC: 40.0000 mg | DELAYED_RELEASE_TABLET | Freq: Every day | ORAL | 0 refills | Status: DC

## 2017-05-27 MED ORDER — VENLAFAXINE HCL ER 75 MG PO CP24: 75.0000 mg | ORAL_CAPSULE | Freq: Every day | ORAL | 0 refills | Status: DC

## 2017-05-27 MED ORDER — METOPROLOL TARTRATE 25 MG PO TABS: 25.0000 mg | ORAL_TABLET | Freq: Two times a day (BID) | ORAL | 0 refills | Status: DC

## 2017-05-27 NOTE — Telephone Encounter (Signed)
Will send in 30 day refills since patient is coming in for nursing home f/u and to establish with me on 06/11/17. What specifically does Melissa need from me regarding the nurse visits? My understanding was that patient was refusing home health services earlier this month.

## 2017-05-27 NOTE — Telephone Encounter (Signed)
Melissa would like more nurse visits to help pt with meds, once a week for four weeks. Pt has been out of medication for several days. Pt meds come in blister packs, Melissa is unsure of what medications need to be refilled, but [facility]. ep

## 2017-05-28 ENCOUNTER — Telehealth: Payer: Self-pay | Admitting: Family Medicine

## 2017-05-28 NOTE — Telephone Encounter (Signed)
Dereck LigasWarner, Jacqueline L 16 minutes ago (3:47 PM)      Efraim KaufmannMelissa is returning Jazmin's call. She said that the patient now wants home health. She is very confused on what her medications are, how to use the, so she is not taking and would like some help managing this. Melissa is looking for 1 time a week for 4 weeks. Please call her with verbal orders. jw     Ok for verbal orders?

## 2017-05-28 NOTE — Telephone Encounter (Signed)
LM for Monica [name] inquiring about patient declining initial home health requests.  Jazmin Hartsell,CMA

## 2017-05-28 NOTE — Telephone Encounter (Signed)
Efraim KaufmannMelissa is returning Jazmin's call. She said that the patient now wants home health. She is very confused on what her medications are, how to use the, so she is not taking and would like some help managing this. Melissa is looking for 1 time a week for 4 weeks. Please call her with verbal orders. jw

## 2017-05-29 NOTE — Telephone Encounter (Signed)
Spoke with Melissa and gave verbal orders.

## 2017-05-31 ENCOUNTER — Ambulatory Visit (HOSPITAL_COMMUNITY)
Admission: EM | Admit: 2017-05-31 | Discharge: 2017-05-31 | Disposition: A | Discharge status: Home / Self Care | Payer: Medicare Other | Attending: Family Medicine | Admitting: Family Medicine

## 2017-05-31 ENCOUNTER — Encounter (HOSPITAL_COMMUNITY): Payer: Self-pay | Admitting: Emergency Medicine

## 2017-05-31 ENCOUNTER — Ambulatory Visit (HOSPITAL_COMMUNITY): Payer: Medicare Other

## 2017-05-31 DIAGNOSIS — S8012XA Contusion of left lower leg, initial encounter: Secondary | ICD-10-CM | POA: Insufficient documentation

## 2017-05-31 DIAGNOSIS — X58XXXA Exposure to other specified factors, initial encounter: Secondary | ICD-10-CM | POA: Insufficient documentation

## 2017-05-31 DIAGNOSIS — T148XXA Other injury of unspecified body region, initial encounter: Secondary | ICD-10-CM

## 2017-05-31 DIAGNOSIS — S8992XA Unspecified injury of left lower leg, initial encounter: Secondary | ICD-10-CM

## 2017-05-31 MED ORDER — TRAMADOL HCL 50 MG PO TABS: 25.0000 mg | ORAL_TABLET | Freq: Three times a day (TID) | ORAL | 0 refills | Status: DC | PRN

## 2017-05-31 NOTE — ED Triage Notes (Signed)
Pt was at dialysis on Monday and was injured on her left shin.  Her wheelchair rolled forward and her leg hit the back of an iron bench.  Pt has swelling and redness around the area of injury and the small wound is clean, dry, and intact.

## 2017-05-31 NOTE — ED Provider Notes (Signed)
[facility]    CSN: [id number] Arrival date & time: 05/31/17  1159     History   Chief Complaint Chief Complaint  Patient presents with  . Leg Injury    HPI Monica [name] is a 83 y.o. female.   HPI  On Monday, 6 days ago, the patient was in a wheelchair and slid into a metal bench. She struck her left lower extremity. It was bleeding and swelled up. She's been having pain and swelling ever since. No purulent drainage or bleeding since Monday. She is here with her neighbor today. Her neighbor is wondering if it needs to be lanced open. There is no spreading redness, warmth, or fevers. She has been using Tylenol without relief at home. She has end-stage renal disease on dialysis Monday, Wednesday, and Friday so has not been able to take anti-inflammatories.  Past Medical History:  Diagnosis Date  . Abscess of buttock 12/29/2013  . Acute blood loss anemia 03/13/2017  . Acute delirium   . Anemia of renal disease 03/13/2017  . Anemia, chronic renal failure, stage 5 (HCC) 10/23/2016  . Anxiety   . Arthritis    "all over" (01/29/2017)  . Atrial flutter (HCC)   . Chronic back pain    "all my back" (01/29/2017)  . CKD (chronic kidney disease) stage 5, GFR less than 15 ml/min (HCC) 12/29/2013  . Constipation   . Dementia   . Depression   . Displaced fracture of right femoral neck (HCC)   . Elevated troponin   . Encounter for nasogastric (NG) tube placement   . Escherichia coli urinary tract infection 12/29/2013  . Escherichia coli urinary tract infection 12/29/2013  . ESRD (end stage renal disease) on dialysis [facility](HCC)    "MWF; [address]" (01/29/2017)  . ETOHism (HCC)    Quit in the 1980s.  . Gastric ulcer 12/2013.   Prepyloric ulcer. On follow-up EGD in 02/2014 this was 90% healed  . Gastric ulcer with hemorrhage 12/29/2013  . GERD (gastroesophageal reflux disease)   . Headache    as a young woman  . Hemorrhoids   . History of alcohol dependence (HCC) 03/23/2017  . History  of gastric ulcer 03/23/2017  . History of kidney stones   . History of stomach ulcers   . Hypertension   . Left sided chest pain 10/28/2016   See 10/28/16 she describes pleuritic-type pain on the left since a fall approximately 3 weeks ago Apparently she slipped in her tub without cardiac or neurologic prodrome  . Melena 12/29/2013  . Pancreatic cyst 09/2010.   At uncinate process.  Serial CT imaging favors benign process  . Protein-calorie malnutrition, severe 10/25/2016  . SBO (small bowel obstruction) (HCC)   . Unintentional weight loss 10/23/2016  . Upper GI bleed 12/28/2013    Patient Active Problem List   Diagnosis Date Noted  . Anxiety state (neurotic) 03/25/2017  . History of total right hip arthroplasty 03/23/2017  . Impaired mobility and ADLs 03/23/2017  . Lives alone with some help available 03/23/2017  . Tooth pain 03/23/2017  . History of alcohol dependence (HCC) 03/23/2017  . History of gastric ulcer with bleeding 03/23/2017  . Sacral decubitus ulcer, stage II   . Anemia of renal disease 03/13/2017  . Malnutrition of moderate degree 03/11/2017  . ST elevation myocardial infarction (STEMI) (HCC)   . Closed displaced fracture of right femoral neck (HCC)   . Steal syndrome as complication of dialysis access (HCC) 01/31/2017  . ESRD  on dialysis (HCC) 01/29/2017  . Possible Cognitive impairment 10/23/2016  . Pancreatic cyst   . HTN (hypertension) 12/29/2013  . PAF (paroxysmal atrial fibrillation) (HCC) 12/29/2013    Past Surgical History:  Procedure Laterality Date  . ABDOMINAL HYSTERECTOMY    . ANTERIOR APPROACH HEMI HIP ARTHROPLASTY Right 03/13/2017   Procedure: RIGHT TOTAL HIP ARTHROPLASTY;  Surgeon: Samson Frederic, MD;  Location: [facility];  Service: Orthopedics;  Laterality: Right;  . ARTERIOVENOUS GRAFT PLACEMENT Left 01/29/2017   INSERTION OF ARTERIOVENOUS (AV) GORE-TEX GRAFT  STRETCHED IN LEFT FORE ARM   . AV FISTULA PLACEMENT Left 03/20/2015   Procedure:  ARTERIOVENOUS (AV) FISTULA CREATION;  Surgeon: Chuck Hint, MD;  Location: [facility] OR;  Service: Vascular;  Laterality: Left;  . AV FISTULA PLACEMENT Left 01/29/2017   Procedure: INSERTION OF ARTERIOVENOUS (AV) GORE-TEX GRAFT  STRETCHED IN LEFT FORE ARM;  Surgeon: Chuck Hint, MD;  Location: [facility] OR;  Service: Vascular;  Laterality: Left;  . AVGG REMOVAL Left 01/31/2017   Procedure: REMOVAL OF ARTERIOVENOUS GORETEX GRAFT (AVGG) WITH Vein patching.;  Surgeon: Fransisco Hertz, MD;  Location[facility] OR;  Service: Vascular;  Laterality: Left;  . CATARACT EXTRACTION Right   . COLONOSCOPY  2010   Per Dr. Loreta Ave  . ESOPHAGOGASTRODUODENOSCOPY N/A 12/29/2013   Procedure: ESOPHAGOGASTRODUODENOSCOPY (EGD);  Surgeon: Hart Carwin, MD;  Location: [facility] ENDOSCOPY;  Service: Endoscopy;  Laterality: N/A;  . EXPLORATORY LAPAROTOMY  1980   w/LOA  . FLEXIBLE SIGMOIDOSCOPY  08/2011   Per Dr. Randa Evens. Normal study. Random biopsies positive for trace melanosis coli.  Marland Kitchen FOOT SURGERY Bilateral    "surgery for flat feet"  . INSERTION OF DIALYSIS CATHETER Right 11/25/2016   Procedure: INSERTION OF Right Internal jugular DIALYSIS CATHETER;  Surgeon: Fransisco Hertz, MD;  Location: [facility] OR;  Service: Vascular;  Laterality: Right;  . SHOULDER ARTHROSCOPY W/ ROTATOR CUFF REPAIR Right 08/2003   Hattie Perch 01/29/2017  . TIBIA FRACTURE SURGERY Right    "broke between my knee and my ankle"  . TOTAL ABDOMINAL HYSTERECTOMY W/ BILATERAL SALPINGOOPHORECTOMY  1980  . TUBAL LIGATION      Home Medications    Prior to Admission medications   Medication Sig Start Date End Date Taking? Authorizing Provider  acetaminophen (TYLENOL) 325 MG tablet Take 2 tablets (650 mg total) by mouth 3 (three) times daily. 04/15/17  Yes Bacigalupo, Marzella Schlein, MD  aspirin 81 MG EC tablet Take 1 tablet (81 mg total) by mouth daily. 05/27/17  Yes Jeneen Rinks J, DO  atorvastatin (LIPITOR) 40 MG tablet TAKE 1 TABLET BY MOUTH DAILY AT 6PM 05/27/17  Yes Leland Her, DO   calcitRIOL (ROCALTROL) 0.25 MCG capsule Take 3 capsules (0.75 mcg total) by mouth every Monday, Wednesday, and Friday with hemodialysis. 04/15/17  Yes Bacigalupo, Marzella Schlein, MD  calcium acetate (PHOSLO) 667 MG tablet Take 2 tablets (1,334 mg total) by mouth 3 (three) times daily with meals. 04/15/17  Yes Bacigalupo, Marzella Schlein, MD  cholecalciferol (VITAMIN D) 1000 units tablet Take 1 tablet (1,000 Units total) by mouth daily. 05/27/17  Yes Leland Her, DO  clopidogrel (PLAVIX) 75 MG tablet Take 1 tablet (75 mg total) by mouth daily. 05/27/17  Yes Jeneen Rinks J, DO  metoprolol tartrate (LOPRESSOR) 25 MG tablet Take 1 tablet (25 mg total) by mouth 2 (two) times daily. 05/27/17  Yes Jeneen Rinks J, DO  pantoprazole (PROTONIX) 40 MG tablet Take 1 tablet (40 mg total) by mouth daily. 05/27/17  Yes  Leland Her, DO  venlafaxine XR (EFFEXOR-XR) 75 MG 24 hr capsule Take 1 capsule (75 mg total) by mouth at bedtime. 05/27/17  Yes Jeneen Rinks J, DO  nitroGLYCERIN (NITROSTAT) 0.4 MG SL tablet Place 1 tablet (0.4 mg total) under the tongue every 5 (five) minutes as needed for chest pain. 04/15/17   Bacigalupo, Marzella Schlein, MD  Nutritional Supplements (FEEDING SUPPLEMENT, NEPRO CARB STEADY,) LIQD Take 237 mLs by mouth 2 (two) times daily between meals. 04/15/17   Bacigalupo, Marzella Schlein, MD  traMADol (ULTRAM) 50 MG tablet Take 0.5 tablets (25 mg total) by mouth every 8 (eight) hours as needed. 05/31/17   Sharlene Dory, DO    Family History Family History  Problem Relation Age of Onset  . Alcohol abuse Mother     Social History Social History  Substance Use Topics  . Smoking status: Former Smoker    Packs/day: 0.50    Years: 32.00    Types: Cigarettes    Quit date: 02/14/1986  . Smokeless tobacco: Never Used  . Alcohol use Yes     Comment: 01/29/2017 "used to have a problem w/it; nothing since 1987"     Allergies   Aspirin and Codeine   Review of Systems Review of Systems  Constitutional: Negative for fever.    Musculoskeletal:       +L leg pain     Physical Exam Triage Vital Signs ED Triage Vitals  Enc Vitals Group     BP 05/31/17 1229 (!) 170/84     Pulse Rate 05/31/17 1229 78     Temp 05/31/17 1229 98.6 F (37 C)     Temp Source 05/31/17 1229 Oral     SpO2 05/31/17 1229 100 %     Pain Score 05/31/17 1225 8   Updated Vital Signs BP (!) 170/84 (BP Location: Right Arm)   Pulse 78   Temp 98.6 F (37 C) (Oral)   SpO2 100%   Physical Exam  Constitutional: She is oriented to person, place, and time. She appears well-developed and well-nourished.  HENT:  Head: Normocephalic and atraumatic.  Pulmonary/Chest: Effort normal.  Musculoskeletal:  +TTP distally to hematoma on anterior L lower extremity  Neurological: She is alert and oriented to person, place, and time.  Skin:  There is a raised area on the anterior distal middle 1/3 of tibia, no warmth, fluctuance, mild central erythema around area of scabbing.   Psychiatric: She has a normal mood and affect.     UC Treatments / Results  Radiology Dg Tibia/fibula Left  Result Date: 05/31/2017 CLINICAL DATA:  Blunt trauma to the lower leg. EXAM: LEFT TIBIA AND FIBULA - 2 VIEW COMPARISON:  None. FINDINGS: There is no fracture or other focal bone lesion. There is soft tissue swelling anterior to the mid left tibia consistent with soft tissue contusion. IMPRESSION: Soft tissue contusion. Electronically Signed   By: Francene Boyers M.D.   On: 05/31/2017 13:50    Procedures Procedures - none  Initial Impression / Assessment and Plan / UC Course  I have reviewed the triage vital signs and the nursing notes.  Pertinent labs & imaging results that were available during my care of the patient were reviewed by me and considered in my medical decision making (see chart for details).     Pt presents with hematoma on LLE. No fx on X-ray. She is quite painful. BP likely related to poor medication compliance and that she has not had HD in 2  days. Tylenol, ice and Tramadol for breakthrough pain. Blood will resorb steadily over the next several weeks. F/u with PCP as needed. She is discharged in stable condition. The patient and her neighbor voiced understanding and agreement to the plan.   Final Clinical Impressions(s) / UC Diagnoses   Final diagnoses:  Hematoma  Injury of left lower extremity, initial encounter    New Prescriptions Discharge Medication List as of 05/31/2017  1:45 PM       Sharlene Dory, DO 05/31/17 1409

## 2017-05-31 NOTE — Discharge Instructions (Signed)
Ice, elevation, Tylenol. Use medicine I give you for breakthrough pain only.

## 2017-06-05 ENCOUNTER — Ambulatory Visit: Payer: Medicare Other | Admitting: Vascular Surgery

## 2017-06-11 ENCOUNTER — Ambulatory Visit: Payer: Medicare Other | Admitting: Family Medicine

## 2017-06-15 ENCOUNTER — Emergency Department (HOSPITAL_COMMUNITY)
Admission: EM | Admit: 2017-06-15 | Discharge: 2017-06-15 | Disposition: A | Discharge status: Home / Self Care | Payer: Medicare Other | Attending: Emergency Medicine | Admitting: Emergency Medicine

## 2017-06-15 ENCOUNTER — Emergency Department (HOSPITAL_COMMUNITY): Payer: Medicare Other

## 2017-06-15 ENCOUNTER — Encounter (HOSPITAL_COMMUNITY): Payer: Self-pay | Admitting: Emergency Medicine

## 2017-06-15 DIAGNOSIS — L03116 Cellulitis of left lower limb: Secondary | ICD-10-CM | POA: Diagnosis not present

## 2017-06-15 DIAGNOSIS — Z7982 Long term (current) use of aspirin: Secondary | ICD-10-CM | POA: Diagnosis not present

## 2017-06-15 DIAGNOSIS — Y939 Activity, unspecified: Secondary | ICD-10-CM | POA: Diagnosis not present

## 2017-06-15 DIAGNOSIS — Z7902 Long term (current) use of antithrombotics/antiplatelets: Secondary | ICD-10-CM | POA: Insufficient documentation

## 2017-06-15 DIAGNOSIS — Y999 Unspecified external cause status: Secondary | ICD-10-CM | POA: Diagnosis not present

## 2017-06-15 DIAGNOSIS — Z87891 Personal history of nicotine dependence: Secondary | ICD-10-CM | POA: Insufficient documentation

## 2017-06-15 DIAGNOSIS — S72002A Fracture of unspecified part of neck of left femur, initial encounter for closed fracture: Secondary | ICD-10-CM

## 2017-06-15 DIAGNOSIS — Y929 Unspecified place or not applicable: Secondary | ICD-10-CM | POA: Insufficient documentation

## 2017-06-15 DIAGNOSIS — N185 Chronic kidney disease, stage 5: Secondary | ICD-10-CM | POA: Diagnosis not present

## 2017-06-15 DIAGNOSIS — Z79899 Other long term (current) drug therapy: Secondary | ICD-10-CM | POA: Insufficient documentation

## 2017-06-15 DIAGNOSIS — S79912A Unspecified injury of left hip, initial encounter: Secondary | ICD-10-CM | POA: Diagnosis present

## 2017-06-15 DIAGNOSIS — I120 Hypertensive chronic kidney disease with stage 5 chronic kidney disease or end stage renal disease: Secondary | ICD-10-CM | POA: Insufficient documentation

## 2017-06-15 DIAGNOSIS — M25552 Pain in left hip: Secondary | ICD-10-CM

## 2017-06-15 DIAGNOSIS — F0390 Unspecified dementia without behavioral disturbance: Secondary | ICD-10-CM | POA: Insufficient documentation

## 2017-06-15 DIAGNOSIS — T148XXA Other injury of unspecified body region, initial encounter: Secondary | ICD-10-CM

## 2017-06-15 DIAGNOSIS — W19XXXA Unspecified fall, initial encounter: Secondary | ICD-10-CM

## 2017-06-15 DIAGNOSIS — S8012XA Contusion of left lower leg, initial encounter: Secondary | ICD-10-CM | POA: Insufficient documentation

## 2017-06-15 LAB — CBC WITH DIFFERENTIAL/PLATELET
BASOS ABS: 0 10*3/uL (ref 0.0–0.1)
BASOS PCT: 0 %
Eosinophils Absolute: 0.1 10*3/uL (ref 0.0–0.7)
Eosinophils Relative: 2 %
HEMATOCRIT: 32.1 % — AB (ref 36.0–46.0)
HEMOGLOBIN: 10.4 g/dL — AB (ref 12.0–15.0)
Lymphocytes Relative: 32 %
Lymphs Abs: 1.8 10*3/uL (ref 0.7–4.0)
MCH: 27.2 pg (ref 26.0–34.0)
MCHC: 32.4 g/dL (ref 30.0–36.0)
MCV: 84 fL (ref 78.0–100.0)
MONOS PCT: 6 %
Monocytes Absolute: 0.3 10*3/uL (ref 0.1–1.0)
NEUTROS ABS: 3.4 10*3/uL (ref 1.7–7.7)
NEUTROS PCT: 60 %
Platelets: 318 10*3/uL (ref 150–400)
RBC: 3.82 MIL/uL — ABNORMAL LOW (ref 3.87–5.11)
RDW: 15.8 % — ABNORMAL HIGH (ref 11.5–15.5)
WBC: 5.7 10*3/uL (ref 4.0–10.5)

## 2017-06-15 LAB — BASIC METABOLIC PANEL
ANION GAP: 15 (ref 5–15)
BUN: 55 mg/dL — ABNORMAL HIGH (ref 6–20)
CHLORIDE: 103 mmol/L (ref 101–111)
CO2: 18 mmol/L — AB (ref 22–32)
Calcium: 8.9 mg/dL (ref 8.9–10.3)
Creatinine, Ser: 9.58 mg/dL — ABNORMAL HIGH (ref 0.44–1.00)
GFR calc non Af Amer: 3 mL/min — ABNORMAL LOW (ref >60)
GFR, EST AFRICAN AMERICAN: 4 mL/min — AB (ref >60)
Glucose, Bld: 80 mg/dL (ref 65–99)
Potassium: 4.7 mmol/L (ref 3.5–5.1)
Sodium: 136 mmol/L (ref 135–145)

## 2017-06-15 MED ORDER — LIDOCAINE-EPINEPHRINE (PF) 2 %-1:200000 IJ SOLN
10.0000 mL | Freq: Once | INTRAMUSCULAR | Status: AC
  Administered 2017-06-15: 10 mL via INTRADERMAL
  Filled 2017-06-15: qty 20

## 2017-06-15 MED ORDER — CEPHALEXIN 250 MG PO CAPS: 250.0000 mg | ORAL_CAPSULE | ORAL | 0 refills | Status: DC

## 2017-06-15 MED ORDER — LIDOCAINE-EPINEPHRINE 2 %-1:100000 IJ SOLN: 10.0000 mL | Freq: Once | INTRAMUSCULAR | Status: DC

## 2017-06-15 NOTE — ED Triage Notes (Signed)
Patient arrives with [facility] from home where she lives alone.  Patient states she fell on Thursday and has been having 8/10 left hip pain ever since.  Patient has history of hypertension and dementia per [facility], is alert and oriented at this time.  Patient was hypertensive on scene with BP 204/100, patient took her antihypertensive medication with EMS prior to leaving home.  Patient also requesting assistance establishing home health, states she has no one to help take care of her.  Also is MWF dialysis patient, last treatment on Wednesday, patient states she was unable to make it to Friday dialysis due to having repairs done in her kitchen.

## 2017-06-15 NOTE — Discharge Instructions (Signed)
Walk with your walker, you may bear weight as tolerated per orthopedics, but do not bear weight if it causes you pain. Follow up closely with Dr. Eulah PontMurphy.  Use warm compresses over your leg hematoma (bruise over your tibia), if it develops increasing redness, swelling, or you have a fever seek medical care.

## 2017-06-15 NOTE — Consult Note (Signed)
Reason for Consult:Left hip pain Referring Physician: E Schlossman  Monica [name] is an 83 y.o. female.  HPI: Kennidy fell at home while trying to sit in a chair on Tuesday of last week. She had immediate left hip pain but has been able to bear a little weight on it earlier in the week. As time has gone by the pain has worsened and she's been unable to put weight on it. She lives alone.  Past Medical History:  Diagnosis Date  . Abscess of buttock 12/29/2013  . Acute blood loss anemia 03/13/2017  . Acute delirium   . Anemia of renal disease 03/13/2017  . Anemia, chronic renal failure, stage 5 (HCC) 10/23/2016  . Anxiety   . Arthritis    "all over" (01/29/2017)  . Atrial flutter ([place])   . Chronic back pain    "all my back" (01/29/2017)  . CKD (chronic kidney disease) stage 5, GFR less than 15 ml/min (HCC) 12/29/2013  . Constipation   . Dementia   . Depression   . Displaced fracture of right femoral neck ([place])   . Elevated troponin   . Encounter for nasogastric (NG) tube placement   . Escherichia coli urinary tract infection 12/29/2013  . Escherichia coli urinary tract infection 12/29/2013  . ESRD (end stage renal disease) on dialysis [facility])    "MWF; Jeneen Rinks" (01/29/2017)  . ETOHism ([place])    Quit in the 1980s.  . Gastric ulcer 12/2013.   Prepyloric ulcer. On follow-up EGD in 02/2014 this was 90% healed  . Gastric ulcer with hemorrhage 12/29/2013  . GERD (gastroesophageal reflux disease)   . Headache    as a young woman  . Hemorrhoids   . History of alcohol dependence ([place]) 03/23/2017  . History of gastric ulcer 03/23/2017  . History of kidney stones   . History of stomach ulcers   . Hypertension   . Left sided chest pain 10/28/2016   See 10/28/16 she describes pleuritic-type pain on the left since a fall approximately 3 weeks ago Apparently she slipped in her tub without cardiac or neurologic prodrome  . Melena 12/29/2013  . Pancreatic cyst 09/2010.   At uncinate process.  Serial CT  imaging favors benign process  . Protein-calorie malnutrition, severe 10/25/2016  . SBO (small bowel obstruction) ([place])   . Unintentional weight loss 10/23/2016  . Upper GI bleed 12/28/2013    Past Surgical History:  Procedure Laterality Date  . ABDOMINAL HYSTERECTOMY    . ANTERIOR APPROACH HEMI HIP ARTHROPLASTY Right 03/13/2017   Procedure: RIGHT TOTAL HIP ARTHROPLASTY;  Surgeon: Rod Can, MD;  Location: [place];  Service: Orthopedics;  Laterality: Right;  . ARTERIOVENOUS GRAFT PLACEMENT Left 01/29/2017   INSERTION OF ARTERIOVENOUS (AV) GORE-TEX GRAFT  STRETCHED IN LEFT FORE ARM   . AV FISTULA PLACEMENT Left 03/20/2015   Procedure: ARTERIOVENOUS (AV) FISTULA CREATION;  Surgeon: Angelia Mould, MD;  Location: [place];  Service: Vascular;  Laterality: Left;  . AV FISTULA PLACEMENT Left 01/29/2017   Procedure: INSERTION OF ARTERIOVENOUS (AV) GORE-TEX GRAFT  STRETCHED IN LEFT FORE ARM;  Surgeon: Angelia Mould, MD;  Location: [place];  Service: Vascular;  Laterality: Left;  . [place] REMOVAL Left 01/31/2017   Procedure: REMOVAL OF ARTERIOVENOUS GORETEX GRAFT ([place]) WITH Vein patching.;  Surgeon: Conrad C[place]ville, MD;  Location: [place];  Service: Vascular;  Laterality: Left;  . CATARACT EXTRACTION Right   . COLONOSCOPY  2010   Per Dr. Collene Mares  . ESOPHAGOGASTRODUODENOSCOPY N/A  12/29/2013   Procedure: ESOPHAGOGASTRODUODENOSCOPY (EGD);  Surgeon: Lafayette Dragon, MD;  Location[facility] ENDOSCOPY;  Service: Endoscopy;  Laterality: N/A;  . [place]   w/LOA  . FLEXIBLE SIGMOIDOSCOPY  08/2011   Per Dr. Oletta Lamas. Normal study. Random biopsies positive for trace melanosis coli.  Marland Kitchen FOOT SURGERY Bilateral    "surgery for flat feet"  . INSERTION OF DIALYSIS CATHETER Right 11/25/2016   Procedure: INSERTION OF Right Internal jugular DIALYSIS CATHETER;  Surgeon: Conrad [place], MD;  Location: [place];  Service: Vascular;  Laterality: Right;  . SHOULDER ARTHROSCOPY W/ ROTATOR CUFF REPAIR Right 08/2003    Archie Endo 01/29/2017  . TIBIA FRACTURE SURGERY Right    "broke between my knee and my ankle"  . TOTAL ABDOMINAL HYSTERECTOMY W/ BILATERAL SALPINGOOPHORECTOMY  1980  . TUBAL LIGATION      Family History  Problem Relation Age of Onset  . Alcohol abuse Mother     Social History:  reports that she quit smoking about 31 years ago. Her smoking use included Cigarettes. She has a 16.00 pack-year smoking history. She has never used smokeless tobacco. She reports that she drinks alcohol. She reports that she does not use drugs.  Allergies:  Allergies  Allergen Reactions  . Aspirin Other (See Comments)    "makes my stomach hurt"  . Codeine Other (See Comments)    "i get irritated" Patient tolerates Tramadol without difficulty.     Medications: I have reviewed the patient's current medications.  Results for orders placed or performed during the hospital encounter of 06/15/17 (from the past 48 hour(s))  CBC with Differential     Status: Abnormal   Collection Time: 06/15/17 11:52 AM  Result Value Ref Range   WBC 5.7 4.0 - 10.5 K/uL   RBC 3.82 (L) 3.87 - 5.11 MIL/uL   Hemoglobin 10.4 (L) 12.0 - 15.0 g/dL   HCT 32.1 (L) 36.0 - 46.0 %   MCV 84.0 78.0 - 100.0 fL   MCH 27.2 26.0 - 34.0 pg   MCHC 32.4 30.0 - 36.0 g/dL   RDW 15.8 (H) 11.5 - 15.5 %   Platelets 318 150 - 400 K/uL   Neutrophils Relative % 60 %   Neutro Abs 3.4 1.7 - 7.7 K/uL   Lymphocytes Relative 32 %   Lymphs Abs 1.8 0.7 - 4.0 K/uL   Monocytes Relative 6 %   Monocytes Absolute 0.3 0.1 - 1.0 K/uL   Eosinophils Relative 2 %   Eosinophils Absolute 0.1 0.0 - 0.7 K/uL   Basophils Relative 0 %   Basophils Absolute 0.0 0.0 - 0.1 K/uL  Basic metabolic panel     Status: Abnormal   Collection Time: 06/15/17 11:52 AM  Result Value Ref Range   Sodium 136 135 - 145 mmol/L   Potassium 4.7 3.5 - 5.1 mmol/L   Chloride 103 101 - 111 mmol/L   CO2 18 (L) 22 - 32 mmol/L   Glucose, Bld 80 65 - 99 mg/dL   BUN 55 (H) 6 - 20 mg/dL    Creatinine, Ser 9.58 (H) 0.44 - 1.00 mg/dL   Calcium 8.9 8.9 - 10.3 mg/dL   GFR calc non Af Amer 3 (L) >60 mL/min   GFR calc Af Amer 4 (L) >60 mL/min    Comment: (NOTE) The eGFR has been calculated using the CKD EPI equation. This calculation has not been validated in all clinical situations. eGFR's persistently <60 mL/min signify possible Chronic Kidney Disease.    Anion  gap 15 5 - 15    Ct Hip Left Wo Contrast  Result Date: 06/15/2017 CLINICAL DATA:  Status post fall.  Left hip pain. EXAM: CT OF THE LEFT HIP WITHOUT CONTRAST TECHNIQUE: Multidetector CT imaging of the left hip was performed according to the standard protocol. Multiplanar CT image reconstructions were also generated. COMPARISON:  None. FINDINGS: Bones/Joint/Cartilage Generalized osteopenia. Cortical irregularity along the superior femoral head and neck junction concerning for possible nondisplaced subcapital fracture. No other fracture or dislocation. Normal alignment. No joint effusion. Degenerative disc disease with disc height loss at L4-5 and L5-S1. Grade 1 anterolisthesis of L4 on L5 secondary to bilateral facet arthropathy. Bilateral facet arthropathy at L5-S1. Degenerative changes of the pubic symphysis. Ligaments Ligaments are suboptimally evaluated by CT. Muscles and Tendons Muscles are normal.  No muscle atrophy. Soft tissue No fluid collection or hematoma.  No soft tissue mass. IMPRESSION: Cortical irregularity along the superior femoral head and neck junction concerning for possible nondisplaced subcapital fracture. Given the degree of osteopenia, further evaluation with MRI of the left hip is recommended. Electronically Signed   By: Kathreen Devoid   On: 06/15/2017 12:20   Dg Hip Unilat With Pelvis 2-3 Views Left  Result Date: 06/15/2017 CLINICAL DATA:  Anterior left hip pain after fall on Thursday. Initial encounter. EXAM: DG HIP (WITH OR WITHOUT PELVIS) 2-3V LEFT COMPARISON:  None. FINDINGS: No evidence of left hip  fracture or malalignment. Bipolar right hip hemiarthroplasty that is located. Osteopenia. No evidence of pelvic ring fracture, limited at the sacrum due to overlapping bowel gas. IMPRESSION: No acute finding. Electronically Signed   By: Monte Fantasia M.D.   On: 06/15/2017 10:31    Review of Systems  Constitutional: Negative for weight loss.  HENT: Negative for ear discharge, ear pain, hearing loss and tinnitus.   Eyes: Negative for blurred vision, double vision, photophobia and pain.  Respiratory: Negative for cough, sputum production and shortness of breath.   Cardiovascular: Negative for chest pain.  Gastrointestinal: Negative for abdominal pain, nausea and vomiting.  Genitourinary: Negative for dysuria, flank pain, frequency and urgency.  Musculoskeletal: Positive for joint pain (Left hip). Negative for back pain, falls, myalgias and neck pain.  Neurological: Negative for dizziness, tingling, sensory change, focal weakness, loss of consciousness and headaches.  Endo/Heme/Allergies: Does not bruise/bleed easily.  Psychiatric/Behavioral: Negative for depression, memory loss and substance abuse. The patient is not nervous/anxious.    Blood pressure 128/62, pulse 78, temperature 98 F (36.7 C), temperature source Oral, resp. rate 14, SpO2 92 %. Physical Exam  Constitutional: She appears well-developed and well-nourished. No distress.  HENT:  Head: Normocephalic.  Eyes: Conjunctivae are normal. Right eye exhibits no discharge. Left eye exhibits no discharge. No scleral icterus.  Cardiovascular: Normal rate and regular rhythm.   Respiratory: Effort normal. No respiratory distress.  Musculoskeletal:  RLE No traumatic wounds, ecchymosis, or rash  Nontender  No effusions  Knee stable to varus/ valgus and anterior/posterior stress  Sens DPN, SPN, TN intact  Motor EHL, ext, flex, evers 5/5  DP 2+, PT 2+, No significant edema   LLE No traumatic wounds, ecchymosis, or rash             Pain  with AROM/PROM, esp int hip rotation  Nontender  No effusions  Knee stable to varus/ valgus and anterior/posterior stress  Sens DPN, SPN, TN intact  Motor EHL, ext, flex, evers 5/5  DP 1+, PT 0, No significant edema  Neurological: She is alert.  Skin: Skin is warm and dry. She is not diaphoretic.  Psychiatric: She has a normal mood and affect. Her behavior is normal.    Assessment/Plan: Fall Left hip pain -- There is a very small irregularity on the anterior cortex of the neck. This could represent a small fx but I doubt it unstable. She has been able to maintain NWB on a RW at home. I would recommend continuing WBAT on RW with close OP f/u. Given her ESRD this could easily convert to a complete fx and then she would need fixation. She should f/u with Dr. Percell Miller in 1-2 weeks. Left tibial lesion -- For I&D in ED by EDP.     Lisette Abu, PA-C Orthopedic Surgery [phone] 06/15/2017, 3:12 PM
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
## 2017-06-15 NOTE — ED Provider Notes (Signed)
[facility]-EMERGENCY DEPT Provider Note   CSN: [id number] Arrival date & time: 06/15/17  0917     History   Chief Complaint Chief Complaint  Patient presents with  . Hip Pain    HPI Monica [name] is a 83 y.o. female.  HPI   Presents with concern for left hip pain. Reports falling on her hip last Thursday, and since that time has not really been able to bear weight on left leg. Reports it feels similar to prior right hip fracture. Pain is severe with movement and weightbearing. Also reports hematoma that was present to left tibia 6/24 has increased in size, has become hot and more painful. Reports "it has a fever".  Notes it had drained dark black/blood previously. Is supposed to get dialysis today but hip hurting too much so came to ED.  Past Medical History:  Diagnosis Date  . Abscess of buttock 12/29/2013  . Acute blood loss anemia 03/13/2017  . Acute delirium   . Anemia of renal disease 03/13/2017  . Anemia, chronic renal failure, stage 5 (HCC) 10/23/2016  . Anxiety   . Arthritis    "all over" (01/29/2017)  . Atrial flutter (HCC)   . Chronic back pain    "all my back" (01/29/2017)  . CKD (chronic kidney disease) stage 5, GFR less than 15 ml/min (HCC) 12/29/2013  . Constipation   . Dementia   . Depression   . Displaced fracture of right femoral neck (HCC)   . Elevated troponin   . Encounter for nasogastric (NG) tube placement   . Escherichia coli urinary tract infection 12/29/2013  . Escherichia coli urinary tract infection 12/29/2013  . ESRD (end stage renal disease) on dialysis [facility])    "MWF; Rudene Anda" (01/29/2017)  . ETOHism (HCC)    Quit in the 1980s.  . Gastric ulcer 12/2013.   Prepyloric ulcer. On follow-up EGD in 02/2014 this was 90% healed  . Gastric ulcer with hemorrhage 12/29/2013  . GERD (gastroesophageal reflux disease)   . Headache    as a young woman  . Hemorrhoids   . History of alcohol dependence (HCC) 03/23/2017  . History of gastric ulcer 03/23/2017  . History  of kidney stones   . History of stomach ulcers   . Hypertension   . Left sided chest pain 10/28/2016   See 10/28/16 she describes pleuritic-type pain on the left since a fall approximately 3 weeks ago Apparently she slipped in her tub without cardiac or neurologic prodrome  . Melena 12/29/2013  . Pancreatic cyst 09/2010.   At uncinate process.  Serial CT imaging favors benign process  . Protein-calorie malnutrition, severe 10/25/2016  . SBO (small bowel obstruction) (HCC)   . Unintentional weight loss 10/23/2016  . Upper GI bleed 12/28/2013    Patient Active Problem List   Diagnosis Date Noted  . Anxiety state (neurotic) 03/25/2017  . History of total right hip arthroplasty 03/23/2017  . Impaired mobility and ADLs 03/23/2017  . Lives alone with some help available 03/23/2017  . Tooth pain 03/23/2017  . History of alcohol dependence (HCC) 03/23/2017  . History of gastric ulcer with bleeding 03/23/2017  . Sacral decubitus ulcer, stage II   . Anemia of renal disease 03/13/2017  . Malnutrition of moderate degree 03/11/2017  . ST elevation myocardial infarction (STEMI) (HCC)   . Closed displaced fracture of right femoral neck (HCC)   . Steal syndrome as complication of dialysis access (HCC) 01/31/2017  . ESRD on dialysis (HCC) 01/29/2017  .  Possible Cognitive impairment 10/23/2016  . Pancreatic cyst   . HTN (hypertension) 12/29/2013  . PAF (paroxysmal atrial fibrillation) (HCC) 12/29/2013    Past Surgical History:  Procedure Laterality Date  . ABDOMINAL HYSTERECTOMY    . ANTERIOR APPROACH HEMI HIP ARTHROPLASTY Right 03/13/2017   Procedure: RIGHT TOTAL HIP ARTHROPLASTY;  Surgeon: Samson FredericBrian Swinteck, MD;  Location: [facility] OR;  Service: Orthopedics;  Laterality: Right;  . ARTERIOVENOUS GRAFT PLACEMENT Left 01/29/2017   INSERTION OF ARTERIOVENOUS (AV) GORE-TEX GRAFT  STRETCHED IN LEFT FORE ARM   . AV FISTULA PLACEMENT Left 03/20/2015   Procedure: ARTERIOVENOUS (AV) FISTULA CREATION;  Surgeon:  Chuck Hinthristopher S Dickson, MD;  [facility];  Service: Vascular;  Laterality: Left;  . AV FISTULA PLACEMENT Left 01/29/2017   Procedure: INSERTION OF ARTERIOVENOUS (AV) GORE-TEX GRAFT  STRETCHED IN LEFT FORE ARM;  Surgeon: Chuck Hinthristopher S Dickson, MD;  [facility];  Service: Vascular;  Laterality: Left;  . AVGG REMOVAL Left 01/31/2017   Procedure: REMOVAL OF ARTERIOVENOUS GORETEX GRAFT (AVGG) WITH Vein patching.;  Surgeon: Fransisco HertzBrian L Chen, MD;  [facility];  Service: Vascular;  Laterality: Left;  . CATARACT EXTRACTION Right   . COLONOSCOPY  2010   Per Dr. Loreta AveMann  . ESOPHAGOGASTRODUODENOSCOPY N/A 12/29/2013   Procedure: ESOPHAGOGASTRODUODENOSCOPY (EGD);  Surgeon: Hart Carwinora M Brodie, MD;  [facility];  Service: Endoscopy;  Laterality: N/A;  . EXPLORATORY LAPAROTOMY  1980   w/LOA  . FLEXIBLE SIGMOIDOSCOPY  08/2011   Per Dr. Randa EvensEdwards. Normal study. Random biopsies positive for trace melanosis coli.  Marland Kitchen. FOOT SURGERY Bilateral    "surgery for flat feet"  . INSERTION OF DIALYSIS CATHETER Right 11/25/2016   Procedure: INSERTION OF Right Internal jugular DIALYSIS CATHETER;  Surgeon: Fransisco HertzBrian L Chen, MD;  [facility];  Service: Vascular;  Laterality: Right;  . SHOULDER ARTHROSCOPY W/ ROTATOR CUFF REPAIR Right 08/2003   Hattie Perch/notes 01/29/2017  . TIBIA FRACTURE SURGERY Right    "broke between my knee and my ankle"  . TOTAL ABDOMINAL HYSTERECTOMY W/ BILATERAL SALPINGOOPHORECTOMY  1980  . TUBAL LIGATION      OB History    No data available       Home Medications    Prior to Admission medications   Medication Sig Start Date End Date Taking? Authorizing Provider  acetaminophen (TYLENOL) 325 MG tablet Take 2 tablets (650 mg total) by mouth 3 (three) times daily. 04/15/17   Erasmo DownerBacigalupo, Angela M, MD  aspirin 81 MG EC tablet Take 1 tablet (81 mg total) by mouth daily. 05/27/17   Leland HerYoo, Elsia J, DO  atorvastatin (LIPITOR) 40 MG tablet TAKE 1 TABLET BY MOUTH DAILY AT 6PM 05/27/17   Leland HerYoo, Elsia J, DO    calcitRIOL (ROCALTROL) 0.25 MCG capsule Take 3 capsules (0.75 mcg total) by mouth every Monday, Wednesday, and Friday with hemodialysis. 04/15/17   Erasmo DownerBacigalupo, Angela M, MD  calcium acetate (PHOSLO) 667 MG tablet Take 2 tablets (1,334 mg total) by mouth 3 (three) times daily with meals. 04/15/17   Bacigalupo, Marzella SchleinAngela M, MD  cephALEXin (KEFLEX) 250 MG capsule Take 1 capsule (250 mg total) by mouth daily. 06/15/17 06/22/17  Alvira MondaySchlossman, Colvin Blatt, MD  cholecalciferol (VITAMIN D) 1000 units tablet Take 1 tablet (1,000 Units total) by mouth daily. 05/27/17   Leland HerYoo, Elsia J, DO  clopidogrel (PLAVIX) 75 MG tablet Take 1 tablet (75 mg total) by mouth daily. 05/27/17   Leland HerYoo, Elsia J, DO  metoprolol tartrate (LOPRESSOR) 25 MG tablet Take 1 tablet (25 mg total) by  mouth 2 (two) times daily. 05/27/17   Leland Her, DO  nitroGLYCERIN (NITROSTAT) 0.4 MG SL tablet Place 1 tablet (0.4 mg total) under the tongue every 5 (five) minutes as needed for chest pain. 04/15/17   Bacigalupo, Marzella Schlein, MD  Nutritional Supplements (FEEDING SUPPLEMENT, NEPRO CARB STEADY,) LIQD Take 237 mLs by mouth 2 (two) times daily between meals. 04/15/17   Erasmo Downer, MD  pantoprazole (PROTONIX) 40 MG tablet Take 1 tablet (40 mg total) by mouth daily. 05/27/17   Leland Her, DO  traMADol (ULTRAM) 50 MG tablet Take 0.5 tablets (25 mg total) by mouth every 8 (eight) hours as needed. 05/31/17   Sharlene Dory, DO  venlafaxine XR (EFFEXOR-XR) 75 MG 24 hr capsule Take 1 capsule (75 mg total) by mouth at bedtime. 05/27/17   Leland Her, DO    Family History Family History  Problem Relation Age of Onset  . Alcohol abuse Mother     Social History Social History  Substance Use Topics  . Smoking status: Former Smoker    Packs/day: 0.50    Years: 32.00    Types: Cigarettes    Quit date: 02/14/1986  . Smokeless tobacco: Never Used  . Alcohol use Yes     Comment: 01/29/2017 "used to have a problem w/it; nothing since 1987"     Allergies    Aspirin and Codeine   Review of Systems Review of Systems  Constitutional: Negative for fever.  HENT: Negative for sore throat.   Eyes: Negative for visual disturbance.  Respiratory: Negative for cough and shortness of breath.   Cardiovascular: Negative for chest pain.  Gastrointestinal: Negative for abdominal pain, nausea and vomiting.  Genitourinary: Negative for difficulty urinating.  Musculoskeletal: Positive for arthralgias. Negative for back pain and neck pain.  Skin: Positive for wound. Negative for rash.  Neurological: Negative for syncope and headaches.     Physical Exam Updated Vital Signs BP (!) 170/73   Pulse 74   Temp 98 F (36.7 C) (Oral)   Resp 14   SpO2 100%   Physical Exam  Constitutional: She is oriented to person, place, and time. She appears well-developed and well-nourished. No distress.  HENT:  Head: Normocephalic and atraumatic.  Eyes: Conjunctivae and EOM are normal.  Neck: Normal range of motion.  Cardiovascular: Normal rate, regular rhythm, normal heart sounds and intact distal pulses.  Exam reveals no gallop and no friction rub.   No murmur heard. Pulmonary/Chest: Effort normal and breath sounds normal. No respiratory distress. She has no wheezes. She has no rales.  Abdominal: Soft. She exhibits no distension. There is no tenderness. There is no guarding.  Musculoskeletal: She exhibits no edema or tenderness.  Fluctuant swelling left anterior tibia 4x4, mild overlying erythema, central scab  Neurological: She is alert and oriented to person, place, and time.  Skin: Skin is warm and dry. No rash noted. She is not diaphoretic. No erythema.  Nursing note and vitals reviewed.    ED Treatments / Results  Labs (all labs ordered are listed, but only abnormal results are displayed) Labs Reviewed  CBC WITH DIFFERENTIAL/PLATELET - Abnormal; Notable for the following:       Result Value   RBC 3.82 (*)    Hemoglobin 10.4 (*)    HCT 32.1 (*)     RDW 15.8 (*)    All other components within normal limits  BASIC METABOLIC PANEL - Abnormal; Notable for the following:    CO2 18 (*)  BUN 55 (*)    Creatinine, Ser 9.58 (*)    GFR calc non Af Amer 3 (*)    GFR calc Af Amer 4 (*)    All other components within normal limits    EKG  EKG Interpretation None       Radiology Ct Hip Left Wo Contrast  Result Date: 06/15/2017 CLINICAL DATA:  Status post fall.  Left hip pain. EXAM: CT OF THE LEFT HIP WITHOUT CONTRAST TECHNIQUE: Multidetector CT imaging of the left hip was performed according to the standard protocol. Multiplanar CT image reconstructions were also generated. COMPARISON:  None. FINDINGS: Bones/Joint/Cartilage Generalized osteopenia. Cortical irregularity along the superior femoral head and neck junction concerning for possible nondisplaced subcapital fracture. No other fracture or dislocation. Normal alignment. No joint effusion. Degenerative disc disease with disc height loss at L4-5 and L5-S1. Grade 1 anterolisthesis of L4 on L5 secondary to bilateral facet arthropathy. Bilateral facet arthropathy at L5-S1. Degenerative changes of the pubic symphysis. Ligaments Ligaments are suboptimally evaluated by CT. Muscles and Tendons Muscles are normal.  No muscle atrophy. Soft tissue No fluid collection or hematoma.  No soft tissue mass. IMPRESSION: Cortical irregularity along the superior femoral head and neck junction concerning for possible nondisplaced subcapital fracture. Given the degree of osteopenia, further evaluation with MRI of the left hip is recommended. Electronically Signed   By: Elige Ko   On: 06/15/2017 12:20   Dg Hip Unilat With Pelvis 2-3 Views Left  Result Date: 06/15/2017 CLINICAL DATA:  Anterior left hip pain after fall on Thursday. Initial encounter. EXAM: DG HIP (WITH OR WITHOUT PELVIS) 2-3V LEFT COMPARISON:  None. FINDINGS: No evidence of left hip fracture or malalignment. Bipolar right hip hemiarthroplasty that  is located. Osteopenia. No evidence of pelvic ring fracture, limited at the sacrum due to overlapping bowel gas. IMPRESSION: No acute finding. Electronically Signed   By: Marnee Spring M.D.   On: 06/15/2017 10:31    Procedures .Marland KitchenIncision and Drainage Date/Time: 06/15/2017 11:31 PM Performed by: Alvira Monday Authorized by: Alvira Monday   Consent:    Consent obtained:  Verbal   Consent given by:  Patient   Risks discussed:  Bleeding, infection and incomplete drainage Location:    Type:  Hematoma   Size:  3x3   Location:  Lower extremity   Lower extremity location:  Leg   Leg location:  L lower leg Pre-procedure details:    Skin preparation:  Betadine Anesthesia (see MAR for exact dosages):    Anesthesia method:  Local infiltration   Local anesthetic:  Lidocaine 1% WITH epi Procedure type:    Complexity:  Simple Procedure details:    Needle aspiration: no     Incision types:  Single straight   Scalpel blade:  11   Wound management:  Probed and deloculated   Drainage:  Bloody (blood clots)   Drainage amount:  Moderate   Wound treatment:  Wound left open   Packing materials:  None Post-procedure details:    Patient tolerance of procedure:  Tolerated well, no immediate complications   (including critical care time)  Medications Ordered in ED Medications  lidocaine-EPINEPHrine (XYLOCAINE W/EPI) 2 %-1:200000 (PF) injection 10 mL (10 mLs Intradermal Given by Other 06/15/17 1644)     Initial Impression / Assessment and Plan / ED Course  I have reviewed the triage vital signs and the nursing notes.  Pertinent labs & imaging results that were available during my care of the patient were reviewed by me and  considered in my medical decision making (see chart for details).     83yo female with history of ESRD on dialysis M-W-F presents with concern for left hip pain after fall on Thursday, and continuing lower extremity hematoma with increasing pain.  Concern for possible  abscess formation, however incision and drainge showed coagulated blood which was expressed with improvement in swelling. Pt given keflex rx.   Labs show no sign for need for emergent dialysis. Recommend pt call dialysis center tomorrow to set up appt.  Left hip XR negative. CT shows cortical irregularity which may represent small nondisplaced fracture. Orthopedics was consulted, and rec weight bearing as tolerated and outpt follow up.  Final Clinical Impressions(s) / ED Diagnoses   Final diagnoses:  Fall  Left hip pain  Hematoma  Cellulitis of left lower extremity  Closed fracture of neck of left femur, initial encounter (HCC), possible fracture, very small cortical irregularity    New Prescriptions Discharge Medication List as of 06/15/2017  4:54 PM    START taking these medications   Details  cephALEXin (KEFLEX) 250 MG capsule Take 1 capsule (250 mg total) by mouth daily., Starting Mon 06/15/2017, Until Mon 06/22/2017, Print         Alvira Monday, MD 06/15/17 [id number]

## 2017-06-17 ENCOUNTER — Telehealth: Payer: Self-pay | Admitting: Family Medicine

## 2017-06-17 ENCOUNTER — Other Ambulatory Visit: Payer: Self-pay | Admitting: Family Medicine

## 2017-06-17 NOTE — Telephone Encounter (Signed)
Will forward to MD for verbal ok of orders. Jazmin Hartsell,CMA

## 2017-06-17 NOTE — Telephone Encounter (Signed)
Melissa from [facility] called and is requesting verbal orders to continue with home health. 1 time a week for 6 weeks. Please call [phone] with the verbal orders. jw

## 2017-06-17 NOTE — Telephone Encounter (Signed)
Spoke with Melissa and gave verbal orders. Discussed encouraging patient to come into clinic to establish care since she no showed her appt with me on 06/11/17.

## 2017-06-19 ENCOUNTER — Telehealth: Payer: Self-pay | Admitting: *Deleted

## 2017-06-19 ENCOUNTER — Inpatient Hospital Stay (HOSPITAL_COMMUNITY)
Admission: EM | Admit: 2017-06-19 | Discharge: 2017-06-23 | DRG: 304 | Disposition: A | Discharge status: Home Health | Payer: Medicare Other | Attending: Family Medicine | Admitting: Family Medicine

## 2017-06-19 ENCOUNTER — Encounter (HOSPITAL_COMMUNITY): Payer: Self-pay

## 2017-06-19 DIAGNOSIS — Z90722 Acquired absence of ovaries, bilateral: Secondary | ICD-10-CM

## 2017-06-19 DIAGNOSIS — I10 Essential (primary) hypertension: Secondary | ICD-10-CM

## 2017-06-19 DIAGNOSIS — F411 Generalized anxiety disorder: Secondary | ICD-10-CM | POA: Diagnosis present

## 2017-06-19 DIAGNOSIS — R269 Unspecified abnormalities of gait and mobility: Secondary | ICD-10-CM | POA: Diagnosis present

## 2017-06-19 DIAGNOSIS — I120 Hypertensive chronic kidney disease with stage 5 chronic kidney disease or end stage renal disease: Secondary | ICD-10-CM | POA: Diagnosis present

## 2017-06-19 DIAGNOSIS — I480 Paroxysmal atrial fibrillation: Secondary | ICD-10-CM | POA: Diagnosis present

## 2017-06-19 DIAGNOSIS — N2581 Secondary hyperparathyroidism of renal origin: Secondary | ICD-10-CM | POA: Diagnosis present

## 2017-06-19 DIAGNOSIS — Z992 Dependence on renal dialysis: Secondary | ICD-10-CM

## 2017-06-19 DIAGNOSIS — Z87891 Personal history of nicotine dependence: Secondary | ICD-10-CM | POA: Diagnosis not present

## 2017-06-19 DIAGNOSIS — Z66 Do not resuscitate: Secondary | ICD-10-CM | POA: Diagnosis present

## 2017-06-19 DIAGNOSIS — Z9114 Patient's other noncompliance with medication regimen: Secondary | ICD-10-CM

## 2017-06-19 DIAGNOSIS — Z96641 Presence of right artificial hip joint: Secondary | ICD-10-CM | POA: Diagnosis present

## 2017-06-19 DIAGNOSIS — I161 Hypertensive emergency: Principal | ICD-10-CM | POA: Diagnosis present

## 2017-06-19 DIAGNOSIS — I248 Other forms of acute ischemic heart disease: Secondary | ICD-10-CM | POA: Diagnosis present

## 2017-06-19 DIAGNOSIS — Z811 Family history of alcohol abuse and dependence: Secondary | ICD-10-CM

## 2017-06-19 DIAGNOSIS — F0390 Unspecified dementia without behavioral disturbance: Secondary | ICD-10-CM | POA: Diagnosis present

## 2017-06-19 DIAGNOSIS — K219 Gastro-esophageal reflux disease without esophagitis: Secondary | ICD-10-CM | POA: Diagnosis present

## 2017-06-19 DIAGNOSIS — N186 End stage renal disease: Secondary | ICD-10-CM | POA: Diagnosis present

## 2017-06-19 DIAGNOSIS — R51 Headache: Secondary | ICD-10-CM | POA: Diagnosis present

## 2017-06-19 DIAGNOSIS — Z90710 Acquired absence of both cervix and uterus: Secondary | ICD-10-CM | POA: Diagnosis not present

## 2017-06-19 DIAGNOSIS — Z79899 Other long term (current) drug therapy: Secondary | ICD-10-CM

## 2017-06-19 DIAGNOSIS — Z9119 Patient's noncompliance with other medical treatment and regimen: Secondary | ICD-10-CM

## 2017-06-19 DIAGNOSIS — I959 Hypotension, unspecified: Secondary | ICD-10-CM | POA: Diagnosis present

## 2017-06-19 DIAGNOSIS — E872 Acidosis, unspecified: Secondary | ICD-10-CM

## 2017-06-19 DIAGNOSIS — Z9115 Patient's noncompliance with renal dialysis: Secondary | ICD-10-CM

## 2017-06-19 DIAGNOSIS — E875 Hyperkalemia: Secondary | ICD-10-CM | POA: Diagnosis present

## 2017-06-19 DIAGNOSIS — I252 Old myocardial infarction: Secondary | ICD-10-CM

## 2017-06-19 DIAGNOSIS — N3941 Urge incontinence: Secondary | ICD-10-CM | POA: Diagnosis present

## 2017-06-19 DIAGNOSIS — E871 Hypo-osmolality and hyponatremia: Secondary | ICD-10-CM

## 2017-06-19 DIAGNOSIS — D638 Anemia in other chronic diseases classified elsewhere: Secondary | ICD-10-CM | POA: Diagnosis present

## 2017-06-19 LAB — CBC WITH DIFFERENTIAL/PLATELET
BASOS ABS: 0 10*3/uL (ref 0.0–0.1)
BASOS PCT: 0 %
EOS ABS: 0.1 10*3/uL (ref 0.0–0.7)
Eosinophils Relative: 2 %
HCT: 32.9 % — ABNORMAL LOW (ref 36.0–46.0)
Hemoglobin: 10.4 g/dL — ABNORMAL LOW (ref 12.0–15.0)
Lymphocytes Relative: 38 %
Lymphs Abs: 2.3 10*3/uL (ref 0.7–4.0)
MCH: 26.4 pg (ref 26.0–34.0)
MCHC: 31.6 g/dL (ref 30.0–36.0)
MCV: 83.5 fL (ref 78.0–100.0)
MONO ABS: 0.3 10*3/uL (ref 0.1–1.0)
MONOS PCT: 5 %
NEUTROS ABS: 3.4 10*3/uL (ref 1.7–7.7)
NEUTROS PCT: 55 %
Platelets: 389 10*3/uL (ref 150–400)
RBC: 3.94 MIL/uL (ref 3.87–5.11)
RDW: 16 % — AB (ref 11.5–15.5)
WBC: 6.1 10*3/uL (ref 4.0–10.5)

## 2017-06-19 LAB — COMPREHENSIVE METABOLIC PANEL
ALBUMIN: 3.9 g/dL (ref 3.5–5.0)
ALT: 11 U/L — ABNORMAL LOW (ref 14–54)
ANION GAP: 14 (ref 5–15)
AST: 12 U/L — AB (ref 15–41)
Alkaline Phosphatase: 81 U/L (ref 38–126)
BUN: 78 mg/dL — AB (ref 6–20)
CHLORIDE: 107 mmol/L (ref 101–111)
CO2: 13 mmol/L — AB (ref 22–32)
Calcium: 9.1 mg/dL (ref 8.9–10.3)
Creatinine, Ser: 10.99 mg/dL — ABNORMAL HIGH (ref 0.44–1.00)
GFR calc Af Amer: 3 mL/min — ABNORMAL LOW (ref >60)
GFR calc non Af Amer: 3 mL/min — ABNORMAL LOW (ref >60)
GLUCOSE: 88 mg/dL (ref 65–99)
POTASSIUM: 5.4 mmol/L — AB (ref 3.5–5.1)
SODIUM: 134 mmol/L — AB (ref 135–145)
Total Bilirubin: 0.4 mg/dL (ref 0.3–1.2)
Total Protein: 8 g/dL (ref 6.5–8.1)

## 2017-06-19 MED ORDER — VITAMIN D3 25 MCG (1000 UNIT) PO TABS
1000.0000 [IU] | ORAL_TABLET | Freq: Every day | ORAL | Status: DC
  Administered 2017-06-20 – 2017-06-23 (×4): 1000 [IU] via ORAL
  Filled 2017-06-19 (×8): qty 1

## 2017-06-19 MED ORDER — ACETAMINOPHEN 325 MG PO TABS
650.0000 mg | ORAL_TABLET | Freq: Three times a day (TID) | ORAL | Status: DC
  Administered 2017-06-19 – 2017-06-23 (×10): 650 mg via ORAL
  Filled 2017-06-19 (×10): qty 2

## 2017-06-19 MED ORDER — CALCIUM ACETATE (PHOS BINDER) 667 MG PO CAPS
1334.0000 mg | ORAL_CAPSULE | Freq: Three times a day (TID) | ORAL | Status: DC
  Administered 2017-06-20 – 2017-06-23 (×9): 1334 mg via ORAL
  Filled 2017-06-19 (×10): qty 2

## 2017-06-19 MED ORDER — ASPIRIN EC 81 MG PO TBEC
81.0000 mg | DELAYED_RELEASE_TABLET | Freq: Every day | ORAL | Status: DC
  Administered 2017-06-20 – 2017-06-23 (×4): 81 mg via ORAL
  Filled 2017-06-19 (×5): qty 1

## 2017-06-19 MED ORDER — NITROGLYCERIN 0.4 MG SL SUBL: 0.4000 mg | SUBLINGUAL_TABLET | SUBLINGUAL | Status: DC | PRN

## 2017-06-19 MED ORDER — METOPROLOL TARTRATE 25 MG PO TABS
25.0000 mg | ORAL_TABLET | Freq: Two times a day (BID) | ORAL | Status: DC
  Administered 2017-06-19 – 2017-06-23 (×7): 25 mg via ORAL
  Filled 2017-06-19 (×7): qty 1

## 2017-06-19 MED ORDER — SODIUM POLYSTYRENE SULFONATE 15 GM/60ML PO SUSP
30.0000 g | Freq: Once | ORAL | Status: AC
  Administered 2017-06-19: 30 g via ORAL
  Filled 2017-06-19: qty 120

## 2017-06-19 MED ORDER — CALCIUM ACETATE (PHOS BINDER) 667 MG PO TABS: 1334.0000 mg | ORAL_TABLET | Freq: Three times a day (TID) | ORAL | Status: DC

## 2017-06-19 MED ORDER — NEPRO/CARBSTEADY PO LIQD
237.0000 mL | Freq: Two times a day (BID) | ORAL | Status: DC
  Administered 2017-06-21 – 2017-06-22 (×2): 237 mL via ORAL

## 2017-06-19 MED ORDER — ATORVASTATIN CALCIUM 40 MG PO TABS
40.0000 mg | ORAL_TABLET | Freq: Every day | ORAL | Status: DC
  Administered 2017-06-19 – 2017-06-21 (×3): 40 mg via ORAL
  Filled 2017-06-19 (×3): qty 1

## 2017-06-19 MED ORDER — CALCITRIOL 0.5 MCG PO CAPS
0.7500 ug | ORAL_CAPSULE | ORAL | Status: DC
  Administered 2017-06-20: 13:00:00 0.75 ug via ORAL
  Filled 2017-06-19: qty 1

## 2017-06-19 MED ORDER — VENLAFAXINE HCL ER 75 MG PO CP24
75.0000 mg | ORAL_CAPSULE | Freq: Every day | ORAL | Status: DC
  Administered 2017-06-19 – 2017-06-22 (×4): 75 mg via ORAL
  Filled 2017-06-19 (×6): qty 1

## 2017-06-19 MED ORDER — HEPARIN SODIUM (PORCINE) 5000 UNIT/ML IJ SOLN
5000.0000 [IU] | Freq: Three times a day (TID) | INTRAMUSCULAR | Status: DC
  Administered 2017-06-19 – 2017-06-23 (×10): 5000 [IU] via SUBCUTANEOUS
  Filled 2017-06-19 (×11): qty 1

## 2017-06-19 MED ORDER — CLOPIDOGREL BISULFATE 75 MG PO TABS
75.0000 mg | ORAL_TABLET | Freq: Every day | ORAL | Status: DC
  Administered 2017-06-19 – 2017-06-23 (×4): 75 mg via ORAL
  Filled 2017-06-19 (×4): qty 1

## 2017-06-19 MED ORDER — SODIUM CHLORIDE 0.9% FLUSH
3.0000 mL | Freq: Two times a day (BID) | INTRAVENOUS | Status: DC
  Administered 2017-06-19 – 2017-06-23 (×6): 3 mL via INTRAVENOUS

## 2017-06-19 MED ORDER — ASPIRIN 81 MG PO TBEC: 81.0000 mg | DELAYED_RELEASE_TABLET | Freq: Every day | ORAL | Status: DC

## 2017-06-19 NOTE — Telephone Encounter (Signed)
Will forward to MD. Elspeth Blucher,CMA  

## 2017-06-19 NOTE — ED Notes (Signed)
Attempted report 

## 2017-06-19 NOTE — H&P (Signed)
Family Medicine Teaching [facility] Admission History and Physical Service Pager: [phone]  Patient name: Monica [name] Medical record number: [id number] Date of birth: September 16, 1932 Age: 83 y.o. Gender: female  Primary Care Provider: Leland Her, DO Consultants: nephrology Code Status: full  Chief Complaint: "my vision went black"  Assessment and Plan: Monica [name] is a 83 y.o. female with PMH of hypertension, esrd, anemia of chronic disease, displaced fx R femoral neck, h/o etoh dependence, gastric ulcer, anxiety presenting with hypertensive urgency, vision disturbance, headache, hyperkalemia. Patient with poor compliance with medications over last couple of weeks due to paranoia about medications being poisoned. Has also stopped going to dialysis due to transportation issues. Presented with SBP in 190s, K of 5.4. Plan for dialysis in am.  Hypertensive Emergency Patient with systolic in 200s at home, 190s in ED with persistent headache and possible vision changes (patient initially reports and then denies). Per chart review the patient's recent baseline is 170 systolic. Takes metoprolol at home, unclear if been taking. Plan to continue metoprolol while admission. Per nephrology will get dialysis in am which will likely also improve pressure.  Can also add on prn hydralazine as needed.  - admit for inpatient to Dr. Pollie Meyer, [facility] Medicine Teaching Service - Continue metoprolol 25mg  BID - Dialysis on 7/14 per nephrology, appreciate recs - PRN hydralazine for sbp>180s - vitals per floor, trend blood pressures  Hyperkalemia As noted above patient missed several dialysis sessions. Potassium of 5.4 on admission. Nephrology consulted from ED and plan to do HD 7/14. Patient is able to make stool and is not significantly altered. Will give kayexalate for potassium binding and excretion. BMP in am.  - Dialysis on 7/14 - kayexalate for potassium excretion - daily renal function panel -  consider calcium gluconate if continues to elevate or new chest pain/tele changes  ESRD Patient with ESRD and has missed last several dialysis sessions over last 9 days. Nephrology on board, appreciate their recs. Currently dialyzes through a L IJ permcath. Will be getting dialysis on 7/14. - nephro following, appreciate recs, anticipate HD in am  Impaired Mobility Able to ambulate with walker at home. Will consult pt/ot for help with ambulation if stays for extended time. - PT/OT consult  History of ETOH dependence Claims to have not drunk any etoh since 1970s. Can follow for s/s of withdrawal in event patient not being truthful, no CIWA necessary at this time. - clinically monitor  Dispo Family and caregivers with concern regarding safety of living alone. Consider family meeting.  - pending PT/OT recs  FEN/GI: renal diet Prophylaxis: heparin  Disposition: home vs assisted living  History of Present Illness:  Monica [name] is a 82 y.o. female presenting with hypertensive emergency and hyperkalemia. In the afternoon of 7/13 she reports that she started seeing black when she had been "running her mouth." She does not describe the sensation as a being dizzy. She said that she sat down and her vision slowly went back to baseline. At that time her [facility] RN checked her BP and it was >200/110. She reports a similar sensation in the past when they have "taken too much fluid off at dialysis". At that time her [facility] nurse called EMS and brought her to the ed. Upon arrival to the ed she states that her symptoms have largely resolved and that she feels "back to normal." Blood pressure has remained elevated in the ed, with systolic of 190s during interview. She states that she has  had a dull headache that comes and goes over the last 3 days. Patient with potassium of 5.4 at presentation to ed, bun 78, cr 11. Patient dialyzes through L IJ perm cath that was apparently placed in December.  Patient is supposed to  get dialysis three times per week but has not been to dialysis in 9 days. She states that she did not have a ride to any of these appointments. Initially she states that she takes some of her medications; saying that "she takes whatever her [facility] nurse gives her." Upon further questioning she says that she usually doesn't take any of her medications because she thinks they might poison her.  Patient is minimally ambulatory at home, says that she uses a walker as well as several other forms of DME to get around. She denies any fevers, chills, sweats. Is able to make her own urine but is incontinent and says that she has sudden urgency resulting in her continually soiling herself. Denies N/V/D. Endorses constipation but uses prunes. Denies fevers, chills. Does make urine. Has urge incontinence. Denies cough. Endorses poor appetite.   In the ED, labs with mild hyperkalemia, stable anemia. EKG with mildly peaked T waves, LVH. Nephro consulted who recommended HD in am.   Review Of Systems: Per HPI  ROS  Patient Active Problem List   Diagnosis Date Noted  . End stage renal disease (HCC) 06/19/2017  . Anxiety state (neurotic) 03/25/2017  . History of total right hip arthroplasty 03/23/2017  . Impaired mobility and ADLs 03/23/2017  . Lives alone with some help available 03/23/2017  . Tooth pain 03/23/2017  . History of alcohol dependence (HCC) 03/23/2017  . History of gastric ulcer with bleeding 03/23/2017  . Sacral decubitus ulcer, stage II   . Anemia of renal disease 03/13/2017  . Malnutrition of moderate degree 03/11/2017  . ST elevation myocardial infarction (STEMI) (HCC)   . Closed displaced fracture of right femoral neck (HCC)   . Steal syndrome as complication of dialysis access (HCC) 01/31/2017  . ESRD on dialysis (HCC) 01/29/2017  . Possible Cognitive impairment 10/23/2016  . Pancreatic cyst   . HTN (hypertension) 12/29/2013  . PAF (paroxysmal atrial fibrillation) (HCC) 12/29/2013     Past Medical History: Past Medical History:  Diagnosis Date  . Abscess of buttock 12/29/2013  . Acute blood loss anemia 03/13/2017  . Acute delirium   . Anemia of renal disease 03/13/2017  . Anemia, chronic renal failure, stage 5 (HCC) 10/23/2016  . Anxiety   . Arthritis    "all over" (01/29/2017)  . Atrial flutter (HCC)   . Chronic back pain    "all my back" (01/29/2017)  . CKD (chronic kidney disease) stage 5, GFR less than 15 ml/min (HCC) 12/29/2013  . Constipation   . Dementia   . Depression   . Displaced fracture of right femoral neck (HCC)   . Elevated troponin   . Encounter for nasogastric (NG) tube placement   . Escherichia coli urinary tract infection 12/29/2013  . Escherichia coli urinary tract infection 12/29/2013  . ESRD (end stage renal disease) on dialysis [facility])    "MWF; Rudene Anda" (01/29/2017)  . ETOHism (HCC)    Quit in the 1980s.  . Gastric ulcer 12/2013.   Prepyloric ulcer. On follow-up EGD in 02/2014 this was 90% healed  . Gastric ulcer with hemorrhage 12/29/2013  . GERD (gastroesophageal reflux disease)   . Headache    as a young woman  . Hemorrhoids   .  History of alcohol dependence (HCC) 03/23/2017  . History of gastric ulcer 03/23/2017  . History of kidney stones   . History of stomach ulcers   . Hypertension   . Left sided chest pain 10/28/2016   See 10/28/16 she describes pleuritic-type pain on the left since a fall approximately 3 weeks ago Apparently she slipped in her tub without cardiac or neurologic prodrome  . Melena 12/29/2013  . Pancreatic cyst 09/2010.   At uncinate process.  Serial CT imaging favors benign process  . Protein-calorie malnutrition, severe 10/25/2016  . SBO (small bowel obstruction) (HCC)   . Unintentional weight loss 10/23/2016  . Upper GI bleed 12/28/2013    Past Surgical History: Past Surgical History:  Procedure Laterality Date  . ABDOMINAL HYSTERECTOMY    . ANTERIOR APPROACH HEMI HIP ARTHROPLASTY Right 03/13/2017    Procedure: RIGHT TOTAL HIP ARTHROPLASTY;  Surgeon: Samson FredericBrian Swinteck, MD;  Location: [facility] OR;  Service: Orthopedics;  Laterality: Right;  . ARTERIOVENOUS GRAFT PLACEMENT Left 01/29/2017   INSERTION OF ARTERIOVENOUS (AV) GORE-TEX GRAFT  STRETCHED IN LEFT FORE ARM   . AV FISTULA PLACEMENT Left 03/20/2015   Procedure: ARTERIOVENOUS (AV) FISTULA CREATION;  Surgeon: Chuck Hinthristopher S Dickson, MD;  [facility];  Service: Vascular;  Laterality: Left;  . AV FISTULA PLACEMENT Left 01/29/2017   Procedure: INSERTION OF ARTERIOVENOUS (AV) GORE-TEX GRAFT  STRETCHED IN LEFT FORE ARM;  Surgeon: Chuck Hinthristopher S Dickson, MD;  [facility];  Service: Vascular;  Laterality: Left;  . AVGG REMOVAL Left 01/31/2017   Procedure: REMOVAL OF ARTERIOVENOUS GORETEX GRAFT (AVGG) WITH Vein patching.;  Surgeon: Fransisco HertzBrian L Chen, MD;  [facility];  Service: Vascular;  Laterality: Left;  . CATARACT EXTRACTION Right   . COLONOSCOPY  2010   Per Dr. Loreta AveMann  . ESOPHAGOGASTRODUODENOSCOPY N/A 12/29/2013   Procedure: ESOPHAGOGASTRODUODENOSCOPY (EGD);  Surgeon: Hart Carwinora M Brodie, MD;  [facility];  Service: Endoscopy;  Laterality: N/A;  . EXPLORATORY LAPAROTOMY  1980   w/LOA  . FLEXIBLE SIGMOIDOSCOPY  08/2011   Per Dr. Randa EvensEdwards. Normal study. Random biopsies positive for trace melanosis coli.  Marland Kitchen. FOOT SURGERY Bilateral    "surgery for flat feet"  . INSERTION OF DIALYSIS CATHETER Right 11/25/2016   Procedure: INSERTION OF Right Internal jugular DIALYSIS CATHETER;  Surgeon: Fransisco HertzBrian L Chen, MD;  [facility];  Service: Vascular;  Laterality: Right;  . SHOULDER ARTHROSCOPY W/ ROTATOR CUFF REPAIR Right 08/2003   Hattie Perch/notes 01/29/2017  . TIBIA FRACTURE SURGERY Right    "broke between my knee and my ankle"  . TOTAL ABDOMINAL HYSTERECTOMY W/ BILATERAL SALPINGOOPHORECTOMY  1980  . TUBAL LIGATION      Social History: Social History  Substance Use Topics  . Smoking status: Former Smoker    Packs/day: 0.50    Years: 32.00    Types: Cigarettes     Quit date: 02/14/1986  . Smokeless tobacco: Never Used  . Alcohol use Yes     Comment: 01/29/2017 "used to have a problem w/it; nothing since 1987"   Additional social history: Hx of tobacco use - 15 pack year history; no longer drinks alcohol Please also refer to relevant sections of EMR.  Family History: Family History  Problem Relation Age of Onset  . Alcohol abuse Mother     Allergies and Medications: Allergies  Allergen Reactions  . Aspirin Other (See Comments)    "makes my stomach hurt"  . Codeine Other (See Comments)    "i get irritated" Patient tolerates Tramadol without difficulty.  No current facility-administered medications on file prior to encounter.    Current Outpatient Prescriptions on File Prior to Encounter  Medication Sig Dispense Refill  . acetaminophen (TYLENOL) 325 MG tablet Take 2 tablets (650 mg total) by mouth 3 (three) times daily. (Patient not taking: Reported on 06/19/2017) 180 tablet 0  . aspirin 81 MG EC tablet Take 1 tablet (81 mg total) by mouth daily. (Patient not taking: Reported on 06/19/2017) 30 tablet 0  . atorvastatin (LIPITOR) 40 MG tablet Take 1 tablet (40 mg total) by mouth daily. (Patient not taking: Reported on 06/19/2017) 30 tablet 2  . calcitRIOL (ROCALTROL) 0.25 MCG capsule Take 3 capsules (0.75 mcg total) by mouth every Monday, Wednesday, and Friday with hemodialysis. (Patient not taking: Reported on 06/19/2017) 40 capsule 0  . calcium acetate (PHOSLO) 667 MG tablet Take 2 tablets (1,334 mg total) by mouth 3 (three) times daily with meals. (Patient not taking: Reported on 06/19/2017) 180 tablet 0  . cephALEXin (KEFLEX) 250 MG capsule Take 1 capsule (250 mg total) by mouth daily. (Patient not taking: Reported on 06/19/2017) 7 capsule 0  . cholecalciferol (VITAMIN D) 1000 units tablet Take 1 tablet (1,000 Units total) by mouth daily. (Patient not taking: Reported on 06/19/2017) 30 tablet 0  . clopidogrel (PLAVIX) 75 MG tablet Take 1 tablet  (75 mg total) by mouth daily. (Patient not taking: Reported on 06/19/2017) 30 tablet 0  . metoprolol tartrate (LOPRESSOR) 25 MG tablet Take 1 tablet (25 mg total) by mouth 2 (two) times daily. (Patient not taking: Reported on 06/19/2017) 60 tablet 0  . nitroGLYCERIN (NITROSTAT) 0.4 MG SL tablet Place 1 tablet (0.4 mg total) under the tongue every 5 (five) minutes as needed for chest pain. (Patient not taking: Reported on 06/19/2017) 30 tablet 0  . Nutritional Supplements (FEEDING SUPPLEMENT, NEPRO CARB STEADY,) LIQD Take 237 mLs by mouth 2 (two) times daily between meals. (Patient not taking: Reported on 06/19/2017) 60 Can 0  . pantoprazole (PROTONIX) 40 MG tablet Take 1 tablet (40 mg total) by mouth daily. 30 tablet 0  . traMADol (ULTRAM) 50 MG tablet Take 0.5 tablets (25 mg total) by mouth every 8 (eight) hours as needed. (Patient not taking: Reported on 06/19/2017) 15 tablet 0  . venlafaxine XR (EFFEXOR-XR) 75 MG 24 hr capsule Take 1 capsule (75 mg total) by mouth at bedtime. (Patient not taking: Reported on 06/19/2017) 30 capsule 0    Objective: BP (!) 186/73   Pulse (!) 57   Temp 97.9 F (36.6 C) (Oral)   Resp 17   Ht 5\' 1"  (1.549 m)   Wt 120 lb (54.4 kg)   SpO2 100%   BMI 22.67 kg/m  Exam: General: alert, oriented x3, no acute distress Eyes: EOMI, PERRL ENTM: nose, bilateral ears with no external signs of trauma Neck: supple Cardiovascular: rrr, no murmurs, gallops, rubs. Palpable pt/dp bilaterally, palpable radial pulse bilaterally. L IJ permcath in place. Respiratory: lungs clear to ausculation bilaterally, no pleuritic chest pain Gastrointestinal: soft, non-tender, non-distended. Surgical scar below umbilicus noted MSK: able to move all four extremities, deformity involving right leg with likely healed displaced fracture Derm: warm, dry, no rashes Neuro: AOx4, no neuro deficits noted Psych: appropriate  Labs and Imaging: CBC BMET   Recent Labs Lab 06/19/17 1524  WBC 6.1   HGB 10.4*  HCT 32.9*  PLT 389    Recent Labs Lab 06/19/17 1524  NA 134*  K 5.4*  CL 107  CO2 13*  BUN 78*  CREATININE 10.99*  GLUCOSE 88  CALCIUM 9.1      Myrene Buddy MD 06/19/2017, 7:21 PM PGY-1, [facility] Family Medicine FPTS Intern pager: [phone], text pages welcome  FPTS Upper-Level Resident Addendum  I have independently interviewed and examined the patient. I have discussed the above with the original author and agree with their documentation. My edits for correction/addition/clarification are in blue. Please see also any attending notes.   Loni Muse, MD PGY-2, [place] [facility] FPTS Service pager: [phone] (text pages welcome through [facility])

## 2017-06-19 NOTE — Telephone Encounter (Signed)
Algis GreenhouseMary Holden, RN with [facility] called to report that patient has not been taking her medications. Pt has not been to dialysis since 06/10/2017. Pt's blood pressure 212/115; metoprolol 25 mg was given but blood pressure didn't decrease. Corrie DandyMary was calling EMS to transport patient to ED.   Corrie DandyMary also is requesting verbal orders for medical social worker, home health aid and physical therapy. Pt is living alone. Please give her a call at [phone].    Clovis PuMartin, Tamika L, RN

## 2017-06-19 NOTE — ED Triage Notes (Signed)
PER EMS: pt brought here with c/o HTN. She was seen here last week for a hematoma to her left leg. She lost her medications so she has not taken any of her home meds since July 9th. Home health nurse called her doctor who told pt to come to the ER. Pt also has not been to dialysis since Juyl 4th due to lack of transportation. BP this morning 212/115, was given her meds by her nurse and EMS got a  BP of 172 palpated, HR-60, RR-18, CBG-88. Pt verbalizes no complaints.

## 2017-06-19 NOTE — Progress Notes (Signed)
FPTS PCP Social Note  Visited with Jodi MourningMary Melkonian socially. Patient was unaware that I was her PCP and that I been following her care peripherally. Discussed following up with me in clinic after hospitalization to establish care. Sister at bedside aware and stated she would help get patient in to clinic. Appreciate excellent care being provided by FPTS.   Leland HerYoo, Sussie Minor J, DO 06/19/2017, 8:50 PM PGY-2, [facility] Service pager [phone]
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
## 2017-06-19 NOTE — ED Provider Notes (Signed)
[facility]-EMERGENCY DEPT Provider Note   CSN: [id number] Arrival date & time: 06/19/17  1437     History   Chief Complaint Chief Complaint  Patient presents with  . Hypertension    HPI Monica [name] is a 83 y.o. female.  Patient is an 83 year old female with history of hypertension, end-stage renal disease on hemodialysis presenting with complaints of elevated blood pressure. She tells me she has not had transportation to go to dialysis since last Wednesday (this was 9 days ago). She has been checking her blood pressure at home and it has been over 200. She also tells me that she ran out of her medications and has not been taking these in one week. She denies any chest pain or difficulty breathing. She does report some generalized malaise, however no specific aches or pains.   The history is provided by the patient.  Hypertension  This is a new problem. The current episode started 2 days ago. The problem occurs constantly. The problem has been rapidly worsening. Pertinent negatives include no chest pain, no headaches and no shortness of breath. Nothing aggravates the symptoms. Nothing relieves the symptoms. She has tried nothing for the symptoms.    Past Medical History:  Diagnosis Date  . Abscess of buttock 12/29/2013  . Acute blood loss anemia 03/13/2017  . Acute delirium   . Anemia of renal disease 03/13/2017  . Anemia, chronic renal failure, stage 5 (HCC) 10/23/2016  . Anxiety   . Arthritis    "all over" (01/29/2017)  . Atrial flutter (HCC)   . Chronic back pain    "all my back" (01/29/2017)  . CKD (chronic kidney disease) stage 5, GFR less than 15 ml/min (HCC) 12/29/2013  . Constipation   . Dementia   . Depression   . Displaced fracture of right femoral neck (HCC)   . Elevated troponin   . Encounter for nasogastric (NG) tube placement   . Escherichia coli urinary tract infection 12/29/2013  . Escherichia coli urinary tract infection 12/29/2013  . ESRD (end stage renal disease) on  dialysis [facility](HCC)    "MWF; [address]" (01/29/2017)  . ETOHism (HCC)    Quit in the 1980s.  . Gastric ulcer 12/2013.   Prepyloric ulcer. On follow-up EGD in 02/2014 this was 90% healed  . Gastric ulcer with hemorrhage 12/29/2013  . GERD (gastroesophageal reflux disease)   . Headache    as a young woman  . Hemorrhoids   . History of alcohol dependence (HCC) 03/23/2017  . History of gastric ulcer 03/23/2017  . History of kidney stones   . History of stomach ulcers   . Hypertension   . Left sided chest pain 10/28/2016   See 10/28/16 she describes pleuritic-type pain on the left since a fall approximately 3 weeks ago Apparently she slipped in her tub without cardiac or neurologic prodrome  . Melena 12/29/2013  . Pancreatic cyst 09/2010.   At uncinate process.  Serial CT imaging favors benign process  . Protein-calorie malnutrition, severe 10/25/2016  . SBO (small bowel obstruction) (HCC)   . Unintentional weight loss 10/23/2016  . Upper GI bleed 12/28/2013    Patient Active Problem List   Diagnosis Date Noted  . Anxiety state (neurotic) 03/25/2017  . History of total right hip arthroplasty 03/23/2017  . Impaired mobility and ADLs 03/23/2017  . Lives alone with some help available 03/23/2017  . Tooth pain 03/23/2017  . History of alcohol dependence (HCC) 03/23/2017  . History of gastric ulcer with  bleeding 03/23/2017  . Sacral decubitus ulcer, stage II   . Anemia of renal disease 03/13/2017  . Malnutrition of moderate degree 03/11/2017  . ST elevation myocardial infarction (STEMI) (HCC)   . Closed displaced fracture of right femoral neck (HCC)   . Steal syndrome as complication of dialysis access (HCC) 01/31/2017  . ESRD on dialysis (HCC) 01/29/2017  . Possible Cognitive impairment 10/23/2016  . Pancreatic cyst   . HTN (hypertension) 12/29/2013  . PAF (paroxysmal atrial fibrillation) (HCC) 12/29/2013    Past Surgical History:  Procedure Laterality Date  . ABDOMINAL HYSTERECTOMY     . ANTERIOR APPROACH HEMI HIP ARTHROPLASTY Right 03/13/2017   Procedure: RIGHT TOTAL HIP ARTHROPLASTY;  Surgeon: Samson Frederic, MD;  Location: [facility] OR;  Service: Orthopedics;  Laterality: Right;  . ARTERIOVENOUS GRAFT PLACEMENT Left 01/29/2017   INSERTION OF ARTERIOVENOUS (AV) GORE-TEX GRAFT  STRETCHED IN LEFT FORE ARM   . AV FISTULA PLACEMENT Left 03/20/2015   Procedure: ARTERIOVENOUS (AV) FISTULA CREATION;  Surgeon: Chuck Hint, MD;  Location[facility] OR;  Service: Vascular;  Laterality: Left;  . AV FISTULA PLACEMENT Left 01/29/2017   Procedure: INSERTION OF ARTERIOVENOUS (AV) GORE-TEX GRAFT  STRETCHED IN LEFT FORE ARM;  Surgeon: Chuck Hint, MD;  Location: [facility] OR;  Service: Vascular;  Laterality: Left;  . AVGG REMOVAL Left 01/31/2017   Procedure: REMOVAL OF ARTERIOVENOUS GORETEX GRAFT (AVGG) WITH Vein patching.;  Surgeon: Fransisco Hertz, MD;  Location: [facility] OR;  Service: Vascular;  Laterality: Left;  . CATARACT EXTRACTION Right   . COLONOSCOPY  2010   Per Dr. Loreta Ave  . ESOPHAGOGASTRODUODENOSCOPY N/A 12/29/2013   Procedure: ESOPHAGOGASTRODUODENOSCOPY (EGD);  Surgeon: Hart Carwin, MD;  Location: [facility] ENDOSCOPY;  Service: Endoscopy;  Laterality: N/A;  . EXPLORATORY LAPAROTOMY  1980   w/LOA  . FLEXIBLE SIGMOIDOSCOPY  08/2011   Per Dr. Randa Evens. Normal study. Random biopsies positive for trace melanosis coli.  Marland Kitchen FOOT SURGERY Bilateral    "surgery for flat feet"  . INSERTION OF DIALYSIS CATHETER Right 11/25/2016   Procedure: INSERTION OF Right Internal jugular DIALYSIS CATHETER;  Surgeon: Fransisco Hertz, MD;  Location: [facility] OR;  Service: Vascular;  Laterality: Right;  . SHOULDER ARTHROSCOPY W/ ROTATOR CUFF REPAIR Right 08/2003   Hattie Perch 01/29/2017  . TIBIA FRACTURE SURGERY Right    "broke between my knee and my ankle"  . TOTAL ABDOMINAL HYSTERECTOMY W/ BILATERAL SALPINGOOPHORECTOMY  1980  . TUBAL LIGATION      OB History    No data available       Home Medications    Prior to Admission  medications   Medication Sig Start Date End Date Taking? Authorizing Provider  acetaminophen (TYLENOL) 325 MG tablet Take 2 tablets (650 mg total) by mouth 3 (three) times daily. Patient not taking: Reported on 06/19/2017 04/15/17   Erasmo Downer, MD  aspirin 81 MG EC tablet Take 1 tablet (81 mg total) by mouth daily. Patient not taking: Reported on 06/19/2017 05/27/17   Leland Her, DO  atorvastatin (LIPITOR) 40 MG tablet Take 1 tablet (40 mg total) by mouth daily. Patient not taking: Reported on 06/19/2017 06/18/17   Leland Her, DO  calcitRIOL (ROCALTROL) 0.25 MCG capsule Take 3 capsules (0.75 mcg total) by mouth every Monday, Wednesday, and Friday with hemodialysis. Patient not taking: Reported on 06/19/2017 04/15/17   Erasmo Downer, MD  calcium acetate (PHOSLO) 667 MG tablet Take 2 tablets (1,334 mg total) by mouth 3 (three) times daily with  meals. Patient not taking: Reported on 06/19/2017 04/15/17   Erasmo Downer, MD  cephALEXin (KEFLEX) 250 MG capsule Take 1 capsule (250 mg total) by mouth daily. Patient not taking: Reported on 06/19/2017 06/15/17 06/22/17  Alvira Monday, MD  cholecalciferol (VITAMIN D) 1000 units tablet Take 1 tablet (1,000 Units total) by mouth daily. Patient not taking: Reported on 06/19/2017 05/27/17   Leland Her, DO  clopidogrel (PLAVIX) 75 MG tablet Take 1 tablet (75 mg total) by mouth daily. Patient not taking: Reported on 06/19/2017 05/27/17   Leland Her, DO  metoprolol tartrate (LOPRESSOR) 25 MG tablet Take 1 tablet (25 mg total) by mouth 2 (two) times daily. Patient not taking: Reported on 06/19/2017 05/27/17   Leland Her, DO  nitroGLYCERIN (NITROSTAT) 0.4 MG SL tablet Place 1 tablet (0.4 mg total) under the tongue every 5 (five) minutes as needed for chest pain. Patient not taking: Reported on 06/19/2017 04/15/17   Erasmo Downer, MD  Nutritional Supplements (FEEDING SUPPLEMENT, NEPRO CARB STEADY,) LIQD Take 237 mLs by mouth 2 (two) times daily  between meals. Patient not taking: Reported on 06/19/2017 04/15/17   Erasmo Downer, MD  pantoprazole (PROTONIX) 40 MG tablet Take 1 tablet (40 mg total) by mouth daily. 05/27/17   Leland Her, DO  traMADol (ULTRAM) 50 MG tablet Take 0.5 tablets (25 mg total) by mouth every 8 (eight) hours as needed. Patient not taking: Reported on 06/19/2017 05/31/17   Sharlene Dory, DO  venlafaxine XR (EFFEXOR-XR) 75 MG 24 hr capsule Take 1 capsule (75 mg total) by mouth at bedtime. Patient not taking: Reported on 06/19/2017 05/27/17   Leland Her, DO    Family History Family History  Problem Relation Age of Onset  . Alcohol abuse Mother     Social History Social History  Substance Use Topics  . Smoking status: Former Smoker    Packs/day: 0.50    Years: 32.00    Types: Cigarettes    Quit date: 02/14/1986  . Smokeless tobacco: Never Used  . Alcohol use Yes     Comment: 01/29/2017 "used to have a problem w/it; nothing since 1987"     Allergies   Aspirin and Codeine   Review of Systems Review of Systems  Respiratory: Negative for shortness of breath.   Cardiovascular: Negative for chest pain.  Neurological: Negative for headaches.  All other systems reviewed and are negative.    Physical Exam Updated Vital Signs BP (!) 183/74 (BP Location: Right Arm)   Pulse (!) 57   Temp 97.9 F (36.6 C) (Oral)   Resp 18   Ht 5\' 1"  (1.549 m)   Wt 54.4 kg (120 lb)   SpO2 100%   BMI 22.67 kg/m   Physical Exam  Constitutional: She is oriented to person, place, and time. She appears well-developed and well-nourished. No distress.  HENT:  Head: Normocephalic and atraumatic.  Neck: Normal range of motion. Neck supple.  Cardiovascular: Normal rate and regular rhythm.  Exam reveals no gallop and no friction rub.   No murmur heard. Pulmonary/Chest: Effort normal and breath sounds normal. No respiratory distress. She has no wheezes.  Abdominal: Soft. Bowel sounds are normal. She exhibits  no distension. There is no tenderness.  Musculoskeletal: Normal range of motion.  Neurological: She is alert and oriented to person, place, and time. No cranial nerve deficit. She exhibits normal muscle tone. Coordination normal.  Skin: Skin is warm and dry. She is not diaphoretic.  Nursing note and vitals reviewed.    ED Treatments / Results  Labs (all labs ordered are listed, but only abnormal results are displayed) Labs Reviewed  CBC WITH DIFFERENTIAL/PLATELET - Abnormal; Notable for the following:       Result Value   Hemoglobin 10.4 (*)    HCT 32.9 (*)    RDW 16.0 (*)    All other components within normal limits  COMPREHENSIVE METABOLIC PANEL - Abnormal; Notable for the following:    Sodium 134 (*)    Potassium 5.4 (*)    CO2 13 (*)    BUN 78 (*)    Creatinine, Ser 10.99 (*)    AST 12 (*)    ALT 11 (*)    GFR calc non Af Amer 3 (*)    GFR calc Af Amer 3 (*)    All other components within normal limits    EKG  EKG Interpretation None       Radiology No results found.  Procedures Procedures (including critical care time)  Medications Ordered in ED Medications - No data to display   Initial Impression / Assessment and Plan / ED Course  I have reviewed the triage vital signs and the nursing notes.  Pertinent labs & imaging results that were available during my care of the patient were reviewed by me and considered in my medical decision making (see chart for details).  Patient with history of end-stage renal disease on hemodialysis presenting with elevated blood pressure and malaise. She has not been to dialysis in 9 days and has not taken any of her medications in one week. She states that she has no transportation to get to her dialysis or to her doctor for prescription refills.  She is somewhat hypertensive in the emergency department. Laboratory studies revealed hyperkalemia with a potassium of 5.4, however no EKG changes. She is also acidotic with a CO2 of  13.  I have discussed these findings with Dr. Briant Cedar from nephrology who is recommending admission overnight and dialysis in the morning. I've spoken with family practice who will evaluate and admit the patient.  Final Clinical Impressions(s) / ED Diagnoses   Final diagnoses:  None    New Prescriptions New Prescriptions   No medications on file     Geoffery Lyons, MD 06/19/17 1944

## 2017-06-20 DIAGNOSIS — N186 End stage renal disease: Secondary | ICD-10-CM

## 2017-06-20 DIAGNOSIS — E875 Hyperkalemia: Secondary | ICD-10-CM

## 2017-06-20 DIAGNOSIS — I10 Essential (primary) hypertension: Secondary | ICD-10-CM

## 2017-06-20 LAB — RENAL FUNCTION PANEL
ANION GAP: 15 (ref 5–15)
Albumin: 3.8 g/dL (ref 3.5–5.0)
BUN: 79 mg/dL — ABNORMAL HIGH (ref 6–20)
CO2: 14 mmol/L — AB (ref 22–32)
Calcium: 9.2 mg/dL (ref 8.9–10.3)
Chloride: 108 mmol/L (ref 101–111)
Creatinine, Ser: 11.01 mg/dL — ABNORMAL HIGH (ref 0.44–1.00)
GFR calc Af Amer: 3 mL/min — ABNORMAL LOW (ref >60)
GFR calc non Af Amer: 3 mL/min — ABNORMAL LOW (ref >60)
Glucose, Bld: 104 mg/dL — ABNORMAL HIGH (ref 65–99)
POTASSIUM: 4.7 mmol/L (ref 3.5–5.1)
Phosphorus: 6.2 mg/dL — ABNORMAL HIGH (ref 2.5–4.6)
Sodium: 137 mmol/L (ref 135–145)

## 2017-06-20 LAB — TROPONIN I
TROPONIN I: 0.07 ng/mL — AB (ref <0.03)
Troponin I: <0.03 ng/mL (ref <0.03)

## 2017-06-20 LAB — MRSA PCR SCREENING: MRSA by PCR: NEGATIVE

## 2017-06-20 LAB — GLUCOSE, CAPILLARY: GLUCOSE-CAPILLARY: 118 mg/dL — AB (ref 65–99)

## 2017-06-20 MED ORDER — CINACALCET HCL 30 MG PO TABS
120.0000 mg | ORAL_TABLET | ORAL | Status: DC
  Administered 2017-06-20: 120 mg via ORAL
  Filled 2017-06-20 (×2): qty 4

## 2017-06-20 MED ORDER — HYDRALAZINE HCL 20 MG/ML IJ SOLN: 10.0000 mg | INTRAMUSCULAR | Status: DC | PRN

## 2017-06-20 MED ORDER — ACETAMINOPHEN 325 MG PO TABS
ORAL_TABLET | ORAL | Status: AC
  Filled 2017-06-20: qty 2

## 2017-06-20 MED ORDER — DARBEPOETIN ALFA 200 MCG/0.4ML IJ SOSY
200.0000 ug | PREFILLED_SYRINGE | INTRAMUSCULAR | Status: DC
  Filled 2017-06-20: qty 0.4

## 2017-06-20 MED ORDER — DARBEPOETIN ALFA 200 MCG/0.4ML IJ SOSY: 200.0000 ug | PREFILLED_SYRINGE | INTRAMUSCULAR | Status: DC

## 2017-06-20 MED ORDER — RENA-VITE PO TABS
1.0000 | ORAL_TABLET | Freq: Every day | ORAL | Status: DC
  Administered 2017-06-20 – 2017-06-22 (×3): 1 via ORAL
  Filled 2017-06-20 (×3): qty 1

## 2017-06-20 NOTE — Progress Notes (Signed)
Family Medicine Teaching Service Daily Progress Note Intern Pager: [phone]  Patient name: Monica [name] Medical record number: [id number] Date of birth: 1932-09-03 Age: 83 y.o. Gender: female  Primary Care Provider: Leland HerYoo, Elsia J, DO Consultants: nephrology Code Status: full  Pt Overview and Major Events to Date:  7/13: Admitted to FMTS with hypertensive emergency with visual disturbance  Assessment and Plan: Monica [name] is a 83 y.o. female with PMH of hypertension, esrd, anemia of chronic disease, displaced fx R femoral neck, h/o etoh dependence, gastric ulcer, anxiety presenting with hypertensive urgency, vision disturbance, headache, hyperkalemia.  Hypertensive Emergency: BPs in the 190s on admission with ?visual changes. Visual fields normal on exam this morning. BPs remain elevated in the 170s systolic this morning. Only on Metoprolol at home. Has not had HD for the last 9 days, so volume overload is likely contributing. - Continue metoprolol 25mg  BID - PRN hydralazine for sbp>180s - Nephrology following, appreciate recommendations. Plan for HD today. - Will monitor BPs after HD and may need to add additional med  Hyperkalemia, resoved: K 5.4 on admission, improved to 4.7 this morning after Kayexalate x 1. - HD today - daily renal function panel  ESRD: Missed last several dialysis sessions over last 9 days.  - nephro following, appreciate recs, HD today  Impaired Mobility: Able to ambulate with walker at home.  - PT/OT consult  History of ETOH dependence: No alcohol use since the 1970s. - clinically monitor  FEN/GI: renal diet Prophylaxis: heparin  Disposition: home vs SNF, pending PT/OT recs  Subjective:  Patient states she is doing fine this morning. She states she is having some mild leg cramping on HD, but has no other concerns.  Objective: Temp:  [97.9 F (36.6 C)-98.3 F (36.8 C)] 98.3 F (36.8 C) (07/14 0811) Pulse Rate:  [57-72] 72 (07/14  0830) Resp:  [15-20] 18 (07/14 0258) BP: (146-186)/(73-121) 172/95 (07/14 0830) SpO2:  [98 %-100 %] 98 % (07/14 0258) Weight:  [120 lb (54.4 kg)] 120 lb (54.4 kg) (07/13 2037) Physical Exam: General: alert, answering questions, in NAD, in HD HEENT: [place]/AT, EOMI, mildly dry MM Neck: supple Cardiovascular: RRR, no murmurs, L IJ permcath in place. Respiratory: CTAB, normal work of breathing. MSK: moves all 4 extremities spontaneously, no edema, deformity involving right leg  Derm: warm, dry, no rashes Neuro: AOx4, no gross deficits Psych: appropriate  Laboratory:  Recent Labs Lab 06/15/17 1152 06/19/17 1524  WBC 5.7 6.1  HGB 10.4* 10.4*  HCT 32.1* 32.9*  PLT 318 389    Recent Labs Lab 06/15/17 1152 06/19/17 1524 06/20/17 0605  NA 136 134* 137  K 4.7 5.4* 4.7  CL 103 107 108  CO2 18* 13* 14*  BUN 55* 78* 79*  CREATININE 9.58* 10.99* 11.01*  CALCIUM 8.9 9.1 9.2  PROT  --  8.0  --   BILITOT  --  0.4  --   ALKPHOS  --  81  --   ALT  --  11*  --   AST  --  12*  --   GLUCOSE 80 88 104*    Alcie Runions, Allyn KennerKaty Dodd, MD 06/20/2017, 9:03 AM PGY-3, [place] [facility] FPTS Intern pager: [phone], text pages welcome

## 2017-06-20 NOTE — Consult Note (Signed)
Monica [name] is an 83 y.o. female referred by Dr Ardelia Mems   Chief Complaint: ESRD, mild hyperkalemia, Met acidosis, noncompliance HPI: 83yo BF with ESRD on HD MWF at [facility] with hx noncompliance.  She has not gone to HD in 9 days and has not been taking BP meds.  She says she had no transportation but rides [employer].  Then said she had fallen and had difficulty walking but has a wheelchair?? She took her BP at home at it was high so decided to go to ER.  She still wants to cont with HD but she needs to do a better job caring for herself or at least enlisting the help of her family.  Past Medical History:  Diagnosis Date  . Abscess of buttock 12/29/2013  . Acute blood loss anemia 03/13/2017  . Acute delirium   . Anemia of renal disease 03/13/2017  . Anemia, chronic renal failure, stage 5 (HCC) 10/23/2016  . Anxiety   . Arthritis    "all over" (01/29/2017)  . Atrial flutter ([place])   . Chronic back pain    "all my back" (01/29/2017)  . CKD (chronic kidney disease) stage 5, GFR less than 15 ml/min (HCC) 12/29/2013  . Constipation   . Dementia   . Depression   . Displaced fracture of right femoral neck ([place])   . Elevated troponin   . Encounter for nasogastric (NG) tube placement   . Escherichia coli urinary tract infection 12/29/2013  . Escherichia coli urinary tract infection 12/29/2013  . ESRD (end stage renal disease) on dialysis [facility])    "MWF; [address] [address]" (01/29/2017)  . ETOHism ([place])    Quit in the 1980s.  . Gastric ulcer 12/2013.   Prepyloric ulcer. On follow-up EGD in 02/2014 this was 90% healed  . Gastric ulcer with hemorrhage 12/29/2013  . GERD (gastroesophageal reflux disease)   . Headache    as a young woman  . Hemorrhoids   . History of alcohol dependence ([place]) 03/23/2017  . History of gastric ulcer 03/23/2017  . History of kidney stones   . History of stomach ulcers   . Hypertension   . Left sided chest pain 10/28/2016   See 10/28/16 she describes pleuritic-type pain on the left since a  fall approximately 3 weeks ago Apparently she slipped in her tub without cardiac or neurologic prodrome  . Melena 12/29/2013  . Pancreatic cyst 09/2010.   At uncinate process.  Serial CT imaging favors benign process  . Protein-calorie malnutrition, severe 10/25/2016  . SBO (small bowel obstruction) ([place])   . Unintentional weight loss 10/23/2016  . Upper GI bleed 12/28/2013    Past Surgical History:  Procedure Laterality Date  . ABDOMINAL HYSTERECTOMY    . ANTERIOR APPROACH HEMI HIP ARTHROPLASTY Right 03/13/2017   Procedure: RIGHT TOTAL HIP ARTHROPLASTY;  Surgeon: Rod Can, MD;  Location: [place];  Service: Orthopedics;  Laterality: Right;  . ARTERIOVENOUS GRAFT PLACEMENT Left 01/29/2017   INSERTION OF ARTERIOVENOUS (AV) GORE-TEX GRAFT  STRETCHED IN LEFT FORE ARM   . AV FISTULA PLACEMENT Left 03/20/2015   Procedure: ARTERIOVENOUS (AV) FISTULA CREATION;  Surgeon: Angelia Mould, MD;  Location: [place];  Service: Vascular;  Laterality: Left;  . AV FISTULA PLACEMENT Left 01/29/2017   Procedure: INSERTION OF ARTERIOVENOUS (AV) GORE-TEX GRAFT  STRETCHED IN LEFT FORE ARM;  Surgeon: Angelia Mould, MD;  Location: [place];  Service: Vascular;  Laterality: Left;  . [place] REMOVAL Left 01/31/2017   Procedure: REMOVAL OF ARTERIOVENOUS  GORETEX GRAFT ([place]) WITH Vein patching.;  Surgeon: Conrad Shady Dale, MD;  Location: [place];  Service: Vascular;  Laterality: Left;  . CATARACT EXTRACTION Right   . COLONOSCOPY  2010   Per Dr. Collene Mares  . ESOPHAGOGASTRODUODENOSCOPY N/A 12/29/2013   Procedure: ESOPHAGOGASTRODUODENOSCOPY (EGD);  Surgeon: Lafayette Dragon, MD;  Location: [facility] ENDOSCOPY;  Service: Endoscopy;  Laterality: N/A;  . [place]   w/LOA  . FLEXIBLE SIGMOIDOSCOPY  08/2011   Per Dr. Oletta Lamas. Normal study. Random biopsies positive for trace melanosis coli.  Marland Kitchen FOOT SURGERY Bilateral    "surgery for flat feet"  . INSERTION OF DIALYSIS CATHETER Right 11/25/2016   Procedure: INSERTION OF  Right Internal jugular DIALYSIS CATHETER;  Surgeon: Conrad , MD;  Location: [place];  Service: Vascular;  Laterality: Right;  . SHOULDER ARTHROSCOPY W/ ROTATOR CUFF REPAIR Right 08/2003   Archie Endo 01/29/2017  . TIBIA FRACTURE SURGERY Right    "broke between my knee and my ankle"  . TOTAL ABDOMINAL HYSTERECTOMY W/ BILATERAL SALPINGOOPHORECTOMY  1980  . TUBAL LIGATION      Family History  Problem Relation Age of Onset  . Alcohol abuse Mother    Social History:  reports that she quit smoking about 31 years ago. Her smoking use included Cigarettes. She has a 16.00 pack-year smoking history. She has never used smokeless tobacco. She reports that she drinks alcohol. She reports that she does not use drugs.  Allergies:  Allergies  Allergen Reactions  . Aspirin Other (See Comments)    "makes my stomach hurt"  . Codeine Other (See Comments)    "i get irritated" Patient tolerates Tramadol without difficulty.     Medications Prior to Admission  Medication Sig Dispense Refill  . acetaminophen (TYLENOL) 325 MG tablet Take 2 tablets (650 mg total) by mouth 3 (three) times daily. (Patient not taking: Reported on 06/19/2017) 180 tablet 0  . aspirin 81 MG EC tablet Take 1 tablet (81 mg total) by mouth daily. (Patient not taking: Reported on 06/19/2017) 30 tablet 0  . atorvastatin (LIPITOR) 40 MG tablet Take 1 tablet (40 mg total) by mouth daily. (Patient not taking: Reported on 06/19/2017) 30 tablet 2  . calcitRIOL (ROCALTROL) 0.25 MCG capsule Take 3 capsules (0.75 mcg total) by mouth every Monday, Wednesday, and Friday with hemodialysis. (Patient not taking: Reported on 06/19/2017) 40 capsule 0  . calcium acetate (PHOSLO) 667 MG tablet Take 2 tablets (1,334 mg total) by mouth 3 (three) times daily with meals. (Patient not taking: Reported on 06/19/2017) 180 tablet 0  . cephALEXin (KEFLEX) 250 MG capsule Take 1 capsule (250 mg total) by mouth daily. (Patient not taking: Reported on 06/19/2017) 7 capsule 0   . cholecalciferol (VITAMIN D) 1000 units tablet Take 1 tablet (1,000 Units total) by mouth daily. (Patient not taking: Reported on 06/19/2017) 30 tablet 0  . clopidogrel (PLAVIX) 75 MG tablet Take 1 tablet (75 mg total) by mouth daily. (Patient not taking: Reported on 06/19/2017) 30 tablet 0  . metoprolol tartrate (LOPRESSOR) 25 MG tablet Take 1 tablet (25 mg total) by mouth 2 (two) times daily. (Patient not taking: Reported on 06/19/2017) 60 tablet 0  . nitroGLYCERIN (NITROSTAT) 0.4 MG SL tablet Place 1 tablet (0.4 mg total) under the tongue every 5 (five) minutes as needed for chest pain. (Patient not taking: Reported on 06/19/2017) 30 tablet 0  . Nutritional Supplements (FEEDING SUPPLEMENT, NEPRO CARB STEADY,) LIQD Take 237 mLs by mouth 2 (two) times daily between  meals. (Patient not taking: Reported on 06/19/2017) 60 Can 0  . pantoprazole (PROTONIX) 40 MG tablet Take 1 tablet (40 mg total) by mouth daily. 30 tablet 0  . traMADol (ULTRAM) 50 MG tablet Take 0.5 tablets (25 mg total) by mouth every 8 (eight) hours as needed. (Patient not taking: Reported on 06/19/2017) 15 tablet 0  . venlafaxine XR (EFFEXOR-XR) 75 MG 24 hr capsule Take 1 capsule (75 mg total) by mouth at bedtime. (Patient not taking: Reported on 06/19/2017) 30 capsule 0     Lab Results: UA: ND  Recent Labs  06/19/17 1524  WBC 6.1  HGB 10.4*  HCT 32.9*  PLT 389   BMET  Recent Labs  06/19/17 1524  NA 134*  K 5.4*  CL 107  CO2 13*  GLUCOSE 88  BUN 78*  CREATININE 10.99*  CALCIUM 9.1   LFT  Recent Labs  06/19/17 1524  PROT 8.0  ALBUMIN 3.9  AST 12*  ALT 11*  ALKPHOS 81  BILITOT 0.4   No results found.  ROS: No change in vision No SOB  CP last night with high BP but none now No change in bowels No dysuria No neuropathic sxs  PHYSICAL EXAM: Blood pressure (!) 177/81, pulse 66, temperature 98 F (36.7 C), temperature source Oral, resp. rate 18, height _0  (1.549 m), weight 54.4 kg (120 lb), SpO2 98  %. HEENT: [place] EOMI NECK: Lt IJ permcath LUNGS:Clear CARDIAC:RRR wo MRG ABD:+ BS NTND NO HSM EXT:Tr edema.  Hematoma pretibial region on Lt leg NEURO:CNI Ox3 No asterixis  Assessment: 1. ESRD on HD [place] MWF 2. Non compliance with meds and HD 3. HTN 4. Anemia 5. Sec HPTH 6. Mild hyperkalemia 7. Met acidosis PLAN: 1. HD today. 2. Resume outpt meds 3. Will see how BP responds to volume control and if still remains high then will need additional meds 4. Resume ESA  Elias Bordner T 06/20/2017, 7:28 AM

## 2017-06-20 NOTE — Evaluation (Signed)
Physical Therapy Evaluation Patient Details Name: Monica [name] MRN: [id number] DOB: [birth year] Today's Date: 06/20/2017   History of Present Illness  Pt is an 83 yo female admitted through ED on 06/19/17 with a hypertensive emergency with elevated BP, visual disturbances, headache, and hyperkalemia. Pt stopped taking her meds as she felt they were "poisoning" her. PMH significant for HTN, ESRD on HD, anemia, displaced R hip fracture with hemiarthroplasty 4/18, etoh abuse, gastric ulcer, anxiety.   Clinical Impression  Pt presents with the above diagnosis and below deficits for therapy evaluation. Prior to admission, pt lived alone in a single level home. Pt has a neighbor who assists PRN with grocery shopping and takes SCAT to dialysis. Pt requires Min A to Min guard for all mobility this session and is only able to perform very short distance gait due to fatigue after dialysis. Pt is not safe to return home independently at this time. Pt will benefit from SNF for short term rehab at discharge and may need to arrange further help at home considering reason for current hospitalization. Pt continues to require acute PT follow-up in order to progress toward established goals.     Follow Up Recommendations SNF;Other (comment) (May refuse SNF. Will need HHPT, RN)    Equipment Recommendations  None recommended by PT    Recommendations for Other Services OT consult     Precautions / Restrictions Precautions Precautions: Fall Restrictions Weight Bearing Restrictions: No      Mobility  Bed Mobility Overal bed mobility: Needs Assistance Bed Mobility: Sit to Supine       Sit to supine: Min guard   General bed mobility comments: Min gaurd for safety and to adjust in bed  Transfers Overall transfer level: Needs assistance Equipment used: Rolling walker (2 wheeled) Transfers: Sit to/from Stand Sit to Stand: Min assist         General transfer comment: Min A from recliner, cues for hand  placement  Ambulation/Gait Ambulation/Gait assistance: Min assist Ambulation Distance (Feet): 20 Feet Assistive device: Rolling walker (2 wheeled) Gait Pattern/deviations: Step-through pattern;Decreased stride length;Trunk flexed Gait velocity: decreased Gait velocity interpretation: Below normal speed for age/gender General Gait Details: slow cadence and dececreased step length, narrow BOS. Cues for proximity to [employer]  Stairs            Wheelchair Mobility    Modified Rankin (Stroke Patients Only)       Balance Overall balance assessment: Needs assistance Sitting-balance support: No upper extremity supported;Feet supported Sitting balance-Leahy Scale: Good     Standing balance support: Bilateral upper extremity supported Standing balance-Leahy Scale: Poor Standing balance comment: reliant on RW for stability in standing                             Pertinent Vitals/Pain Pain Assessment: No/denies pain    Home Living Family/patient expects to be discharged to:: Private residence Living Arrangements: Alone Available Help at Discharge: Neighbor;Available PRN/intermittently Type of Home: House Home Access: Level entry     Home Layout: One level Home Equipment: Wheelchair - [employer]manual;Walker - 2 wheels;Shower seat;Cane - single point Additional Comments: reports no family assistance when she discharges    Prior Function Level of Independence: Independent with assistive device(s)         Comments: reports she gets around with a RW at home.      Hand Dominance   Dominant Hand: Right    Extremity/Trunk Assessment   Upper  Extremity Assessment Upper Extremity Assessment: Defer to OT evaluation    Lower Extremity Assessment Lower Extremity Assessment: Generalized weakness    Cervical / Trunk Assessment Cervical / Trunk Assessment: Normal  Communication   Communication: No difficulties  Cognition Arousal/Alertness: Awake/alert Behavior During  Therapy: WFL for tasks assessed/performed Overall Cognitive Status: Within Functional Limits for tasks assessed                                        General Comments      Exercises     Assessment/Plan    PT Assessment Patient needs continued PT services  PT Problem List Decreased strength;Decreased activity tolerance;Decreased balance;Decreased mobility       PT Treatment Interventions DME instruction;Gait training;Functional mobility training;Therapeutic activities;Therapeutic exercise;Balance training    PT Goals (Current goals can be found in the Care Plan section)  Acute Rehab PT Goals Patient Stated Goal: to get home tomorrow PT Goal Formulation: With patient Time For Goal Achievement: 07/04/17 Potential to Achieve Goals: Good    Frequency Min 2X/week   Barriers to discharge Decreased caregiver support reports no family and one neighbor available to assist at DC    Co-evaluation               AM-PAC PT "6 Clicks" Daily Activity  Outcome Measure Difficulty turning over in bed (including adjusting bedclothes, sheets and blankets)?: None Difficulty moving from lying on back to sitting on the side of the bed? : A Lot Difficulty sitting down on and standing up from a chair with arms (e.g., wheelchair, bedside commode, etc,.)?: Total Help needed moving to and from a bed to chair (including a wheelchair)?: A Lot Help needed walking in hospital room?: A Lot Help needed climbing 3-5 steps with a railing? : A Lot 6 Click Score: 13    End of Session Equipment Utilized During Treatment: Gait belt Activity Tolerance: Patient tolerated treatment well Patient left: in bed;with call bell/phone within reach;with bed alarm set Nurse Communication: Mobility status PT Visit Diagnosis: Muscle weakness (generalized) (M62.81);Difficulty in walking, not elsewhere classified (R26.2)    Time: 1610-9604 PT Time Calculation (min) (ACUTE ONLY): 18  min   Charges:   PT Evaluation $PT Eval Moderate Complexity: 1 Procedure     PT G Codes:        Colin Broach PT, DPT  [phone]   Roxy Manns 06/20/2017, 4:02 PM

## 2017-06-20 NOTE — Progress Notes (Addendum)
Critical Lab Troponin-0.07. MD made aware. No new orders.

## 2017-06-20 NOTE — Procedures (Signed)
Pt seen on HD.  Ap 210 Vp 160 BFR 300.  Some decreased mentation and vomited on HD.  Inititial BP read 200 but changed cuff and SBP was 87.  Given fluids and BP increased to 147 and mental status improved.  Will keep even.

## 2017-06-20 NOTE — Progress Notes (Signed)
PT Cancellation Note  Patient Details Name: Monica LimesMary S [name] MRN: [id number] DOB: 1932-05-10   Cancelled Treatment:    Reason Eval/Treat Not Completed: Patient at procedure or test/unavailable . Pt currently in HD, will check back later as time allows.   Colin BroachSabra M. Bayli Quesinberry PT, DPT  [phone]  06/20/2017, 8:59 AM

## 2017-06-21 DIAGNOSIS — N186 End stage renal disease: Secondary | ICD-10-CM

## 2017-06-21 DIAGNOSIS — Z992 Dependence on renal dialysis: Secondary | ICD-10-CM

## 2017-06-21 LAB — CBC
HCT: 32 % — ABNORMAL LOW (ref 36.0–46.0)
HEMOGLOBIN: 10.5 g/dL — AB (ref 12.0–15.0)
MCH: 26.6 pg (ref 26.0–34.0)
MCHC: 32.8 g/dL (ref 30.0–36.0)
MCV: 81.2 fL (ref 78.0–100.0)
Platelets: 286 10*3/uL (ref 150–400)
RBC: 3.94 MIL/uL (ref 3.87–5.11)
RDW: 16.1 % — ABNORMAL HIGH (ref 11.5–15.5)
WBC: 6.3 10*3/uL (ref 4.0–10.5)

## 2017-06-21 LAB — RENAL FUNCTION PANEL
ANION GAP: 11 (ref 5–15)
Albumin: 3.5 g/dL (ref 3.5–5.0)
BUN: 22 mg/dL — ABNORMAL HIGH (ref 6–20)
CALCIUM: 8 mg/dL — AB (ref 8.9–10.3)
CO2: 24 mmol/L (ref 22–32)
Chloride: 97 mmol/L — ABNORMAL LOW (ref 101–111)
Creatinine, Ser: 5.35 mg/dL — ABNORMAL HIGH (ref 0.44–1.00)
GFR, EST AFRICAN AMERICAN: 8 mL/min — AB (ref >60)
GFR, EST NON AFRICAN AMERICAN: 7 mL/min — AB (ref >60)
Glucose, Bld: 94 mg/dL (ref 65–99)
Phosphorus: 3.5 mg/dL (ref 2.5–4.6)
Potassium: 3.1 mmol/L — ABNORMAL LOW (ref 3.5–5.1)
SODIUM: 132 mmol/L — AB (ref 135–145)

## 2017-06-21 LAB — TROPONIN I
TROPONIN I: 0.06 ng/mL — AB (ref <0.03)
TROPONIN I: 0.06 ng/mL — AB (ref <0.03)
Troponin I: 0.09 ng/mL (ref <0.03)

## 2017-06-21 MED ORDER — NA FERRIC GLUC CPLX IN SUCROSE 12.5 MG/ML IV SOLN: 62.5000 mg | INTRAVENOUS | Status: DC

## 2017-06-21 MED ORDER — CALCITRIOL 0.5 MCG PO CAPS: 2.0000 ug | ORAL_CAPSULE | ORAL | Status: DC

## 2017-06-21 NOTE — Progress Notes (Signed)
[name] Progress Note   Dialysis Orders: MWF [facility] calcitriol 2, sensipar 120 with HD venofer 50/week - no ESA since 5/9 - got 225 Mircera  4.25 hr 400/800 2 K 2 Ca heparin 1800 left Ij  EDW 55 - getting to 56.5 at best hgb 9.7 last - trending down iPTH 1200  Assessment/Plan: 1. Noncompliance with HD/meds- variable stories; ambivalent but not ready to stop dialysis. 2. ESRD -MWF - next HD Monday K 3.1 7/15 - Cr down to 5.3 from 11; Cr been elevated due to missed dialysis; metabolic acidosis corrected with HD. Decrease time to 4 hr at discharge - does not need 4.25 hr - kinetics are fine. 3. Anemia - hgb 10.5 stable -hold aranesp for now - has been off since May - continue weekly venofer on Wed- if hgb drops < 10 start back on Aranesp 100  4. Secondary hyperparathyroidism - P 3.5 Ca 8.8- change to calcitriol 2 which was her outpatient dose; continue sensipar /binders 5. HTN/volume - net UF 320cc 7/14 post wt 54.1 - not sure this is accurate as it is below prior outpatient weight - need standing weight for d/c -since was cramping prior to admission need to make sure EDW is not too low. Weights here not congruent with outpatient weights especially since she has missed HD- get standing weight pre HD Monday to reassess. 6. Nutrition - alb 3.5 7. Chest pain - no pain at present- trending trop - on BB, asa, plavix 7. Disp -probably SNF - discharge  Sheffield Slider, PA-C [facility] [phone] 06/21/2017,9:44 AM  LOS: 2 days   Pt seen, examined and agree w A/P as above.  Vinson Moselle MD [facility] pager [phone]   06/21/2017, 12:33 PM    Subjective:   Doesn't like dialysis. On HD since last December. Lives alone. Tells me that her neighbor brings her  to HD. Has 3 living adult children. Two children are deceased -one from [facility].  Her youngest age 38 is an alcoholic. She is afraid to leave her children. She is afraid of dying. Tells me she  wants to be DNR. Cramped on HD yesterday and cramps a lot at the outpatient center.   Objective Vitals:   06/20/17 1233 06/20/17 1707 06/20/17 2057 06/21/17 0439  BP: (!) 110/93 (!) 147/78 (!) 155/85 (!) 149/88  Pulse: (!) 101 82 74 67  Resp: (!) 21 20 20 18   Temp: (!) 97.4 F (36.3 C) 98 F (36.7 C) 99.2 F (37.3 C) 98.9 F (37.2 C)  TempSrc: Oral Oral Oral Oral  SpO2:  98% 98% 98%  Weight:      Height:       Physical Exam General: elderly AAF NAD breathing easily on room air Heart: RRR Lungs: no rales Abdomen: soft NT Extremities: no sig LE edema. Hematoma left shin, RLE anteriror bone irregularlity Dialysis Access: left IJ [facility]   Additional Objective Labs: Basic Metabolic Panel:  Recent Labs Lab 06/19/17 1524 06/20/17 0605 06/21/17 0536  NA 134* 137 132*  K 5.4* 4.7 3.1*  CL 107 108 97*  CO2 13* 14* 24  GLUCOSE 88 104* 94  BUN 78* 79* 22*  CREATININE 10.99* 11.01* 5.35*  CALCIUM 9.1 9.2 8.0*  PHOS  --  6.2* 3.5   Liver Function Tests:  Recent Labs Lab 06/19/17 1524 06/20/17 0605 06/21/17 0536  AST 12*  --   --   ALT 11*  --   --   Jolly Mango  81  --   --   BILITOT 0.4  --   --   PROT 8.0  --   --   ALBUMIN 3.9 3.8 3.5  CBC:  Recent Labs Lab 06/15/17 1152 06/19/17 1524 06/21/17 0536  WBC 5.7 6.1 6.3  NEUTROABS 3.4 3.4  --   HGB 10.4* 10.4* 10.5*  HCT 32.1* 32.9* 32.0*  MCV 84.0 83.5 81.2  PLT 318 389 286   Blood Culture    Component Value Date/Time   SDES URINE, RANDOM 08/13/2016 1622   SPECREQUEST NONE 08/13/2016 1622   CULT 50,000 COLONIES/mL ESCHERICHIA COLI (A) 08/13/2016 1622   REPTSTATUS 08/15/2016 FINAL 08/13/2016 1622    Cardiac Enzymes:  Recent Labs Lab 06/20/17 1104 06/20/17 1838 06/20/17 2350  TROPONINI <0.03 0.07* 0.09*   CBG:  Recent Labs Lab 06/20/17 1053  GLUCAP 118*   Iron Studies: No results for input(s): IRON, TIBC, TRANSFERRIN, FERRITIN in the last 72 hours. Lab Results  Component Value Date   INR  1.10 03/20/2015   INR 0.93 12/28/2013   INR 1.05 08/21/2011   Studies/Results: No results found. Medications:  . acetaminophen  650 mg Oral TID  . aspirin EC  81 mg Oral Daily  . atorvastatin  40 mg Oral q1800  . calcitRIOL  0.75 mcg Oral Q M,W,F-HD  . calcium acetate  1,334 mg Oral TID WC  . cholecalciferol  1,000 Units Oral Daily  . cinacalcet  120 mg Oral Q M,W,F-HD  . clopidogrel  75 mg Oral Daily  . darbepoetin (ARANESP) injection - DIALYSIS  200 mcg Intravenous Q Sat-HD  . [START ON 06/29/2017] darbepoetin (ARANESP) injection - DIALYSIS  200 mcg Intravenous Q Mon-HD  . feeding supplement (NEPRO CARB STEADY)  237 mL Oral BID BM  . heparin  5,000 Units Subcutaneous Q8H  . metoprolol tartrate  25 mg Oral BID  . multivitamin  1 tablet Oral QHS  . sodium chloride flush  3 mL Intravenous Q12H  . venlafaxine XR  75 mg Oral QHS

## 2017-06-21 NOTE — Progress Notes (Signed)
Pt had a 10 beat run of V-tach. Notifed MD. No new orders at this time.

## 2017-06-21 NOTE — Evaluation (Signed)
Occupational Therapy Evaluation Patient Details Name: Monica [name] MRN: [id number] DOB: 02-12-1932 Today's Date: 06/21/2017    History of Present Illness Pt is an 83 yo female admitted through ED on 06/19/17 with a hypertensive emergency with elevated BP, visual disturbances, headache, and hyperkalemia. Pt stopped taking her meds as she felt they were "poisoning" her. PMH significant for HTN, ESRD on HD, anemia, displaced R hip fracture with hemiarthroplasty 4/18, etoh abuse, gastric ulcer, anxiety.    Clinical Impression   Pt admitted with the above diagnoses and presents with below problem list. Pt will benefit from continued acute OT to address the below listed deficits and maximize independence with basic ADLs prior to d/c. PTA pt was mod I with ADLs. Pt is currently setup to min guard with ADLs. Pt with limited assistance at d/c but at this point stating she plans to go home at d/c. Pt O2 on RA was 89-92 after 2 grooming tasks at sink, recovered to 95 once resting back in bed. Pt reporting sensation of "flutter in my heart" while OOB. If pt d/c home [facility].       Follow Up Recommendations  SNF    Equipment Recommendations  3 in 1 bedside commode    Recommendations for Other Services       Precautions / Restrictions Precautions Precautions: Fall Restrictions Weight Bearing Restrictions: No      Mobility Bed Mobility Overal bed mobility: Needs Assistance Bed Mobility: Supine to Sit;Sit to Supine     Supine to sit: Min guard Sit to supine: Min guard      Transfers Overall transfer level: Needs assistance Equipment used: Rolling walker (2 wheeled) Transfers: Sit to/from Stand Sit to Stand: Min guard         General transfer comment: to/from EOB. min guard for safety.    Balance Overall balance assessment: Needs assistance Sitting-balance support: No upper extremity supported;Feet supported Sitting balance-Leahy Scale: Good      Standing balance support: Bilateral upper extremity supported Standing balance-Leahy Scale: Poor Standing balance comment: reliant on RW for stability in standing                           ADL either performed or assessed with clinical judgement   ADL Overall ADL's : Needs assistance/impaired Eating/Feeding: Set up;Sitting   Grooming: Min guard;Oral care;Wash/dry hands;Standing   Upper Body Bathing: Set up;Sitting   Lower Body Bathing: Min guard;Sit to/from stand   Upper Body Dressing : Set up;Sitting   Lower Body Dressing: Min guard;Sit to/from stand   Toilet Transfer: Min guard;Ambulation;BSC;RW   Toileting- ArchitectClothing Manipulation and Hygiene: Min guard;Sit to/from Nurse, children'sstand     Tub/Shower Transfer Details (indicate cue type and reason): n/a sponge bath at baseline Functional mobility during ADLs: Min guard;Rolling walker General ADL Comments: Pt completed bed mobility, in-room functional mobility, and 2 grooming tasks standing at sink as detailed above. Introduced [employer] conservation education would benefit from further education.      Vision Baseline Vision/History: Wears glasses       Perception     Praxis      Pertinent Vitals/Pain Pain Assessment: No/denies pain     Hand Dominance Right   Extremity/Trunk Assessment Upper Extremity Assessment Upper Extremity Assessment: Generalized weakness   Lower Extremity Assessment Lower Extremity Assessment: Defer to PT evaluation   Cervical / Trunk Assessment Cervical / Trunk Assessment: Normal   Communication Communication Communication: No difficulties   Cognition  Arousal/Alertness: Awake/alert Behavior During Therapy: WFL for tasks assessed/performed Overall Cognitive Status: Within Functional Limits for tasks assessed                                     General Comments       Exercises     Shoulder Instructions      Home Living Family/patient expects to be discharged to::  Private residence Living Arrangements: Alone Available Help at Discharge: Neighbor;Available PRN/intermittently;Other (Comment) (pt mentioned son and daughter) Type of Home: House Home Access: Level entry     Home Layout: One level     Bathroom Shower/Tub: Walk-in shower         Home Equipment: Wheelchair - [employer] - 2 wheels;Shower seat;Cane - single point   Additional Comments: sponge bath at sink at baseline      Prior Functioning/Environment Level of Independence: Independent with assistive device(s)        Comments: reports she gets around with a RW at home.         OT Problem List: Decreased activity tolerance;Impaired balance (sitting and/or standing);Decreased strength;Decreased knowledge of use of DME or AE;Decreased knowledge of precautions;Cardiopulmonary status limiting activity      OT Treatment/Interventions: Self-care/ADL training;DME and/or AE instruction;Therapeutic activities;Balance training;Patient/family education;Energy conservation;Therapeutic exercise    OT Goals(Current goals can be found in the care plan section) Acute Rehab OT Goals Patient Stated Goal: to go home OT Goal Formulation: With patient Time For Goal Achievement: 07/05/17 Potential to Achieve Goals: Good ADL Goals Pt Will Perform Grooming: with modified independence Pt Will Perform Lower Body Bathing: with modified independence Pt Will Perform Lower Body Dressing: with modified independence Pt Will Transfer to Toilet: with modified independence Pt Will Perform Toileting - Clothing Manipulation and hygiene: with modified independence Additional ADL Goal #1: Pt will independently  incorporate one energy conservation strategy into ADL task.  OT Frequency: Min 2X/week   Barriers to D/C: Decreased caregiver support          Co-evaluation              AM-PAC PT "6 Clicks" Daily Activity     Outcome Measure Help from another person eating meals?: None Help from  another person taking care of personal grooming?: A Little Help from another person toileting, which includes using toliet, bedpan, or urinal?: A Little Help from another person bathing (including washing, rinsing, drying)?: A Little Help from another person to put on and taking off regular upper body clothing?: None Help from another person to put on and taking off regular lower body clothing?: A Little 6 Click Score: 20   End of Session Equipment Utilized During Treatment: Rolling walker  Activity Tolerance: Patient tolerated treatment well Patient left: in bed;with call bell/phone within reach;with bed alarm set;with family/visitor present  OT Visit Diagnosis: Unsteadiness on feet (R26.81);History of falling (Z91.81);Muscle weakness (generalized) (M62.81)                Time: 4098-1191 OT Time Calculation (min): 26 min Charges:  OT General Charges $OT Visit: 1 Procedure OT Evaluation $OT Eval Low Complexity: 1 Procedure OT Treatments $Self Care/Home Management : 8-22 mins G-Codes:       Pilar Grammes 06/21/2017, 9:47 AM

## 2017-06-21 NOTE — Progress Notes (Signed)
Family Medicine Teaching Service Daily Progress Note Intern Pager: [phone]  Patient name: Monica [name] Medical record number: [id number] Date of birth: [birth year] Age: 83 y.o. Gender: female  Primary Care Provider: Leland HerYoo, Elsia J, DO Consultants: nephrology Code Status: full  Pt Overview and Major Events to Date:  7/13: Admitted to [facility]MTS with hypertensive emergency with visual disturbance 7/14: CP in HD > EKG with new TWI in V4-V6.  Assessment and Plan: Monica [name] is a 83 y.o. female with PMH of hypertension, esrd, anemia of chronic disease, displaced fx R femoral neck, h/o etoh dependence, gastric ulcer, anxiety presenting with hypertensive urgency, vision disturbance, headache, hyperkalemia.  Chest Pain: Patient had an episode of chest pain in HD on 7/14. Troponins <0.03, 0.07, 0.09. Likely demand ischemia in the setting of hypotension. EKG performed with new TWI in V4-V6. Repeat EKG this morning with resolution of TWI in the lateral leads, but some very slight likely insignificant ST elevations in II, III, AVF. - Discussed with cardiologist Dr. Anne FuSkains, who review patient's EKG. He states that there is nothing to do, especially since patient is not having active chest pain. Continue medical management. - Will trend three more troponins - On beta blocker, aspirin, and plavix  Hypertensive Emergency, resolved: BPs in the 190s on admission with ?visual changes, which have since resolved. BPs improved to the 120s-150s systolic over the last 24 hours. Only on Metoprolol at home. - Continue metoprolol 25mg  BID - PRN hydralazine for sbp>180s - Nephrology following, appreciate recommendations. Received HD yesterday and was kept even due to hypotension.  ESRD: Missed last several dialysis sessions over last 9 days. Normally MWF. - nephro following, appreciate recs, HD yesterday.  Impaired Mobility: Able to ambulate with walker at home.  - PT/OT recommending SNF - SW consult for SNF  placement  History of ETOH dependence: No alcohol use since the 1970s. - clinically monitor  FEN/GI: renal diet Prophylaxis: heparin  Disposition: To SNF hopefully 7/16. Patient agreeable to SNF placement.  Subjective:  Pt states she is feeling much better this morning. She has not had any additional chest pain since HD yesterday. No other concerns.  Objective: Temp:  [97.4 F (36.3 C)-99.2 F (37.3 C)] 98.9 F (37.2 C) (07/15 0439) Pulse Rate:  [62-101] 67 (07/15 0439) Resp:  [17-24] 18 (07/15 0439) BP: (101-172)/(57-121) 149/88 (07/15 0439) SpO2:  [98 %] 98 % (07/15 0439) Weight:  [119 lb 4.3 oz (54.1 kg)] 119 lb 4.3 oz (54.1 kg) (07/14 [id number]) Physical Exam: General: sitting up in bed eating breakfast, in NAD HEENT: [place]/AT, EOMI, MMM Cardiovascular: RRR, no murmurs, L IJ permcath in place. Respiratory: CTAB, normal work of breathing. Msk: no edema   Laboratory:  Recent Labs Lab 06/15/17 1152 06/19/17 1524  WBC 5.7 6.1  HGB 10.4* 10.4*  HCT 32.1* 32.9*  PLT 318 389    Recent Labs Lab 06/15/17 1152 06/19/17 1524 06/20/17 0605  NA 136 134* 137  K 4.7 5.4* 4.7  CL 103 107 108  CO2 18* 13* 14*  BUN 55* 78* 79*  CREATININE 9.58* 10.99* 11.01*  CALCIUM 8.9 9.1 9.2  PROT  --  8.0  --   BILITOT  --  0.4  --   ALKPHOS  --  81  --   ALT  --  11*  --   AST  --  12*  --   GLUCOSE 80 88 104*    Mayo, Allyn KennerKaty Dodd, MD 06/21/2017, 6:32 AM PGY-3, [facility]  [place] Intern pager: [phone], text pages welcome

## 2017-06-22 DIAGNOSIS — E871 Hypo-osmolality and hyponatremia: Secondary | ICD-10-CM

## 2017-06-22 LAB — CBC
HCT: 31.1 % — ABNORMAL LOW (ref 36.0–46.0)
Hemoglobin: 10.2 g/dL — ABNORMAL LOW (ref 12.0–15.0)
MCH: 26.6 pg (ref 26.0–34.0)
MCHC: 32.8 g/dL (ref 30.0–36.0)
MCV: 81.2 fL (ref 78.0–100.0)
PLATELETS: 316 10*3/uL (ref 150–400)
RBC: 3.83 MIL/uL — ABNORMAL LOW (ref 3.87–5.11)
RDW: 15.7 % — AB (ref 11.5–15.5)
WBC: 5.7 10*3/uL (ref 4.0–10.5)

## 2017-06-22 LAB — RENAL FUNCTION PANEL
ALBUMIN: 3.4 g/dL — AB (ref 3.5–5.0)
Anion gap: 11 (ref 5–15)
BUN: 42 mg/dL — AB (ref 6–20)
CALCIUM: 8.5 mg/dL — AB (ref 8.9–10.3)
CO2: 23 mmol/L (ref 22–32)
Chloride: 95 mmol/L — ABNORMAL LOW (ref 101–111)
Creatinine, Ser: 7.15 mg/dL — ABNORMAL HIGH (ref 0.44–1.00)
GFR calc Af Amer: 5 mL/min — ABNORMAL LOW (ref >60)
GFR calc non Af Amer: 5 mL/min — ABNORMAL LOW (ref >60)
GLUCOSE: 93 mg/dL (ref 65–99)
PHOSPHORUS: 4.3 mg/dL (ref 2.5–4.6)
Potassium: 3.3 mmol/L — ABNORMAL LOW (ref 3.5–5.1)
SODIUM: 129 mmol/L — AB (ref 135–145)

## 2017-06-22 MED ORDER — PENTAFLUOROPROP-TETRAFLUOROETH EX AERO: 1.0000 "application " | INHALATION_SPRAY | CUTANEOUS | Status: DC | PRN

## 2017-06-22 MED ORDER — HEPARIN SODIUM (PORCINE) 1000 UNIT/ML DIALYSIS: 1000.0000 [IU] | INTRAMUSCULAR | Status: DC | PRN

## 2017-06-22 MED ORDER — LIDOCAINE HCL (PF) 1 % IJ SOLN: 5.0000 mL | INTRAMUSCULAR | Status: DC | PRN

## 2017-06-22 MED ORDER — ALTEPLASE 2 MG IJ SOLR: 2.0000 mg | Freq: Once | INTRAMUSCULAR | Status: DC | PRN

## 2017-06-22 MED ORDER — HEPARIN SODIUM (PORCINE) 1000 UNIT/ML DIALYSIS: 20.0000 [IU]/kg | INTRAMUSCULAR | Status: DC | PRN

## 2017-06-22 MED ORDER — POTASSIUM CHLORIDE CRYS ER 20 MEQ PO TBCR
40.0000 meq | EXTENDED_RELEASE_TABLET | Freq: Two times a day (BID) | ORAL | Status: AC
  Administered 2017-06-22 (×2): 40 meq via ORAL
  Filled 2017-06-22 (×2): qty 2

## 2017-06-22 MED ORDER — SODIUM CHLORIDE 0.9 % IV SOLN: 100.0000 mL | INTRAVENOUS | Status: DC | PRN

## 2017-06-22 MED ORDER — LIDOCAINE-PRILOCAINE 2.5-2.5 % EX CREA: 1.0000 "application " | TOPICAL_CREAM | CUTANEOUS | Status: DC | PRN

## 2017-06-22 NOTE — Progress Notes (Signed)
Patient seen and examined on Hemodialysis. QB 400 L TDC UF goal even.  Came in at EDW today but had a problem with cramping on Saturday.  Expect she will need an EDW adjustment.  Treatment adjusted as needed.  Bufford ButtnerElizabeth Octa Uplinger MD [facility]  pgr [phone] 2:47 PM

## 2017-06-22 NOTE — Progress Notes (Signed)
PT Cancellation Note  Patient Details Name: Monica [name] MRN: [id number] DOB: Apr 24, 1932   Cancelled Treatment:    Reason Eval/Treat Not Completed: Patient at procedure or test/unavailable.  Pt is gone to HD when PT attempted x 2 and will try again tomorrow.   Ivar DrapeRuth E Kamala Kolton 06/22/2017, 5:31 PM   Samul Dadauth Odie Rauen, PT MS Acute Rehab Dept. Number: [facility] [phone] and [facility] [phone]

## 2017-06-22 NOTE — Progress Notes (Signed)
[facility] Progress Note   Dialysis Orders:  MWF [facility] calcitriol 2, sensipar 120 with HD venofer 50/week - no ESA since 5/9 - got 225 Mircera  4.25 hr 400/800 2 K 2 Ca heparin 1800 left Ij  EDW 55 - getting to 56.5 at best hgb 9.7 last - trending down iPTH 1200  Backgroud: 83 yo female with ESRD who missed HD for 9 days. Admitted with HTN emergency, mild hyperkalemia, metabolic acidosis. Urgent HD 7/14. Lives alone and has neighbor who takes her to HD or uses [facility]. Cramps frequently at outpatient center.   Assessment/Plan: 1. Noncompliance with HD/meds- Doesn't like dialysis and has variable stories for missing HD  but not ready to stop dialysis. 2. ESRD -MWF - next HD Monday K 3.3 -  metabolic acidosis corrected with HD. Decrease time to 4 hr at discharge - does not need 4.25 hr - kinetics are fine. 3. Anemia - hgb 10.2 stable -hold aranesp for now - has been off since May - continue weekly venofer on Wed- if hgb drops < 10 start back on Aranesp 100  4. Secondary hyperparathyroidism - P 4.3 Ca 8.5- change to calcitriol 2 which was her outpatient dose; continue sensipar /binders 5. HTN/volume - net UF 320cc 7/14 post wt 54.1 - not sure this is accurate as it is below prior outpatient weight - need standing weight for d/c -since was cramping prior to admission need to make sure EDW is not too low. Weights here not congruent with outpatient weights especially since she has missed HD- get standing weight pre HD Monday to reassess. 6. Nutrition - alb 3.5 7. Chest pain - no pain at present- trending trop - on BB, asa, plavix 7. Disp -probably SNF - discharge  Tomasa BlaseOgechi Grace Savilla [facility] [facility] Pager [phone] 06/22/2017,9:37 AM   Subjective:    Seen in room eating breakfast. Spirits good. No c/os this am. Understands what happens if she misses treatments and not ready to stop.   Objective Vitals:   06/21/17 2000 06/22/17 0013 06/22/17 0402 06/22/17 0914   BP: 118/76 (!) 153/72 138/70 132/73  Pulse: 72 66 (!) 59 62  Resp: 16 16 16 18   Temp: 99 F (37.2 C) 98.6 F (37 C) 98.9 F (37.2 C) 98.8 F (37.1 C)  TempSrc: Oral Oral Oral Oral  SpO2: 100% 99% 99% 98%  Weight:      Height:       Physical Exam General: elderly AAF NAD breathing easily on room air Heart: RRR Lungs: CTAB  Abdomen: soft NT Extremities: no LE edema. Hematoma left shin, RLE anteriror bone irregularlity [facility]   Additional Objective Labs: Basic Metabolic Panel:  Recent Labs Lab 06/20/17 0605 06/21/17 0536 06/22/17 0446  NA 137 132* 129*  K 4.7 3.1* 3.3*  CL 108 97* 95*  CO2 14* 24 23  GLUCOSE 104* 94 93  BUN 79* 22* 42*  CREATININE 11.01* 5.35* 7.15*  CALCIUM 9.2 8.0* 8.5*  PHOS 6.2* 3.5 4.3   Liver Function Tests:  Recent Labs Lab 06/19/17 1524 06/20/17 0605 06/21/17 0536 06/22/17 0446  AST 12*  --   --   --   ALT 11*  --   --   --   ALKPHOS 81  --   --   --   BILITOT 0.4  --   --   --   PROT 8.0  --   --   --   ALBUMIN 3.9 3.8  3.5 3.4*  CBC:  Recent Labs Lab 06/15/17 1152 06/19/17 1524 06/21/17 0536 06/22/17 0446  WBC 5.7 6.1 6.3 5.7  NEUTROABS 3.4 3.4  --   --   HGB 10.4* 10.4* 10.5* 10.2*  HCT 32.1* 32.9* 32.0* 31.1*  MCV 84.0 83.5 81.2 81.2  PLT 318 389 286 316   Blood Culture    Component Value Date/Time   SDES URINE, RANDOM 08/13/2016 1622   SPECREQUEST NONE 08/13/2016 1622   CULT 50,000 COLONIES/mL ESCHERICHIA COLI (A) 08/13/2016 1622   REPTSTATUS 08/15/2016 FINAL 08/13/2016 1622    Cardiac Enzymes:  Recent Labs Lab 06/20/17 1104 06/20/17 1838 06/20/17 2350 06/21/17 1156 06/21/17 1813  TROPONINI <0.03 0.07* 0.09* 0.06* 0.06*   CBG:  Recent Labs Lab 06/20/17 1053  GLUCAP 118*   Iron Studies: No results for input(s): IRON, TIBC, TRANSFERRIN, FERRITIN in the last 72 hours. Lab Results  Component Value Date   INR 1.10 03/20/2015   INR 0.93 12/28/2013   INR 1.05 08/21/2011    Studies/Results: No results found. Medications: . [START ON 06/24/2017] ferric gluconate (FERRLECIT/NULECIT) IV     . acetaminophen  650 mg Oral TID  . aspirin EC  81 mg Oral Daily  . atorvastatin  40 mg Oral q1800  . calcitRIOL  2 mcg Oral Q M,W,F-HD  . calcium acetate  1,334 mg Oral TID WC  . cholecalciferol  1,000 Units Oral Daily  . cinacalcet  120 mg Oral Q M,W,F-HD  . clopidogrel  75 mg Oral Daily  . darbepoetin (ARANESP) injection - DIALYSIS  200 mcg Intravenous Q Sat-HD  . feeding supplement (NEPRO CARB STEADY)  237 mL Oral BID BM  . heparin  5,000 Units Subcutaneous Q8H  . metoprolol tartrate  25 mg Oral BID  . multivitamin  1 tablet Oral QHS  . sodium chloride flush  3 mL Intravenous Q12H  . venlafaxine XR  75 mg Oral QHS

## 2017-06-22 NOTE — Progress Notes (Signed)
Clinical Social Worker met patient at bedside to offer support and discuss dispotion needs. Patient stated that she lives at home with her little poodle. Patient stated her daughter is suppose to live with her she does not and her son come around the home to assist her with things she needs. Patient stated her son does not live with her because he has issues with substance abuse so she does not allow him to stay at her home. Patient stated she has been at a SNF twice this past year and does not want to return.  Patient stated she will look into getting an aid to come into the home. Patient stated her neighbor still helps. CSW expressed her concerns for patient staying at the home by herself but patient stated she would prefer to be home where she is able to do as she please. Patient refused SNF.  CSW signing of at this time as patient no longer has social issues. Please consult again if anything changes.    , MSW,  LCSWA [phone]   

## 2017-06-22 NOTE — Progress Notes (Signed)
Family Medicine Teaching Service Daily Progress Note Intern Pager: [phone]  Patient name: Monica [name] Medical record number: [id number] Date of birth: 11/12/1932 Age: 83 y.o. Gender: female  Primary Care Provider: Leland HerYoo, Elsia J, DO Consultants: Nephrology  Code Status: Full   Pt Overview and Major Events to Date:  7/13: Admitted to FMTS with hypertensive emergency with visual disturbance 7/14: CP in HD > EKG with new TWI in V4-V6.  Assessment and Plan: Monica MaxwellMary S Brownis a 83 y.o.femalewith PMH of hypertension, esrd, anemia of chronic disease, displaced fx R femoral neck, h/o etoh dependence, gastric ulcer, anxietypresenting with hypertensive urgency, vision disturbance, headache, hyperkalemia.  Chest Pain:  Patient had an episode of chest pain in HD on 7/14. Troponins 0.03>0.07>0.09>0.06>0.06. Likely demand ischemia in the setting of hypotension. EKG performed on 7/14 with new T wave inversions in V4-V6. Repeat EKG on 7/15 with resolution of TWI in the lateral leads, but some very slight likely insignificant ST elevations in II, III, AVF. - Discussed with cardiologist Dr. Anne FuSkains, who review patient's EKG. He states that there is nothing to do, especially since patient is not having active chest pain. -Continue medical management. - continue to trend troponins  - On beta blocker, aspirin, and plavix  Hypertensive Emergency, resolved:  BPs in the 190s on admission with questionable visual changes, which have since resolved. BPs improved to the 138/70. Only on Metoprolol at home. - Continue metoprolol 25mg  BID - PRN hydralazine for sbp>180s - Nephrology following, appreciate recommendations. Received HD and was kept even due to hypotension.  Hyponatremia Current Na of 129. Na of 134 on admission. Likely due to history of ESRD.  -continue to monitor   ESRD:  Missed last several dialysis sessions over last 9 days. Normally MWF. - nephro following, appreciate recs - MWF dialysis    Impaired Mobility:  Able to ambulate with walker at home.  - PT/OT recommending SNF - SW consult for SNF placement  History of ETOH dependence:  No alcohol use since the 1970s. - clinically monitor  FEN/GI: renal diet  PPx: heparin   Disposition: Patient agreeable to SNF placement   Subjective:  Monica MaxwellMary S Brownis a 83 y.o.femalewith PMH of hypertension, esrd, anemia of chronic disease, displaced fx R femoral neck, h/o etoh dependence, gastric ulcer, anxietypresenting with hypertensive urgency, vision disturbance, headache, hyperkalemia. Patient today states she is better. Endorses chills, but denies fever, SOB, Chest pain. States slight headache last night but improved with rest.   Objective: Temp:  [98.6 F (37 C)-99.1 F (37.3 C)] 99.1 F (37.3 C) (07/16 1415) Pulse Rate:  [59-79] 79 (07/16 1415) Resp:  [16-18] 17 (07/16 1415) BP: (118-153)/(68-76) 137/68 (07/16 1415) SpO2:  [98 %-100 %] 98 % (07/16 1415) Weight:  [121 lb 7.6 oz (55.1 kg)] 121 lb 7.6 oz (55.1 kg) (07/16 1415) Physical Exam: General: awake and alert, NAD, sitting in chair  Cardiovascular: RRR, no MRG Respiratory: CTAB, no wheezes rales or rhonchi Abdomen: soft, non tender, non distended, bowel sounds x 4 quadrants  Extremities: no edema, non tender   Laboratory:  Recent Labs Lab 06/19/17 1524 06/21/17 0536 06/22/17 0446  WBC 6.1 6.3 5.7  HGB 10.4* 10.5* 10.2*  HCT 32.9* 32.0* 31.1*  PLT 389 286 316    Recent Labs Lab 06/19/17 1524 06/20/17 0605 06/21/17 0536 06/22/17 0446  NA 134* 137 132* 129*  K 5.4* 4.7 3.1* 3.3*  CL 107 108 97* 95*  CO2 13* 14* 24 23  BUN 78* 79* 22* 42*  CREATININE 10.99* 11.01* 5.35* 7.15*  CALCIUM 9.1 9.2 8.0* 8.5*  PROT 8.0  --   --   --   BILITOT 0.4  --   --   --   ALKPHOS 81  --   --   --   ALT 11*  --   --   --   AST 12*  --   --   --   GLUCOSE 88 104* 94 93    Imaging/Diagnostic Tests:   Oralia Manis, DO 06/22/2017, 3:06 PM PGY-1, [facility]  Health Family Medicine FPTS Intern pager: [phone], text pages welcome

## 2017-06-23 LAB — RENAL FUNCTION PANEL
ANION GAP: 7 (ref 5–15)
Albumin: 3.3 g/dL — ABNORMAL LOW (ref 3.5–5.0)
BUN: 16 mg/dL (ref 6–20)
CO2: 26 mmol/L (ref 22–32)
Calcium: 8.8 mg/dL — ABNORMAL LOW (ref 8.9–10.3)
Chloride: 103 mmol/L (ref 101–111)
Creatinine, Ser: 3.82 mg/dL — ABNORMAL HIGH (ref 0.44–1.00)
GFR calc Af Amer: 12 mL/min — ABNORMAL LOW (ref >60)
GFR calc non Af Amer: 10 mL/min — ABNORMAL LOW (ref >60)
GLUCOSE: 92 mg/dL (ref 65–99)
POTASSIUM: 4.8 mmol/L (ref 3.5–5.1)
Phosphorus: 2 mg/dL — ABNORMAL LOW (ref 2.5–4.6)
Sodium: 136 mmol/L (ref 135–145)

## 2017-06-23 LAB — CBC
HEMATOCRIT: 29.1 % — AB (ref 36.0–46.0)
HEMOGLOBIN: 9.5 g/dL — AB (ref 12.0–15.0)
MCH: 26.8 pg (ref 26.0–34.0)
MCHC: 32.6 g/dL (ref 30.0–36.0)
MCV: 82 fL (ref 78.0–100.0)
Platelets: 295 10*3/uL (ref 150–400)
RBC: 3.55 MIL/uL — ABNORMAL LOW (ref 3.87–5.11)
RDW: 16.1 % — ABNORMAL HIGH (ref 11.5–15.5)
WBC: 6.9 10*3/uL (ref 4.0–10.5)

## 2017-06-23 NOTE — Progress Notes (Signed)
[facility] Progress Note   Dialysis Orders:  MWF [facility] calcitriol 2, sensipar 120 with HD venofer 50/week - no ESA since 5/9 - got 225 Mircera  4.25 hr 400/800 2 K 2 Ca heparin 1800 left Ij  EDW 55 - getting to 56.5 at best hgb 9.7 last - trending down iPTH 1200  Backgroud: 83 yo female with ESRD who missed HD for 9 days. Admitted with HTN emergency, mild hyperkalemia, metabolic acidosis. Urgent HD 7/14. Lives alone and has neighbor who takes her to HD or uses [facility]. Cramps frequently at outpatient center.   Assessment/Plan: 1. Noncompliance with HD/meds- Doesn't like dialysis and has variable stories for missing HD  but not ready to stop dialysis. 2. ESRD -MWF - next HD Wed at outpatient center -  Decrease time to 4 hr at discharge - does not need 4.25 hr - kinetics are fine. 3. Anemia - hgb 10.2 stable -hold aranesp for now - has been off since May - continue weekly venofer on Wed- if hgb drops < 10 start back on Aranesp 100  4. Secondary hyperparathyroidism - P 4.3 Ca 8.5- change to calcitriol 2 which was her outpatient dose; continue sensipar /binders 5. HTN/volume -  BP controlled. Believe outpatient EDW may be too low. Pre HD wt 55.1kg with even UF yesterday. Cramping frequently as outpatient. Appears euvolemic on exam today. Will allow 0.5kg increase.  6. Nutrition - alb 3.5 7. Chest pain - no pain at present- trending trop - on BB, asa, plavix 7. Disp -- D/C home - refused SNF   Tomasa Blasegechi Grace Ejigiri PA-C [facility] Pager [phone] 06/23/2017,9:53 AM   Subjective:    HD yesterday and tolerated w/o cramping Feels good today No c/os For discharge per notes   Objective Vitals:   06/22/17 1800 06/22/17 2131 06/23/17 0431 06/23/17 0919  BP: (!) 144/82 127/75 126/66 132/64  Pulse: 97 94 76 80  Resp:  16 17 17   Temp:  98.8 F (37.1 C) 98.3 F (36.8 C) 98.7 F (37.1 C)  TempSrc:  Oral Oral Oral  SpO2:  99% 99% 99%  Weight:  55.8 kg (123 lb)     Height:       Physical Exam General: elderly AAF NAD breathing easily on room air Heart: RRR Lungs: CTAB  Abdomen: soft NT Extremities: no LE edema. Hematoma left shin, RLE anteriror bone irregularlity Dialysis Access: L [facility]   Additional Objective Labs: Basic Metabolic Panel:  Recent Labs Lab 06/21/17 0536 06/22/17 0446 06/23/17 0506  NA 132* 129* 136  K 3.1* 3.3* 4.8  CL 97* 95* 103  CO2 24 23 26   GLUCOSE 94 93 92  BUN 22* 42* 16  CREATININE 5.35* 7.15* 3.82*  CALCIUM 8.0* 8.5* 8.8*  PHOS 3.5 4.3 2.0*   Liver Function Tests:  Recent Labs Lab 06/19/17 1524  06/21/17 0536 06/22/17 0446 06/23/17 0506  AST 12*  --   --   --   --   ALT 11*  --   --   --   --   ALKPHOS 81  --   --   --   --   BILITOT 0.4  --   --   --   --   PROT 8.0  --   --   --   --   ALBUMIN 3.9  < > 3.5 3.4* 3.3*  < > = values in this interval not displayed.CBC:  Recent Labs Lab 06/19/17 1524 06/21/17 0536 06/22/17  0446 06/23/17 0506  WBC 6.1 6.3 5.7 6.9  NEUTROABS 3.4  --   --   --   HGB 10.4* 10.5* 10.2* 9.5*  HCT 32.9* 32.0* 31.1* 29.1*  MCV 83.5 81.2 81.2 82.0  PLT 389 286 316 295   Blood Culture    Component Value Date/Time   SDES URINE, RANDOM 08/13/2016 1622   SPECREQUEST NONE 08/13/2016 1622   CULT 50,000 COLONIES/mL ESCHERICHIA COLI (A) 08/13/2016 1622   REPTSTATUS 08/15/2016 FINAL 08/13/2016 1622    Cardiac Enzymes:  Recent Labs Lab 06/20/17 1104 06/20/17 1838 06/20/17 2350 06/21/17 1156 06/21/17 1813  TROPONINI <0.03 0.07* 0.09* 0.06* 0.06*   CBG:  Recent Labs Lab 06/20/17 1053  GLUCAP 118*   Iron Studies: No results for input(s): IRON, TIBC, TRANSFERRIN, FERRITIN in the last 72 hours. Lab Results  Component Value Date   INR 1.10 03/20/2015   INR 0.93 12/28/2013   INR 1.05 08/21/2011   Studies/Results: No results found. Medications: . sodium chloride    . sodium chloride    . [START ON 06/24/2017] ferric gluconate (FERRLECIT/NULECIT) IV      . acetaminophen  650 mg Oral TID  . aspirin EC  81 mg Oral Daily  . atorvastatin  40 mg Oral q1800  . calcitRIOL  2 mcg Oral Q M,W,F-HD  . calcium acetate  1,334 mg Oral TID WC  . cholecalciferol  1,000 Units Oral Daily  . cinacalcet  120 mg Oral Q M,W,F-HD  . clopidogrel  75 mg Oral Daily  . feeding supplement (NEPRO CARB STEADY)  237 mL Oral BID BM  . heparin  5,000 Units Subcutaneous Q8H  . metoprolol tartrate  25 mg Oral BID  . multivitamin  1 tablet Oral QHS  . sodium chloride flush  3 mL Intravenous Q12H  . venlafaxine XR  75 mg Oral QHS

## 2017-06-23 NOTE — Discharge Summary (Signed)
Family Medicine Teaching [facility] Discharge Summary  Patient name: Monica [name] Medical record number: [id number] Date of birth: 1932/05/29 Age: 83 y.o. Gender: female Date of Admission: 06/19/2017  Date of Discharge: 06/23/2017 Admitting Physician: Latrelle DodrillBrittany J McIntyre, MD  Primary Care Provider: Leland HerYoo, Elsia J, DO Consultants: Nephrology, PT  Indication for Hospitalization: hypertensive emergency and visual disturbance   Discharge Diagnoses/Problem List:  Hypertensive emergency  Chest Pain Hyponatremia  ESRD Impaired mobility  Hx of ETOH dependence   Disposition: to home   Discharge Condition: stable, improving   Discharge Exam:  General: awake and alert, NAD, lying in bed eating breakfast  Cardiovascular: RRR, no MRG, 2+ pulses  Respiratory: CTAB, no wheezes, rales, or rhonchi  Abdomen: soft, non tender, slightly distended, bowel sounds x 4 Extremities: no edema, full range of motion   Brief Hospital Course:  Ms. Manson PasseyBrown is an 83 y.o. Female who presented in hypertensive emergency and possible vision changes. Patient had systolics of 190s in ED. Patient was reported to have missed several hemodialysis treatments over last 9 days prior to admission. After hemodialysis during admission, blood pressure improved and was 126/66 on discharge. Patient was hyperkalemic on admission with K of 5.4. Potassium improved following hemodialysis and K was 4.8 on discharge. Patient was also hyponatremic on admission with Na of 134 which improved after hemodialysis. Patient had Na of 136 on discharge.   Issues for Follow Up:  1. Patient will need outpatient follow up with PCP for hospital follow up 2. Patient will need follow up with home health for PT 3. Patient will need continued MWF dialysis.   Significant Procedures: Hemodialysis   Significant Labs and Imaging:   Recent Labs Lab 06/21/17 0536 06/22/17 0446 06/23/17 0506  WBC 6.3 5.7 6.9  HGB 10.5* 10.2* 9.5*  HCT 32.0* 31.1*  29.1*  PLT 286 316 295    Recent Labs Lab 06/19/17 1524 06/20/17 0605 06/21/17 0536 06/22/17 0446 06/23/17 0506  NA 134* 137 132* 129* 136  K 5.4* 4.7 3.1* 3.3* 4.8  CL 107 108 97* 95* 103  CO2 13* 14* 24 23 26   GLUCOSE 88 104* 94 93 92  BUN 78* 79* 22* 42* 16  CREATININE 10.99* 11.01* 5.35* 7.15* 3.82*  CALCIUM 9.1 9.2 8.0* 8.5* 8.8*  PHOS  --  6.2* 3.5 4.3 2.0*  ALKPHOS 81  --   --   --   --   AST 12*  --   --   --   --   ALT 11*  --   --   --   --   ALBUMIN 3.9 3.8 3.5 3.4* 3.3*    Results/Tests Pending at Time of Discharge: none   Discharge Medications:  Allergies as of 06/23/2017      Reactions   Aspirin Other (See Comments)   "makes my stomach hurt"   Codeine Other (See Comments)   "i get irritated" Patient tolerates Tramadol without difficulty.       Medication List    STOP taking these medications   cephALEXin 250 MG capsule Commonly known as:  KEFLEX     TAKE these medications   acetaminophen 325 MG tablet Commonly known as:  TYLENOL Take 2 tablets (650 mg total) by mouth 3 (three) times daily.   aspirin 81 MG EC tablet Take 1 tablet (81 mg total) by mouth daily.   atorvastatin 40 MG tablet Commonly known as:  LIPITOR Take 1 tablet (40 mg total) by mouth daily.   calcitRIOL  0.25 MCG capsule Commonly known as:  ROCALTROL Take 3 capsules (0.75 mcg total) by mouth every Monday, Wednesday, and Friday with hemodialysis.   calcium acetate 667 MG tablet Commonly known as:  PHOSLO Take 2 tablets (1,334 mg total) by mouth 3 (three) times daily with meals.   cholecalciferol 1000 units tablet Commonly known as:  VITAMIN D Take 1 tablet (1,000 Units total) by mouth daily.   clopidogrel 75 MG tablet Commonly known as:  PLAVIX Take 1 tablet (75 mg total) by mouth daily.   feeding supplement (NEPRO CARB STEADY) Liqd Take 237 mLs by mouth 2 (two) times daily between meals.   metoprolol tartrate 25 MG tablet Commonly known as:  LOPRESSOR Take 1  tablet (25 mg total) by mouth 2 (two) times daily.   nitroGLYCERIN 0.4 MG SL tablet Commonly known as:  NITROSTAT Place 1 tablet (0.4 mg total) under the tongue every 5 (five) minutes as needed for chest pain.   pantoprazole 40 MG tablet Commonly known as:  PROTONIX Take 1 tablet (40 mg total) by mouth daily.   traMADol 50 MG tablet Commonly known as:  ULTRAM Take 0.5 tablets (25 mg total) by mouth every 8 (eight) hours as needed.   venlafaxine XR 75 MG 24 hr capsule Commonly known as:  EFFEXOR-XR Take 1 capsule (75 mg total) by mouth at bedtime.       Discharge Instructions: Please refer to Patient Instructions section of EMR for full details.  Patient was counseled important signs and symptoms that should prompt return to medical care, changes in medications, dietary instructions, activity restrictions, and follow up appointments.   Follow-Up Appointments: Follow-up Information    Ellwood Dense, DO. Go on 06/26/2017.   Specialty:  Family Medicine Why:  Please go to hospital follow up with Dr. Linwood Dibbles on 06/26/17 @3 :45 pm, please arrive 15 min early  Contact information: [address] [place] [address] [phone]           Oralia Manis, DO 06/23/2017, 3:32 PM PGY-1, [facility]

## 2017-06-23 NOTE — Care Management Note (Signed)
Case Management Note  Patient Details  Name: Monica [name] MRN: [id number] Date of Birth: May 23, 1932  Subjective/Objective:     CM following for progression and d/c planning.. Met with pt to discuss her issues re missing multiple HD treatments. Per pt she has [facility] for transportation , however she was having leg pain and was unable to walk to the bus. The pt does have two wheelchairs, walker and cane. She plans to contact [facility] and go to HD at her outpatient center tomorrow.                Action/Plan: 06/23/2017 Notified by CSW that they have been contacted by MD for [facility] needs. This CM text MD asking for orders to be placed so that this can be arranged prior to d/c. Await MD order. Will offer pt choice of [facility] providers.   Expected Discharge Date:  06/23/17               Expected Discharge Plan:  [place]  In-House Referral:  NA  Discharge planning Services  CM Consult  Post Acute Care Choice:    Choice offered to:     DME Arranged:    DME Agency:     HH Arranged:    HH Agency:     Status of Service:  In process, will continue to follow  If discussed at Long Length of Stay Meetings, dates discussed:    Additional Comments:  Adron Bene, RN 06/23/2017, 1:43 PM

## 2017-06-23 NOTE — Progress Notes (Signed)
Family Medicine Teaching Service Daily Progress Note Intern Pager: [phone]  Patient name: Monica [name] Medical record number: [id number] Date of birth: 05-Nov-1932 Age: 83 y.o. Gender: female  Primary Care Provider: Leland Her, DO Consultants: Nephrology, PT Code Status: Full   Pt Overview and Major Events to Date:  Monica Worth Brownis a 83 y.o.femalepresenting with hypertensive emergency and visual disturbance. Patient was admitted to [facility] on 06/19/2017.   Assessment and Plan: Monica Dobratz Brownis a 83 y.o.femalepresenting with hypertensive emergency and visual disturbance. PMH significant for of hypertension, esrd, anemia of chronic disease, displaced fx R femoral neck, h/o etoh dependence, gastric ulcer, anxietypresenting with hypertensive urgency, vision disturbance, headache, hyperkalemia.  Chest Pain:  Patient denies current CP. Patient had an episode of chest pain in HD on 7/14. Troponins 0.03>0.07>0.09>0.06>0.06. Likely demand ischemia in the setting of hypotension.EKG performed on 7/14 with new T wave inversions in V4-V6. Repeat EKG on 7/15 with resolution of TWI in the lateral leads, but some very slight likely insignificant ST elevations in II, III, AVF. - Discussed with cardiologist Dr. Anne Fu, who review patient's EKG. He states that there is nothing to do, especially since patient is not having active chest pain. Recommended to continue medical management. - On beta blocker, aspirin, and plavix  Hypertensive Emergency, resolved:  BPs in the 190s on admission with questionable visual changes, which have since resolved. BPs improved to the 126/66. Only on Metoprolol at home. - Continue metoprolol 25mg  BID - PRN hydralazine for sbp>180s - Nephrology following, appreciate recommendations. Received HD and was kept even due to hypotension.  Hyponatremia-resolved Current Na of 136. Resolved after HD. Na of 134 on admission. Likely due to history of ESRD.  -continue to monitor    ESRD:  Missed last several dialysis sessions over last 9 days. Normally MWF. - nephro following, appreciate recs - MWF dialysis   Impaired Mobility:  Able to ambulate with walker at home.  - PT/OT recommending SNF - patient refusing SNF  History of ETOH dependence:  No alcohol use since the 1970s. - clinically monitor  FEN/GI: renal diet  PPx: heparin   Disposition: discharge to home   Subjective:  Monica Brunelle Brownis a 83 y.o.femalepresenting with hypertensive emergency and visual disturbance. PMH significant for hypertension, esrd, anemia of chronic disease, displaced fx R femoral neck, h/o etoh dependence, gastric ulcer, anxietypresenting with hypertensive urgency, vision disturbance, headache, hyperkalemia. Patient today states she feels much better. States she has no chest pain, SOB, visual changes, nausea, vomiting, or diarrhea. Patient states slight left temporal headache that has gone away with prn tylenol. Patient states some stress over worrying about son's alcohol abuse.   Objective: Temp:  [98.3 F (36.8 C)-99.1 F (37.3 C)] 98.7 F (37.1 C) (07/17 0919) Pulse Rate:  [67-104] 80 (07/17 0919) Resp:  [16-17] 17 (07/17 0919) BP: (112-182)/(58-82) 132/64 (07/17 0919) SpO2:  [98 %-99 %] 99 % (07/17 0919) Weight:  [121 lb 7.6 oz (55.1 kg)-123 lb (55.8 kg)] 123 lb (55.8 kg) (07/16 2131) Physical Exam: General: awake and alert, NAD, lying in bed eating breakfast  Cardiovascular: RRR, no MRG, 2+ pulses  Respiratory: CTAB, no wheezes, rales, or rhonchi  Abdomen: soft, non tender, slightly distended, bowel sounds x 4 Extremities: no edema, full range of motion   Laboratory:  Recent Labs Lab 06/21/17 0536 06/22/17 0446 06/23/17 0506  WBC 6.3 5.7 6.9  HGB 10.5* 10.2* 9.5*  HCT 32.0* 31.1* 29.1*  PLT 286 316 295    Recent Labs  Lab 06/19/17 1524  06/21/17 0536 06/22/17 0446 06/23/17 0506  NA 134*  < > 132* 129* 136  K 5.4*  < > 3.1* 3.3* 4.8  CL 107  < >  97* 95* 103  CO2 13*  < > 24 23 26   BUN 78*  < > 22* 42* 16  CREATININE 10.99*  < > 5.35* 7.15* 3.82*  CALCIUM 9.1  < > 8.0* 8.5* 8.8*  PROT 8.0  --   --   --   --   BILITOT 0.4  --   --   --   --   ALKPHOS 81  --   --   --   --   ALT 11*  --   --   --   --   AST 12*  --   --   --   --   GLUCOSE 88  < > 94 93 92  < > = values in this interval not displayed.   Imaging/Diagnostic Tests:   Oralia Manisbraham, Persephonie Hegwood, DO 06/23/2017, 9:24 AM PGY-1,  Family Medicine FPTS Intern pager: [phone], text pages welcome

## 2017-06-23 NOTE — Progress Notes (Signed)
IV discontinued,catheter intact. Discharge instructions given on medications,and follow up visits, patient verbalized understanding. Home health to follow up with patient . Accompanied by staff to an awaiting vehicle.

## 2017-06-23 NOTE — Care Management Important Message (Signed)
Important Message  Patient Details  Name: Monica [name] MRN: [id number] Date of Birth: [birth year]   Medicare Important Message Given:  Yes    Cloee Dunwoody Stefan ChurchBratton 06/23/2017, 11:09 AM

## 2017-06-23 NOTE — Care Management Note (Addendum)
Case Management Note  Patient Details  Name: MALKY RUDZINSKI MRN: [id number] Date of Birth: 04/24/1932  Subjective/Objective:    CM following for progression and d/c planning. Pt has [place] aide two days per week in place  Pt states that she privately pays for this care.  Has DME and uses [facility] for transport to HD.                 Action/Plan: 06/23/17 Met with pt re HHPT, selected [facility], [facility] notified of pt plan to d/c this pm, they will provide HHPT s/p d/c.   Expected Discharge Date:  06/23/17               Expected Discharge Plan:  [place]  In-House Referral:  NA  Discharge planning Services  CM Consult  Post Acute Care Choice:  NA Choice offered to:  Patient  DME Arranged:  N/A DME Agency:  NA  HH Arranged:  PT [place] Agency:  [place]  Status of Service:  Completed, signed off  If discussed at [place] of Stay Meetings, dates discussed:    Additional Comments:  Adron Bene, RN 06/23/2017, 3:44 PM

## 2017-06-23 NOTE — Discharge Instructions (Signed)
Ms. Monica [name] was admitted for hypertensive emergency. She received hemodialysis while admitted.   Please follow up with Dr. Linwood Dibblesumball on 06/26/2017 @3 :45 pm. Please arrive 15 min early.  Please continue to have MWF dialysis.

## 2017-06-23 NOTE — Progress Notes (Signed)
Physical Therapy Treatment Patient Details Name: Monica [name] MRN: [id number] DOB: [birth year] Today's Date: 06/23/2017    History of Present Illness Pt is an 83 yo female admitted through ED on 06/19/17 with a hypertensive emergency with elevated BP, visual disturbances, headache, and hyperkalemia. Pt stopped taking her meds as she felt they were "poisoning" her. PMH significant for HTN, ESRD on HD, anemia, displaced R hip fracture with hemiarthroplasty 4/18, etoh abuse, gastric ulcer, anxiety.     PT Comments    Pt is up to walk with PT and noted her need for safety instructions and planning a path.  Her awareness of obstacles is consistent and mainly on L side.  Will follow acutely for strengthening and mobility with safety cues and instruction for her to think about the pathway she is using to walk and get to chair.   Follow Up Recommendations  SNF     Equipment Recommendations  None recommended by PT    Recommendations for Other Services OT consult     Precautions / Restrictions Precautions Precautions: Fall Restrictions Weight Bearing Restrictions: No    Mobility  Bed Mobility Overal bed mobility: Needs Assistance Bed Mobility: Supine to Sit     Supine to sit: Min guard     General bed mobility comments: used bed rail for leverage to sit up bedside  Transfers Overall transfer level: Needs assistance Equipment used: Rolling walker (2 wheeled) Transfers: Sit to/from Stand Sit to Stand: Min guard         General transfer comment: reminders for hand placement, safety  Ambulation/Gait Ambulation/Gait assistance: Min guard Ambulation Distance (Feet): 80 Feet Assistive device: Rolling walker (2 wheeled) Gait Pattern/deviations: Step-through pattern;Narrow base of support;Trunk flexed;Shuffle;Decreased stride length Gait velocity: decreased Gait velocity interpretation: Below normal speed for age/gender General Gait Details: took her time and walked with some  reminders about avoiding the obstacles on the hall and in room   Stairs            Wheelchair Mobility    Modified Rankin (Stroke Patients Only)       Balance Overall balance assessment: Needs assistance Sitting-balance support: Feet supported Sitting balance-Leahy Scale: Good     Standing balance support: Bilateral upper extremity supported Standing balance-Leahy Scale: Fair Standing balance comment: less than fair once dynamically moving                            Cognition Arousal/Alertness: Awake/alert Behavior During Therapy: WFL for tasks assessed/performed Overall Cognitive Status: Within Functional Limits for tasks assessed                                        Exercises General Exercises - Lower Extremity Ankle Circles/Pumps: AROM;AAROM;Both;10 reps    General Comments        Pertinent Vitals/Pain Pain Assessment: No/denies pain    Home Living                      Prior Function            PT Goals (current goals can now be found in the care plan section) Acute Rehab PT Goals Patient Stated Goal: get home PT Goal Formulation: With patient Progress towards PT goals: Progressing toward goals    Frequency    Min 2X/week      PT Plan Current  plan remains appropriate    Co-evaluation              AM-PAC PT "6 Clicks" Daily Activity  Outcome Measure  Difficulty turning over in bed (including adjusting bedclothes, sheets and blankets)?: None Difficulty moving from lying on back to sitting on the side of the bed? : A Little Difficulty sitting down on and standing up from a chair with arms (e.g., wheelchair, bedside commode, etc,.)?: Total Help needed moving to and from a bed to chair (including a wheelchair)?: A Little Help needed walking in hospital room?: A Little Help needed climbing 3-5 steps with a railing? : A Lot 6 Click Score: 16    End of Session Equipment Utilized During Treatment:  Gait belt Activity Tolerance: Patient tolerated treatment well Patient left: in chair;with call bell/phone within reach;with chair alarm set Nurse Communication: Mobility status PT Visit Diagnosis: Muscle weakness (generalized) (M62.81);Difficulty in walking, not elsewhere classified (R26.2)     Time: 0922-0952 PT Time Calculation (min) (ACUTE ONLY): 30 min  Charges:  $Gait Training: 8-22 mins $Therapeutic Activity: 8-22 mins                    G Codes:  Functional Assessment Tool Used: AM-PAC 6 Clicks Basic Mobility    Ivar DrapeRuth E Kataya Guimont 06/23/2017, 11:42 AM   Samul Dadauth Kanetra Ho, PT MS Acute Rehab Dept. Number: [facility] [phone] and [facility] [phone]

## 2017-06-24 LAB — HEPATITIS B SURFACE ANTIGEN: Hepatitis B Surface Ag: NEGATIVE

## 2017-06-24 NOTE — Telephone Encounter (Signed)
Spoke with Melissa and gave verbal orders.

## 2017-06-24 NOTE — Telephone Encounter (Signed)
Will forward to MD. Athina Fahey,CMA  

## 2017-06-24 NOTE — Telephone Encounter (Signed)
Left hospital without orders for home nurse visits. Pt needs help with managing medication. Melissa with [facility] would like 2 times a week for 5 weeks. Please call Melissa at [phone]. Sunday SpillersSharon T Saunders, CMA

## 2017-06-25 ENCOUNTER — Other Ambulatory Visit: Payer: Self-pay | Admitting: Family Medicine

## 2017-06-25 DIAGNOSIS — F411 Generalized anxiety disorder: Secondary | ICD-10-CM

## 2017-06-25 DIAGNOSIS — F1320 Sedative, hypnotic or anxiolytic dependence, uncomplicated: Secondary | ICD-10-CM

## 2017-06-25 NOTE — Telephone Encounter (Signed)
Monica [name] needs to know if pt is suppose to be on xanax, Plavix, tramadol, Protonix, and Asprin. If so pt will need refills on these, pt uses [facility]. ep

## 2017-06-26 ENCOUNTER — Ambulatory Visit: Payer: Medicare Other | Admitting: Family Medicine

## 2017-06-26 MED ORDER — PANTOPRAZOLE SODIUM 40 MG PO TBEC: 40.0000 mg | DELAYED_RELEASE_TABLET | Freq: Every day | ORAL | 0 refills | Status: DC

## 2017-06-26 MED ORDER — ASPIRIN 81 MG PO TBEC: 81.0000 mg | DELAYED_RELEASE_TABLET | Freq: Every day | ORAL | 0 refills | Status: DC

## 2017-06-26 NOTE — Progress Notes (Deleted)
   Subjective:   Monica [name] is a 83 y.o. female with a history of HTN, ESRD on HD here for hospital f/u of hypertensive emergency and hyperkalemia/hyponatremia after missing several hemodialysis treatments over 9 days prior to presenting to the hospital.  Dialysis MWF.   Home health PT? outpt dialysis? meds taking? Transportation an issue? - [facility] helping to [facility] up [facility] Someone live at home? - Mel AlmondJada privately hired help, Rosezella FloridaCindy Barr neighbor who helps. Sister at bedside Refills picked up?  Review of Systems:  Per HPI.   Social History: Former smoker  Objective:  There were no vitals taken for this visit.  Gen:  83 y.o. female in NAD *** HEENT: NCAT, MMM, EOMI, PERRL, anicteric sclerae CV: RRR, no MRG, no JVD Resp: Non-labored, CTAB, no wheezes noted Abd: Soft, NTND, BS present, no guarding or organomegaly Ext: WWP, no edema MSK: Full ROM, strength intact Neuro: Alert and oriented, speech normal       Chemistry      Component Value Date/Time   NA 136 06/23/2017 0506   K 4.8 06/23/2017 0506   CL 103 06/23/2017 0506   CO2 26 06/23/2017 0506   BUN 16 06/23/2017 0506   CREATININE 3.82 (H) 06/23/2017 0506      Component Value Date/Time   CALCIUM 8.8 (L) 06/23/2017 0506   ALKPHOS 81 06/19/2017 1524   AST 12 (L) 06/19/2017 1524   ALT 11 (L) 06/19/2017 1524   BILITOT 0.4 06/19/2017 1524      Lab Results  Component Value Date   WBC 6.9 06/23/2017   HGB 9.5 (L) 06/23/2017   HCT 29.1 (L) 06/23/2017   MCV 82.0 06/23/2017   PLT 295 06/23/2017   Lab Results  Component Value Date   TSH 4.200 03/10/2017   Lab Results  Component Value Date   HGBA1C 5.1 03/10/2017   Assessment & Plan:     Monica LimesMary S Keizer is a 83 y.o. female here for ***  No problem-specific Assessment & Plan notes found for this encounter.     Ellwood Denseumball, Maira Christon, DO  PGY-1,  [facility] 06/26/2017  7:34 AM

## 2017-06-26 NOTE — Telephone Encounter (Signed)
Rx ASA and protonix. Patient is not supposed to be on xanax, was taken off while at [facility] and should not be restarted given likely contributed to mechanical fall in April. Also will not refill tramadol given an uncertain why she is on this and patient needs to establish in clinic. She is scheduled for hospital follow up with Dr. Linwood Dibblesumball today.

## 2017-06-30 ENCOUNTER — Telehealth: Payer: Self-pay | Admitting: Family Medicine

## 2017-06-30 NOTE — Telephone Encounter (Signed)
Unsuccessful contact. Left message requesting pt and/or caregiver to reschedule appt. - Mesha Guinyard

## 2017-07-08 ENCOUNTER — Telehealth: Payer: Self-pay | Admitting: Family Medicine

## 2017-07-08 NOTE — Telephone Encounter (Signed)
Monica [name] would like verbal orders for PT twice a week for four weeks. ep

## 2017-07-09 NOTE — Telephone Encounter (Signed)
Called Thayer OhmChris to give verbal order. No answer, but left voicemail giving order for PT twice a week for four weeks.   Tarri AbernethyAbigail J Demetrios Byron, MD, MPH PGY-3 [facility] Pager [phone]

## 2017-07-14 ENCOUNTER — Encounter: Payer: Self-pay | Admitting: Vascular Surgery

## 2017-07-16 NOTE — Progress Notes (Deleted)
Established Dialysis Access   History of Present Illness   Monica [name] is a 83 y.o. (27-Jan-1932) female who presents for re-evaluation of left arm for residual weakness.  This patient had undergone excision of a L FA AVG (01/31/17) for severe steal sx.  This patient was sent to OT for rehab.  At this point, the patient neurologic function in her left hand is *** at baseline.  The patient's PMH, PSH, SH, and FamHx are unchanged from 01/31/17.  Current Outpatient Prescriptions  Medication Sig Dispense Refill  . acetaminophen (TYLENOL) 325 MG tablet Take 2 tablets (650 mg total) by mouth 3 (three) times daily. (Patient not taking: Reported on 06/19/2017) 180 tablet 0  . aspirin 81 MG EC tablet Take 1 tablet (81 mg total) by mouth daily. 30 tablet 0  . atorvastatin (LIPITOR) 40 MG tablet Take 1 tablet (40 mg total) by mouth daily. (Patient not taking: Reported on 06/19/2017) 30 tablet 2  . calcitRIOL (ROCALTROL) 0.25 MCG capsule Take 3 capsules (0.75 mcg total) by mouth every Monday, Wednesday, and Friday with hemodialysis. (Patient not taking: Reported on 06/19/2017) 40 capsule 0  . calcium acetate (PHOSLO) 667 MG tablet Take 2 tablets (1,334 mg total) by mouth 3 (three) times daily with meals. (Patient not taking: Reported on 06/19/2017) 180 tablet 0  . cholecalciferol (VITAMIN D) 1000 units tablet Take 1 tablet (1,000 Units total) by mouth daily. (Patient not taking: Reported on 06/19/2017) 30 tablet 0  . clopidogrel (PLAVIX) 75 MG tablet Take 1 tablet (75 mg total) by mouth daily. (Patient not taking: Reported on 06/19/2017) 30 tablet 0  . metoprolol tartrate (LOPRESSOR) 25 MG tablet Take 1 tablet (25 mg total) by mouth 2 (two) times daily. (Patient not taking: Reported on 06/19/2017) 60 tablet 0  . nitroGLYCERIN (NITROSTAT) 0.4 MG SL tablet Place 1 tablet (0.4 mg total) under the tongue every 5 (five) minutes as needed for chest pain. (Patient not taking: Reported on 06/19/2017) 30 tablet 0  .  Nutritional Supplements (FEEDING SUPPLEMENT, NEPRO CARB STEADY,) LIQD Take 237 mLs by mouth 2 (two) times daily between meals. (Patient not taking: Reported on 06/19/2017) 60 Can 0  . pantoprazole (PROTONIX) 40 MG tablet Take 1 tablet (40 mg total) by mouth daily. 30 tablet 0  . traMADol (ULTRAM) 50 MG tablet Take 0.5 tablets (25 mg total) by mouth every 8 (eight) hours as needed. (Patient not taking: Reported on 06/19/2017) 15 tablet 0  . venlafaxine XR (EFFEXOR-XR) 75 MG 24 hr capsule Take 1 capsule (75 mg total) by mouth at bedtime. (Patient not taking: Reported on 06/19/2017) 30 capsule 0   No current facility-administered medications for this visit.     On ROS today: ***, ***   Physical Examination  ***There were no vitals filed for this visit. ***There is no height or weight on file to calculate BMI.  General {LOC:19197::"Somulent","Alert"}, {Orientation:19197::"Confused","O x 3"}, {Weight:19197::"Obese","Cachectic","WD"}, {General state of health:19197::"Ill appearing","Elderly","NAD"}  Pulmonary {Chest wall:19197::"Asx chest movement","Sym exp"}, {Air movt:19197::"Decreased *** air movt","good B air movt"}, {BS:19197::"rales on ***","rhonchi on ***","wheezing on ***","CTA B"}  Cardiac {Rhythm:19197::"Irregularly, irregular rate and rhythm","RRR, Nl S1, S2"}, {Murmur:19197::"Murmur present: ***","no Murmurs"}, {Rubs:19197::"Rub present: ***","No rubs"}, {Gallop:19197::"Gallop present: ***","No S3,S4"}  Vascular Vessel Right Left  Radial {Palpable:19197::"Not palpable","Faintly palpable","Palpable"} {Palpable:19197::"Not palpable","Faintly palpable","Palpable"}  Brachial {Palpable:19197::"Not palpable","Faintly palpable","Palpable"} {Palpable:19197::"Not palpable","Faintly palpable","Palpable"}  Ulnar {Palpable:19197::"Not palpable","Faintly palpable","Palpable"} {Palpable:19197::"Not palpable","Faintly palpable","Palpable"}    Musculo- skeletal M/S 5/5 throughout {MS:19197::"except ***","  "}, Extremities without ischemic changes {MS:19197::"except ***"," "}  Neurologic Pain and light touch intact in extremities{CN:19197::" except for decreased sensation in ***"," "}, Motor exam as listed above     Non-invasive Vascular Imaging   {side of body:30421359} Arm Access Duplex  (***):   Diameters:  *** mm  Depth:  *** mm  PSV:  *** c/s  BUE Doppler (***):   R arm:   Brachial: {Signals:19197::"none","mono","bi","tri"}, *** mm  Radial: {Signals:19197::"none","mono","bi","tri"}, *** mm  Ulnar: {Signals:19197::"none","mono","bi","tri"}, *** mm  L arm:   Brachial: {Signals:19197::"none","mono","bi","tri"}, *** mm  Radial: {Signals:19197::"none","mono","bi","tri"}, *** mm  Ulnar: {Signals:19197::"none","mono","bi","tri"}, *** mm  BUE Vein Mapping  (***):   R arm: acceptable vein conduits include ***  L arm: acceptable vein conduits include ***   Medical Decision Making   Monica [name] is a 83 y.o. female who presents with ESRD requiring hemodialysis, s/p excision of L FA AVG for severe steal   ***   Leonides SakeBrian Daton Szilagyi, MD, FACS [facility] Office: [phone] Pager: [phone]

## 2017-07-22 ENCOUNTER — Ambulatory Visit: Payer: Medicare Other | Admitting: Vascular Surgery

## 2017-07-23 ENCOUNTER — Telehealth: Payer: Self-pay | Admitting: *Deleted

## 2017-07-23 NOTE — Telephone Encounter (Signed)
Spoke with Crystal. Apparently patient has been getting refills for xanax that are being sent to [facility], prescriber Marletta [place]Julie Barr [place]. Is missing pantoprazole rx. Discussed that patient should not be on xanax and that pantoprazole was sent in 06/26/17. Patient needs appointment to be seen in clinic and full med rec performed. Crystal voiced good understanding and stated would call front desk to schedule. Of note, [place] CS RS reviewed and confirmed that xanax is being prescribed by Marletta LorJulie Barr NP.   Called office of Marletta LorJulie Barr at: [facility] [address] [place] [address] [phone]  Left message with secretary that [facility] assumed care of patient and that can call us with any questions.

## 2017-07-23 NOTE — Telephone Encounter (Signed)
Will forward to MD. Jazmin Hartsell,CMA  

## 2017-07-23 NOTE — Telephone Encounter (Signed)
Crystal, caregiver for patient, states she faxed Dr. Artist PaisYoo on 7/31 to determine what medications patient was to be taking and Dr. Artist PaisYoo faxed back that patient was to take medications prescribed at discharge. Crystal states medications sent to pharmacy was everything she was taking prior to hospitalization. Crystal would like a call back at [phone] from MD to clear up confusion, states patient uses [facility].

## 2017-08-03 ENCOUNTER — Telehealth: Payer: Self-pay | Admitting: Family Medicine

## 2017-08-03 NOTE — Telephone Encounter (Signed)
Received home health paperwork via Fax from [facility]. Patient has yet to establish at [facility]. Had discussed doing so with patient in person during her last hospital visit. Cannot complete form until patient does so.   Called patient's home and mobile that patient need to establish in [facility] but no message could be left due to no voicemail box. Called [name] and left message that patient needs appointment.    Called and spoke with Arline Asp RN at [facility] regarding orders. Discussed that could not sign off on this. [facility] voiced good understanding of this situation. They stated that they would be going to place the patient on hold pending evaluation by clinician (PCP).

## 2017-08-07 ENCOUNTER — Encounter (HOSPITAL_COMMUNITY): Payer: Self-pay | Admitting: *Deleted

## 2017-08-07 ENCOUNTER — Emergency Department (HOSPITAL_COMMUNITY)
Admission: EM | Admit: 2017-08-07 | Discharge: 2017-08-07 | Disposition: A | Discharge status: Home / Self Care | Payer: Medicare Other | Attending: Emergency Medicine | Admitting: Emergency Medicine

## 2017-08-07 DIAGNOSIS — N186 End stage renal disease: Secondary | ICD-10-CM | POA: Diagnosis not present

## 2017-08-07 DIAGNOSIS — I120 Hypertensive chronic kidney disease with stage 5 chronic kidney disease or end stage renal disease: Secondary | ICD-10-CM | POA: Diagnosis not present

## 2017-08-07 DIAGNOSIS — Z7982 Long term (current) use of aspirin: Secondary | ICD-10-CM | POA: Insufficient documentation

## 2017-08-07 DIAGNOSIS — Z9115 Patient's noncompliance with renal dialysis: Secondary | ICD-10-CM | POA: Insufficient documentation

## 2017-08-07 DIAGNOSIS — Z87891 Personal history of nicotine dependence: Secondary | ICD-10-CM | POA: Insufficient documentation

## 2017-08-07 DIAGNOSIS — Z79899 Other long term (current) drug therapy: Secondary | ICD-10-CM | POA: Diagnosis not present

## 2017-08-07 DIAGNOSIS — D649 Anemia, unspecified: Secondary | ICD-10-CM | POA: Diagnosis not present

## 2017-08-07 LAB — CBC
HCT: 24.7 % — ABNORMAL LOW (ref 36.0–46.0)
Hemoglobin: 7.9 g/dL — ABNORMAL LOW (ref 12.0–15.0)
MCH: 28.1 pg (ref 26.0–34.0)
MCHC: 32 g/dL (ref 30.0–36.0)
MCV: 87.9 fL (ref 78.0–100.0)
Platelets: 264 10*3/uL (ref 150–400)
RBC: 2.81 MIL/uL — ABNORMAL LOW (ref 3.87–5.11)
RDW: 16.8 % — ABNORMAL HIGH (ref 11.5–15.5)
WBC: 6.1 10*3/uL (ref 4.0–10.5)

## 2017-08-07 LAB — BASIC METABOLIC PANEL
ANION GAP: 13 (ref 5–15)
BUN: 82 mg/dL — ABNORMAL HIGH (ref 6–20)
CO2: 12 mmol/L — AB (ref 22–32)
Calcium: 8.6 mg/dL — ABNORMAL LOW (ref 8.9–10.3)
Chloride: 110 mmol/L (ref 101–111)
Creatinine, Ser: 12.87 mg/dL — ABNORMAL HIGH (ref 0.44–1.00)
GFR calc Af Amer: 3 mL/min — ABNORMAL LOW (ref >60)
GFR calc non Af Amer: 2 mL/min — ABNORMAL LOW (ref >60)
GLUCOSE: 91 mg/dL (ref 65–99)
POTASSIUM: 5.2 mmol/L — AB (ref 3.5–5.1)
Sodium: 135 mmol/L (ref 135–145)

## 2017-08-07 LAB — MAGNESIUM: Magnesium: 2 mg/dL (ref 1.7–2.4)

## 2017-08-07 MED ORDER — ACETAMINOPHEN 500 MG PO TABS
1000.0000 mg | ORAL_TABLET | Freq: Once | ORAL | Status: AC
  Administered 2017-08-07: 1000 mg via ORAL
  Filled 2017-08-07: qty 2

## 2017-08-07 NOTE — ED Notes (Signed)
Signature not available. Pt agreeable to discharge and understands her instructions

## 2017-08-07 NOTE — ED Provider Notes (Signed)
[facility]-EMERGENCY DEPT Provider Note   CSN: [id number] Arrival date & time: 08/07/17  1433     History   Chief Complaint Chief Complaint  Patient presents with  . Hypertension    HPI NAYDA RIESEN is a 83 y.o. female.  The history is provided by the patient and medical records.   This is an 83 year old female with PMH of ESRD on dialysis MWF who presents with headaches and after missing dialysis for 1 week.  The patient states she "hates people at dialysis" and therefore sometimes wishes to miss sessions.  Does not regret her decision, and fully understands the risks in skipping. Patient denies any leg swelling, chest pain, shortness of breath.  She stated she had blurry vision earlier this morning but it has resolved since arrival to the ED.  Denies any cough, fever.  She is not been taking any of her medications as she states she currently does not have a PCP and her medications cannot be filled until she sees one.  Past Medical History:  Diagnosis Date  . Abscess of buttock 12/29/2013  . Acute blood loss anemia 03/13/2017  . Acute delirium   . Anemia of renal disease 03/13/2017  . Anemia, chronic renal failure, stage 5 (HCC) 10/23/2016  . Anxiety   . Arthritis    "all over" (01/29/2017)  . Atrial flutter (HCC)   . Chronic back pain    "all my back" (01/29/2017)  . CKD (chronic kidney disease) stage 5, GFR less than 15 ml/min (HCC) 12/29/2013  . Constipation   . Dementia   . Depression   . Displaced fracture of right femoral neck (HCC)   . Elevated troponin   . Encounter for nasogastric (NG) tube placement   . Escherichia coli urinary tract infection 12/29/2013  . Escherichia coli urinary tract infection 12/29/2013  . ESRD (end stage renal disease) on dialysis [facility])    "MWF; Rudene Anda" (01/29/2017)  . ETOHism (HCC)    Quit in the 1980s.  . Gastric ulcer 12/2013.   Prepyloric ulcer. On follow-up EGD in 02/2014 this was 90% healed  . Gastric ulcer with hemorrhage 12/29/2013  .  GERD (gastroesophageal reflux disease)   . Headache    as a young woman  . Hemorrhoids   . History of alcohol dependence (HCC) 03/23/2017  . History of gastric ulcer 03/23/2017  . History of kidney stones   . History of stomach ulcers   . Hypertension   . Left sided chest pain 10/28/2016   See 10/28/16 she describes pleuritic-type pain on the left since a fall approximately 3 weeks ago Apparently she slipped in her tub without cardiac or neurologic prodrome  . Melena 12/29/2013  . Pancreatic cyst 09/2010.   At uncinate process.  Serial CT imaging favors benign process  . Protein-calorie malnutrition, severe 10/25/2016  . SBO (small bowel obstruction) (HCC)   . Unintentional weight loss 10/23/2016  . Upper GI bleed 12/28/2013    Patient Active Problem List   Diagnosis Date Noted  . Hyponatremia   . End-stage renal disease on hemodialysis (HCC)   . Hyperkalemia   . Accelerated hypertension   . End stage renal disease (HCC) 06/19/2017  . Anxiety state (neurotic) 03/25/2017  . History of total right hip arthroplasty 03/23/2017  . Impaired mobility and ADLs 03/23/2017  . Lives alone with some help available 03/23/2017  . Tooth pain 03/23/2017  . History of alcohol dependence (HCC) 03/23/2017  . History of gastric ulcer with bleeding 03/23/2017  .  Sacral decubitus ulcer, stage II   . Anemia of renal disease 03/13/2017  . Malnutrition of moderate degree 03/11/2017  . Closed displaced fracture of right femoral neck (HCC)   . ESRD on dialysis (HCC) 01/29/2017  . Possible Cognitive impairment 10/23/2016  . Pancreatic cyst   . HTN (hypertension) 12/29/2013  . PAF (paroxysmal atrial fibrillation) (HCC) 12/29/2013    Past Surgical History:  Procedure Laterality Date  . ABDOMINAL HYSTERECTOMY    . ANTERIOR APPROACH HEMI HIP ARTHROPLASTY Right 03/13/2017   Procedure: RIGHT TOTAL HIP ARTHROPLASTY;  Surgeon: Samson Frederic, MD;  Location: [facility] OR;  Service: Orthopedics;  Laterality: Right;    . ARTERIOVENOUS GRAFT PLACEMENT Left 01/29/2017   INSERTION OF ARTERIOVENOUS (AV) GORE-TEX GRAFT  STRETCHED IN LEFT FORE ARM   . AV FISTULA PLACEMENT Left 03/20/2015   Procedure: ARTERIOVENOUS (AV) FISTULA CREATION;  Surgeon: Chuck Hint, MD;  Location[facility] OR;  Service: Vascular;  Laterality: Left;  . AV FISTULA PLACEMENT Left 01/29/2017   Procedure: INSERTION OF ARTERIOVENOUS (AV) GORE-TEX GRAFT  STRETCHED IN LEFT FORE ARM;  Surgeon: Chuck Hint, MD;  Location: [facility] OR;  Service: Vascular;  Laterality: Left;  . AVGG REMOVAL Left 01/31/2017   Procedure: REMOVAL OF ARTERIOVENOUS GORETEX GRAFT (AVGG) WITH Vein patching.;  Surgeon: Fransisco Hertz, MD;  Location: [facility] OR;  Service: Vascular;  Laterality: Left;  . CATARACT EXTRACTION Right   . COLONOSCOPY  2010   Per Dr. Loreta Ave  . ESOPHAGOGASTRODUODENOSCOPY N/A 12/29/2013   Procedure: ESOPHAGOGASTRODUODENOSCOPY (EGD);  Surgeon: Hart Carwin, MD;  Location: [facility] ENDOSCOPY;  Service: Endoscopy;  Laterality: N/A;  . EXPLORATORY LAPAROTOMY  1980   w/LOA  . FLEXIBLE SIGMOIDOSCOPY  08/2011   Per Dr. Randa Evens. Normal study. Random biopsies positive for trace melanosis coli.  Marland Kitchen FOOT SURGERY Bilateral    "surgery for flat feet"  . INSERTION OF DIALYSIS CATHETER Right 11/25/2016   Procedure: INSERTION OF Right Internal jugular DIALYSIS CATHETER;  Surgeon: Fransisco Hertz, MD;  Location: [facility] OR;  Service: Vascular;  Laterality: Right;  . SHOULDER ARTHROSCOPY W/ ROTATOR CUFF REPAIR Right 08/2003   Hattie Perch 01/29/2017  . TIBIA FRACTURE SURGERY Right    "broke between my knee and my ankle"  . TOTAL ABDOMINAL HYSTERECTOMY W/ BILATERAL SALPINGOOPHORECTOMY  1980  . TUBAL LIGATION      OB History    No data available       Home Medications    Prior to Admission medications   Medication Sig Start Date End Date Taking? Authorizing Provider  acetaminophen (TYLENOL) 325 MG tablet Take 2 tablets (650 mg total) by mouth 3 (three) times daily. Patient not  taking: Reported on 06/19/2017 04/15/17   Erasmo Downer, MD  aspirin 81 MG EC tablet Take 1 tablet (81 mg total) by mouth daily. 06/26/17   Leland Her, DO  atorvastatin (LIPITOR) 40 MG tablet Take 1 tablet (40 mg total) by mouth daily. Patient not taking: Reported on 06/19/2017 06/18/17   Leland Her, DO  calcitRIOL (ROCALTROL) 0.25 MCG capsule Take 3 capsules (0.75 mcg total) by mouth every Monday, Wednesday, and Friday with hemodialysis. Patient not taking: Reported on 06/19/2017 04/15/17   Erasmo Downer, MD  calcium acetate (PHOSLO) 667 MG tablet Take 2 tablets (1,334 mg total) by mouth 3 (three) times daily with meals. Patient not taking: Reported on 06/19/2017 04/15/17   Erasmo Downer, MD  cholecalciferol (VITAMIN D) 1000 units tablet Take 1 tablet (1,000 Units total) by mouth  daily. Patient not taking: Reported on 06/19/2017 05/27/17   Leland HerYoo, Elsia J, DO  clopidogrel (PLAVIX) 75 MG tablet Take 1 tablet (75 mg total) by mouth daily. Patient not taking: Reported on 06/19/2017 05/27/17   Leland HerYoo, Elsia J, DO  metoprolol tartrate (LOPRESSOR) 25 MG tablet Take 1 tablet (25 mg total) by mouth 2 (two) times daily. Patient not taking: Reported on 06/19/2017 05/27/17   Leland HerYoo, Elsia J, DO  nitroGLYCERIN (NITROSTAT) 0.4 MG SL tablet Place 1 tablet (0.4 mg total) under the tongue every 5 (five) minutes as needed for chest pain. Patient not taking: Reported on 06/19/2017 04/15/17   Erasmo DownerBacigalupo, Angela M, MD  Nutritional Supplements (FEEDING SUPPLEMENT, NEPRO CARB STEADY,) LIQD Take 237 mLs by mouth 2 (two) times daily between meals. Patient not taking: Reported on 06/19/2017 04/15/17   Erasmo DownerBacigalupo, Angela M, MD  pantoprazole (PROTONIX) 40 MG tablet Take 1 tablet (40 mg total) by mouth daily. 06/26/17   Leland HerYoo, Elsia J, DO  traMADol (ULTRAM) 50 MG tablet Take 0.5 tablets (25 mg total) by mouth every 8 (eight) hours as needed. Patient not taking: Reported on 06/19/2017 05/31/17   Sharlene DoryWendling, Nicholas Paul, DO  venlafaxine  XR (EFFEXOR-XR) 75 MG 24 hr capsule Take 1 capsule (75 mg total) by mouth at bedtime. Patient not taking: Reported on 06/19/2017 05/27/17   Leland HerYoo, Elsia J, DO    Family History Family History  Problem Relation Age of Onset  . Alcohol abuse Mother     Social History Social History  Substance Use Topics  . Smoking status: Former Smoker    Packs/day: 0.50    Years: 32.00    Types: Cigarettes    Quit date: 02/14/1986  . Smokeless tobacco: Never Used  . Alcohol use Yes     Comment: 01/29/2017 "used to have a problem w/it; nothing since 1987"     Allergies   Aspirin and Codeine   Review of Systems Review of Systems  Constitutional: Negative for chills, diaphoresis and fever.  HENT: Negative for ear pain and sore throat.   Eyes: Negative for pain and visual disturbance.  Respiratory: Negative for cough and shortness of breath.   Cardiovascular: Negative for chest pain and palpitations.  Gastrointestinal: Negative for abdominal pain, blood in stool, constipation, diarrhea, nausea and vomiting.  Genitourinary: Negative for dysuria and hematuria.  Musculoskeletal: Negative for arthralgias, back pain, joint swelling, myalgias and neck stiffness.  Skin: Negative for color change and rash.  Neurological: Positive for headaches. Negative for dizziness, tremors, seizures, syncope, facial asymmetry, speech difficulty, weakness, light-headedness and numbness.  All other systems reviewed and are negative.    Physical Exam Updated Vital Signs BP 127/77   Pulse 73   Temp 98.3 F (36.8 C) (Oral)   Resp 16   SpO2 100%   Physical Exam  Constitutional: She is oriented to person, place, and time. She appears well-developed and well-nourished. No distress.  HENT:  Head: Normocephalic and atraumatic.  Eyes: Conjunctivae are normal.  Neck: Neck supple.  Cardiovascular: Normal rate and regular rhythm.   No murmur heard. Pulmonary/Chest: Effort normal and breath sounds normal. No respiratory  distress.  Abdominal: Soft. There is no tenderness.  Musculoskeletal: Normal range of motion. She exhibits no edema or tenderness.  Neurological: She is alert and oriented to person, place, and time. She has normal strength. She displays no tremor. No sensory deficit.  Skin: Skin is warm and dry.  Psychiatric: She has a normal mood and affect.  Nursing note and vitals  reviewed.  ED Treatments / Results  Labs (all labs ordered are listed, but only abnormal results are displayed) Labs Reviewed  CBC - Abnormal; Notable for the following:       Result Value   RBC 2.81 (*)    Hemoglobin 7.9 (*)    HCT 24.7 (*)    RDW 16.8 (*)    All other components within normal limits  BASIC METABOLIC PANEL - Abnormal; Notable for the following:    Potassium 5.2 (*)    CO2 12 (*)    BUN 82 (*)    Creatinine, Ser 12.87 (*)    Calcium 8.6 (*)    GFR calc non Af Amer 2 (*)    GFR calc Af Amer 3 (*)    All other components within normal limits  MAGNESIUM    EKG  EKG Interpretation  Date/Time:  Friday August 07 2017 16:33:08 EDT Ventricular Rate:  69 PR Interval:  208 QRS Duration: 86 QT Interval:  404 QTC Calculation: 432 R Axis:   39 Text Interpretation:  Normal sinus rhythm Normal ECG Confirmed by Margarita Grizzle [phone]) on 08/07/2017 6:34:00 PM       Radiology No results found.  Procedures Procedures (including critical care time)  Medications Ordered in ED Medications  acetaminophen (TYLENOL) tablet 1,000 mg (1,000 mg Oral Given 08/07/17 1624)     Initial Impression / Assessment and Plan / ED Course  I have reviewed the triage vital signs and the nursing notes.  Pertinent labs & imaging results that were available during my care of the patient were reviewed by me and considered in my medical decision making (see chart for details).     This is an 83 year old female with PMH of ESRD on dialysis MWF who presents with headaches and after missing dialysis for 1 week.  Blood  pressure 180/90 initially in triage.  Blood glucose on arrival 97, Improved to 140s over 80s on my initial assessment. Tylenol given for headache, Patient otherwise asymptomatic.  EKG reviewed. Sinus rhythm- no peaked T waves. CBC, BMP, magnesium.  Nephrology called given potassium 5.2, sodium 127. Patient does not meet criteria for urgent dialysis however she has not received treatment for a week now, has no PCP follow-up.   Nephrology arranged HD at patient's HD center tomorrow afternoon.  Importance of attending HD was relayed repeatedly to the patient. She is fully aware of the risks and stated she wished to wait till Monday for HD.   Return precautions given and all questions answered. Information given to establish care with PCP.  Final Clinical Impressions(s) / ED Diagnoses   Final diagnoses:  Dialysis patient, noncompliant [facility])   New Prescriptions New Prescriptions   No medications on file     Shaune Pollack, MD 08/07/17 Lynelle Smoke    Margarita Grizzle, MD 08/08/17 2018

## 2017-08-07 NOTE — ED Triage Notes (Signed)
Per EMS- her dialysis center call [employer] for a well person check. EMS was also called. Pt had not been to dialysis in 1 week and has not had medications in 1 week. Pt has hx of HTN and dialysis MWF. Pt only reports headache. 180/90 initial BP with EMS. CBG 97. HR 82

## 2017-09-10 ENCOUNTER — Emergency Department (HOSPITAL_COMMUNITY): Payer: Medicare Other

## 2017-09-10 ENCOUNTER — Inpatient Hospital Stay (HOSPITAL_COMMUNITY): Payer: Medicare Other

## 2017-09-10 ENCOUNTER — Encounter (HOSPITAL_COMMUNITY): Payer: Self-pay | Admitting: Emergency Medicine

## 2017-09-10 ENCOUNTER — Inpatient Hospital Stay (HOSPITAL_COMMUNITY)
Admission: EM | Admit: 2017-09-10 | Discharge: 2017-09-17 | DRG: 871 | Disposition: A | Discharge status: Skilled Nursing Facility | Payer: Medicare Other | Attending: Family Medicine | Admitting: Family Medicine

## 2017-09-10 DIAGNOSIS — Z6822 Body mass index (BMI) 22.0-22.9, adult: Secondary | ICD-10-CM

## 2017-09-10 DIAGNOSIS — I4891 Unspecified atrial fibrillation: Secondary | ICD-10-CM | POA: Diagnosis present

## 2017-09-10 DIAGNOSIS — Z09 Encounter for follow-up examination after completed treatment for conditions other than malignant neoplasm: Secondary | ICD-10-CM

## 2017-09-10 DIAGNOSIS — N186 End stage renal disease: Secondary | ICD-10-CM | POA: Diagnosis present

## 2017-09-10 DIAGNOSIS — Z87442 Personal history of urinary calculi: Secondary | ICD-10-CM | POA: Diagnosis not present

## 2017-09-10 DIAGNOSIS — Z7902 Long term (current) use of antithrombotics/antiplatelets: Secondary | ICD-10-CM

## 2017-09-10 DIAGNOSIS — E785 Hyperlipidemia, unspecified: Secondary | ICD-10-CM | POA: Diagnosis present

## 2017-09-10 DIAGNOSIS — G8929 Other chronic pain: Secondary | ICD-10-CM | POA: Diagnosis present

## 2017-09-10 DIAGNOSIS — M1990 Unspecified osteoarthritis, unspecified site: Secondary | ICD-10-CM | POA: Diagnosis present

## 2017-09-10 DIAGNOSIS — F419 Anxiety disorder, unspecified: Secondary | ICD-10-CM | POA: Diagnosis present

## 2017-09-10 DIAGNOSIS — R4182 Altered mental status, unspecified: Secondary | ICD-10-CM | POA: Diagnosis not present

## 2017-09-10 DIAGNOSIS — E43 Unspecified severe protein-calorie malnutrition: Secondary | ICD-10-CM | POA: Diagnosis present

## 2017-09-10 DIAGNOSIS — Z885 Allergy status to narcotic agent status: Secondary | ICD-10-CM

## 2017-09-10 DIAGNOSIS — Z66 Do not resuscitate: Secondary | ICD-10-CM | POA: Diagnosis present

## 2017-09-10 DIAGNOSIS — Z7189 Other specified counseling: Secondary | ICD-10-CM

## 2017-09-10 DIAGNOSIS — D631 Anemia in chronic kidney disease: Secondary | ICD-10-CM | POA: Diagnosis present

## 2017-09-10 DIAGNOSIS — Z9115 Patient's noncompliance with renal dialysis: Secondary | ICD-10-CM | POA: Diagnosis not present

## 2017-09-10 DIAGNOSIS — A498 Other bacterial infections of unspecified site: Secondary | ICD-10-CM | POA: Diagnosis present

## 2017-09-10 DIAGNOSIS — Z886 Allergy status to analgesic agent status: Secondary | ICD-10-CM

## 2017-09-10 DIAGNOSIS — Z515 Encounter for palliative care: Secondary | ICD-10-CM | POA: Diagnosis present

## 2017-09-10 DIAGNOSIS — E162 Hypoglycemia, unspecified: Secondary | ICD-10-CM | POA: Diagnosis present

## 2017-09-10 DIAGNOSIS — M549 Dorsalgia, unspecified: Secondary | ICD-10-CM | POA: Diagnosis present

## 2017-09-10 DIAGNOSIS — K219 Gastro-esophageal reflux disease without esophagitis: Secondary | ICD-10-CM | POA: Diagnosis present

## 2017-09-10 DIAGNOSIS — A419 Sepsis, unspecified organism: Secondary | ICD-10-CM | POA: Diagnosis not present

## 2017-09-10 DIAGNOSIS — R109 Unspecified abdominal pain: Secondary | ICD-10-CM | POA: Diagnosis present

## 2017-09-10 DIAGNOSIS — A4151 Sepsis due to Escherichia coli [E. coli]: Principal | ICD-10-CM | POA: Diagnosis present

## 2017-09-10 DIAGNOSIS — E875 Hyperkalemia: Secondary | ICD-10-CM | POA: Diagnosis present

## 2017-09-10 DIAGNOSIS — Z992 Dependence on renal dialysis: Secondary | ICD-10-CM

## 2017-09-10 DIAGNOSIS — N390 Urinary tract infection, site not specified: Secondary | ICD-10-CM | POA: Diagnosis present

## 2017-09-10 DIAGNOSIS — B9620 Unspecified Escherichia coli [E. coli] as the cause of diseases classified elsewhere: Secondary | ICD-10-CM | POA: Diagnosis not present

## 2017-09-10 DIAGNOSIS — G92 Toxic encephalopathy: Secondary | ICD-10-CM | POA: Diagnosis present

## 2017-09-10 DIAGNOSIS — I120 Hypertensive chronic kidney disease with stage 5 chronic kidney disease or end stage renal disease: Secondary | ICD-10-CM | POA: Diagnosis present

## 2017-09-10 DIAGNOSIS — E872 Acidosis, unspecified: Secondary | ICD-10-CM | POA: Diagnosis present

## 2017-09-10 DIAGNOSIS — G9340 Encephalopathy, unspecified: Secondary | ICD-10-CM | POA: Diagnosis present

## 2017-09-10 DIAGNOSIS — R7881 Bacteremia: Secondary | ICD-10-CM | POA: Diagnosis not present

## 2017-09-10 DIAGNOSIS — Z79899 Other long term (current) drug therapy: Secondary | ICD-10-CM

## 2017-09-10 DIAGNOSIS — F1021 Alcohol dependence, in remission: Secondary | ICD-10-CM | POA: Diagnosis present

## 2017-09-10 DIAGNOSIS — Z7982 Long term (current) use of aspirin: Secondary | ICD-10-CM

## 2017-09-10 DIAGNOSIS — Z87891 Personal history of nicotine dependence: Secondary | ICD-10-CM

## 2017-09-10 LAB — CBC WITH DIFFERENTIAL/PLATELET
BASOS PCT: 0 %
Basophils Absolute: 0 10*3/uL (ref 0.0–0.1)
EOS PCT: 0 %
Eosinophils Absolute: 0 10*3/uL (ref 0.0–0.7)
HEMATOCRIT: 29.3 % — AB (ref 36.0–46.0)
HEMOGLOBIN: 9.4 g/dL — AB (ref 12.0–15.0)
Lymphocytes Relative: 7 %
Lymphs Abs: 0.9 10*3/uL (ref 0.7–4.0)
MCH: 28.4 pg (ref 26.0–34.0)
MCHC: 32.1 g/dL (ref 30.0–36.0)
MCV: 88.5 fL (ref 78.0–100.0)
Monocytes Absolute: 0.8 10*3/uL (ref 0.1–1.0)
Monocytes Relative: 6 %
NEUTROS ABS: 11.3 10*3/uL — AB (ref 1.7–7.7)
Neutrophils Relative %: 87 %
Platelets: 227 10*3/uL (ref 150–400)
RBC: 3.31 MIL/uL — ABNORMAL LOW (ref 3.87–5.11)
RDW: 16.4 % — ABNORMAL HIGH (ref 11.5–15.5)
WBC: 13 10*3/uL — ABNORMAL HIGH (ref 4.0–10.5)

## 2017-09-10 LAB — CBC
HEMATOCRIT: 30.8 % — AB (ref 36.0–46.0)
Hemoglobin: 9.8 g/dL — ABNORMAL LOW (ref 12.0–15.0)
MCH: 28 pg (ref 26.0–34.0)
MCHC: 31.8 g/dL (ref 30.0–36.0)
MCV: 88 fL (ref 78.0–100.0)
Platelets: 234 10*3/uL (ref 150–400)
RBC: 3.5 MIL/uL — AB (ref 3.87–5.11)
RDW: 16 % — ABNORMAL HIGH (ref 11.5–15.5)
WBC: 10 10*3/uL (ref 4.0–10.5)

## 2017-09-10 LAB — CREATININE, SERUM
Creatinine, Ser: 18.6 mg/dL — ABNORMAL HIGH (ref 0.44–1.00)
GFR calc non Af Amer: 1 mL/min — ABNORMAL LOW (ref >60)
GFR, EST AFRICAN AMERICAN: 2 mL/min — AB (ref >60)

## 2017-09-10 LAB — GLUCOSE, CAPILLARY: GLUCOSE-CAPILLARY: 77 mg/dL (ref 65–99)

## 2017-09-10 LAB — COMPREHENSIVE METABOLIC PANEL
ALBUMIN: 3.7 g/dL (ref 3.5–5.0)
ALT: 10 U/L — ABNORMAL LOW (ref 14–54)
AST: 19 U/L (ref 15–41)
Alkaline Phosphatase: 94 U/L (ref 38–126)
BILIRUBIN TOTAL: 1.4 mg/dL — AB (ref 0.3–1.2)
BUN: 135 mg/dL — AB (ref 6–20)
CHLORIDE: 103 mmol/L (ref 101–111)
CO2: <7 mmol/L — ABNORMAL LOW (ref 22–32)
Calcium: 8.5 mg/dL — ABNORMAL LOW (ref 8.9–10.3)
Creatinine, Ser: 19.12 mg/dL — ABNORMAL HIGH (ref 0.44–1.00)
GFR calc Af Amer: 2 mL/min — ABNORMAL LOW (ref >60)
GFR calc non Af Amer: 1 mL/min — ABNORMAL LOW (ref >60)
GLUCOSE: 111 mg/dL — AB (ref 65–99)
Sodium: 134 mmol/L — ABNORMAL LOW (ref 135–145)
Total Protein: 7.4 g/dL (ref 6.5–8.1)

## 2017-09-10 LAB — PROCALCITONIN: Procalcitonin: 51.56 ng/mL

## 2017-09-10 LAB — ETHANOL

## 2017-09-10 LAB — TROPONIN I: Troponin I: 0.04 ng/mL (ref <0.03)

## 2017-09-10 LAB — I-STAT CG4 LACTIC ACID, ED: LACTIC ACID, VENOUS: 2.39 mmol/L — AB (ref 0.5–1.9)

## 2017-09-10 LAB — CBG MONITORING, ED: Glucose-Capillary: 108 mg/dL — ABNORMAL HIGH (ref 65–99)

## 2017-09-10 LAB — MRSA PCR SCREENING: MRSA by PCR: NEGATIVE

## 2017-09-10 LAB — CK: Total CK: 268 U/L — ABNORMAL HIGH (ref 38–234)

## 2017-09-10 LAB — LACTIC ACID, PLASMA: LACTIC ACID, VENOUS: 2.6 mmol/L — AB (ref 0.5–1.9)

## 2017-09-10 MED ORDER — PIPERACILLIN-TAZOBACTAM 3.375 G IVPB
3.3750 g | Freq: Two times a day (BID) | INTRAVENOUS | Status: DC
  Administered 2017-09-11 (×2): 3.375 g via INTRAVENOUS
  Filled 2017-09-10 (×3): qty 50

## 2017-09-10 MED ORDER — LIDOCAINE-PRILOCAINE 2.5-2.5 % EX CREA
1.0000 "application " | TOPICAL_CREAM | CUTANEOUS | Status: DC | PRN
  Filled 2017-09-10: qty 5

## 2017-09-10 MED ORDER — PIPERACILLIN-TAZOBACTAM 3.375 G IVPB 30 MIN
3.3750 g | Freq: Once | INTRAVENOUS | Status: AC
  Administered 2017-09-10: 3.375 g via INTRAVENOUS
  Filled 2017-09-10: qty 50

## 2017-09-10 MED ORDER — HEPARIN SODIUM (PORCINE) 1000 UNIT/ML DIALYSIS
1000.0000 [IU] | INTRAMUSCULAR | Status: DC | PRN
  Administered 2017-09-11: 1000 [IU] via INTRAVENOUS_CENTRAL
  Filled 2017-09-10: qty 1

## 2017-09-10 MED ORDER — SODIUM CHLORIDE 0.9 % IV SOLN: 250.0000 mL | INTRAVENOUS | Status: DC | PRN

## 2017-09-10 MED ORDER — LIDOCAINE HCL (PF) 1 % IJ SOLN: 5.0000 mL | INTRAMUSCULAR | Status: DC | PRN

## 2017-09-10 MED ORDER — SODIUM BICARBONATE 8.4 % IV SOLN
50.0000 meq | Freq: Once | INTRAVENOUS | Status: AC
  Administered 2017-09-10: 50 meq via INTRAVENOUS
  Filled 2017-09-10: qty 50

## 2017-09-10 MED ORDER — PANTOPRAZOLE SODIUM 40 MG IV SOLR
40.0000 mg | INTRAVENOUS | Status: DC
  Administered 2017-09-11: 40 mg via INTRAVENOUS
  Filled 2017-09-10: qty 40

## 2017-09-10 MED ORDER — CALCIUM GLUCONATE 10 % IV SOLN
1.0000 g | Freq: Once | INTRAVENOUS | Status: AC
  Administered 2017-09-10: 1 g via INTRAVENOUS
  Filled 2017-09-10: qty 10

## 2017-09-10 MED ORDER — PENTAFLUOROPROP-TETRAFLUOROETH EX AERO
1.0000 "application " | INHALATION_SPRAY | CUTANEOUS | Status: DC | PRN
  Filled 2017-09-10: qty 30

## 2017-09-10 MED ORDER — VANCOMYCIN HCL 10 G IV SOLR
1250.0000 mg | Freq: Once | INTRAVENOUS | Status: AC
  Administered 2017-09-10: 1250 mg via INTRAVENOUS
  Filled 2017-09-10: qty 1250

## 2017-09-10 MED ORDER — DEXTROSE 50 % IV SOLN
1.0000 | Freq: Once | INTRAVENOUS | Status: AC
  Administered 2017-09-10: 50 mL via INTRAVENOUS
  Filled 2017-09-10: qty 50

## 2017-09-10 MED ORDER — VANCOMYCIN HCL IN DEXTROSE 1-5 GM/200ML-% IV SOLN
1000.0000 mg | Freq: Once | INTRAVENOUS | Status: DC
  Filled 2017-09-10: qty 200

## 2017-09-10 MED ORDER — SODIUM CHLORIDE 0.9 % IV SOLN: 100.0000 mL | INTRAVENOUS | Status: DC | PRN

## 2017-09-10 MED ORDER — HEPARIN SODIUM (PORCINE) 5000 UNIT/ML IJ SOLN
5000.0000 [IU] | Freq: Three times a day (TID) | INTRAMUSCULAR | Status: DC
  Administered 2017-09-10 – 2017-09-11 (×2): 5000 [IU] via SUBCUTANEOUS
  Filled 2017-09-10 (×2): qty 1

## 2017-09-10 MED ORDER — INSULIN ASPART 100 UNIT/ML IV SOLN
10.0000 [IU] | Freq: Once | INTRAVENOUS | Status: AC
  Administered 2017-09-10: 10 [IU] via INTRAVENOUS
  Filled 2017-09-10: qty 0.1

## 2017-09-10 MED ORDER — ALTEPLASE 2 MG IJ SOLR: 2.0000 mg | Freq: Once | INTRAMUSCULAR | Status: DC | PRN

## 2017-09-10 NOTE — ED Triage Notes (Signed)
Per EMS: Pt is coming from. Family has not been able to reach her for a couple days, friend came over to check on her and found Pt laying on the floor.  Pt only alert to self. Pt missed her last two dialylis appts. Pt was uncooperative with EMS but not combative.  90's A-Fib CBG 114.

## 2017-09-10 NOTE — ED Provider Notes (Signed)
[facility]-EMERGENCY DEPT Provider Note   CSN: [id number] Arrival date & time: 09/10/17  1715     History   Chief Complaint Chief Complaint  Patient presents with  . Altered Mental Status    HPI SHENICA HOLZHEIMER is a 83 y.o. female.  83yo F w/ PMH including ESRD on HD, A fib, dementia who p/w AMS. EMS picked the patient up from home where family had not been able to reach her for several days and a friend came over to check on her this afternoon, found her laying on the floor and confused. Patient has been oriented only to self and is normally A&Ox3. EMS noted CBG 114. She has reportedly missed several dialysis sessions.  LEVEL 5 CAVEAT DUE TO AMS   The history is provided by a friend and the EMS personnel.  Altered Mental Status      Past Medical History:  Diagnosis Date  . Abscess of buttock 12/29/2013  . Acute blood loss anemia 03/13/2017  . Acute delirium   . Anemia of renal disease 03/13/2017  . Anemia, chronic renal failure, stage 5 (HCC) 10/23/2016  . Anxiety   . Arthritis    "all over" (01/29/2017)  . Atrial flutter (HCC)   . Chronic back pain    "all my back" (01/29/2017)  . CKD (chronic kidney disease) stage 5, GFR less than 15 ml/min (HCC) 12/29/2013  . Constipation   . Dementia   . Depression   . Displaced fracture of right femoral neck (HCC)   . Elevated troponin   . Encounter for nasogastric (NG) tube placement   . Escherichia coli urinary tract infection 12/29/2013  . Escherichia coli urinary tract infection 12/29/2013  . ESRD (end stage renal disease) on dialysis [facility])    "MWF; Rudene Anda" (01/29/2017)  . ETOHism (HCC)    Quit in the 1980s.  . Gastric ulcer 12/2013.   Prepyloric ulcer. On follow-up EGD in 02/2014 this was 90% healed  . Gastric ulcer with hemorrhage 12/29/2013  . GERD (gastroesophageal reflux disease)   . Headache    as a young woman  . Hemorrhoids   . History of alcohol dependence (HCC) 03/23/2017  . History of gastric ulcer 03/23/2017  .  History of kidney stones   . History of stomach ulcers   . Hypertension   . Left sided chest pain 10/28/2016   See 10/28/16 she describes pleuritic-type pain on the left since a fall approximately 3 weeks ago Apparently she slipped in her tub without cardiac or neurologic prodrome  . Melena 12/29/2013  . Pancreatic cyst 09/2010.   At uncinate process.  Serial CT imaging favors benign process  . Protein-calorie malnutrition, severe 10/25/2016  . SBO (small bowel obstruction) (HCC)   . Unintentional weight loss 10/23/2016  . Upper GI bleed 12/28/2013    Patient Active Problem List   Diagnosis Date Noted  . Hyponatremia   . End-stage renal disease on hemodialysis (HCC)   . Hyperkalemia   . Accelerated hypertension   . End stage renal disease (HCC) 06/19/2017  . Anxiety state (neurotic) 03/25/2017  . History of total right hip arthroplasty 03/23/2017  . Impaired mobility and ADLs 03/23/2017  . Lives alone with some help available 03/23/2017  . Tooth pain 03/23/2017  . History of alcohol dependence (HCC) 03/23/2017  . History of gastric ulcer with bleeding 03/23/2017  . Sacral decubitus ulcer, stage II   . Anemia of renal disease 03/13/2017  . Malnutrition of moderate degree 03/11/2017  .  Closed displaced fracture of right femoral neck (HCC)   . ESRD on dialysis (HCC) 01/29/2017  . Possible Cognitive impairment 10/23/2016  . Pancreatic cyst   . HTN (hypertension) 12/29/2013  . PAF (paroxysmal atrial fibrillation) (HCC) 12/29/2013    Past Surgical History:  Procedure Laterality Date  . ABDOMINAL HYSTERECTOMY    . ANTERIOR APPROACH HEMI HIP ARTHROPLASTY Right 03/13/2017   Procedure: RIGHT TOTAL HIP ARTHROPLASTY;  Surgeon: Samson Frederic, MD;  Location: [facility] OR;  Service: Orthopedics;  Laterality: Right;  . ARTERIOVENOUS GRAFT PLACEMENT Left 01/29/2017   INSERTION OF ARTERIOVENOUS (AV) GORE-TEX GRAFT  STRETCHED IN LEFT FORE ARM   . AV FISTULA PLACEMENT Left 03/20/2015   Procedure:  ARTERIOVENOUS (AV) FISTULA CREATION;  Surgeon: Chuck Hint, MD;  Location: [facility] OR;  Service: Vascular;  Laterality: Left;  . AV FISTULA PLACEMENT Left 01/29/2017   Procedure: INSERTION OF ARTERIOVENOUS (AV) GORE-TEX GRAFT  STRETCHED IN LEFT FORE ARM;  Surgeon: Chuck Hint, MD;  Location: [facility] OR;  Service: Vascular;  Laterality: Left;  . AVGG REMOVAL Left 01/31/2017   Procedure: REMOVAL OF ARTERIOVENOUS GORETEX GRAFT (AVGG) WITH Vein patching.;  Surgeon: Fransisco Hertz, MD;  Location: [facility] OR;  Service: Vascular;  Laterality: Left;  . CATARACT EXTRACTION Right   . COLONOSCOPY  2010   Per Dr. Loreta Ave  . ESOPHAGOGASTRODUODENOSCOPY N/A 12/29/2013   Procedure: ESOPHAGOGASTRODUODENOSCOPY (EGD);  Surgeon: Hart Carwin, MD;  Location: [facility] ENDOSCOPY;  Service: Endoscopy;  Laterality: N/A;  . EXPLORATORY LAPAROTOMY  1980   w/LOA  . FLEXIBLE SIGMOIDOSCOPY  08/2011   Per Dr. Randa Evens. Normal study. Random biopsies positive for trace melanosis coli.  Marland Kitchen FOOT SURGERY Bilateral    "surgery for flat feet"  . INSERTION OF DIALYSIS CATHETER Right 11/25/2016   Procedure: INSERTION OF Right Internal jugular DIALYSIS CATHETER;  Surgeon: Fransisco Hertz, MD;  Location: [facility] OR;  Service: Vascular;  Laterality: Right;  . SHOULDER ARTHROSCOPY W/ ROTATOR CUFF REPAIR Right 08/2003   Hattie Perch 01/29/2017  . TIBIA FRACTURE SURGERY Right    "broke between my knee and my ankle"  . TOTAL ABDOMINAL HYSTERECTOMY W/ BILATERAL SALPINGOOPHORECTOMY  1980  . TUBAL LIGATION      OB History    No data available       Home Medications    Prior to Admission medications   Medication Sig Start Date End Date Taking? Authorizing Provider  ALPRAZolam Prudy Feeler) 0.5 MG tablet Take 0.5 mg by mouth 2 (two) times daily. 08/18/17  Yes [provider]  atorvastatin (LIPITOR) 40 MG tablet Take 1 tablet (40 mg total) by mouth daily. 06/18/17  Yes Jeneen Rinks J, DO  metoprolol tartrate (LOPRESSOR) 25 MG tablet Take 1 tablet (25 mg  total) by mouth 2 (two) times daily. 05/27/17  Yes Jeneen Rinks J, DO  pantoprazole (PROTONIX) 40 MG tablet Take 1 tablet (40 mg total) by mouth daily. 06/26/17  Yes Leland Her, DO  acetaminophen (TYLENOL) 325 MG tablet Take 2 tablets (650 mg total) by mouth 3 (three) times daily. Patient not taking: Reported on 09/10/2017 04/15/17   Erasmo Downer, MD  aspirin 81 MG EC tablet Take 1 tablet (81 mg total) by mouth daily. 06/26/17   Leland Her, DO  calcitRIOL (ROCALTROL) 0.25 MCG capsule Take 3 capsules (0.75 mcg total) by mouth every Monday, Wednesday, and Friday with hemodialysis. 04/15/17   Erasmo Downer, MD  calcium acetate (PHOSLO) 667 MG tablet Take 2 tablets (1,334 mg total) by mouth  3 (three) times daily with meals. Patient not taking: Reported on 09/10/2017 04/15/17   Erasmo Downer, MD  cholecalciferol (VITAMIN D) 1000 units tablet Take 1 tablet (1,000 Units total) by mouth daily. Patient not taking: Reported on 09/10/2017 05/27/17   Leland Her, DO  clopidogrel (PLAVIX) 75 MG tablet Take 1 tablet (75 mg total) by mouth daily. Patient not taking: Reported on 09/10/2017 05/27/17   Leland Her, DO  nitroGLYCERIN (NITROSTAT) 0.4 MG SL tablet Place 1 tablet (0.4 mg total) under the tongue every 5 (five) minutes as needed for chest pain. 04/15/17   Bacigalupo, Marzella Schlein, MD  Nutritional Supplements (FEEDING SUPPLEMENT, NEPRO CARB STEADY,) LIQD Take 237 mLs by mouth 2 (two) times daily between meals. Patient not taking: Reported on 09/10/2017 04/15/17   Erasmo Downer, MD  traMADol (ULTRAM) 50 MG tablet Take 0.5 tablets (25 mg total) by mouth every 8 (eight) hours as needed. Patient not taking: Reported on 06/19/2017 05/31/17   Sharlene Dory, DO  venlafaxine XR (EFFEXOR-XR) 75 MG 24 hr capsule Take 1 capsule (75 mg total) by mouth at bedtime. Patient not taking: Reported on 09/10/2017 05/27/17   Leland Her, DO    Family History Family History  Problem Relation Age of Onset    . Alcohol abuse Mother     Social History Social History  Substance Use Topics  . Smoking status: Former Smoker    Packs/day: 0.50    Years: 32.00    Types: Cigarettes    Quit date: 02/14/1986  . Smokeless tobacco: Never Used  . Alcohol use Yes     Comment: 01/29/2017 "used to have a problem w/it; nothing since 1987"     Allergies   Aspirin and Codeine   Review of Systems Review of Systems  Unable to perform ROS: Mental status change     Physical Exam Updated Vital Signs BP (!) 160/61   Pulse 73   Temp (!) 94 F (34.4 C) (Rectal)   Resp 20   Ht  (1.651 m)   Wt 56.7 kg (125 lb)   SpO2 100%   BMI 20.80 kg/m   Physical Exam  Constitutional: She appears well-developed and well-nourished. No distress.  HENT:  Head: Normocephalic and atraumatic.  Eyes: Pupils are equal, round, and reactive to light. Conjunctivae are normal.  Neck: Neck supple.  Cardiovascular: Normal rate and regular rhythm.   Murmur heard. Pulmonary/Chest: Effort normal and breath sounds normal.  Abdominal: Soft. Bowel sounds are normal. She exhibits no distension. There is no tenderness.  Musculoskeletal: She exhibits no edema.  Neurological:  Awake, disoriented but moving all 4 extremities  Skin: Skin is warm and dry. No rash noted.  Vascath L upper chest w/ no surrounding redness or drainage  Nursing note and vitals reviewed.    ED Treatments / Results  Labs (all labs ordered are listed, but only abnormal results are displayed) Labs Reviewed  COMPREHENSIVE METABOLIC PANEL - Abnormal; Notable for the following:       Result Value   Sodium 134 (*)    Potassium >7.5 (*)    CO2 <7 (*)    Glucose, Bld 111 (*)    BUN 135 (*)    Creatinine, Ser 19.12 (*)    Calcium 8.5 (*)    ALT 10 (*)    Total Bilirubin 1.4 (*)    GFR calc non Af Amer 1 (*)    GFR calc Af Amer 2 (*)  All other components within normal limits  CBC WITH DIFFERENTIAL/PLATELET - Abnormal; Notable for the  following:    WBC 13.0 (*)    RBC 3.31 (*)    Hemoglobin 9.4 (*)    HCT 29.3 (*)    RDW 16.4 (*)    Neutro Abs 11.3 (*)    All other components within normal limits  CK - Abnormal; Notable for the following:    Total CK 268 (*)    All other components within normal limits  CBG MONITORING, ED - Abnormal; Notable for the following:    Glucose-Capillary 108 (*)    All other components within normal limits  I-STAT CG4 LACTIC ACID, ED - Abnormal; Notable for the following:    Lactic Acid, Venous 2.39 (*)    All other components within normal limits  CULTURE, BLOOD (ROUTINE X 2)  CULTURE, BLOOD (ROUTINE X 2)  CULTURE, BLOOD (SINGLE)  CULTURE, EXPECTORATED SPUTUM-ASSESSMENT  PROCALCITONIN  PROCALCITONIN  TROPONIN I  CBC  CREATININE, SERUM  LACTIC ACID, PLASMA  LACTIC ACID, PLASMA  I-STAT VENOUS BLOOD GAS, ED  I-STAT CHEM 8, ED    EKG  EKG Interpretation None       Radiology Ct Head Wo Contrast  Result Date: 09/10/2017 CLINICAL DATA:  Altered level of consciousness. EXAM: CT HEAD WITHOUT CONTRAST TECHNIQUE: Contiguous axial images were obtained from the base of the skull through the vertex without intravenous contrast. COMPARISON:  03/09/2017 FINDINGS: Brain: No evidence of acute infarction, hemorrhage, hydrocephalus, extra-axial collection or mass lesion/mass effect.Prominence of the sulci and ventricles are identified and appears unchanged when compared with previous exam. Low attenuation within the subcortical and periventricular white matter compatible with chronic small vessel ischemic change. Vascular: No hyperdense vessel or unexpected calcification. Skull: Normal. Negative for fracture or focal lesion. Sinuses/Orbits: No acute finding. Other: None. IMPRESSION: 1. No acute intracranial abnormalities. 2. Chronic atrophy and small vessel ischemic change. Electronically Signed   By: Signa Kell M.D.   On: 09/10/2017 18:46   Dg Chest Port 1 View  Result Date:  09/10/2017 CLINICAL DATA:  Sepsis.  Missed dialysis. EXAM: PORTABLE CHEST 1 VIEW COMPARISON:  03/09/2017. FINDINGS: Low lung volumes on this AP portable semi erect film. Dialysis catheter tips at the cavoatrial junction. Cardiomegaly. Mild vascular congestion, borderline edema. No pneumothorax. Degenerative change both shoulders. IMPRESSION: Cardiomegaly. Mild vascular congestion, borderline edema. Worsening aeration from priors. Electronically Signed   By: Elsie Stain M.D.   On: 09/10/2017 18:26    Procedures .Critical Care Performed by: Laurence Spates Authorized by: Laurence Spates   Critical care provider statement:    Critical care time (minutes):  60   Critical care time was exclusive of:  Separately billable procedures and treating other patients   Critical care was necessary to treat or prevent imminent or life-threatening deterioration of the following conditions:  Metabolic crisis and sepsis   Critical care was time spent personally by me on the following activities:  Development of treatment plan with patient or surrogate, discussions with consultants, evaluation of patient's response to treatment, examination of patient, obtaining history from patient or surrogate, ordering and performing treatments and interventions, ordering and review of laboratory studies, ordering and review of radiographic studies, re-evaluation of patient's condition and review of old charts   (including critical care time)  Medications Ordered in ED Medications  piperacillin-tazobactam (ZOSYN) IVPB 3.375 g (not administered)  pentafluoroprop-tetrafluoroeth (GEBAUERS) aerosol 1 application (not administered)  lidocaine (PF) (XYLOCAINE) 1 % injection 5 mL (  not administered)  lidocaine-prilocaine (EMLA) cream 1 application (not administered)  0.9 %  sodium chloride infusion (not administered)  0.9 %  sodium chloride infusion (not administered)  heparin injection 1,000 Units (not administered)   alteplase (CATHFLO ACTIVASE) injection 2 mg (not administered)  0.9 %  sodium chloride infusion (not administered)  heparin injection 5,000 Units (not administered)  pantoprazole (PROTONIX) injection 40 mg (not administered)  piperacillin-tazobactam (ZOSYN) IVPB 3.375 g (0 g Intravenous Stopped 09/10/17 1846)  insulin aspart (novoLOG) injection 10 Units (10 Units Intravenous Given 09/10/17 1835)  dextrose 50 % solution 50 mL (50 mLs Intravenous Given 09/10/17 1855)  dextrose 50 % solution 50 mL (50 mLs Intravenous Given 09/10/17 1808)  vancomycin (VANCOCIN) 1,250 mg in sodium chloride 0.9 % 250 mL IVPB (0 mg Intravenous Stopped 09/10/17 2014)  calcium gluconate 1 g in sodium chloride 0.9 % 100 mL IVPB (0 g Intravenous Stopped 09/10/17 1912)  sodium bicarbonate injection 50 mEq (50 mEq Intravenous Given 09/10/17 1917)     Initial Impression / Assessment and Plan / ED Course  I have reviewed the triage vital signs and the nursing notes.  Pertinent labs & imaging results that were available during my care of the patient were reviewed by me and considered in my medical decision making (see chart for details).     Pt w/ ESRD on HD, has missed a few dialysis sessions, found laying on floor by friend. On arrival she was awake, confused and disoriented but protecting airway. Rectal temp 94. Initiated code sepsis w/ blood cultures, Vanc and Zosyn. Did not give fluids as patient had oxygen requirement and has missed dialysis, thus volume overload very likely. Initial lactate 2.4, WBC 13, potassium greater than 8, hemoglobin 9.8, troponin 0.04. I reviewed her EKG which shows peaked T waves but no QRS widening. Gave the patient calcium, glucose, and insulin to stabilize prior to emergent dialysis. Contacted nephrology and discussed with Dr. Signe Colt, I appreciate her assistance. She has seen pt in ED and will arrange for emergent dialysis tonight.  Patient's initial VBG very concerning with venous pH of 6.8, CO2  21. I contacted critical care and discussed with Dr. Darrick Penna. NP [place] evaluated pt, she will admit to ICU while patient awaits dialysis, given extreme hyperkalemia and sepsis.   Final Clinical Impressions(s) / ED Diagnoses   Final diagnoses:  Abdominal pain    New Prescriptions New Prescriptions   No medications on file     Joangel Vanosdol, Ambrose Finland, MD 09/11/17 [phone]

## 2017-09-10 NOTE — ED Notes (Signed)
Mucus suctioned from Pt mouth.

## 2017-09-10 NOTE — Consult Note (Signed)
Reason for Consult: To manage dialysis and dialysis related needs Referring Physician: Dr. Clarene Duke, EDP  Monica [name] is an 83 y.o. female.   HPI: Pt is an 9F with ESRD on HD who is chronically noncompliant, HTN, HLD, h/o R displaced fem neck fracture s/p repair, EtOH dependence who is now seen in consultation at the request of Dr. Frederick Peers for provision of HD and management of ESRD.    Pt is unable to provide history due to altered mental status.    According to her family, she has not gone to dialysis since 08/31/17 where she stayed 3 hrs 39 min of her 4 hr 15 min treatment.  She "got really bad" on Monday and was calling out names of family members who weren't in the room.    She has been weak and has been falling.  iSTAT in the ED showing pH 6.86/ 21/ 154 along with a Na 133 K > 8.5 CO2 7 BUN 120 Cr > 18  Dialyzes at [facility] 4 hr 15 min 2K/2Ca EDW 56.5 kg F180 dialyzer BFR 400 DFR 800 L IJ TDC Sensipar 120 mg TIW; heparin 1800 u bolus, Venofer 50 q week, mircera 150 q 2 weeks, Calcitriol 2.25 mcg q treatment   Past Medical History:  Diagnosis Date  . Abscess of buttock 12/29/2013  . Acute blood loss anemia 03/13/2017  . Acute delirium   . Anemia of renal disease 03/13/2017  . Anemia, chronic renal failure, stage 5 (HCC) 10/23/2016  . Anxiety   . Arthritis    "all over" (01/29/2017)  . Atrial flutter (HCC)   . Chronic back pain    "all my back" (01/29/2017)  . CKD (chronic kidney disease) stage 5, GFR less than 15 ml/min (HCC) 12/29/2013  . Constipation   . Dementia   . Depression   . Displaced fracture of right femoral neck (HCC)   . Elevated troponin   . Encounter for nasogastric (NG) tube placement   . Escherichia coli urinary tract infection 12/29/2013  . Escherichia coli urinary tract infection 12/29/2013  . ESRD (end stage renal disease) on dialysis [facility])    "MWF; Rudene Anda" (01/29/2017)  . ETOHism (HCC)    Quit in the 1980s.  . Gastric ulcer 12/2013.   Prepyloric  ulcer. On follow-up EGD in 02/2014 this was 90% healed  . Gastric ulcer with hemorrhage 12/29/2013  . GERD (gastroesophageal reflux disease)   . Headache    as a young woman  . Hemorrhoids   . History of alcohol dependence (HCC) 03/23/2017  . History of gastric ulcer 03/23/2017  . History of kidney stones   . History of stomach ulcers   . Hypertension   . Left sided chest pain 10/28/2016   See 10/28/16 she describes pleuritic-type pain on the left since a fall approximately 3 weeks ago Apparently she slipped in her tub without cardiac or neurologic prodrome  . Melena 12/29/2013  . Pancreatic cyst 09/2010.   At uncinate process.  Serial CT imaging favors benign process  . Protein-calorie malnutrition, severe 10/25/2016  . SBO (small bowel obstruction) (HCC)   . Unintentional weight loss 10/23/2016  . Upper GI bleed 12/28/2013    Past Surgical History:  Procedure Laterality Date  . ABDOMINAL HYSTERECTOMY    . ANTERIOR APPROACH HEMI HIP ARTHROPLASTY Right 03/13/2017   Procedure: RIGHT TOTAL HIP ARTHROPLASTY;  Surgeon: Samson Frederic, MD;  Location: [facility] OR;  Service: Orthopedics;  Laterality: Right;  . ARTERIOVENOUS GRAFT PLACEMENT Left 01/29/2017  INSERTION OF ARTERIOVENOUS (AV) GORE-TEX GRAFT  STRETCHED IN LEFT FORE ARM   . AV FISTULA PLACEMENT Left 03/20/2015   Procedure: ARTERIOVENOUS (AV) FISTULA CREATION;  Surgeon: Chuck Hint, MD;  [facility] OR;  Service: Vascular;  Laterality: Left;  . AV FISTULA PLACEMENT Left 01/29/2017   Procedure: INSERTION OF ARTERIOVENOUS (AV) GORE-TEX GRAFT  STRETCHED IN LEFT FORE ARM;  Surgeon: Chuck Hint, MD;  Location: [facility] OR;  Service: Vascular;  Laterality: Left;  . AVGG REMOVAL Left 01/31/2017   Procedure: REMOVAL OF ARTERIOVENOUS GORETEX GRAFT (AVGG) WITH Vein patching.;  Surgeon: Fransisco Hertz, MD;  Location: [facility] OR;  Service: Vascular;  Laterality: Left;  . CATARACT EXTRACTION Right   . COLONOSCOPY  2010   Per Dr. Loreta Ave  .  ESOPHAGOGASTRODUODENOSCOPY N/A 12/29/2013   Procedure: ESOPHAGOGASTRODUODENOSCOPY (EGD);  Surgeon: Hart Carwin, MD;  Location: [facility] ENDOSCOPY;  Service: Endoscopy;  Laterality: N/A;  . EXPLORATORY LAPAROTOMY  1980   w/LOA  . FLEXIBLE SIGMOIDOSCOPY  08/2011   Per Dr. Randa Evens. Normal study. Random biopsies positive for trace melanosis coli.  Marland Kitchen FOOT SURGERY Bilateral    "surgery for flat feet"  . INSERTION OF DIALYSIS CATHETER Right 11/25/2016   Procedure: INSERTION OF Right Internal jugular DIALYSIS CATHETER;  Surgeon: Fransisco Hertz, MD;  Location: [facility] OR;  Service: Vascular;  Laterality: Right;  . SHOULDER ARTHROSCOPY W/ ROTATOR CUFF REPAIR Right 08/2003   Hattie Perch 01/29/2017  . TIBIA FRACTURE SURGERY Right    "broke between my knee and my ankle"  . TOTAL ABDOMINAL HYSTERECTOMY W/ BILATERAL SALPINGOOPHORECTOMY  1980  . TUBAL LIGATION      Family History  Problem Relation Age of Onset  . Alcohol abuse Mother     Social History:  reports that she quit smoking about 31 years ago. Her smoking use included Cigarettes. She has a 16.00 pack-year smoking history. She has never used smokeless tobacco. She reports that she drinks alcohol. She reports that she does not use drugs.  Allergies:  Allergies  Allergen Reactions  . Aspirin Other (See Comments)    "makes my stomach hurt"  . Codeine Other (See Comments)    "i get irritated" Patient tolerates Tramadol without difficulty.     Medications: I have reviewed the patient's current medications.   Results for orders placed or performed during the hospital encounter of 09/10/17 (from the past 48 hour(s))  CBG monitoring, ED     Status: Abnormal   Collection Time: 09/10/17  5:39 PM  Result Value Ref Range   Glucose-Capillary 108 (H) 65 - 99 mg/dL  CBC WITH DIFFERENTIAL     Status: Abnormal (Preliminary result)   Collection Time: 09/10/17  5:43 PM  Result Value Ref Range   WBC PENDING 4.0 - 10.5 K/uL   RBC 3.31 (L) 3.87 - 5.11 MIL/uL    Hemoglobin 9.4 (L) 12.0 - 15.0 g/dL   HCT 29.5 (L) 28.4 - 13.2 %   MCV 88.5 78.0 - 100.0 fL   MCH 28.4 26.0 - 34.0 pg   MCHC 32.1 30.0 - 36.0 g/dL   RDW 44.0 (H) 10.2 - 72.5 %   Platelets PENDING 150 - 400 K/uL   Neutrophils Relative % PENDING %   Neutro Abs PENDING 1.7 - 7.7 K/uL   Band Neutrophils PENDING %   Lymphocytes Relative PENDING %   Lymphs Abs PENDING 0.7 - 4.0 K/uL   Monocytes Relative PENDING %   Monocytes Absolute PENDING 0.1 - 1.0 K/uL   Eosinophils  Relative PENDING %   Eosinophils Absolute PENDING 0.0 - 0.7 K/uL   Basophils Relative PENDING %   Basophils Absolute PENDING 0.0 - 0.1 K/uL   WBC Morphology PENDING    RBC Morphology PENDING    Smear Review PENDING    nRBC PENDING 0 /100 WBC   Metamyelocytes Relative PENDING %   Myelocytes PENDING %   Promyelocytes Absolute PENDING %   Blasts PENDING %  I-Stat CG4 Lactic Acid, ED  (not at  [facility])     Status: Abnormal   Collection Time: 09/10/17  6:03 PM  Result Value Ref Range   Lactic Acid, Venous 2.39 (HH) 0.5 - 1.9 mmol/L   Comment NOTIFIED PHYSICIAN     Ct Head Wo Contrast  Result Date: 09/10/2017 CLINICAL DATA:  Altered level of consciousness. EXAM: CT HEAD WITHOUT CONTRAST TECHNIQUE: Contiguous axial images were obtained from the base of the skull through the vertex without intravenous contrast. COMPARISON:  03/09/2017 FINDINGS: Brain: No evidence of acute infarction, hemorrhage, hydrocephalus, extra-axial collection or mass lesion/mass effect.Prominence of the sulci and ventricles are identified and appears unchanged when compared with previous exam. Low attenuation within the subcortical and periventricular white matter compatible with chronic small vessel ischemic change. Vascular: No hyperdense vessel or unexpected calcification. Skull: Normal. Negative for fracture or focal lesion. Sinuses/Orbits: No acute finding. Other: None. IMPRESSION: 1. No acute intracranial abnormalities. 2. Chronic atrophy and small  vessel ischemic change. Electronically Signed   By: Signa Kell M.D.   On: 09/10/2017 18:46   Dg Chest Port 1 View  Result Date: 09/10/2017 CLINICAL DATA:  Sepsis.  Missed dialysis. EXAM: PORTABLE CHEST 1 VIEW COMPARISON:  03/09/2017. FINDINGS: Low lung volumes on this AP portable semi erect film. Dialysis catheter tips at the cavoatrial junction. Cardiomegaly. Mild vascular congestion, borderline edema. No pneumothorax. Degenerative change both shoulders. IMPRESSION: Cardiomegaly. Mild vascular congestion, borderline edema. Worsening aeration from priors. Electronically Signed   By: Elsie Stain M.D.   On: 09/10/2017 18:26    ROS: unobtainable due to AMS Blood pressure (!) 130/57, pulse 68, temperature (!) 94 F (34.4 C), temperature source Rectal, resp. rate (!) 24, height  (1.651 m), weight 56.7 kg (125 lb), SpO2 97 %. .  GEN ill appearing HEENT sclerae icteric, dry MM NECK L IJ TDC in place, clear tegaderm over it, no biopatch, c/d/i PULM tachypneic, clear anteriorly CV tachycardic, II/VI systolic murmur ABD + diffuse tenderness especially in suprapubic area with some guarding.  Midline well healed surgical scar EXT no LE edema NEURO + asterixis SKIN no uremic frost present; poor skin turgor  Assessment/Plan: 1 Acute encephalopathy: Uremia a primary driver, CT head negative, must also consider sepsis.  Vanc/ zosyn per EDP and primary service.  Expect MS to improve with dialysis and with detection of underlying conditions, if there are any. 2.  Mixed nongap and gap metabolic anion gap acidosis: severe.  Lactate 2.39, not driver.  Expect to improve with dialysis 3.  Hyperkalemia: expect to improve with dialysis 4. ESRD: noncompliant.  Will provide emergent dialysis tonight and will need serial dialysis as well. 1K bath first hr of treatment and then 2K.  2 hr treatment tonight  5 Hypertension: not an issue now, will resume home meds when appropriate 6 Anemia of ESRD: ESA as  appropriate 7  Metabolic Bone Disease: binders when eating 8 Abd pain: on exam.  Consider imaging (AXR vs CT)  Jacquiline Zurcher 09/10/2017, 6:56 PM

## 2017-09-10 NOTE — ED Notes (Signed)
Patient attempting to get out of the bed.  Patient instructed that they needed to remain in the bed.  Door is open, bed in lowest locked position.

## 2017-09-10 NOTE — Progress Notes (Signed)
Pharmacy Antibiotic Note  Monica [name] is a 83 y.o. female admitted on 09/10/2017 with sepsis.  Pharmacy has been consulted for vancomycin and zosyn dosing. PMH ESRD- HD MWF. CBC pending, Afebrile, LA 2.39. Noted patient missed 2 dialysis this week.   Plan: Vancomycin LD of 1250 mg IV X1, then  IV with each dialysis session (MWF)- maintenance doses not yet entered. Zosyn 3.375g IV X1 over 30 minutes in the ED, then 3.375gm IV every 12 hours (4 hour infusion). Pre-HD  Vancomycin level as indicated, monitor for c/s, clinical resolution,  F/u de-escalation plan/LOT, F/u inpatient HD schedule/tolerance for entering maintenance doses   Height:  (165.1 cm) Weight: 125 lb (56.7 kg) IBW/kg (Calculated) : 57  Temp (24hrs), Avg:94 F (34.4 C), Min:94 F (34.4 C), Max:94 F (34.4 C)   Recent Labs Lab 09/10/17 1743 09/10/17 1803  WBC PENDING  --   LATICACIDVEN  --  2.39*    CrCl cannot be calculated (Patient's most recent lab result is older than the maximum 21 days allowed.).    Allergies  Allergen Reactions  . Aspirin Other (See Comments)    "makes my stomach hurt"  . Codeine Other (See Comments)    "i get irritated" Patient tolerates Tramadol without difficulty.        Thank you for allowing pharmacy to be a part of this patient's care.  Emeline General, PharmD Candidate 09/10/2017 6:40 PM

## 2017-09-10 NOTE — H&P (Signed)
Name: Monica [name] MRN: [id number] DOB: [birth year]    ADMISSION DATE:  09/10/2017 CONSULTATION DATE:  09/10/2017  REFERRING MD :  Dr. Clarene Duke   CHIEF COMPLAINT:  Encephalopathic/Hyperkalemic   HISTORY OF PRESENT ILLNESS:  83 year old female with ESRD on HD on MWF, HTN, Anemia of Chronic Diease  Presents to ED on 10/4 after family/friends were unable to reach her for a few days. Family states that patient has not been to HD since 9/24. She has been complaining of leg pain and usually will take bus to dialysis. Uses a walker and lives home alone with a care giver who comes by daily. Son states over last few months patient has been increasingly confused and has questioned that patient has dementia. Upon arrival to ED K 7.5, CO2 <7, LA 2.39. BP 127/47, temp  94.0. Nephrology consulted. PCCM asked to admit.   PAST MEDICAL HISTORY :   has a past medical history of Abscess of buttock (12/29/2013); Acute blood loss anemia (03/13/2017); Acute delirium; Anemia of renal disease (03/13/2017); Anemia, chronic renal failure, stage 5 (HCC) (10/23/2016); Anxiety; Arthritis; Atrial flutter (HCC); Chronic back pain; CKD (chronic kidney disease) stage 5, GFR less than 15 ml/min (HCC) (12/29/2013); Constipation; Dementia; Depression; Displaced fracture of right femoral neck (HCC); Elevated troponin; Encounter for nasogastric (NG) tube placement; Escherichia coli urinary tract infection (12/29/2013); Escherichia coli urinary tract infection (12/29/2013); ESRD (end stage renal disease) on dialysis (HCC); ETOHism (HCC); Gastric ulcer (12/2013.); Gastric ulcer with hemorrhage (12/29/2013); GERD (gastroesophageal reflux disease); Headache; Hemorrhoids; History of alcohol dependence (HCC) (03/23/2017); History of gastric ulcer (03/23/2017); History of kidney stones; History of stomach ulcers; Hypertension; Left sided chest pain (10/28/2016); Melena (12/29/2013); Pancreatic cyst (09/2010.); Protein-calorie malnutrition, severe  (10/25/2016); SBO (small bowel obstruction) (HCC); Unintentional weight loss (10/23/2016); and Upper GI bleed (12/28/2013).  has a past surgical history that includes Esophagogastroduodenoscopy (N/A, 12/29/2013); Colonoscopy (2010); Flexible sigmoidoscopy (08/2011); Total abdominal hysterectomy w/ bilateral salpingoophorectomy (1980); AV fistula placement (Left, 03/20/2015); Insertion of dialysis catheter (Right, 11/25/2016); Abdominal hysterectomy; Tubal ligation; Exploratory laparotomy (1980); Arteriovenous graft placement (Left, 01/29/2017); Tibia fracture surgery (Right); Foot surgery (Bilateral); Cataract extraction (Right); Shoulder arthroscopy w/ rotator cuff repair (Right, 08/2003); AV fistula placement (Left, 01/29/2017); Arteriovenous goretex graft removal (Left, 01/31/2017); and Anterior approach hemi hip arthroplasty (Right, 03/13/2017). Prior to Admission medications   Medication Sig Start Date End Date Taking? Authorizing Provider  ALPRAZolam Prudy Feeler) 0.5 MG tablet Take 0.5 mg by mouth 2 (two) times daily. 08/18/17  Yes [provider]  atorvastatin (LIPITOR) 40 MG tablet Take 1 tablet (40 mg total) by mouth daily. 06/18/17  Yes Jeneen Rinks J, DO  metoprolol tartrate (LOPRESSOR) 25 MG tablet Take 1 tablet (25 mg total) by mouth 2 (two) times daily. 05/27/17  Yes Jeneen Rinks J, DO  pantoprazole (PROTONIX) 40 MG tablet Take 1 tablet (40 mg total) by mouth daily. 06/26/17  Yes Leland Her, DO  acetaminophen (TYLENOL) 325 MG tablet Take 2 tablets (650 mg total) by mouth 3 (three) times daily. Patient not taking: Reported on 09/10/2017 04/15/17   Erasmo Downer, MD  aspirin 81 MG EC tablet Take 1 tablet (81 mg total) by mouth daily. 06/26/17   Leland Her, DO  calcitRIOL (ROCALTROL) 0.25 MCG capsule Take 3 capsules (0.75 mcg total) by mouth every Monday, Wednesday, and Friday with hemodialysis. 04/15/17   Erasmo Downer, MD  calcium acetate (PHOSLO) 667 MG tablet Take 2 tablets (1,334 mg  total) by mouth 3 (  three) times daily with meals. Patient not taking: Reported on 09/10/2017 04/15/17   Erasmo Downer, MD  cholecalciferol (VITAMIN D) 1000 units tablet Take 1 tablet (1,000 Units total) by mouth daily. Patient not taking: Reported on 09/10/2017 05/27/17   Leland Her, DO  clopidogrel (PLAVIX) 75 MG tablet Take 1 tablet (75 mg total) by mouth daily. Patient not taking: Reported on 09/10/2017 05/27/17   Leland Her, DO  nitroGLYCERIN (NITROSTAT) 0.4 MG SL tablet Place 1 tablet (0.4 mg total) under the tongue every 5 (five) minutes as needed for chest pain. 04/15/17   Bacigalupo, Marzella Schlein, MD  Nutritional Supplements (FEEDING SUPPLEMENT, NEPRO CARB STEADY,) LIQD Take 237 mLs by mouth 2 (two) times daily between meals. Patient not taking: Reported on 09/10/2017 04/15/17   Erasmo Downer, MD  traMADol (ULTRAM) 50 MG tablet Take 0.5 tablets (25 mg total) by mouth every 8 (eight) hours as needed. Patient not taking: Reported on 06/19/2017 05/31/17   Sharlene Dory, DO  venlafaxine XR (EFFEXOR-XR) 75 MG 24 hr capsule Take 1 capsule (75 mg total) by mouth at bedtime. Patient not taking: Reported on 09/10/2017 05/27/17   Leland Her, DO   Allergies  Allergen Reactions  . Aspirin Other (See Comments)    "makes my stomach hurt"  . Codeine Other (See Comments)    "i get irritated" Patient tolerates Tramadol without difficulty.     FAMILY HISTORY:  family history includes Alcohol abuse in her mother. SOCIAL HISTORY:  reports that she quit smoking about 31 years ago. Her smoking use included Cigarettes. She has a 16.00 pack-year smoking history. She has never used smokeless tobacco. She reports that she drinks alcohol. She reports that she does not use drugs.  REVIEW OF SYSTEMS:   Unable to review as patient is encephalic   SUBJECTIVE:   VITAL SIGNS: Temp:  [94 F (34.4 C)] 94 F (34.4 C) (10/04 1751) Pulse Rate:  [57-91] 78 (10/04 1945) Resp:  [15-30] 24 (10/04  1945) BP: (116-151)/(45-134) 151/53 (10/04 1945) SpO2:  [83 %-100 %] 100 % (10/04 1945) Weight:  [56.7 kg (125 lb)] 56.7 kg (125 lb) (10/04 1721)  PHYSICAL EXAMINATION: General:  Elderly female, no distress  Neuro:  Alert, does not follow commands, non-verbal, grunts  HEENT:  Dry MMM  Cardiovascular:  RRR, no MRG  Lungs:  Crackles to bases, labored Abdomen:  Tender, Non-distended, active bowel sounds  Musculoskeletal:  +1 BLE  Skin:  Dry, intact    Recent Labs Lab 09/10/17 1743  NA 134*  K >7.5*  CL 103  CO2 <7*  BUN 135*  CREATININE 19.12*  GLUCOSE 111*    Recent Labs Lab 09/10/17 1743  HGB 9.4*  HCT 29.3*  WBC 13.0*  PLT 227   Ct Head Wo Contrast  Result Date: 09/10/2017 CLINICAL DATA:  Altered level of consciousness. EXAM: CT HEAD WITHOUT CONTRAST TECHNIQUE: Contiguous axial images were obtained from the base of the skull through the vertex without intravenous contrast. COMPARISON:  03/09/2017 FINDINGS: Brain: No evidence of acute infarction, hemorrhage, hydrocephalus, extra-axial collection or mass lesion/mass effect.Prominence of the sulci and ventricles are identified and appears unchanged when compared with previous exam. Low attenuation within the subcortical and periventricular white matter compatible with chronic small vessel ischemic change. Vascular: No hyperdense vessel or unexpected calcification. Skull: Normal. Negative for fracture or focal lesion. Sinuses/Orbits: No acute finding. Other: None. IMPRESSION: 1. No acute intracranial abnormalities. 2. Chronic atrophy and small vessel ischemic change. Electronically  Signed   By: Signa Kell M.D.   On: 09/10/2017 18:46   Dg Chest Port 1 View  Result Date: 09/10/2017 CLINICAL DATA:  Sepsis.  Missed dialysis. EXAM: PORTABLE CHEST 1 VIEW COMPARISON:  03/09/2017. FINDINGS: Low lung volumes on this AP portable semi erect film. Dialysis catheter tips at the cavoatrial junction. Cardiomegaly. Mild vascular congestion,  borderline edema. No pneumothorax. Degenerative change both shoulders. IMPRESSION: Cardiomegaly. Mild vascular congestion, borderline edema. Worsening aeration from priors. Electronically Signed   By: Elsie Stain M.D.   On: 09/10/2017 18:26    SIGNIFICANT EVENTS  10/4 > Presents to ED   STUDIES:  CXR 10/4 > Cardiomegaly. Mild vascular congestion, borderline edema. CT Head 10/4 > No acute intracranial abnormalities. Chronic atrophy and small vessel ischemic change.  MICROBIOLOGY:  Sputum 10/4 >> Blood per HD Cath 10/4 >>  Blood 10/4 >>   LINES/TUBES: Permcath L Chest >>   ASSESSMENT / PLAN:  Respiratory Insufficieny in setting of encephalopathy  Plan  -Monitor  -Maintain Oxygen Saturation >92  -Pulmonary Hygiene   H/O HTN  Plan  -Cardiac Monitoring  -Hold Lipitor, Lopressor, Plavix, ASA   Hyperkalemia  Mixed Non-gap/gap metabolic acidosis  ESRD on HD MWF (non-compliant)  LA 2.5 >  K 7.5 >  Plan  -Nephrology consulted > Emergent HD  -Trend BMP -Trend Lactic Acid   Anemia of Chronic Disease  Plan  -Trend CBC  -Transfuse for Hbg <7 -Heparin SQ > VTE Prop   Leukocytosis  Hypothermia  Plan  -Follow Culture Data  -Trend WBC and Fever Curve  -Trend LA and PCT  -Continue Vancomycin and Zosyn   GERD  ABD Pain  Plan  -PPI -NPO > until mental status improves  -KUB pending   Uremic vs Metabolic  Encephalopathy  H/O ETOH  -CT Head Negative  Plan  -Monitor  -ETOH pending  -Hold Xanax   CC Time: 40 minutes   Jovita Kussmaul, AGACNP-BC [facility]  Pgr: [phone]  PCCM Pgr: [phone]

## 2017-09-10 NOTE — ED Notes (Signed)
Pt on bear hugger.

## 2017-09-11 ENCOUNTER — Encounter (HOSPITAL_COMMUNITY): Payer: Self-pay

## 2017-09-11 ENCOUNTER — Inpatient Hospital Stay (HOSPITAL_COMMUNITY): Payer: Medicare Other

## 2017-09-11 LAB — BASIC METABOLIC PANEL
ANION GAP: 27 — AB (ref 5–15)
BUN: 138 mg/dL — ABNORMAL HIGH (ref 6–20)
BUN: 106 mg/dL — ABNORMAL HIGH (ref 6–20)
CALCIUM: 8.4 mg/dL — AB (ref 8.9–10.3)
CHLORIDE: 101 mmol/L (ref 101–111)
CO2: 10 mmol/L — AB (ref 22–32)
CREATININE: 14.34 mg/dL — AB (ref 0.44–1.00)
Calcium: 8.1 mg/dL — ABNORMAL LOW (ref 8.9–10.3)
Chloride: 104 mmol/L (ref 101–111)
Creatinine, Ser: 18.38 mg/dL — ABNORMAL HIGH (ref 0.44–1.00)
GFR calc Af Amer: 2 mL/min — ABNORMAL LOW (ref >60)
GFR calc non Af Amer: 1 mL/min — ABNORMAL LOW (ref >60)
GFR calc non Af Amer: 2 mL/min — ABNORMAL LOW (ref >60)
GFR, EST AFRICAN AMERICAN: 2 mL/min — AB (ref >60)
Glucose, Bld: 78 mg/dL (ref 65–99)
Glucose, Bld: 73 mg/dL (ref 65–99)
POTASSIUM: 5.2 mmol/L — AB (ref 3.5–5.1)
Potassium: >7.5 mmol/L (ref 3.5–5.1)
SODIUM: 138 mmol/L (ref 135–145)
Sodium: 137 mmol/L (ref 135–145)

## 2017-09-11 LAB — COMPREHENSIVE METABOLIC PANEL
ALK PHOS: 63 U/L (ref 38–126)
ALT: 16 U/L (ref 14–54)
AST: 33 U/L (ref 15–41)
Albumin: 2.9 g/dL — ABNORMAL LOW (ref 3.5–5.0)
Anion gap: 26 — ABNORMAL HIGH (ref 5–15)
BUN: 113 mg/dL — AB (ref 6–20)
CHLORIDE: 103 mmol/L (ref 101–111)
CO2: 10 mmol/L — AB (ref 22–32)
CREATININE: 15.21 mg/dL — AB (ref 0.44–1.00)
Calcium: 7.5 mg/dL — ABNORMAL LOW (ref 8.9–10.3)
GFR calc Af Amer: 2 mL/min — ABNORMAL LOW (ref >60)
GFR, EST NON AFRICAN AMERICAN: 2 mL/min — AB (ref >60)
GLUCOSE: 75 mg/dL (ref 65–99)
Potassium: 4.7 mmol/L (ref 3.5–5.1)
SODIUM: 139 mmol/L (ref 135–145)
Total Bilirubin: 1.2 mg/dL (ref 0.3–1.2)
Total Protein: 6.2 g/dL — ABNORMAL LOW (ref 6.5–8.1)

## 2017-09-11 LAB — BLOOD CULTURE ID PANEL (REFLEXED)
ACINETOBACTER BAUMANNII: NOT DETECTED
CANDIDA ALBICANS: NOT DETECTED
CANDIDA PARAPSILOSIS: NOT DETECTED
CANDIDA TROPICALIS: NOT DETECTED
CARBAPENEM RESISTANCE: NOT DETECTED
Candida glabrata: NOT DETECTED
Candida krusei: NOT DETECTED
Enterobacter cloacae complex: NOT DETECTED
Enterobacteriaceae species: DETECTED — AB
Enterococcus species: NOT DETECTED
Escherichia coli: DETECTED — AB
HAEMOPHILUS INFLUENZAE: NOT DETECTED
KLEBSIELLA PNEUMONIAE: NOT DETECTED
Klebsiella oxytoca: NOT DETECTED
Listeria monocytogenes: NOT DETECTED
Neisseria meningitidis: NOT DETECTED
PROTEUS SPECIES: NOT DETECTED
Pseudomonas aeruginosa: NOT DETECTED
STAPHYLOCOCCUS AUREUS BCID: NOT DETECTED
STREPTOCOCCUS SPECIES: NOT DETECTED
Serratia marcescens: NOT DETECTED
Staphylococcus species: NOT DETECTED
Streptococcus agalactiae: NOT DETECTED
Streptococcus pneumoniae: NOT DETECTED
Streptococcus pyogenes: NOT DETECTED

## 2017-09-11 LAB — C DIFFICILE QUICK SCREEN W PCR REFLEX
C Diff antigen: NEGATIVE
C Diff interpretation: NOT DETECTED
C Diff toxin: NEGATIVE

## 2017-09-11 LAB — GLUCOSE, CAPILLARY
GLUCOSE-CAPILLARY: 66 mg/dL (ref 65–99)
GLUCOSE-CAPILLARY: 142 mg/dL — AB (ref 65–99)
GLUCOSE-CAPILLARY: 90 mg/dL (ref 65–99)
GLUCOSE-CAPILLARY: 66 mg/dL (ref 65–99)
GLUCOSE-CAPILLARY: 78 mg/dL (ref 65–99)
Glucose-Capillary: 32 mg/dL — CL (ref 65–99)
Glucose-Capillary: 48 mg/dL — ABNORMAL LOW (ref 65–99)
Glucose-Capillary: 66 mg/dL (ref 65–99)
Glucose-Capillary: 70 mg/dL (ref 65–99)
Glucose-Capillary: 72 mg/dL (ref 65–99)
Glucose-Capillary: 75 mg/dL (ref 65–99)

## 2017-09-11 LAB — CBC
HCT: 30.8 % — ABNORMAL LOW (ref 36.0–46.0)
HCT: 28 % — ABNORMAL LOW (ref 36.0–46.0)
HEMOGLOBIN: 10.2 g/dL — AB (ref 12.0–15.0)
Hemoglobin: 9.8 g/dL — ABNORMAL LOW (ref 12.0–15.0)
MCH: 28.4 pg (ref 26.0–34.0)
MCH: 28.7 pg (ref 26.0–34.0)
MCHC: 33.1 g/dL (ref 30.0–36.0)
MCHC: 35 g/dL (ref 30.0–36.0)
MCV: 81.2 fL (ref 78.0–100.0)
MCV: 86.5 fL (ref 78.0–100.0)
PLATELETS: 198 10*3/uL (ref 150–400)
Platelets: 247 10*3/uL (ref 150–400)
RBC: 3.45 MIL/uL — AB (ref 3.87–5.11)
RBC: 3.56 MIL/uL — AB (ref 3.87–5.11)
RDW: 16 % — ABNORMAL HIGH (ref 11.5–15.5)
RDW: 15.7 % — ABNORMAL HIGH (ref 11.5–15.5)
WBC: 4.8 10*3/uL (ref 4.0–10.5)
WBC: 13.4 10*3/uL — AB (ref 4.0–10.5)

## 2017-09-11 LAB — CORTISOL: Cortisol, Plasma: >100 ug/dL

## 2017-09-11 LAB — PHOSPHORUS
PHOSPHORUS: 8.4 mg/dL — AB (ref 2.5–4.6)
Phosphorus: 11.7 mg/dL — ABNORMAL HIGH (ref 2.5–4.6)

## 2017-09-11 LAB — MAGNESIUM
MAGNESIUM: 1.9 mg/dL (ref 1.7–2.4)
MAGNESIUM: 2.3 mg/dL (ref 1.7–2.4)

## 2017-09-11 LAB — POTASSIUM: Potassium: 4.9 mmol/L (ref 3.5–5.1)

## 2017-09-11 LAB — PROCALCITONIN: PROCALCITONIN: 80.51 ng/mL

## 2017-09-11 LAB — OCCULT BLOOD X 1 CARD TO LAB, STOOL: FECAL OCCULT BLD: POSITIVE — AB

## 2017-09-11 LAB — LACTIC ACID, PLASMA: Lactic Acid, Venous: 2.1 mmol/L (ref 0.5–1.9)

## 2017-09-11 MED ORDER — DEXTROSE 50 % IV SOLN
25.0000 mL | Freq: Once | INTRAVENOUS | Status: AC
  Administered 2017-09-11: 25 mL via INTRAVENOUS

## 2017-09-11 MED ORDER — DEXTROSE 10 % IV SOLN: INTRAVENOUS | Status: DC

## 2017-09-11 MED ORDER — ORAL CARE MOUTH RINSE
15.0000 mL | Freq: Two times a day (BID) | OROMUCOSAL | Status: DC
  Administered 2017-09-12 – 2017-09-16 (×9): 15 mL via OROMUCOSAL

## 2017-09-11 MED ORDER — DEXTROSE 50 % IV SOLN
INTRAVENOUS | Status: AC
  Administered 2017-09-11: 25 mL
  Filled 2017-09-11: qty 50

## 2017-09-11 MED ORDER — ALBUMIN HUMAN 25 % IV SOLN
25.0000 g | Freq: Once | INTRAVENOUS | Status: AC
  Administered 2017-09-11: 25 g via INTRAVENOUS
  Filled 2017-09-11: qty 100

## 2017-09-11 MED ORDER — CHLORHEXIDINE GLUCONATE 0.12 % MT SOLN
15.0000 mL | Freq: Two times a day (BID) | OROMUCOSAL | Status: DC
  Administered 2017-09-11 – 2017-09-12 (×2): 15 mL via OROMUCOSAL
  Filled 2017-09-11: qty 15

## 2017-09-11 MED ORDER — PANTOPRAZOLE SODIUM 40 MG IV SOLR
40.0000 mg | Freq: Two times a day (BID) | INTRAVENOUS | Status: DC
  Administered 2017-09-11 – 2017-09-13 (×4): 40 mg via INTRAVENOUS
  Filled 2017-09-11 (×4): qty 40

## 2017-09-11 MED ORDER — SODIUM BICARBONATE 8.4 % IV SOLN
INTRAVENOUS | Status: DC
  Administered 2017-09-11: 02:00:00 via INTRAVENOUS
  Filled 2017-09-11 (×2): qty 150

## 2017-09-11 MED ORDER — HYDROCORTISONE NA SUCCINATE PF 100 MG IJ SOLR
50.0000 mg | Freq: Four times a day (QID) | INTRAMUSCULAR | Status: DC
  Filled 2017-09-11: qty 2

## 2017-09-11 MED ORDER — DEXTROSE 50 % IV SOLN: 1.0000 | Freq: Once | INTRAVENOUS | Status: DC

## 2017-09-11 MED ORDER — DEXTROSE 50 % IV SOLN
INTRAVENOUS | Status: AC
  Filled 2017-09-11: qty 50

## 2017-09-11 MED ORDER — DEXTROSE 50 % IV SOLN
1.0000 | Freq: Once | INTRAVENOUS | Status: AC
  Administered 2017-09-11: 50 mL via INTRAVENOUS
  Filled 2017-09-11: qty 50

## 2017-09-11 MED ORDER — THIAMINE HCL 100 MG/ML IJ SOLN
100.0000 mg | Freq: Every day | INTRAMUSCULAR | Status: DC
  Administered 2017-09-11 – 2017-09-13 (×3): 100 mg via INTRAVENOUS
  Filled 2017-09-11 (×3): qty 2

## 2017-09-11 MED ORDER — DEXTROSE 10 % IV SOLN
INTRAVENOUS | Status: DC
  Administered 2017-09-11 – 2017-09-15 (×3): via INTRAVENOUS
  Filled 2017-09-11: qty 1000

## 2017-09-11 MED ORDER — ORAL CARE MOUTH RINSE
15.0000 mL | Freq: Two times a day (BID) | OROMUCOSAL | Status: DC
  Administered 2017-09-11 (×2): 15 mL via OROMUCOSAL

## 2017-09-11 NOTE — Progress Notes (Signed)
[facility] Progress Note  Subjective:pt remains stuporous, not responding to questions.   Vitals:   09/11/17 0500 09/11/17 0600 09/11/17 0700 09/11/17 0800  BP: 119/71 (!) 146/79 138/64 115/66  Pulse: (!) 167 (!) 130 (!) 129 (!) 128  Resp: (!) 25 (!) 29 (!) 24 (!) 27  Temp:    98.3 F (36.8 C)  TempSrc:    Oral  SpO2: 92% 100% 100% 100%  Weight:      Height:        Inpatient medications: . [COMPLETED] dextrose      . heparin  5,000 Units Subcutaneous Q8H  . hydrocortisone sod succinate (SOLU-CORTEF) inj  50 mg Intravenous Q6H  . mouth rinse  15 mL Mouth Rinse BID  . pantoprazole (PROTONIX) IV  40 mg Intravenous Q24H  . thiamine injection  100 mg Intravenous Daily   . sodium chloride    . sodium chloride    . sodium chloride    . piperacillin-tazobactam (ZOSYN)  IV Stopped (09/11/17 1610)  .  sodium bicarbonate  infusion 1000 mL 75 mL/hr at 09/11/17 0800   sodium chloride, sodium chloride, sodium chloride, alteplase, heparin, lidocaine (PF), lidocaine-prilocaine, pentafluoroprop-tetrafluoroeth  Exam: Lethargic, lying in bed , eyes half-open +JVD Chest clear bilat RRR, 2/6 sem Abd soft ntnd +bs Ext question 1+ LE edema vs fat tissue Confused and moving ext x 4  Dialysis: [facility]   4h   2/2  56.5kg   L IJ TDC   Hep 1800 -sensipar 120 tiw -venofer 50/wk  -mircera 150 every 2 wks -calcitriol 2.25 tiw      Impression: 1. Altered mental status- due to uremia.  Missed HD 3-4 times I believe.  Severe acidosis and confusion, hyperkalemia better today after short HD yest.   2. Tachycardia - septic, prob cause 3. GNR sepsis - growing EColi in the blood cx's.  On IV abx 4. Hyperkalemia - better 5. Volume - up 4kg, +IS edema on CXR 6. Anemia of CKD - esa when needed 7. MBD - cont meds  Plan - HD again today. No family here.  May need pall care input.    Vinson Moselle MD  [facility] pager [phone]   09/11/2017, 11:44 AM    Recent  Labs Lab 09/10/17 2344 09/11/17 0120 09/11/17 0345 09/11/17 1015  NA 137  --  138 139  K >7.5* 4.9 5.2* 4.7  CL 104  --  101 103  CO2 <7*  --  10* 10*  GLUCOSE 78  --  73 75  BUN 138*  --  106* 113*  CREATININE 18.38*  --  14.34* 15.21*  CALCIUM 8.4*  --  8.1* 7.5*  PHOS 11.7*  --  8.4*  --     Recent Labs Lab 09/10/17 1743 09/11/17 1015  AST 19 33  ALT 10* 16  ALKPHOS 94 63  BILITOT 1.4* 1.2  PROT 7.4 6.2*  ALBUMIN 3.7 2.9*    Recent Labs Lab 09/10/17 1743 09/10/17 2028 09/10/17 2344  WBC 13.0* 10.0 4.8  NEUTROABS 11.3*  --   --   HGB 9.4* 9.8* 10.2*  HCT 29.3* 30.8* 30.8*  MCV 88.5 88.0 86.5  PLT 227 234 247   Iron/TIBC/Ferritin/ %Sat    Component Value Date/Time   IRON 29 03/16/2017 1625   TIBC 195 (L) 03/16/2017 1625   FERRITIN 387 (H) 03/16/2017 1625   IRONPCTSAT 15 03/16/2017 1625

## 2017-09-11 NOTE — Progress Notes (Signed)
eLink Physician-Brief Progress Note Patient Name: Monica [name] DOB: 09-Sep-1932 MRN: [id number]   Date of Service  09/11/2017  HPI/Events of Note  Hypoglycemia  eICU Interventions  D10 at 52     Intervention Category Major Interventions: Other:  YACOUB,WESAM 09/11/2017, 9:25 PM

## 2017-09-11 NOTE — Progress Notes (Signed)
Arrived to patient room 4N-28 at 1515.  Reviewed treatment plan and this RN agrees with plan.  Report received from bedside RN, Orpha Bur.  Consent verified.  Patient Responds to voice, oriented to person only.   Lung sounds diminished with rhonchi to ausculation in all fields. BLE 1+ edema. Cardiac:  ST.  Removed caps and cleansed LIJ catheter with chlorhedxidine.  Aspirated ports of heparin and flushed them with saline per protocol.  Connected and secured lines, initiated treatment at 1545.  UF Goal of 2000 mL and net fluid removal 1.5 L.  Will continue to monitor.

## 2017-09-11 NOTE — Progress Notes (Signed)
MD aware of HR still being in 130's.  Another 25G of albumin ordered and given.  Will continue to monitor.

## 2017-09-11 NOTE — Progress Notes (Signed)
PULMONARY / CRITICAL CARE MEDICINE   Name: Monica [name] MRN: [id number] DOB: January 21, 1932    ADMISSION DATE:  09/10/2017   HISTORY OF PRESENT ILLNESS:   Chronic dialysis patient who has missed several sesasions presenting with altered mental satatus  PAST MEDICAL HISTORY :  She  has a past medical history of Abscess of buttock (12/29/2013); Acute blood loss anemia (03/13/2017); Acute delirium; Anemia of renal disease (03/13/2017); Anemia, chronic renal failure, stage 5 (HCC) (10/23/2016); Anxiety; Arthritis; Atrial flutter (HCC); Chronic back pain; CKD (chronic kidney disease) stage 5, GFR less than 15 ml/min (HCC) (12/29/2013); Constipation; Dementia; Depression; Displaced fracture of right femoral neck (HCC); Elevated troponin; Encounter for nasogastric (NG) tube placement; Escherichia coli urinary tract infection (12/29/2013); Escherichia coli urinary tract infection (12/29/2013); ESRD (end stage renal disease) on dialysis (HCC); ETOHism (HCC); Gastric ulcer (12/2013.); Gastric ulcer with hemorrhage (12/29/2013); GERD (gastroesophageal reflux disease); Headache; Hemorrhoids; History of alcohol dependence (HCC) (03/23/2017); History of gastric ulcer (03/23/2017); History of kidney stones; History of stomach ulcers; Hypertension; Left sided chest pain (10/28/2016); Melena (12/29/2013); Pancreatic cyst (09/2010.); Protein-calorie malnutrition, severe (10/25/2016); SBO (small bowel obstruction) (HCC); Unintentional weight loss (10/23/2016); and Upper GI bleed (12/28/2013).  PAST SURGICAL HISTORY: She  has a past surgical history that includes Esophagogastroduodenoscopy (N/A, 12/29/2013); Colonoscopy (2010); Flexible sigmoidoscopy (08/2011); Total abdominal hysterectomy w/ bilateral salpingoophorectomy (1980); AV fistula placement (Left, 03/20/2015); Insertion of dialysis catheter (Right, 11/25/2016); Abdominal hysterectomy; Tubal ligation; Exploratory laparotomy (1980); Arteriovenous graft placement (Left, 01/29/2017);  Tibia fracture surgery (Right); Foot surgery (Bilateral); Cataract extraction (Right); Shoulder arthroscopy w/ rotator cuff repair (Right, 08/2003); AV fistula placement (Left, 01/29/2017); Arteriovenous goretex graft removal (Left, 01/31/2017); and Anterior approach hemi hip arthroplasty (Right, 03/13/2017).  Allergies  Allergen Reactions  . Aspirin Other (See Comments)    "makes my stomach hurt"  . Codeine Other (See Comments)    "i get irritated" Patient tolerates Tramadol without difficulty.     No current facility-administered medications on file prior to encounter.    Current Outpatient Prescriptions on File Prior to Encounter  Medication Sig  . atorvastatin (LIPITOR) 40 MG tablet Take 1 tablet (40 mg total) by mouth daily.  . metoprolol tartrate (LOPRESSOR) 25 MG tablet Take 1 tablet (25 mg total) by mouth 2 (two) times daily.  . pantoprazole (PROTONIX) 40 MG tablet Take 1 tablet (40 mg total) by mouth daily.  Marland Kitchen acetaminophen (TYLENOL) 325 MG tablet Take 2 tablets (650 mg total) by mouth 3 (three) times daily. (Patient not taking: Reported on 09/10/2017)  . aspirin 81 MG EC tablet Take 1 tablet (81 mg total) by mouth daily.  . calcitRIOL (ROCALTROL) 0.25 MCG capsule Take 3 capsules (0.75 mcg total) by mouth every Monday, Wednesday, and Friday with hemodialysis.  Marland Kitchen calcium acetate (PHOSLO) 667 MG tablet Take 2 tablets (1,334 mg total) by mouth 3 (three) times daily with meals. (Patient not taking: Reported on 09/10/2017)  . cholecalciferol (VITAMIN D) 1000 units tablet Take 1 tablet (1,000 Units total) by mouth daily. (Patient not taking: Reported on 09/10/2017)  . clopidogrel (PLAVIX) 75 MG tablet Take 1 tablet (75 mg total) by mouth daily. (Patient not taking: Reported on 09/10/2017)  . nitroGLYCERIN (NITROSTAT) 0.4 MG SL tablet Place 1 tablet (0.4 mg total) under the tongue every 5 (five) minutes as needed for chest pain.  . Nutritional Supplements (FEEDING SUPPLEMENT, NEPRO CARB STEADY,)  LIQD Take 237 mLs by mouth 2 (two) times daily between meals. (Patient not taking: Reported on 09/10/2017)  . traMADol (  ULTRAM) 50 MG tablet Take 0.5 tablets (25 mg total) by mouth every 8 (eight) hours as needed. (Patient not taking: Reported on 06/19/2017)  . venlafaxine XR (EFFEXOR-XR) 75 MG 24 hr capsule Take 1 capsule (75 mg total) by mouth at bedtime. (Patient not taking: Reported on 09/10/2017)    FAMILY HISTORY:  Her indicated that her mother is deceased. She indicated that her father is deceased.    SOCIAL HISTORY: She  reports that she quit smoking about 31 years ago. Her smoking use included Cigarettes. She has a 16.00 pack-year smoking history. She has never used smokeless tobacco. She reports that she drinks alcohol. She reports that she does not use drugs.  REVIEW OF SYSTEMS:   unobtainable  SUBJECTIVE:   Very lethargic, only intermittent one word responses  VITAL SIGNS: BP 115/66 (BP Location: Right Arm)   Pulse (!) 128   Temp 98.3 F (36.8 C) (Oral)   Resp (!) 27   Ht  (1.651 m)   Wt 133 lb 6.1 oz (60.5 kg)   SpO2 100%   BMI 22.20 kg/m   HEMODYNAMICS:    VENTILATOR SETTINGS:    INTAKE / OUTPUT: I/O last 3 completed shifts: In: 596.3 [I.V.:296.3; IV Piggyback:300] Out: -456   PHYSICAL EXAMINATION: General:  Chronically ill appearing, heavy, elderly female Neuro:  Very lethargic. She does not complete sentences when awakened. Pupils equal. Does not follow instructions. Withdraws all 4's  Cardiovascular: RRR without m g or rub Lungs:  Unlabored despite acidosis, symmetric air movement, no wheezes Abdomen:  Reproducible RUQ tenderness without guarding or mass. I cannot appreciate the liver edge  Skin:  No lesions of back or buttocks by report Left subclavian HD catheter looks good LABS:  BMET  Recent Labs Lab 09/10/17 1743 09/10/17 2028 09/10/17 2344 09/11/17 0120 09/11/17 0345  NA 134*  --  137  --  138  K >7.5*  --  >7.5* 4.9 5.2*  CL  103  --  104  --  101  CO2 <7*  --  <7*  --  10*  BUN 135*  --  138*  --  106*  CREATININE 19.12* 18.60* 18.38*  --  14.34*  GLUCOSE 111*  --  78  --  73    Electrolytes  Recent Labs Lab 09/10/17 1743 09/10/17 2344 09/11/17 0345  CALCIUM 8.5* 8.4* 8.1*  MG  --  2.3 1.9  PHOS  --  11.7* 8.4*    CBC  Recent Labs Lab 09/10/17 1743 09/10/17 2028 09/10/17 2344  WBC 13.0* 10.0 4.8  HGB 9.4* 9.8* 10.2*  HCT 29.3* 30.8* 30.8*  PLT 227 234 247    Coag's No results for input(s): APTT, INR in the last 168 hours.  Sepsis Markers  Recent Labs Lab 09/10/17 1803 09/10/17 2028 09/10/17 2029 09/10/17 2344  LATICACIDVEN 2.39*  --  2.6* 2.1*  PROCALCITON  --  51.56  --  80.51    ABG No results for input(s): PHART, PCO2ART, PO2ART in the last 168 hours.  Liver Enzymes  Recent Labs Lab 09/10/17 1743  AST 19  ALT 10*  ALKPHOS 94  BILITOT 1.4*  ALBUMIN 3.7    Cardiac Enzymes  Recent Labs Lab 09/10/17 2028  TROPONINI 0.04*    Glucose  Recent Labs Lab 09/10/17 1739 09/10/17 2323 09/11/17 0349 09/11/17 0507 09/11/17 0822  GLUCAP 108* 77 66 142* 70    Imaging Ct Head Wo Contrast  Result Date: 09/10/2017 CLINICAL DATA:  Altered level of consciousness.  EXAM: CT HEAD WITHOUT CONTRAST TECHNIQUE: Contiguous axial images were obtained from the base of the skull through the vertex without intravenous contrast. COMPARISON:  03/09/2017 FINDINGS: Brain: No evidence of acute infarction, hemorrhage, hydrocephalus, extra-axial collection or mass lesion/mass effect.Prominence of the sulci and ventricles are identified and appears unchanged when compared with previous exam. Low attenuation within the subcortical and periventricular white matter compatible with chronic small vessel ischemic change. Vascular: No hyperdense vessel or unexpected calcification. Skull: Normal. Negative for fracture or focal lesion. Sinuses/Orbits: No acute finding. Other: None. IMPRESSION: 1. No  acute intracranial abnormalities. 2. Chronic atrophy and small vessel ischemic change. Electronically Signed   By: Signa Kell M.D.   On: 09/10/2017 18:46   Dg Chest Port 1 View  Result Date: 09/10/2017 CLINICAL DATA:  Sepsis.  Missed dialysis. EXAM: PORTABLE CHEST 1 VIEW COMPARISON:  03/09/2017. FINDINGS: Low lung volumes on this AP portable semi erect film. Dialysis catheter tips at the cavoatrial junction. Cardiomegaly. Mild vascular congestion, borderline edema. No pneumothorax. Degenerative change both shoulders. IMPRESSION: Cardiomegaly. Mild vascular congestion, borderline edema. Worsening aeration from priors. Electronically Signed   By: Elsie Stain M.D.   On: 09/10/2017 18:26   Dg Abd Portable 1v  Result Date: 09/10/2017 CLINICAL DATA:  ESRD on HD who is chronically noncompliant. Pt AMS and unable to provide any info; possible abdominal pain EXAM: PORTABLE ABDOMEN - 1 VIEW COMPARISON:  03/13/2017, 06/15/2017 FINDINGS: The gas pattern is nonobstructed. There is gaseous distension of the stomach. The patient has had hemiarthroplasty of the right hip. IMPRESSION: Nonobstructive bowel gas pattern. Electronically Signed   By: Norva Pavlov M.D.   On: 09/10/2017 20:59       CULTURES: Blood growing gnr, likely e coli  ANTIBIOTICS: Vancomycin and Zosyn   DISCUSSION: 83 year old with ESRD on dialysis who missed several dialysis sessions who was admitted with altered mental status. Despite one HD session, she is still acidotic and slightly hyperkalemic with a BUN of 106. She is growing a gnr from the blood.    ASSESSMENT / PLAN:    RENAL A:   ESRD on HD with multiple missed sessions. HD per nephrology. HCO3 drip to control K+ and acidosis in the interim   INFECTIOUS A: gram negative bacteremia, likely e coli   P:   Continues Zosyn. RUQ ultrasound and U/A looking for source. HD access looks good    NEUROLOGIC A:   Lethargy. Likely all metabolic encephalopathy for  sepsis and uremia. Will revisit if she does not respond to HD and antibiotics. Thiamine ordered due to hx of EtOH  Greater than 35 minutes was spent in the care of this patient today   Penny Pia, MD Pulmonary and Critical Care Medicine [facility] Pager: [phone]  09/11/2017, 10:17 AM

## 2017-09-11 NOTE — Progress Notes (Signed)
HD tx initiated via HD cath w/ Lines reversed, AP sluggish pull, push/flush a bit better, VP pull/push/flush well, VSS, will cont to monitor

## 2017-09-11 NOTE — Progress Notes (Signed)
Pt's CBG @ 1140 was 66.  1/2 amp of D50 given.  Checked CBG again 15 min later.  CBG 90.

## 2017-09-11 NOTE — Progress Notes (Signed)
Spoke with Pt's daughter, Clearence Cheek on the phone.  She stated that she could not get a ride to the hospital; she lives in [place].  She stated that she would keep attempting to get a ride.  She wants to be notified if we need her for any changes that occur with her mother.

## 2017-09-11 NOTE — Progress Notes (Signed)
  PHARMACY - PHYSICIAN COMMUNICATION CRITICAL VALUE ALERT - BLOOD CULTURE IDENTIFICATION (BCID)  Results for orders placed or performed during the hospital encounter of 09/10/17  Blood Culture ID Panel (Reflexed) (Collected: 09/10/2017  5:45 PM)  Result Value Ref Range   Enterococcus species NOT DETECTED NOT DETECTED   Listeria monocytogenes NOT DETECTED NOT DETECTED   Staphylococcus species NOT DETECTED NOT DETECTED   Staphylococcus aureus NOT DETECTED NOT DETECTED   Streptococcus species NOT DETECTED NOT DETECTED   Streptococcus agalactiae NOT DETECTED NOT DETECTED   Streptococcus pneumoniae NOT DETECTED NOT DETECTED   Streptococcus pyogenes NOT DETECTED NOT DETECTED   Acinetobacter baumannii NOT DETECTED NOT DETECTED   Enterobacteriaceae species DETECTED (A) NOT DETECTED   Enterobacter cloacae complex NOT DETECTED NOT DETECTED   Escherichia coli DETECTED (A) NOT DETECTED   Klebsiella oxytoca NOT DETECTED NOT DETECTED   Klebsiella pneumoniae NOT DETECTED NOT DETECTED   Proteus species NOT DETECTED NOT DETECTED   Serratia marcescens NOT DETECTED NOT DETECTED   Carbapenem resistance NOT DETECTED NOT DETECTED   Haemophilus influenzae NOT DETECTED NOT DETECTED   Neisseria meningitidis NOT DETECTED NOT DETECTED   Pseudomonas aeruginosa NOT DETECTED NOT DETECTED   Candida albicans NOT DETECTED NOT DETECTED   Candida glabrata NOT DETECTED NOT DETECTED   Candida krusei NOT DETECTED NOT DETECTED   Candida parapsilosis NOT DETECTED NOT DETECTED   Candida tropicalis NOT DETECTED NOT DETECTED    Name of physician (or Provider) Contacted: P. Hoffman  Came in encephalopathic. 2/2 blood cx with GNRs. BCID detects E. Coli. Has had some E. Coli in urine in the past but fairly sensitive. Currenty on vancomycin and Zosyn for sepsis.  Changes to prescribed antibiotics required: Recommended stopping vancomycin and Zosyn and starting ceftriaxone 2g IV Q24h  Monica [name] J 09/11/2017  9:41  AM

## 2017-09-11 NOTE — Progress Notes (Signed)
Elink paged regarding CBG trending down, 50 of dextrose ordered.  will cont to monitor

## 2017-09-11 NOTE — Progress Notes (Signed)
Dialysis treatment terminated after 41 minutes d/t HR sustaining in mid to high 140's.  481 mL ultrafiltrated.  -19 mL net fluid removal.  Patient status unchanged. Lung sounds diminished and coarse to ausculation in all fields. BLE 1+ edema. Cardiac: ST.  Cleansed LIJ catheter with chlorhexidine.  Disconnected lines and flushed ports with saline per protocol.  Ports locked with heparin and capped per protocol.    Report given to bedside, RN Orpha Bur.

## 2017-09-11 NOTE — Progress Notes (Signed)
Nephrology made aware of increasing HR.  HD tx initiated with HR sustaining in low 130's.  30 minutes into tx HR sustaining in mid 140's.  Verbal order received to D/C tx if/when HR sustains over 150.  Will continue to monitor.

## 2017-09-11 NOTE — Progress Notes (Signed)
Received communication from dialysis RN Tory HD initiated per orders at 0015.  HR in 70s, soon after initiating Rx Hr sustained in 120s, appeared to be sinus tach on monitor.  100 mL NS given.  HR continued to climb into the high 130s.    Treatment terminated after 1 hr (1K bath used and 2 hr treatment time had been prescribed).   Stat K sent and is pending.    I have ordered a bicarb gtt as well.  Expect HR to return to pre-HD values (70s) after discontinuation of rx but if not would need investigation.  Orders have already been placed for serial treatments so she will receive treatment later today as well provided VS are improved.    [name] MD [facility] pgr [phone]

## 2017-09-11 NOTE — Progress Notes (Addendum)
CBG @ 1600 was 66.  1/2 amp of D50 Given.  CBG now 72.

## 2017-09-11 NOTE — Progress Notes (Signed)
CBG 32. of D50 given. CBG 78. MD paged.

## 2017-09-11 NOTE — Progress Notes (Signed)
HD tx ended 1 hr early @ 0115 d/t HR sustaining > ordered parameters, stat K+ drew prior to take off and sent to lab, UF goal not met, blood rinsed back, HR still >130, report given to Roddie Mc, RN

## 2017-09-12 ENCOUNTER — Inpatient Hospital Stay (HOSPITAL_COMMUNITY): Payer: Medicare Other

## 2017-09-12 DIAGNOSIS — B9620 Unspecified Escherichia coli [E. coli] as the cause of diseases classified elsewhere: Secondary | ICD-10-CM

## 2017-09-12 DIAGNOSIS — A4151 Sepsis due to Escherichia coli [E. coli]: Principal | ICD-10-CM

## 2017-09-12 DIAGNOSIS — R7881 Bacteremia: Secondary | ICD-10-CM

## 2017-09-12 LAB — CBC WITH DIFFERENTIAL/PLATELET
BASOS ABS: 0 10*3/uL (ref 0.0–0.1)
Basophils Relative: 0 %
EOS ABS: 0 10*3/uL (ref 0.0–0.7)
EOS PCT: 0 %
HCT: 24.2 % — ABNORMAL LOW (ref 36.0–46.0)
Hemoglobin: 8.7 g/dL — ABNORMAL LOW (ref 12.0–15.0)
LYMPHS ABS: 0.6 10*3/uL — AB (ref 0.7–4.0)
Lymphocytes Relative: 3 %
MCH: 28.5 pg (ref 26.0–34.0)
MCHC: 36 g/dL (ref 30.0–36.0)
MCV: 79.3 fL (ref 78.0–100.0)
MONO ABS: 1.5 10*3/uL — AB (ref 0.1–1.0)
MONOS PCT: 7 %
NEUTROS ABS: 19 10*3/uL — AB (ref 1.7–7.7)
Neutrophils Relative %: 90 %
PLATELETS: 173 10*3/uL (ref 150–400)
RBC: 3.05 MIL/uL — ABNORMAL LOW (ref 3.87–5.11)
RDW: 15.9 % — AB (ref 11.5–15.5)
WBC: 21.1 10*3/uL — ABNORMAL HIGH (ref 4.0–10.5)

## 2017-09-12 LAB — BASIC METABOLIC PANEL
ANION GAP: 26 — AB (ref 5–15)
BUN: 96 mg/dL — AB (ref 6–20)
CALCIUM: 7.4 mg/dL — AB (ref 8.9–10.3)
CO2: 14 mmol/L — ABNORMAL LOW (ref 22–32)
Chloride: 97 mmol/L — ABNORMAL LOW (ref 101–111)
Creatinine, Ser: 12.49 mg/dL — ABNORMAL HIGH (ref 0.44–1.00)
GFR calc Af Amer: 3 mL/min — ABNORMAL LOW (ref >60)
GFR, EST NON AFRICAN AMERICAN: 2 mL/min — AB (ref >60)
GLUCOSE: 71 mg/dL (ref 65–99)
POTASSIUM: 4.3 mmol/L (ref 3.5–5.1)
SODIUM: 137 mmol/L (ref 135–145)

## 2017-09-12 LAB — GLUCOSE, CAPILLARY
GLUCOSE-CAPILLARY: 112 mg/dL — AB (ref 65–99)
Glucose-Capillary: 69 mg/dL (ref 65–99)
Glucose-Capillary: 69 mg/dL (ref 65–99)
Glucose-Capillary: 84 mg/dL (ref 65–99)
Glucose-Capillary: 69 mg/dL (ref 65–99)
Glucose-Capillary: 73 mg/dL (ref 65–99)

## 2017-09-12 LAB — HEPATITIS B SURFACE ANTIGEN: Hepatitis B Surface Ag: NEGATIVE

## 2017-09-12 LAB — PROCALCITONIN

## 2017-09-12 LAB — HEPATITIS B SURFACE ANTIBODY,QUALITATIVE: Hep B S Ab: NONREACTIVE

## 2017-09-12 LAB — PHOSPHORUS: PHOSPHORUS: 6.5 mg/dL — AB (ref 2.5–4.6)

## 2017-09-12 MED ORDER — DEXTROSE 50 % IV SOLN
INTRAVENOUS | Status: AC
  Administered 2017-09-12: 25 mL via INTRAVENOUS
  Filled 2017-09-12: qty 50

## 2017-09-12 MED ORDER — DEXTROSE 50 % IV SOLN
25.0000 mL | Freq: Once | INTRAVENOUS | Status: AC
  Administered 2017-09-12: 25 mL via INTRAVENOUS

## 2017-09-12 MED ORDER — METOPROLOL TARTRATE 5 MG/5ML IV SOLN
2.5000 mg | INTRAVENOUS | Status: DC | PRN
  Administered 2017-09-12: 2.5 mg via INTRAVENOUS
  Filled 2017-09-12: qty 5

## 2017-09-12 MED ORDER — LORAZEPAM 2 MG/ML IJ SOLN
0.5000 mg | INTRAMUSCULAR | Status: DC | PRN
  Administered 2017-09-14 – 2017-09-17 (×3): 0.5 mg via INTRAVENOUS
  Filled 2017-09-12 (×3): qty 1

## 2017-09-12 MED ORDER — HYDROMORPHONE HCL 1 MG/ML IJ SOLN
0.5000 mg | INTRAMUSCULAR | Status: DC | PRN
  Administered 2017-09-12 – 2017-09-16 (×3): 0.5 mg via INTRAVENOUS
  Filled 2017-09-12 (×3): qty 1

## 2017-09-12 MED ORDER — GLYCOPYRROLATE 0.2 MG/ML IJ SOLN: 0.4000 mg | Freq: Four times a day (QID) | INTRAMUSCULAR | Status: DC | PRN

## 2017-09-12 MED ORDER — LIDOCAINE HCL (PF) 1 % IJ SOLN
INTRAMUSCULAR | Status: AC
  Filled 2017-09-12: qty 5

## 2017-09-12 MED ORDER — DEXTROSE 5 % IV SOLN
2.0000 g | INTRAVENOUS | Status: DC
  Administered 2017-09-12: 2 g via INTRAVENOUS
  Filled 2017-09-12: qty 2

## 2017-09-12 NOTE — Progress Notes (Signed)
Pt CBG low despite D10 gtt. MD paged. Will increase rate to 75

## 2017-09-12 NOTE — Progress Notes (Signed)
Arrived to patient room 4N-28 at 1000.  Reviewed treatment plan and this RN agrees with plan.  Report received from bedside RN, Marissa.  Consent  verified.  Patient D/) X 4.   Lung sounds diminished to ausculation in all fields. BLE 1+ pitting edema. Cardiac:  ST.  Removed caps and cleansed LIJ catheter with chlorhedxidine.  Aspirated ports of heparin and flushed them with saline per protocol.  Connected and secured lines, initiated treatment at 1015.  UF Goal of 500 mL and net fluid removal 0 L.  Will continue to monitor.

## 2017-09-12 NOTE — Consult Note (Signed)
[facility]    Date of Admission:  09/10/2017           Day 3 vancomycin        Day 3 piperacillin tazobactam       Reason for Consult: Escherichia coli bacteremia    Referring Provider: Dr. Delano Metz  Assessment: She has Escherichia coli bacteremia. It is unclear if she still makes urine. A urine culture one year ago who a fairly antibiotic sensitive Escherichia coli. Her bacteremia could be from a urinary tract infection. Her hemodialysis catheter is also a potential source. Based on our pharmacy antibiotic protocol I will narrow to ceftriaxone pending antibiotic susceptibility results.  Plan: 1. Start ceftriaxone 2 g IV daily pending antibiotic susceptibility results 2. Discontinue vancomycin and piperacillin tazobactam   Principal Problem:   Bacteremia due to Escherichia coli Active Problems:   Encephalopathy   Hyperkalemia   End-stage renal disease on hemodialysis (HCC)   Acidosis, metabolic   Scheduled Meds: . chlorhexidine  15 mL Mouth Rinse BID  . heparin  5,000 Units Subcutaneous Q8H  . lidocaine (PF)      . mouth rinse  15 mL Mouth Rinse q12n4p  . pantoprazole (PROTONIX) IV  40 mg Intravenous Q12H  . thiamine injection  100 mg Intravenous Daily   Continuous Infusions: . sodium chloride    . sodium chloride    . sodium chloride    . dextrose 75 mL/hr at 09/12/17 0600  . piperacillin-tazobactam (ZOSYN)  IV 3.375 g (09/12/17 1136)   PRN Meds:.sodium chloride, sodium chloride, sodium chloride, alteplase  HPI: Monica [name] is a 83 y.o. female who was found unresponsive at home after missing 2 hemodialysis treatments this week. She was acidotic, hyperkalemic, hypothermic and encephalopathic. Both admission blood cultures have grown Escherichia coli. Her nurse indicates that she is more alert today after emergent hemodialysis.   Review of Systems: Review of Systems  Unable to perform ROS: Mental acuity    Past Medical  History:  Diagnosis Date  . Abscess of buttock 12/29/2013  . Acute blood loss anemia 03/13/2017  . Acute delirium   . Anemia of renal disease 03/13/2017  . Anemia, chronic renal failure, stage 5 (HCC) 10/23/2016  . Anxiety   . Arthritis    "all over" (01/29/2017)  . Atrial flutter (HCC)   . Chronic back pain    "all my back" (01/29/2017)  . CKD (chronic kidney disease) stage 5, GFR less than 15 ml/min (HCC) 12/29/2013  . Constipation   . Dementia   . Depression   . Displaced fracture of right femoral neck (HCC)   . Elevated troponin   . Encounter for nasogastric (NG) tube placement   . Escherichia coli urinary tract infection 12/29/2013  . Escherichia coli urinary tract infection 12/29/2013  . ESRD (end stage renal disease) on dialysis [facility])    "MWF; [facility]" (01/29/2017)  . ETOHism (HCC)    Quit in the 1980s.  . Gastric ulcer 12/2013.   Prepyloric ulcer. On follow-up EGD in 02/2014 this was 90% healed  . Gastric ulcer with hemorrhage 12/29/2013  . GERD (gastroesophageal reflux disease)   . Headache    as a young woman  . Hemorrhoids   . History of alcohol dependence (HCC) 03/23/2017  . History of gastric ulcer 03/23/2017  . History of kidney stones   . History of stomach ulcers   . Hypertension   . Left sided chest  pain 10/28/2016   See 10/28/16 she describes pleuritic-type pain on the left since a fall approximately 3 weeks ago Apparently she slipped in her tub without cardiac or neurologic prodrome  . Melena 12/29/2013  . Pancreatic cyst 09/2010.   At uncinate process.  Serial CT imaging favors benign process  . Protein-calorie malnutrition, severe 10/25/2016  . SBO (small bowel obstruction) (HCC)   . Unintentional weight loss 10/23/2016  . Upper GI bleed 12/28/2013    Social History  Substance Use Topics  . Smoking status: Former Smoker    Packs/day: 0.50    Years: 32.00    Types: Cigarettes    Quit date: 02/14/1986  . Smokeless tobacco: Never Used  . Alcohol use Yes      Comment: 01/29/2017 "used to have a problem w/it; nothing since 1987"    Family History  Problem Relation Age of Onset  . Alcohol abuse Mother    Allergies  Allergen Reactions  . Aspirin Other (See Comments)    "makes my stomach hurt"  . Codeine Other (See Comments)    "i get irritated" Patient tolerates Tramadol without difficulty.     OBJECTIVE: Blood pressure (!) 116/55, pulse (!) 148, temperature 99 F (37.2 C), resp. rate (!) 22, height  (1.651 m), weight 137 lb 12.6 oz (62.5 kg), SpO2 95 %.  Physical Exam  Constitutional:  She opens her eyes to voice but does not respond to questions.  Cardiovascular: Regular rhythm.   Murmur heard. She is tachycardic with a heart rate in the 160s. There is a 1/6 systolic murmur.  Pulmonary/Chest: Breath sounds normal. She has no wheezes. She has no rales.  Slightly labored breathing. Left subclavian hemodialysis catheter site looks okay.  Abdominal: Soft. She exhibits no mass. There is no tenderness.  Musculoskeletal: Normal range of motion. She exhibits no edema or tenderness.  Skin: No rash noted.    Lab Results Lab Results  Component Value Date   WBC 21.1 (H) 09/12/2017   HGB 8.7 (L) 09/12/2017   HCT 24.2 (L) 09/12/2017   MCV 79.3 09/12/2017   PLT 173 09/12/2017    Lab Results  Component Value Date   CREATININE 12.49 (H) 09/12/2017   BUN 96 (H) 09/12/2017   NA 137 09/12/2017   K 4.3 09/12/2017   CL 97 (L) 09/12/2017   CO2 14 (L) 09/12/2017    Lab Results  Component Value Date   ALT 16 09/11/2017   AST 33 09/11/2017   ALKPHOS 63 09/11/2017   BILITOT 1.2 09/11/2017     Microbiology: Recent Results (from the past 240 hour(s))  Blood Culture (routine x 2)     Status: Abnormal (Preliminary result)   Collection Time: 09/10/17  5:45 PM  Result Value Ref Range Status   Specimen Description BLOOD RIGHT ANTECUBITAL  Final   Special Requests   Final    BOTTLES DRAWN AEROBIC AND ANAEROBIC Blood Culture adequate  volume   Culture  Setup Time   Final    GRAM NEGATIVE RODS IN BOTH AEROBIC AND ANAEROBIC BOTTLES CRITICAL RESULT CALLED TO, READ BACK BY AND VERIFIED WITH: PHARMD N BATCHELDER [phone] MLM    Culture ESCHERICHIA COLI SUSCEPTIBILITIES TO FOLLOW  (A)  Final   Report Status PENDING  Incomplete  Blood Culture ID Panel (Reflexed)     Status: Abnormal   Collection Time: 09/10/17  5:45 PM  Result Value Ref Range Status   Enterococcus species NOT DETECTED NOT DETECTED Final   Listeria monocytogenes  NOT DETECTED NOT DETECTED Final   Staphylococcus species NOT DETECTED NOT DETECTED Final   Staphylococcus aureus NOT DETECTED NOT DETECTED Final   Streptococcus species NOT DETECTED NOT DETECTED Final   Streptococcus agalactiae NOT DETECTED NOT DETECTED Final   Streptococcus pneumoniae NOT DETECTED NOT DETECTED Final   Streptococcus pyogenes NOT DETECTED NOT DETECTED Final   Acinetobacter baumannii NOT DETECTED NOT DETECTED Final   Enterobacteriaceae species DETECTED (A) NOT DETECTED Final    Comment: Enterobacteriaceae represent a large family of gram-negative bacteria, not a single organism. CRITICAL RESULT CALLED TO, READ BACK BY AND VERIFIED WITH: PHARMD N BATCHELDER [phone] MLM    Enterobacter cloacae complex NOT DETECTED NOT DETECTED Final   Escherichia coli DETECTED (A) NOT DETECTED Final    Comment: CRITICAL RESULT CALLED TO, READ BACK BY AND VERIFIED WITH: PHARMD N BATCHELDER [phone] MLM    Klebsiella oxytoca NOT DETECTED NOT DETECTED Final   Klebsiella pneumoniae NOT DETECTED NOT DETECTED Final   Proteus species NOT DETECTED NOT DETECTED Final   Serratia marcescens NOT DETECTED NOT DETECTED Final   Carbapenem resistance NOT DETECTED NOT DETECTED Final   Haemophilus influenzae NOT DETECTED NOT DETECTED Final   Neisseria meningitidis NOT DETECTED NOT DETECTED Final   Pseudomonas aeruginosa NOT DETECTED NOT DETECTED Final   Candida albicans NOT DETECTED NOT DETECTED Final     Candida glabrata NOT DETECTED NOT DETECTED Final   Candida krusei NOT DETECTED NOT DETECTED Final   Candida parapsilosis NOT DETECTED NOT DETECTED Final   Candida tropicalis NOT DETECTED NOT DETECTED Final  Blood Culture (routine x 2)     Status: None (Preliminary result)   Collection Time: 09/10/17  5:48 PM  Result Value Ref Range Status   Specimen Description BLOOD RIGHT ARM  Final   Special Requests   Final    BOTTLES DRAWN AEROBIC AND ANAEROBIC Blood Culture adequate volume   Culture  Setup Time   Final    GRAM NEGATIVE RODS IN BOTH AEROBIC AND ANAEROBIC BOTTLES CRITICAL VALUE NOTED.  VALUE IS CONSISTENT WITH PREVIOUSLY REPORTED AND CALLED VALUE.    Culture GRAM NEGATIVE RODS IDENTIFICATION TO FOLLOW   Final   Report Status PENDING  Incomplete  MRSA PCR Screening     Status: None   Collection Time: 09/10/17  9:25 PM  Result Value Ref Range Status   MRSA by PCR NEGATIVE NEGATIVE Final    Comment:        The GeneXpert MRSA Assay (FDA approved for NASAL specimens only), is one component of a comprehensive MRSA colonization surveillance program. It is not intended to diagnose MRSA infection nor to guide or monitor treatment for MRSA infections.   C difficile quick scan w PCR reflex     Status: None   Collection Time: 09/11/17 11:20 AM  Result Value Ref Range Status   C Diff antigen NEGATIVE NEGATIVE Final   C Diff toxin NEGATIVE NEGATIVE Final   C Diff interpretation No C. difficile detected.  Final    Cliffton Asters, MD [facility] [phone] pager   [phone] cell 09/12/2017, 11:44 AM

## 2017-09-12 NOTE — Progress Notes (Signed)
Pt tx to 6n. Report called to [facility]. Assessment stable at this time

## 2017-09-12 NOTE — Progress Notes (Signed)
Dialysis treatment completed.  145 mL ultrafiltrated.  -355 mL net fluid removal.  Patient status unchanged. Lung sounds diminished to ausculation in all fields. BLE 1+ edema. Cardiac: ST.  Cleansed LIJ catheter with chlorhexidine.  Disconnected lines and flushed ports with saline per protocol.  Ports locked with normal saline and capped per protocol.    Report given to bedside, RN Marissa.  Tx terminated after 45 minutes d/t elevated HR sustaining in 160's.  Catheter locked with saline, to be pulled per vascular post tx.  Will continue to monitor.

## 2017-09-12 NOTE — Progress Notes (Signed)
Pt transfer from 4 north report given by [facility], comfort care with purewick, rectal pouch, no access dialysis cath, with right AC IV line with D10W at 10cc/hr, no s/s of distress noted.

## 2017-09-12 NOTE — Progress Notes (Signed)
CBG 48. of D50 given. New CBG 75.

## 2017-09-12 NOTE — Progress Notes (Signed)
Patient ID: Monica [name], female   DOB: 04-05-1932, 83 y.o.   MRN: [id number] Left internal jugular tunneled catheter removed in its entirety under sterile conditions at the bedside. Tip sent for culture and sensitivity.

## 2017-09-12 NOTE — Progress Notes (Signed)
CBG 69. D50 given. New CBG 112.

## 2017-09-12 NOTE — Progress Notes (Signed)
[facility] Progress Note  Subjective:  Pt awake and grunts responses but not talking  Vitals:   09/12/17 0530 09/12/17 0600 09/12/17 0700 09/12/17 0800  BP:  107/61 128/69 114/74  Pulse: (!) 127 (!) 121 (!) 129 (!) 121  Resp: (!) 29 (!) 22 (!) 23 18  Temp:    98.8 F (37.1 C)  TempSrc:    Axillary  SpO2: 92% 97% 93% 100%  Weight:      Height:        Inpatient medications: . chlorhexidine  15 mL Mouth Rinse BID  . heparin  5,000 Units Subcutaneous Q8H  . mouth rinse  15 mL Mouth Rinse q12n4p  . pantoprazole (PROTONIX) IV  40 mg Intravenous Q12H  . thiamine injection  100 mg Intravenous Daily   . sodium chloride    . sodium chloride    . sodium chloride    . dextrose 75 mL/hr at 09/12/17 0600  . piperacillin-tazobactam (ZOSYN)  IV Stopped (09/12/17 0216)   sodium chloride, sodium chloride, sodium chloride, alteplase, heparin, lidocaine (PF), lidocaine-prilocaine, pentafluoroprop-tetrafluoroeth  Exam: Lethargic, lying in bed , eyes half-open +JVD Chest clear bilat RRR, 2/6 sem Abd soft ntnd +bs Ext question 1+ LE edema vs fat tissue Confused and moving ext x 4  Dialysis: [facility]   4h   2/2  56.5kg   L IJ TDC   Hep 1800 -sensipar 120 tiw -venofer 50/wk  -mircera 150 every 2 wks -calcitriol 2.25 tiw      Impression: 1. Altered mental status- due to sepsis +/- uremia.  Missed HD 3-4 times I  2. EColi bacteremia - not really improving with IV abx.  Will have TDC removed today after HD.  3. Tachycardia - septic, prob cause 4. Hyperkalemia - better 5. Volume - up 4kg, +IS edema on CXR on admit. Will repeat.  6. Anemia of CKD - esa when needed 7. MBD - cont meds  Plan - short HD today then will ask VVS to remove catheter.    Vinson Moselle MD [facility] pager [phone]   09/12/2017, 9:22 AM    Recent Labs Lab 09/10/17 2344  09/11/17 0345 09/11/17 1015 09/12/17 0253  NA 137  --  138 139 137  K >7.5*  < > 5.2* 4.7 4.3   CL 104  --  101 103 97*  CO2 <7*  --  10* 10* 14*  GLUCOSE 78  --  73 75 71  BUN 138*  --  106* 113* 96*  CREATININE 18.38*  --  14.34* 15.21* 12.49*  CALCIUM 8.4*  --  8.1* 7.5* 7.4*  PHOS 11.7*  --  8.4*  --  6.5*  < > = values in this interval not displayed.  Recent Labs Lab 09/10/17 1743 09/11/17 1015  AST 19 33  ALT 10* 16  ALKPHOS 94 63  BILITOT 1.4* 1.2  PROT 7.4 6.2*  ALBUMIN 3.7 2.9*    Recent Labs Lab 09/10/17 1743  09/10/17 2344 09/11/17 1354 09/12/17 0253  WBC 13.0*  < > 4.8 13.4* 21.1*  NEUTROABS 11.3*  --   --   --  19.0*  HGB 9.4*  < > 10.2* 9.8* 8.7*  HCT 29.3*  < > 30.8* 28.0* 24.2*  MCV 88.5  < > 86.5 81.2 79.3  PLT 227  < > 247 198 173  < > = values in this interval not displayed. Iron/TIBC/Ferritin/ %Sat    Component Value Date/Time   IRON 29 03/16/2017 1625  TIBC 195 (L) 03/16/2017 1625   FERRITIN 387 (H) 03/16/2017 1625   IRONPCTSAT 15 03/16/2017 1625

## 2017-09-12 NOTE — Progress Notes (Signed)
PULMONARY / CRITICAL CARE MEDICINE   Name: Monica [name] MRN: [id number] DOB: 07-12-1932    ADMISSION DATE:  09/10/2017  CHIEF COMPLAINT: Altered mental status  HISTORY OF PRESENT ILLNESS:   83 yo female former smoker found unresponsive at home.  She has hx of ESRD and had missed dialysis.  Found to have E coli bacteremia with sepsis. PMHx of A flutter, ETOH, Gastric ulcer, Depression, Dementia  SUBJECTIVE:  Tachycardic.  VITAL SIGNS: BP (!) 116/55   Pulse (!) 148   Temp 99 F (37.2 C)   Resp (!) 22   Ht  (1.651 m)   Wt 137 lb 12.6 oz (62.5 kg)   SpO2 95%   BMI 22.93 kg/m   VENTILATOR SETTINGS: FiO2 (%):  [2 %] 2 %  INTAKE / OUTPUT: I/O last 3 completed shifts: In: 1844.2 [I.V.:1494.2; IV Piggyback:350] Out: -475   PHYSICAL EXAMINATION:  General - somnolent Eyes - pupils reactive ENT - mumbles few sounds Cardiac - irregular, tachycardic, no murmur Chest - no wheeze, rales Abd - soft, non tender Ext - no edema, decreased muscle bulk Skin - no rashes Neuro - not following commands  LABS:  BMET  Recent Labs Lab 09/11/17 0345 09/11/17 1015 09/12/17 0253  NA 138 139 137  K 5.2* 4.7 4.3  CL 101 103 97*  CO2 10* 10* 14*  BUN 106* 113* 96*  CREATININE 14.34* 15.21* 12.49*  GLUCOSE 73 75 71    Electrolytes  Recent Labs Lab 09/10/17 2344 09/11/17 0345 09/11/17 1015 09/12/17 0253  CALCIUM 8.4* 8.1* 7.5* 7.4*  MG 2.3 1.9  --   --   PHOS 11.7* 8.4*  --  6.5*    CBC  Recent Labs Lab 09/10/17 2344 09/11/17 1354 09/12/17 0253  WBC 4.8 13.4* 21.1*  HGB 10.2* 9.8* 8.7*  HCT 30.8* 28.0* 24.2*  PLT 247 198 173    Coag's No results for input(s): APTT, INR in the last 168 hours.  Sepsis Markers  Recent Labs Lab 09/10/17 1803 09/10/17 2028 09/10/17 2029 09/10/17 2344 09/12/17 0253  LATICACIDVEN 2.39*  --  2.6* 2.1*  --   PROCALCITON  --  51.56  --  80.51 >150.00    ABG No results for input(s): PHART, PCO2ART, PO2ART in the last  168 hours.  Liver Enzymes  Recent Labs Lab 09/10/17 1743 09/11/17 1015  AST 19 33  ALT 10* 16  ALKPHOS 94 63  BILITOT 1.4* 1.2  ALBUMIN 3.7 2.9*    Cardiac Enzymes  Recent Labs Lab 09/10/17 2028  TROPONINI 0.04*    Glucose  Recent Labs Lab 09/11/17 2346 09/12/17 0422 09/12/17 0448 09/12/17 0803 09/12/17 0916 09/12/17 1147  GLUCAP 75 69 112* 69 84 69    Imaging Dg Chest Port 1 View  Result Date: 09/12/2017 CLINICAL DATA:  Follow-up, sepsis; patient on dialysis. Hx of atrial flutter, hypertension, av fistula placement. Former [phone]). EXAM: PORTABLE CHEST 1 VIEW COMPARISON:  Chest x-rays dated 09/10/2017 and 03/09/2017. FINDINGS: Study is hypoinspiratory with crowding of the perihilar and bibasilar bronchovascular markings. Given the low lung volumes, lungs appear clear with no evidence of pneumonia or pulmonary edema. No pleural effusion or pneumothorax seen. Heart size and mediastinal contours are within normal limits. Atherosclerotic changes noted at the aortic arch. Dialysis catheter appears stable in position. No acute or suspicious osseous finding. IMPRESSION: Low lung volumes. No active disease. No evidence of pneumonia or pulmonary edema. Electronically Signed   By: Bary Richard M.D.   On:  09/12/2017 11:16     CULTURES: Blood 10/04 >> E coli C diff 10/05 >> negative Blood 10/05 >>   ANTIBIOTICS: Vancomycin 10/04 >> 10/06 Zosyn 10/04 >> 10/06  Rocephin 10/06 >>   EVENTS: 10/04 Admit 10/06 ID consulted, VVS consulted and HD catheter removed    ASSESSMENT / PLAN:  Acute metabolic encephalopathy 2nd to sepsis and uremia. - monitor mental status  Sepsis with E coli bacteremia. - possible sources are urine and HD catheter - Abx changed per ID  ESRD with uremia. - HD per renal - VVS to replace catheter  A fib with RVR. - prn lopressor for HR > 120  Severe protein calorie malnutrition. - advance diet when mental status improves  Anemia  of critical illness and chronic disease. - f/u CBC  Hypoglycemia 2nd to sepsis. - continue D10 IV fluid  DVT prophylaxis - SQ heparin SUP - protonix Nutrition - NPO Goals of care - full code.  Palliative care consulted.  CC time 31 minutes  Coralyn Helling, MD [facility] Pulmonary/Critical Care 09/12/2017, 1:48 PM Pager:  [phone] After 3pm call: [phone]

## 2017-09-12 NOTE — Consult Note (Addendum)
Consultation Note Date: 09/12/2017   Patient Name: Monica [name]  DOB: [birth year]  MRN: [id number]  Age / Sex: 83 y.o., female  PCP: Bufford Lope, DO Referring Physician: Raylene Miyamoto, MD  Reason for Consultation: Establishing goals of care and Psychosocial/spiritual support  HPI/Patient Profile: 83 y.o. female  with past medical history of Anemia, dementia, end-stage renal disease on hemodialysis (patient has been missing dialysis appointment since beginning dialysis in September 2018,) arthritis, atrial flutter, A. fib, hip fracture, Escherichia coli urinary tract infection, hypertension, protein calorie malnutrition, small bowel obstruction, upper GI bleed, admitted on 09/10/2017 with encephalopathy. Upon arrival to the emergency room her potassium was 7.5 CO2 less than 7, lactic acid 2.39 and temperature 90.4 Fahrenheit. She also has positive blood cultures, Escherichia coli bacteremia. We have attempted to dialyze her beginning on 09/10/2017 the patient becomes so tachycardic that dialysis has had to be terminated before she could complete a full treatment. Additionally she has  been hypoglycemic  despite being on D10  Since progressing to stage V renal disease and starting hemodialysis, patient has been missing dialysis appointments. Per chart review, as well as discussion with daughter, patient has dementia, lives alone, and difficulty getting to dialysis as well as intolerance secondary to cramps  Clinical Assessment and Goals of Care: Patient at this point is unable to speak for herself. She is encephalopathic, nonverbal. I did meet with one of her 3 children, daughter, Monica [name]. Patient has 3 children and no designated healthcare power of attorney  Monica [name] [phone] dtr Monica [name] [phone] son Monica [name] [phone] Per state of [facility], healthcare decision making  would fall to the majority of her children to agree on future healthcare decision making specifically CODE STATUS, continuation of dialysis, moving towards comfort care  Per my conversation with patient's daughter, Monica [name], her mother has very poor quality of life, decreasing functional status and has shared with her that she never wants to return to a nursing home. Patient has dementia and has found getting to dialysis, maintaining her home, healthcare appointments to be overwhelming. I did update patient's daughter extensively as to her medical condition. At one point patient's daughter asked me to speak to her son and daughter-in-law who live in [place]. She reports at one time her mother was going to make her grandson, healthcare power of attorney. Her grandson states Monica [name]) that did not materialize. He did ask that I speak to his wife who is an ICU nurse and I did go through her clinical data as well as my impression that patient is nearing end-of-life.   I did presented 3 avenues of clinical decision making: 1. Full scope of treatment which may involve life support CPR defibrillation and transfer to ICU. I did candidly share that I did not feel that this in the setting of dementia and already documented inability to tolerate hemodialysis, that this would not improve her quality of life and would also necessitate her living in a nursing home. 2. Continue to focus on  doing everything we can do for her but set a  limit of DO NOT RESUSCITATE and see how she continues to do. 3. Comfort care with transfer to residential hospice in [place] discussion with patient's daughter, Monica [name], her decision for her mother based on what she has observed as her poor quality of life, inability to tolerate hemodialysis, she would pursue DO NOT RESUSCITATE, and transfer to residential hospice in [facility] and stopping hemodialysis; "my mother is suffering, she is very tired"  Addendum 1600: Spoke to pt's son  Monica [name] who resides in [place]. He too is in agreement with comfort care. Explained code status and he confirmed desire for DNR, stopping HD. Prepared him for a prognosis of less than 2 weeks. We did talk about residential hospice but for now, would like to see her remain in the hospital secondary to concerns for pt's other son, Monica [name]. I called pt's daughter, Monica [name] again, and updated her as to my conversation with Monica [name]. She reiterated her desire for DNR and stopping HD. Both Monica [name] and Monica [name] state pt's other son, Monica [name], struggles with alcoholism and they are attempting to reach him. Both Monica [name] and Monica [name] state their mother have verbalized how tired she is and that "she would not want to live this way"    SUMMARY OF RECOMMENDATIONS   DNR/DNI Stop HD Comfort care Code Status/Advance Care Planning:  DNR    Symptom Management:   Pain/dyspnea: No nonverbal signs and symptoms of pain presently; continue with Tylenol and will add low dose dilaudid PRN ( MS04 is not well metabolized in the setting of renal failure ). Monitor for need for continuous infusions  Nausea: Continue with Zofran as needed. Monitor for need for scheduled dosing  Anxiety: Will add ativan PRN and monitor for need for scheduled dosing  Secretions: Will add PRN robinul  Palliative Prophylaxis:   Aspiration, Bowel Regimen, Delirium Protocol, Eye Care, Frequent Pain Assessment, Oral Care and Turn Reposition  Additional Recommendations (Limitations, Scope, Preferences): Comfort care Psycho-social/Spiritual:   Desire for further Chaplaincy support:no  Additional Recommendations: Grief/Bereavement Support  Prognosis:   < 2 weeks  in the setting of inability to tolerate hemodialysis ( family in agreement to stop HD), end-stage renal disease, bacteremia, cardiac fragility, dementia  Discharge Planning: Hospital death vs. Residential hospice      Primary Diagnoses: Present on Admission: .  Hyperkalemia . Acidosis, metabolic . Encephalopathy . Bacteremia due to Escherichia coli   I have reviewed the medical record, interviewed the patient and family, and examined the patient. The following aspects are pertinent.  Past Medical History:  Diagnosis Date  . Abscess of buttock 12/29/2013  . Acute blood loss anemia 03/13/2017  . Acute delirium   . Anemia of renal disease 03/13/2017  . Anemia, chronic renal failure, stage 5 (HCC) 10/23/2016  . Anxiety   . Arthritis    "all over" (01/29/2017)  . Atrial flutter ([place])   . Chronic back pain    "all my back" (01/29/2017)  . CKD (chronic kidney disease) stage 5, GFR less than 15 ml/min (HCC) 12/29/2013  . Constipation   . Dementia   . Depression   . Displaced fracture of right femoral neck ([place])   . Elevated troponin   . Encounter for nasogastric (NG) tube placement   . Escherichia coli urinary tract infection 12/29/2013  . Escherichia coli urinary tract infection 12/29/2013  . ESRD (end stage renal disease) on dialysis [facility])    "MWF; Jeneen Rinks" (01/29/2017)  .  ETOHism ([place])    Quit in the 1980s.  . Gastric ulcer 12/2013.   Prepyloric ulcer. On follow-up EGD in 02/2014 this was 90% healed  . Gastric ulcer with hemorrhage 12/29/2013  . GERD (gastroesophageal reflux disease)   . Headache    as a young woman  . Hemorrhoids   . History of alcohol dependence ([place]) 03/23/2017  . History of gastric ulcer 03/23/2017  . History of kidney stones   . History of stomach ulcers   . Hypertension   . Left sided chest pain 10/28/2016   See 10/28/16 she describes pleuritic-type pain on the left since a fall approximately 3 weeks ago Apparently she slipped in her tub without cardiac or neurologic prodrome  . Melena 12/29/2013  . Pancreatic cyst 09/2010.   At uncinate process.  Serial CT imaging favors benign process  . Protein-calorie malnutrition, severe 10/25/2016  . SBO (small bowel obstruction) ([place])   . Unintentional weight loss 10/23/2016   . Upper GI bleed 12/28/2013   Social History   Social History  . Marital status: Widowed    Spouse name: N/A  . Number of children: 3  . Years of education: 7   Social History Main Topics  . Smoking status: Former Smoker    Packs/day: 0.50    Years: 32.00    Types: Cigarettes    Quit date: 02/14/1986  . Smokeless tobacco: Never Used  . Alcohol use Yes     Comment: 01/29/2017 "used to have a problem w/it; nothing since 1987"  . Drug use: No  . Sexual activity: Not Currently   Other Topics Concern  . None   Social History Narrative   Lives alone   5 children (2 sons deceased, 2 sons living, one dgt living).  She is estranged from her children   Patient's neighbor provides non-finanacial support to the patient for a long time.    Family History  Problem Relation Age of Onset  . Alcohol abuse Mother    Scheduled Meds: . chlorhexidine  15 mL Mouth Rinse BID  . heparin  5,000 Units Subcutaneous Q8H  . mouth rinse  15 mL Mouth Rinse q12n4p  . pantoprazole (PROTONIX) IV  40 mg Intravenous Q12H  . thiamine injection  100 mg Intravenous Daily   Continuous Infusions: . sodium chloride    . sodium chloride    . cefTRIAXone (ROCEPHIN)  IV Stopped (09/12/17 1424)  . dextrose 75 mL/hr at 09/12/17 0600   PRN Meds:.sodium chloride, sodium chloride, alteplase, metoprolol tartrate Medications Prior to Admission:  Prior to Admission medications   Medication Sig Start Date End Date Taking? Authorizing Provider  ALPRAZolam Duanne Moron) 0.5 MG tablet Take 0.5 mg by mouth 2 (two) times daily. 08/18/17  Yes [provider]  atorvastatin (LIPITOR) 40 MG tablet Take 1 tablet (40 mg total) by mouth daily. 06/18/17  Yes Orson Eva J, DO  metoprolol tartrate (LOPRESSOR) 25 MG tablet Take 1 tablet (25 mg total) by mouth 2 (two) times daily. 05/27/17  Yes Orson Eva J, DO  pantoprazole (PROTONIX) 40 MG tablet Take 1 tablet (40 mg total) by mouth daily. 06/26/17  Yes Bufford Lope, DO  aspirin 81  MG EC tablet Take 1 tablet (81 mg total) by mouth daily. 06/26/17   Bufford Lope, DO  calcitRIOL (ROCALTROL) 0.25 MCG capsule Take 3 capsules (0.75 mcg total) by mouth every Monday, Wednesday, and Friday with hemodialysis. 04/15/17   Virginia Crews, MD  nitroGLYCERIN (NITROSTAT) 0.4 MG SL  tablet Place 1 tablet (0.4 mg total) under the tongue every 5 (five) minutes as needed for chest pain. 04/15/17   Bacigalupo, Dionne Bucy, MD   Allergies  Allergen Reactions  . Aspirin Other (See Comments)    "makes my stomach hurt"  . Codeine Other (See Comments)    "i get irritated" Patient tolerates Tramadol without difficulty.    Review of Systems  Unable to perform ROS: Acuity of condition    Physical Exam  Constitutional: She appears well-developed and well-nourished.  Acutely ill appearing elderly female; she will open her eyes to voice, nonverbal  Cardiovascular:  Tachycardic  Pulmonary/Chest: Effort normal.  Neurological:  Minimally responsive. Will open her eyes to voice; unable to follow commands; nonverbal  Skin: Skin is warm and dry.  Psychiatric:  Unable to test  Nursing note and vitals reviewed.   Vital Signs: BP 119/76   Pulse (!) 131   Temp 99 F (37.2 C)   Resp (!) 21   Ht '5\' 5"'$  (1.651 m)   Wt 62.5 kg (137 lb 12.6 oz)   SpO2 96%   BMI 22.93 kg/m  Pain Assessment: PAINAD       SpO2: SpO2: 96 % O2 Device:SpO2: 96 % O2 Flow Rate: .O2 Flow Rate (L/min): 2 L/min  IO: Intake/output summary:  Intake/Output Summary (Last 24 hours) at 09/12/17 1436 Last data filed at 09/12/17 1400  Gross per 24 hour  Intake          1297.92 ml  Output             -374 ml  Net          1671.92 ml    LBM: Last BM Date: 09/12/17 Baseline Weight: Weight: 56.7 kg (125 lb) Most recent weight: Weight: 62.5 kg (137 lb 12.6 oz)     Palliative Assessment/Data:   Flowsheet Rows     Most Recent Value  Intake Tab  Referral Department  Critical care  Unit at Time of Referral  ICU   Palliative Care Primary Diagnosis  Sepsis/Infectious Disease  Date Notified  09/10/17  Palliative Care Type  New Palliative care  Reason for referral  Clarify Goals of Care  Date of Admission  09/10/17  Date first seen by Palliative Care  09/12/17  # of days Palliative referral response time  2 Day(s)  # of days IP prior to Palliative referral  0  Clinical Assessment  Palliative Performance Scale Score  30%  Pain Max last 24 hours  Not able to report  Pain Min Last 24 hours  Not able to report  Dyspnea Max Last 24 Hours  Not able to report  Dyspnea Min Last 24 hours  Not able to report  Nausea Max Last 24 Hours  Not able to report  Nausea Min Last 24 Hours  Not able to report  Anxiety Max Last 24 Hours  Not able to report  Anxiety Min Last 24 Hours  Not able to report  Other Max Last 24 Hours  Not able to report  Psychosocial & Spiritual Assessment  Palliative Care Outcomes  Patient/Family meeting held?  Yes  Who was at the meeting?  met with dtr [place]  Provided psychosocial or spiritual support, Counseled regarding hospice  Palliative Care follow-up planned  Yes, Facility      Time In: 1330 Time Out: 7681 Time Total: 75 min Greater than 50%  of this time was spent counseling and coordinating care related to  the above assessment and plan.Staffed with bedside RN as well as CCM provider, Dr. Halford Chessman, Dr. Rowe Pavy  Signed by: Dory Horn, NP   Please contact Palliative Medicine Team phone at [phone] for questions and concerns.  For individual provider: See Shea Evans

## 2017-09-12 NOTE — Consult Note (Signed)
   Patient name: Monica [name] MRN: [id number] DOB: 1932-05-09 Sex: female    HPI: Monica [name] is a 83 y.o. female admitted with possible sepsis. Felt that left IJ catheters most likely source and we've been asked to remove this. From reviewing her old records it looks like this may been present since December.  Current Facility-Administered Medications  Medication Dose Route Frequency Provider Last Rate Last Dose  . 0.9 %  sodium chloride infusion  100 mL Intravenous PRN [name], MD      . 0.9 %  sodium chloride infusion  100 mL Intravenous PRN [name], MD      . 0.9 %  sodium chloride infusion  250 mL Intravenous PRN Tobey Grim, NP      . alteplase (CATHFLO ACTIVASE) injection 2 mg  2 mg Intracatheter Once PRN [name], MD      . chlorhexidine (PERIDEX) 0.12 % solution 15 mL  15 mL Mouth Rinse BID Nelda Bucks, MD   15 mL at 09/12/17 0937  . dextrose 10 % infusion   Intravenous Continuous Alyson Reedy, MD 75 mL/hr at 09/12/17 0600    . heparin injection 5,000 Units  5,000 Units Subcutaneous Q8H Tobey Grim, NP   5,000 Units at 09/11/17 0436  . MEDLINE mouth rinse  15 mL Mouth Rinse q12n4p Nelda Bucks, MD      . pantoprazole (PROTONIX) injection 40 mg  40 mg Intravenous Q12H Lynnell Jude, MD   40 mg at 09/11/17 2216  . piperacillin-tazobactam (ZOSYN) IVPB 3.375 g  3.375 g Intravenous Q12H Almon Hercules, [place]   Stopped at 09/12/17 0216  . thiamine (B-1) injection 100 mg  100 mg Intravenous Daily Lynnell Jude, MD   100 mg at 09/11/17 1345     PHYSICAL EXAM: Vitals:   09/12/17 0530 09/12/17 0600 09/12/17 0700 09/12/17 0800  BP:  107/61 128/69 114/74  Pulse: (!) 127 (!) 121 (!) 129 (!) 121  Resp: (!) 29 (!) 22 (!) 23 18  Temp:    98.8 F (37.1 C)  TempSrc:    Axillary  SpO2: 92% 97% 93% 100%  Weight:      Height:        GENERAL: The patient is a well-nourished female, in no acute  distress. The vital signs are documented above. No fluctuance or erythema around catheter to suggest obvious infection.  MEDICAL ISSUES: Sepsis with presumed catheter-based infection. For urgent hemodialysis now and will remove her catheter once this is completed.   Larina Earthly, MD FACS [facility] Tel [phone] Pager [phone]

## 2017-09-13 DIAGNOSIS — R4182 Altered mental status, unspecified: Secondary | ICD-10-CM

## 2017-09-13 DIAGNOSIS — Z515 Encounter for palliative care: Secondary | ICD-10-CM

## 2017-09-13 LAB — CULTURE, BLOOD (ROUTINE X 2)
SPECIAL REQUESTS: ADEQUATE
Special Requests: ADEQUATE

## 2017-09-13 MED ORDER — HYDROMORPHONE HCL 1 MG/ML IJ SOLN
0.2500 mg | Freq: Four times a day (QID) | INTRAMUSCULAR | Status: DC
  Administered 2017-09-13 – 2017-09-14 (×5): 0.25 mg via INTRAVENOUS
  Filled 2017-09-13 (×5): qty 1

## 2017-09-13 MED ORDER — ACETAMINOPHEN 325 MG PO TABS: 650.0000 mg | ORAL_TABLET | Freq: Four times a day (QID) | ORAL | Status: DC | PRN

## 2017-09-13 MED ORDER — ACETAMINOPHEN 650 MG RE SUPP: 650.0000 mg | RECTAL | Status: DC | PRN

## 2017-09-13 NOTE — Progress Notes (Signed)
Daily Progress Note   Patient Name: Monica [name]       Date: 09/13/2017 DOB: Aug 10, 1932  Age: 83 y.o. MRN#: [id number] Attending Physician: Raylene Miyamoto, MD Primary Care Physician: Bufford Lope, DO Admit Date: 09/10/2017  Reason for Consultation/Follow-up: Non pain symptom management, Pain control and Psychosocial/spiritual support  Subjective: Patient seen this morning. She is more alert than when seen on 09/12/2017. She is complaining of cramps in her stomach. She appears short of breath at rest and acknowledges that she does feel short of breath.  I did talk to her about stopping hemodialysis. She indicated she would be willing to keep trying, but I did share with her that her heart would no longer let her do that. At that point she stated "okay, I'm ready to go any time; I'm real tired"  Length of Stay: 3  Current Medications: Scheduled Meds:  .  HYDROmorphone (DILAUDID) injection  0.25 mg Intravenous Q6H  . mouth rinse  15 mL Mouth Rinse q12n4p  . pantoprazole (PROTONIX) IV  40 mg Intravenous Q12H  . thiamine injection  100 mg Intravenous Daily    Continuous Infusions: . dextrose 10 mL/hr at 09/12/17 1823    PRN Meds: glycopyrrolate, HYDROmorphone (DILAUDID) injection, LORazepam, metoprolol tartrate  Physical Exam  Constitutional: She appears well-developed.  Elderly female, alert, increased work of breathing  HENT:  Head: Normocephalic and atraumatic.  Neck: Normal range of motion.  Cardiovascular:  Tachycardic  Pulmonary/Chest:  Increased work of breathing  Skin: Skin is warm and dry.  Psychiatric:  Affect constricted, sad  Nursing note and vitals reviewed.           Vital Signs: BP 122/74   Pulse (!) 115   Temp 99.1 F (37.3 C) (Axillary)   Resp 16    Ht _0  (1.651 m)   Wt 62.5 kg (137 lb 12.6 oz)   SpO2 98%   BMI 22.93 kg/m  SpO2: SpO2: 98 % O2 Device: O2 Device: Nasal Cannula O2 Flow Rate: O2 Flow Rate (L/min): 2 L/min  Intake/output summary:  Intake/Output Summary (Last 24 hours) at 09/13/17 1017 Last data filed at 09/13/17 0700  Gross per 24 hour  Intake              650 ml  Output             -  205 ml  Net              855 ml   LBM: Last BM Date: 09/12/17 Baseline Weight: Weight: 56.7 kg (125 lb) Most recent weight: Weight: 62.5 kg (137 lb 12.6 oz)       Palliative Assessment/Data:    Flowsheet Rows     Most Recent Value  Intake Tab  Referral Department  Critical care  Unit at Time of Referral  ICU  Palliative Care Primary Diagnosis  Sepsis/Infectious Disease  Date Notified  09/10/17  Palliative Care Type  New Palliative care  Reason for referral  Clarify Goals of Care  Date of Admission  09/10/17  Date first seen by Palliative Care  09/12/17  # of days Palliative referral response time  2 Day(s)  # of days IP prior to Palliative referral  0  Clinical Assessment  Palliative Performance Scale Score  30%  Pain Max last 24 hours  Not able to report  Pain Min Last 24 hours  Not able to report  Dyspnea Max Last 24 Hours  Not able to report  Dyspnea Min Last 24 hours  Not able to report  Nausea Max Last 24 Hours  Not able to report  Nausea Min Last 24 Hours  Not able to report  Anxiety Max Last 24 Hours  Not able to report  Anxiety Min Last 24 Hours  Not able to report  Other Max Last 24 Hours  Not able to report  Psychosocial & Spiritual Assessment  Palliative Care Outcomes  Patient/Family meeting held?  Yes  Who was at the meeting?  met with dtr [place]  Provided psychosocial or spiritual support, Counseled regarding hospice  Palliative Care follow-up planned  Yes, Facility      Patient Active Problem List   Diagnosis Date Noted  . Bacteremia due to Escherichia coli  09/12/2017  . Acidosis, metabolic   . Encephalopathy   . Hyponatremia   . End-stage renal disease on hemodialysis ([place])   . Hyperkalemia   . Accelerated hypertension   . Anxiety state (neurotic) 03/25/2017  . History of total right hip arthroplasty 03/23/2017  . Impaired mobility and ADLs 03/23/2017  . Lives alone with some help available 03/23/2017  . Tooth pain 03/23/2017  . History of alcohol dependence ([place]) 03/23/2017  . History of gastric ulcer with bleeding 03/23/2017  . Sacral decubitus ulcer, stage II   . Anemia of renal disease 03/13/2017  . Malnutrition of moderate degree 03/11/2017  . Closed displaced fracture of right femoral neck ([place])   . Possible Cognitive impairment 10/23/2016  . Pancreatic cyst   . HTN (hypertension) 12/29/2013  . PAF (paroxysmal atrial fibrillation) ([place]) 12/29/2013    Palliative Care Assessment & Plan   Patient Profile: 83 y.o. female  with past medical history of Anemia, dementia, end-stage renal disease on hemodialysis (patient has been missing dialysis appointment since beginning dialysis in September 2018,) arthritis, atrial flutter, A. fib, hip fracture, Escherichia coli urinary tract infection, hypertension, protein calorie malnutrition, small bowel obstruction, upper GI bleed, admitted on 09/10/2017 with encephalopathy. Upon arrival to the emergency room her potassium was 7.5 CO2 less than 7, lactic acid 2.39 and temperature 90.4 Fahrenheit. She also has positive blood cultures, Escherichia coli bacteremia. We have attempted to dialyze her beginning on 09/10/2017 the patient becomes so tachycardic that dialysis has had to be terminated before she could complete a full treatment. Additionally she has  been hypoglycemic  despite being on D10  Since progressing to stage V renal disease and starting hemodialysis, patient has been missing dialysis appointments. Per chart review, as well as discussion with daughter, patient has dementia, lives alone,  and difficulty getting to dialysis as well as intolerance secondary to cramps  Assessment: Attempted to update family this morning; called daughter, [name] at [phone], LM; son, Josephine Igo at [phone], Coralee Pesa  At [phone], no answer and mailbox was full  Recommendations/Plan:  Patient is complaining of pain and appears short of breath. We'll add low-dose scheduled Dilaudid at 0.25 mg every 6 hours and continue with as needed every 2 hours  Goals of Care and Additional Recommendations:  Limitations on Scope of Treatment: Full Comfort Care  Code Status:    Code Status Orders        Start     Ordered   09/12/17 1623  Do not attempt resuscitation (DNR)  Continuous    Question Answer Comment  In the event of cardiac or respiratory ARREST Do not call a "code blue"   In the event of cardiac or respiratory ARREST Do not perform Intubation, CPR, defibrillation or ACLS   In the event of cardiac or respiratory ARREST Use medication by any route, position, wound care, and other measures to relive pain and suffering. May use oxygen, suction and manual treatment of airway obstruction as needed for comfort.      09/12/17 1624    Code Status History    Date Active Date Inactive Code Status Order ID Comments User Context   09/10/2017  8:19 PM 09/12/2017  4:22 PM Full Code [id number]  Omar Person, NP ED   06/19/2017  9:38 PM 06/23/2017  7:08 PM Full Code [id number]  Sela Hilding, MD Inpatient   03/10/2017 12:49 AM 03/19/2017  4:29 PM Full Code [id number]  Carlyle Dolly, MD Inpatient   01/31/2017  3:13 PM 02/01/2017  6:40 PM Full Code [id number]  Conrad Cassel, MD Inpatient   01/29/2017  3:53 PM 01/30/2017  2:14 PM Full Code [id number]  Ulyses Amor, PA-C Inpatient   10/23/2016  5:58 PM 10/28/2016  7:42 PM Full Code [id number]  Samella Parr, NP Inpatient   03/15/2015  6:17 PM 03/21/2015  7:40 PM Full Code [id number]  Leone Haven, MD Inpatient    12/28/2013  6:34 PM 12/31/2013  4:47 PM Full Code [id number]  Shanda Howells, MD ED       Prognosis:   < 2 weeks in the setting of stopping hemodialysis, bacteremia  Discharge Planning:  Anticipated Hospital Death  Care plan was discussed with Dr. Jonnie Finner  Thank you for allowing the Palliative Medicine Team to assist in the care of this patient.   Time In: 0800 Time Out: 0830 Total Time 30 min Prolonged Time Billed  no       Greater than 50%  of this time was spent counseling and coordinating care related to the above assessment and plan.  Dory Horn, NP  Please contact Palliative Medicine Team phone at [phone] for questions and concerns.

## 2017-09-13 NOTE — Progress Notes (Signed)
Patient ID: Monica [name], female   DOB: [birth year], 83 y.o.   MRN: [id number]          [facility] for Infectious Disease    Date of Admission:  09/10/2017     Ms. Rabelo and her family have decided on full comfort care. She will stop hemodialysis. Ceftriaxone has been stopped as well. I will sign off now.         Cliffton Asters, MD [facility] for Infectious Disease [facility] [phone] pager   [phone] cell 09/13/2017, 11:42 AM

## 2017-09-13 NOTE — Progress Notes (Signed)
Comfort care, no further dialysis.  Will sign off.  Thanks to help of CCM and PC team.    Vinson Moselle MD [facility] 09/13/2017, 10:56 AM

## 2017-09-13 NOTE — Progress Notes (Signed)
PCCM PROGRESS NOTE   PATIENT DESCRIPTION: Monica [name] is a 83 y.o. female admitted on 09/10/2017 with altered mental status.  Found to have E coli bacteremia with sepsis.  She has history of ESRD on HD.  Concern for UTI or line sepsis.  She was seen by renal, ID, and vascular surgery.  Started on antibiotics.  HD catheter removed.  Palliative care consulted.  Family opted for DNR status and transition to comfort care.  SUBJECTIVE: C/o feeling weak, and body aches.  VITAL SIGNS: BP 122/74   Pulse (!) 115   Temp 99.1 F (37.3 C) (Axillary)   Resp 16   Ht  (1.651 m)   Wt 137 lb 12.6 oz (62.5 kg)   SpO2 98%   BMI 22.93 kg/m   PHYSICAL EXAM:  General - somnolent Eyes - pupils reactive ENT - no stridor Cardiac - irregular, no murmur Chest - no wheeze, rales Abd - soft, non tender Ext - no edema Skin - no rashes Neuro - mumbles few words  ASSESSMENT: Sepsis E coli bacteremia ESRD Acute metabolic encephalopathy Atrial fibrillation with rapid ventricular response Severe protein calorie malnutrition Anemia of critical illness and chronic disease Hypoglycemia from sepsis History of alcohol abuse History of depression History of dementia History of gastric ulcer  PLAN: DNR No further dialysis Antibiotics d/c'ed Prn dilaudid, ativan Prn robinul Oral feedings for comfort  Coralyn Helling, MD [facility] Pulmonary/Critical Care 09/13/2017, 2:43 PM Pager:  [phone] After 3pm call: [phone]

## 2017-09-14 ENCOUNTER — Encounter: Payer: Self-pay | Admitting: Family Medicine

## 2017-09-14 DIAGNOSIS — A419 Sepsis, unspecified organism: Secondary | ICD-10-CM

## 2017-09-14 MED ORDER — HYDROMORPHONE HCL 1 MG/ML IJ SOLN
0.2500 mg | INTRAMUSCULAR | Status: DC
  Administered 2017-09-14 – 2017-09-17 (×15): 0.25 mg via INTRAVENOUS
  Filled 2017-09-14 (×14): qty 1

## 2017-09-14 NOTE — Progress Notes (Signed)
Nutrition Brief Note  Chart reviewed. Pt now transitioning to comfort care.  No further nutrition interventions warranted at this time.  Please re-consult as needed.   Myleka Moncure A. Mihaela Fajardo, RD, LDN, CDE Pager: [phone] After hours Pager: [phone]  

## 2017-09-14 NOTE — Progress Notes (Signed)
Palliative Medicine RN Note: Reconsult order noted. Notes from weekend PMT coverage document the extensive communication with family.  I got to the room to see the patient. She is oriented to self and place but does not know the year or the president. No family is in the room. Flexiseal and Pure Wick in place (draining green/Hanneman urine). Cr > 12. Dextrose 10% continuous infusion in place. Abx have been stopped. Last HD appears to be 10/6.   Patient is getting q6h scheduled hydromorphone 0.25 mg IV. No PRNs have been used. On questioning, pt reports she feels "bad" and that her "stomach hurts," but she can't clarify feeling "bad" or rate/localize pain.   Daughter "Monica [name]" arrived to visit; I sat with her to discuss possible hospice/progression of disease. She was very surprised to hear that pt's prognosis is approximately 2 weeks d/t stopping HD. She became tearful and asked what it means for her stopping HD. We discussed what can be expected for her mother and the goal of keeping her as comfortable as possible. She requested I call her brother Monica [name] [phone] to let him know she is dying, and she also states she has another brother who "is a bad alcoholic." Discussed location of care; she refused home and NF then voluntarily said, "I live in [place], and my brother and I talked, and we are NOT going to do hospice. Can she stay here?" We discussed likelihood of needing disposition decision; however, she is on a D10 continuous infusion, and will likely not be accepted by hospice as long as that is running.   I called Monica [name]. He was surprised that Monica [name] was unaware of short prognosis, as he said they were told very clearly over the weekend that she is dying. We discussed his mom and her comfort, and he verbalized clear understanding that she is dying.   Discussed case with Dr Linna Darner. She gave order in change frequency of scheduled hydromorphone to cover for pain that is likely under treated.  Plan for PMT NP to follow up tomorrow am and to call brother with update (he will be in a class starting at noon [place] time).  Margret Chance Willian Donson, RN, BSN, [facility] 09/14/2017 5:11 PM Cell [phone] 8:00-4:00 Monday-Friday Office [phone]

## 2017-09-14 NOTE — Progress Notes (Signed)
Patient has never established with [facility] as patient and is likely under the care with Marletta Lor NP at [facility]. Please see below for chain of events leading to my name on the patient chart.   Patient was admitted as an unassigned to family medicine census in April 2018 at which time I took care of the patient and she stated that she did not have a PCP. Offered patient to be seen as continuity patient at [facility], she verbally agreed and I placed myself as the PCP in the chart. She failed to follow up as planned despite several phone calls with patient/family member/neighbor Arline Asp and in person discussion with patient on subsequent admission. Home health aide called regarding confusion over patient medications and upon review of [place] [facility] found that patient has been getting refills of xanax from prescriber Marletta Lor in [place]. Please see my documentation of telephone encounter on 07/23/17 to clarify PCP role, no response back from Marletta Lor NP. Reviewed PMP today and can see that Marletta Lor NP has continued to prescribe patient refills but last refill on 08/18/17. Since patient has not establish with [facility] and regarding multiple provider involvement discussed with attending Dr. Lum Babe today who stated that most likely PCP is Marletta Lor NP and that I had mistakenly put my name on the chart back in April 2018. Dr. Lum Babe recommended that I document this chain of events and to change the PCP back to Marletta Lor NP.

## 2017-09-14 NOTE — Progress Notes (Signed)
eLink Physician-Brief Progress Note Patient Name: Monica [name] DOB: 17-Jan-1932 MRN: [id number]   Date of Service  09/14/2017  HPI/Events of Note  Rectal pouch leaking - request for flexiseal.   eICU Interventions  Will order Flexiseal.      Intervention Category Intermediate Interventions: Other: Minor Interventions: Agitation / anxiety - evaluation and management  [name] 09/14/2017, 4:25 AM

## 2017-09-14 NOTE — Progress Notes (Signed)
PROGRESS NOTE   TZIPORAH KNOKE  ZOX:[id number]    DOB: 1932/07/23    DOA: 09/10/2017  PCP: Marletta Lor, NP   I have briefly reviewed patients previous medical records in [facility].  Brief Narrative:  83 year old female with ESRD on HD was chronically noncompliant, HTN, HLD, hip fracture repair, dementia, anemia, was admitted to ICU by CCM on 09/10/17 for altered mental status. Workup revealed Escherichia coli bacteremia with sepsis. Concern for UTI or line sepsis. She was seen by renal, ID and vascular surgery. Started on antibiotics. HD catheter was removed. Palliative care was consulted. Family opted for DO NOT RESUSCITATE status and she was transitioned to comfort care. She was transferred to medical floor and TRH service on 09/14/17.   Assessment & Plan:   Principal Problem:   Bacteremia due to Escherichia coli Active Problems:   Hyperkalemia   End-stage renal disease on hemodialysis (HCC)   Acidosis, metabolic   Encephalopathy   Altered mental status   Palliative care by specialist   1. Acute toxic and metabolic encephalopathy: Attributed to sepsis +/- uremia. As per nephrology, was not compliant with outpatient hemodialysis. Remains pleasantly confused today but not agitated. 2. Escherichia coli bacteremia/sepsis: Secondary to UTI versus HD catheter. HD catheter removed. Antibiotics discontinued. Comfort care. 3. ESRD on HD: HD catheter removed. No further dialysis. Comfort care. 4. Hyperkalemia: Resolved. 5. AG Metabolic Acidosis: d/t ESRD. Comfort Care 6. Anemia: Secondary to chronic kidney disease and critical illness 7. Severe protein calorie malnutrition 8. A. fib with RVR 9. Hypoglycemia: Due to sepsis and ESRD.    DVT prophylaxis: None at this time. Comfort care. Code Status: DO NOT RESUSCITATE Family Communication: None at bedside Disposition: To be determined.? Residential hospice.   Consultants:  CCM Nephrology Vascular surgery Infectious  disease Palliative care medicine   Procedures:  HD catheter removed  Antimicrobials:  Alt discontinued    Subjective: Seen this morning. Pleasantly confused. Oriented to self and partly to place. Denies pain or dyspnea. Doesn't say much after that.   ROS: As per RN, no acute issues reported.  Objective:  Vitals:   09/12/17 1700 09/12/17 1800 09/13/17 0640 09/14/17 0446  BP: 114/72 134/86 122/74 (!) 148/73  Pulse: (!) 117 (!) 121 (!) 115 (!) 101  Resp: Temp:   99.1 F (37.3 C) 98.8 F (37.1 C)  TempSrc:   Axillary Axillary  SpO2: 92% 99% 98% 100%  Weight:      Height:        Examination:  General exam: Elderly female, moderately built, poorly nourished, chronically looking, lying comfortably propped up in bed. Respiratory system: Diminished breath sounds in the bases with occasional basal crackles but otherwise clear to auscultation. Respiratory effort normal. Cardiovascular system: S1 & S2 heard, RRR. No JVD, murmurs, rubs, gallops or clicks. Trace bilateral ankle edema Gastrointestinal system: Abdomen is nondistended, soft and nontender. No organomegaly or masses felt. Normal bowel sounds heard. Central nervous system: Alert and oriented to self and partly to place. No focal neurological deficits. Extremities: Moving all extremities symmetrically. Skin: No rashes, lesions or ulcers Psychiatry: Judgement and insight are impaired. Mood & affect appropriate.     Data Reviewed: I have personally reviewed following labs and imaging studies  CBC:  Recent Labs Lab 09/10/17 1743 09/10/17 2028 09/10/17 2344 09/11/17 1354 09/12/17 0253  WBC 13.0* 10.0 4.8 13.4* 21.1*  NEUTROABS 11.3*  --   --   --  19.0*  HGB 9.4* 9.8*  10.2* 9.8* 8.7*  HCT 29.3* 30.8* 30.8* 28.0* 24.2*  MCV 88.5 88.0 86.5 81.2 79.3  PLT 227 234 247 198 173   Basic Metabolic Panel:  Recent Labs Lab 09/10/17 1743 09/10/17 2028 09/10/17 2344 09/11/17 0120 09/11/17 0345  09/11/17 1015 09/12/17 0253  NA 134*  --  137  --  138 139 137  K >7.5*  --  >7.5* 4.9 5.2* 4.7 4.3  CL 103  --  104  --  101 103 97*  CO2 <7*  --  <7*  --  10* 10* 14*  GLUCOSE 111*  --  78  --  73 75 71  BUN 135*  --  138*  --  106* 113* 96*  CREATININE 19.12* 18.60* 18.38*  --  14.34* 15.21* 12.49*  CALCIUM 8.5*  --  8.4*  --  8.1* 7.5* 7.4*  MG  --   --  2.3  --  1.9  --   --   PHOS  --   --  11.7*  --  8.4*  --  6.5*   Liver Function Tests:  Recent Labs Lab 09/10/17 1743 09/11/17 1015  AST 19 33  ALT 10* 16  ALKPHOS 94 63  BILITOT 1.4* 1.2  PROT 7.4 6.2*  ALBUMIN 3.7 2.9*   Coagulation Profile: No results for input(s): INR, PROTIME in the last 168 hours. Cardiac Enzymes:  Recent Labs Lab 09/10/17 1743 09/10/17 2028  CKTOTAL 268*  --   TROPONINI  --  0.04*   HbA1C: No results for input(s): HGBA1C in the last 72 hours. CBG:  Recent Labs Lab 09/12/17 0448 09/12/17 0803 09/12/17 0916 09/12/17 1147 09/12/17 1601  GLUCAP 112* 69 84 69 73    Recent Results (from the past 240 hour(s))  Blood Culture (routine x 2)     Status: Abnormal   Collection Time: 09/10/17  5:45 PM  Result Value Ref Range Status   Specimen Description BLOOD RIGHT ANTECUBITAL  Final   Special Requests   Final    BOTTLES DRAWN AEROBIC AND ANAEROBIC Blood Culture adequate volume   Culture  Setup Time   Final    GRAM NEGATIVE RODS IN BOTH AEROBIC AND ANAEROBIC BOTTLES CRITICAL RESULT CALLED TO, READ BACK BY AND VERIFIED WITH: PHARMD N BATCHELDER [phone] MLM    Culture ESCHERICHIA COLI (A)  Final   Report Status 09/13/2017 FINAL  Final   Organism ID, Bacteria ESCHERICHIA COLI  Final      Susceptibility   Escherichia coli - MIC*    AMPICILLIN <=2 SENSITIVE Sensitive     CEFAZOLIN <=4 SENSITIVE Sensitive     CEFEPIME <=1 SENSITIVE Sensitive     CEFTAZIDIME <=1 SENSITIVE Sensitive     CEFTRIAXONE <=1 SENSITIVE Sensitive     CIPROFLOXACIN <=0.25 SENSITIVE Sensitive      GENTAMICIN <=1 SENSITIVE Sensitive     IMIPENEM <=0.25 SENSITIVE Sensitive     TRIMETH/SULFA <=20 SENSITIVE Sensitive     AMPICILLIN/SULBACTAM <=2 SENSITIVE Sensitive     PIP/TAZO <=4 SENSITIVE Sensitive     Extended ESBL NEGATIVE Sensitive     * ESCHERICHIA COLI  Blood Culture ID Panel (Reflexed)     Status: Abnormal   Collection Time: 09/10/17  5:45 PM  Result Value Ref Range Status   Enterococcus species NOT DETECTED NOT DETECTED Final   Listeria monocytogenes NOT DETECTED NOT DETECTED Final   Staphylococcus species NOT DETECTED NOT DETECTED Final   Staphylococcus aureus NOT DETECTED NOT DETECTED Final   Streptococcus  species NOT DETECTED NOT DETECTED Final   Streptococcus agalactiae NOT DETECTED NOT DETECTED Final   Streptococcus pneumoniae NOT DETECTED NOT DETECTED Final   Streptococcus pyogenes NOT DETECTED NOT DETECTED Final   Acinetobacter baumannii NOT DETECTED NOT DETECTED Final   Enterobacteriaceae species DETECTED (A) NOT DETECTED Final    Comment: Enterobacteriaceae represent a large family of gram-negative bacteria, not a single organism. CRITICAL RESULT CALLED TO, READ BACK BY AND VERIFIED WITH: PHARMD N BATCHELDER [phone] MLM    Enterobacter cloacae complex NOT DETECTED NOT DETECTED Final   Escherichia coli DETECTED (A) NOT DETECTED Final    Comment: CRITICAL RESULT CALLED TO, READ BACK BY AND VERIFIED WITH: PHARMD N BATCHELDER [phone] MLM    Klebsiella oxytoca NOT DETECTED NOT DETECTED Final   Klebsiella pneumoniae NOT DETECTED NOT DETECTED Final   Proteus species NOT DETECTED NOT DETECTED Final   Serratia marcescens NOT DETECTED NOT DETECTED Final   Carbapenem resistance NOT DETECTED NOT DETECTED Final   Haemophilus influenzae NOT DETECTED NOT DETECTED Final   Neisseria meningitidis NOT DETECTED NOT DETECTED Final   Pseudomonas aeruginosa NOT DETECTED NOT DETECTED Final   Candida albicans NOT DETECTED NOT DETECTED Final   Candida glabrata NOT DETECTED  NOT DETECTED Final   Candida krusei NOT DETECTED NOT DETECTED Final   Candida parapsilosis NOT DETECTED NOT DETECTED Final   Candida tropicalis NOT DETECTED NOT DETECTED Final  Blood Culture (routine x 2)     Status: Abnormal   Collection Time: 09/10/17  5:48 PM  Result Value Ref Range Status   Specimen Description BLOOD RIGHT ARM  Final   Special Requests   Final    BOTTLES DRAWN AEROBIC AND ANAEROBIC Blood Culture adequate volume   Culture  Setup Time   Final    GRAM NEGATIVE RODS IN BOTH AEROBIC AND ANAEROBIC BOTTLES CRITICAL VALUE NOTED.  VALUE IS CONSISTENT WITH PREVIOUSLY REPORTED AND CALLED VALUE.    Culture (A)  Final    ESCHERICHIA COLI SUSCEPTIBILITIES PERFORMED ON PREVIOUS CULTURE WITHIN THE LAST 5 DAYS.    Report Status 09/13/2017 FINAL  Final  MRSA PCR Screening     Status: None   Collection Time: 09/10/17  9:25 PM  Result Value Ref Range Status   MRSA by PCR NEGATIVE NEGATIVE Final    Comment:        The GeneXpert MRSA Assay (FDA approved for NASAL specimens only), is one component of a comprehensive MRSA colonization surveillance program. It is not intended to diagnose MRSA infection nor to guide or monitor treatment for MRSA infections.   Culture, blood (single)     Status: None (Preliminary result)   Collection Time: 09/11/17 12:02 AM  Result Value Ref Range Status   Specimen Description BLOOD LEFT ANTECUBITAL  Final   Special Requests   Final    BOTTLES DRAWN AEROBIC AND ANAEROBIC Blood Culture adequate volume   Culture NO GROWTH 3 DAYS  Final   Report Status PENDING  Incomplete  C difficile quick scan w PCR reflex     Status: None   Collection Time: 09/11/17 11:20 AM  Result Value Ref Range Status   C Diff antigen NEGATIVE NEGATIVE Final   C Diff toxin NEGATIVE NEGATIVE Final   C Diff interpretation No C. difficile detected.  Final  Cath Tip Culture     Status: None (Preliminary result)   Collection Time: 09/12/17  1:28 PM  Result Value Ref Range  Status   Specimen Description CATH TIP  Final  Special Requests NONE  Final   Culture NO GROWTH 2 DAYS  Final   Report Status PENDING  Incomplete         Radiology Studies: No results found.      Scheduled Meds: .  HYDROmorphone (DILAUDID) injection  0.25 mg Intravenous Q6H  . mouth rinse  15 mL Mouth Rinse q12n4p   Continuous Infusions: . dextrose 10 mL/hr at 09/12/17 1823     LOS: 4 days     Brecklynn Jian, MD, FACP, FHM. [facility] Pager [phone]  If 7PM-7AM, please contact night-coverage [email or web address] Password [email or web address] 09/14/2017, 3:03 PM

## 2017-09-14 NOTE — Progress Notes (Signed)
Pt rectal pouch was leaking Dr. Levada Schilling ordered flexiseal, I observed when I removed the rectal pouch the rectal area has abrasion, applied skin barrier, no leaking in flexiseal.

## 2017-09-15 LAB — POCT I-STAT 3, VENOUS BLOOD GAS (G3P V)
ACID-BASE DEFICIT: 29 mmol/L — AB (ref 0.0–2.0)
Bicarbonate: 3.8 mmol/L — ABNORMAL LOW (ref 20.0–28.0)
O2 SAT: 97 %
PCO2 VEN: 21 mmHg — AB (ref 44.0–60.0)
pH, Ven: 6.86 — CL (ref 7.250–7.430)
pO2, Ven: 154 mmHg — ABNORMAL HIGH (ref 32.0–45.0)

## 2017-09-15 LAB — CATH TIP CULTURE: CULTURE: NO GROWTH

## 2017-09-15 MED ORDER — SODIUM CHLORIDE 0.9 % IV SOLN
INTRAVENOUS | Status: DC
  Administered 2017-09-15: 12:00:00 via INTRAVENOUS

## 2017-09-15 NOTE — Progress Notes (Signed)
Daily Progress Note   Patient Name: Monica [name]       Date: 09/15/2017 DOB: 01-12-1932  Age: 83 y.o. MRN#: [id number] Attending Physician: Nita Sells, MD Primary Care Physician: Alvester Chou, NP Admit Date: 09/10/2017  Reason for Consultation/Follow-up: Establishing goals of care, Hospice Evaluation, Psychosocial/spiritual support and Terminal Care  Subjective: Monica [name] is alert and talks to [place]. No family at bedside.   Length of Stay: 5  Current Medications: Scheduled Meds:  .  HYDROmorphone (DILAUDID) injection  0.25 mg Intravenous Q4H  . mouth rinse  15 mL Mouth Rinse q12n4p    Continuous Infusions: . sodium chloride 10 mL/hr at 09/15/17 1145    PRN Meds: acetaminophen **OR** acetaminophen, glycopyrrolate, HYDROmorphone (DILAUDID) injection, LORazepam  Physical Exam  Constitutional: She appears well-developed.  HENT:  Head: Normocephalic and atraumatic.  Cardiovascular: Normal rate and regular rhythm.   Pulmonary/Chest: Effort normal. No accessory muscle usage. No tachypnea. No respiratory distress.  Abdominal: Normal appearance.  Neurological: She is alert. She is disoriented.  Nursing note and vitals reviewed.           Vital Signs: BP (!) 171/92 (BP Location: Right Arm)   Pulse 97   Temp 99 F (37.2 C) (Oral)   Resp 18   Ht _0  (1.651 m)   Wt 62.5 kg (137 lb 12.6 oz)   SpO2 100%   BMI 22.93 kg/m  SpO2: SpO2: 100 % O2 Device: O2 Device: Not Delivered O2 Flow Rate: O2 Flow Rate (L/min): 2 L/min  Intake/output summary:  Intake/Output Summary (Last 24 hours) at 09/15/17 1457 Last data filed at 09/15/17 1300  Gross per 24 hour  Intake           639.16 ml  Output              550 ml  Net            89.16 ml   LBM: Last BM Date:  09/12/17 Baseline Weight: Weight: 56.7 kg (125 lb) Most recent weight: Weight: 62.5 kg (137 lb 12.6 oz)       Palliative Assessment/Data: 30%    Flowsheet Rows     Most Recent Value  Intake Tab  Referral Department  Critical care  Unit at Time of Referral  ICU  Palliative Care Primary Diagnosis  Sepsis/Infectious Disease  Date Notified  09/10/17  Palliative Care Type  New Palliative care  Reason for referral  Clarify Goals of Care  Date of Admission  09/10/17  Date first seen by Palliative Care  09/12/17  # of days Palliative referral response time  2 Day(s)  # of days IP prior to Palliative referral  0  Clinical Assessment  Palliative Performance Scale Score  30%  Pain Max last 24 hours  Not able to report  Pain Min Last 24 hours  Not able to report  Dyspnea Max Last 24 Hours  Not able to report  Dyspnea Min Last 24 hours  Not able to report  Nausea Max Last 24 Hours  Not able to report  Nausea Min Last 24 Hours  Not able to report  Anxiety Max Last 24 Hours  Not able to report  Anxiety Min Last 24 Hours  Not able to report  Other Max Last 24 Hours  Not able to report  Psychosocial & Spiritual Assessment  Palliative Care Outcomes  Patient/Family meeting held?  Yes  Who was at the meeting?  met with dtr [place]  Provided psychosocial or spiritual support, Counseled regarding hospice  Palliative Care follow-up planned  Yes, Facility      Patient Active Problem List   Diagnosis Date Noted  . Altered mental status   . Palliative care by specialist   . Bacteremia due to Escherichia coli 09/12/2017  . Acidosis, metabolic   . Encephalopathy   . Hyponatremia   . End-stage renal disease on hemodialysis ([place])   . Hyperkalemia   . Accelerated hypertension   . Anxiety state (neurotic) 03/25/2017  . History of total right hip arthroplasty 03/23/2017  . Impaired mobility and ADLs 03/23/2017  . Lives alone with some help available 03/23/2017   . Tooth pain 03/23/2017  . History of alcohol dependence ([place]) 03/23/2017  . History of gastric ulcer with bleeding 03/23/2017  . Sacral decubitus ulcer, stage II   . Anemia of renal disease 03/13/2017  . Malnutrition of moderate degree 03/11/2017  . Closed displaced fracture of right femoral neck ([place])   . Possible Cognitive impairment 10/23/2016  . Pancreatic cyst   . HTN (hypertension) 12/29/2013  . PAF (paroxysmal atrial fibrillation) ([place]) 12/29/2013    Palliative Care Assessment & Plan   HPI: 83 y.o.femalewith past medical history of Anemia, dementia, end-stage renal disease on hemodialysis (patient has been missing dialysis appointment since beginning dialysis in September 2018,) arthritis, atrial flutter, A. fib, hip fracture, Escherichia coli urinary tract infection, hypertension, protein calorie malnutrition, small bowel obstruction, upper GI bleed,admitted on 10/4/2018with encephalopathy. Upon arrival to the emergency room her potassium was 7.5 CO2 less than 7, lactic acid 2.39 and temperature 90.4 Fahrenheit. She also has positive blood cultures, Escherichia coli bacteremia. We have attempted to dialyze her beginning on 09/10/2017 the patient becomes so tachycardic that dialysis has had to be terminated before she could complete a full treatment. Additionally she has been hypoglycemic despite being on D10  Since progressing to stage V renal disease and startinghemodialysis,patient has been missing dialysis appointments. Per chart review, as well as discussion with daughter, patient has dementia, lives alone, and difficulty getting to dialysis as well as intolerance secondary to cramps.   Assessment: Myself and Kathie Rhodes, NP met at Monica [name]'s bedside. She is alert but somewhat confused to situation. We reminded her about not doing dialysis anymore and she says "that's okay." She recognizes  that dialysis has been extremely difficult for her and she talks about being  ready to die when her time comes. She has no concerns regarding dying. We reassured her that our goal is to help to make sure she is happy and comfortable and she thanked [place] for this. She did request visit from chaplain from [facility] [employer] for support.   I spoke with daughter, Stepahnie Campo, via telephone. Aneth has better understanding today that her mother is dying. We spoke of goals of comfort and Faiza agrees. We also spoke more about transition to hospice facility and Shauntelle states their brother is in [place] and could not get to her in [place]. We discussed transition to hospice facility here in [place] that will be a more comfortable environment for her mother. Loyd says she will speak with her brothers about hospice facility in [place].   I have left message for son, [name], to further discuss [facility] and transition to hospice facility. Await return call.   Recommendations/Plan:  Continue dilaudid 0.25 mg every 4 hours scheduled. Dilaudid 0.5 mg every 2 hours prn. Pain is much better today she says.   D10 infusion stopped as we are not checking blood sugars and she has stopped dialysis.   No further complaints.   Goals of Care and Additional Recommendations:  Limitations on Scope of Treatment: Full Comfort Care  Code Status:  DNR  Prognosis:   < 2 weeks  Discharge Planning:  Hospice facility   Thank you for allowing the Palliative Medicine Team to assist in the care of this patient.   Total Time 35 min Prolonged Time Billed  no       Greater than 50%  of this time was spent counseling and coordinating care related to the above assessment and plan.  Vinie Sill, NP Palliative Medicine Team Pager # [phone] (M-F 8a-5p) Team Phone # [phone] (Nights/Weekends)

## 2017-09-15 NOTE — Care Management Note (Signed)
Case Management Note  Patient Details  Name: Monica [name] MRN: [id number] Date of Birth: July 23, 1932  Subjective/Objective:                    Action/Plan:  Consult for : Please offer choice of Hopsice--[facility] has no beds--can be d/c asap. Consulted SW  Expected Discharge Date:                  Expected Discharge Plan:  Hospice Medical Facility  In-House Referral:  Clinical Social Work  Discharge planning Services     Post Acute Care Choice:    Choice offered to:     DME Arranged:    DME Agency:     HH Arranged:    HH Agency:     Status of Service:  In process, will continue to follow  If discussed at Long Length of Stay Meetings, dates discussed:    Additional Comments:  Kingsley Plan, RN 09/15/2017, 4:13 PM

## 2017-09-15 NOTE — Progress Notes (Signed)
PROGRESS NOTE   Monica [name]  WJX:[id number]    DOB: 1932/03/15    DOA: 09/10/2017  PCP: Marletta Lor, NP   I have briefly reviewed patients previous medical records in [facility].  Brief Narrative:  85-year-female  ESRD on HD was chronically noncompliant,  HTN,  HLD,  hip fracture repair,  dementia,  anemia,   was admitted to ICU by CCM on 09/10/17 for altered mental status. Workup revealed Escherichia coli bacteremia with sepsis. Concern for UTI or line sepsis. She was seen by renal, ID and vascular surgery. Started on antibiotics. HD catheter was removed. Palliative care was consulted. Family opted for DO NOT RESUSCITATE status and she was transitioned to comfort care. She was transferred to medical floor and TRH service on 09/14/17.   Assessment & Plan:   Principal Problem:   Bacteremia due to Escherichia coli Active Problems:   Hyperkalemia   End-stage renal disease on hemodialysis (HCC)   Acidosis, metabolic   Encephalopathy   Altered mental status   Palliative care by specialist   1. Acute toxic and metabolic encephalopathy: Attributed to sepsis +/- uremia. As per nephrology, was not compliant with outpatient hemodialysis. Family have discussed with hospice and are agreeablen to placement--have discussed personally with the patient who understands that with lack of dialysis her prognosis is weeks 2. Escherichia coli bacteremia/sepsis: Secondary to UTI versus HD catheter. HD catheter removed. Antibiotics discontinued. Comfort care. 3. ESRD on HD: HD catheter removed. No further dialysis. Comfort care. 4. Hyperkalemia: Resolved. 5. AG Metabolic Acidosis: d/t ESRD. Comfort Care 6. Anemia: Secondary to chronic kidney disease and critical illness 7. Severe protein calorie malnutrition 8. A. fib with RVR 9. Hypoglycemia: Due to sepsis and ESRD.    DVT prophylaxis: None at this time. Comfort care. Code Status: DO NOT RESUSCITATE Family Communication: [facility]--ave offered choice of hospice   Consultants:  CC[facility] Nephrology Vascular surgery Infectious disease Palliative care medicine   Procedures:  HD catheter removed  Antimicrobials:  Alt discontinued    Subjective:  Alert, seems oriented, understands the meaning of no dialysis 8 some food this morning No chest pain no nausea Seems oriented  Objective:  Vitals:   09/14/17 2100 09/15/17 0110 09/15/17 0656 09/15/17 1438  BP: (!) 150/75  137/78 (!) 171/92  Pulse: 100 94 (!) 104 97  Resp:  Temp: 98.9 F (37.2 C)  98.6 F (37 C) 99 F (37.2 C)  TempSrc: Axillary  Oral Oral  SpO2: 98% 97% 100% 100%  Weight:      Height:        Examination:  Thin asthenic, bitemporalis wasting Chest clear S1-S2 no murmur rub or gallopAbdomen soft nontender No lower extremity edema Neurologically seems intact moves limbs without deficit Is able to have fluent conversations with me--Does not seem confused    Data Reviewed: I have personally reviewed following labs and imaging studies  CBC:  Recent Labs Lab 09/10/17 1743 09/10/17 2028 09/10/17 2344 09/11/17 1354 09/12/17 0253  WBC 13.0* 10.0 4.8 13.4* 21.1*  NEUTROABS 11.3*  --   --   --  19.0*  HGB 9.4* 9.8* 10.2* 9.8* 8.7*  HCT 29.3* 30.8* 30.8* 28.0* 24.2*  MCV 88.5 88.0 86.5 81.2 79.3  PLT 227 234 247 198 173   Basic Metabolic Panel:  Recent Labs Lab 09/10/17 1743 09/10/17 2028 09/10/17 2344 09/11/17 0120 09/11/17 0345 09/11/17 1015 09/12/17 0253  NA 134*  --  137  --  138 139 137  K >7.5*  --  >7.5* 4.9 5.2* 4.7 4.3  CL 103  --  104  --  101 103 97*  CO2 <7*  --  <7*  --  10* 10* 14*  GLUCOSE 111*  --  78  --  73 75 71  BUN 135*  --  138*  --  106* 113* 96*  CREATININE 19.12* 18.60* 18.38*  --  14.34* 15.21* 12.49*  CALCIUM 8.5*  --  8.4*  --  8.1* 7.5* 7.4*  MG  --   --  2.3  --  1.9  --   --   PHOS  --   --  11.7*  --  8.4*  --  6.5*   Liver Function Tests:  Recent Labs Lab  09/10/17 1743 09/11/17 1015  AST 19 33  ALT 10* 16  ALKPHOS 94 63  BILITOT 1.4* 1.2  PROT 7.4 6.2*  ALBUMIN 3.7 2.9*   Coagulation Profile: No results for input(s): INR, PROTIME in the last 168 hours. Cardiac Enzymes:  Recent Labs Lab 09/10/17 1743 09/10/17 2028  CKTOTAL 268*  --   TROPONINI  --  0.04*   HbA1C: No results for input(s): HGBA1C in the last 72 hours. CBG:  Recent Labs Lab 09/12/17 0448 09/12/17 0803 09/12/17 0916 09/12/17 1147 09/12/17 1601  GLUCAP 112* 69 84 69 73    Recent Results (from the past 240 hour(s))  Blood Culture (routine x 2)     Status: Abnormal   Collection Time: 09/10/17  5:45 PM  Result Value Ref Range Status   Specimen Description BLOOD RIGHT ANTECUBITAL  Final   Special Requests   Final    BOTTLES DRAWN AEROBIC AND ANAEROBIC Blood Culture adequate volume   Culture  Setup Time   Final    GRAM NEGATIVE RODS IN BOTH AEROBIC AND ANAEROBIC BOTTLES CRITICAL RESULT CALLED TO, READ BACK BY AND VERIFIED WITH: PHARMD N BATCHELDER [phone] MLM    Culture ESCHERICHIA COLI (A)  Final   Report Status 09/13/2017 FINAL  Final   Organism ID, Bacteria ESCHERICHIA COLI  Final      Susceptibility   Escherichia coli - MIC*    AMPICILLIN <=2 SENSITIVE Sensitive     CEFAZOLIN <=4 SENSITIVE Sensitive     CEFEPIME <=1 SENSITIVE Sensitive     CEFTAZIDIME <=1 SENSITIVE Sensitive     CEFTRIAXONE <=1 SENSITIVE Sensitive     CIPROFLOXACIN <=0.25 SENSITIVE Sensitive     GENTAMICIN <=1 SENSITIVE Sensitive     IMIPENEM <=0.25 SENSITIVE Sensitive     TRIMETH/SULFA <=20 SENSITIVE Sensitive     AMPICILLIN/SULBACTAM <=2 SENSITIVE Sensitive     PIP/TAZO <=4 SENSITIVE Sensitive     Extended ESBL NEGATIVE Sensitive     * ESCHERICHIA COLI  Blood Culture ID Panel (Reflexed)     Status: Abnormal   Collection Time: 09/10/17  5:45 PM  Result Value Ref Range Status   Enterococcus species NOT DETECTED NOT DETECTED Final   Listeria monocytogenes NOT DETECTED  NOT DETECTED Final   Staphylococcus species NOT DETECTED NOT DETECTED Final   Staphylococcus aureus NOT DETECTED NOT DETECTED Final   Streptococcus species NOT DETECTED NOT DETECTED Final   Streptococcus agalactiae NOT DETECTED NOT DETECTED Final   Streptococcus pneumoniae NOT DETECTED NOT DETECTED Final   Streptococcus pyogenes NOT DETECTED NOT DETECTED Final   Acinetobacter baumannii NOT DETECTED NOT DETECTED Final   Enterobacteriaceae species DETECTED (A) NOT DETECTED Final    Comment: Enterobacteriaceae represent  a large family of gram-negative bacteria, not a single organism. CRITICAL RESULT CALLED TO, READ BACK BY AND VERIFIED WITH: PHARMD N BATCHELDER [phone] MLM    Enterobacter cloacae complex NOT DETECTED NOT DETECTED Final   Escherichia coli DETECTED (A) NOT DETECTED Final    Comment: CRITICAL RESULT CALLED TO, READ BACK BY AND VERIFIED WITH: PHARMD N BATCHELDER [phone] MLM    Klebsiella oxytoca NOT DETECTED NOT DETECTED Final   Klebsiella pneumoniae NOT DETECTED NOT DETECTED Final   Proteus species NOT DETECTED NOT DETECTED Final   Serratia marcescens NOT DETECTED NOT DETECTED Final   Carbapenem resistance NOT DETECTED NOT DETECTED Final   Haemophilus influenzae NOT DETECTED NOT DETECTED Final   Neisseria meningitidis NOT DETECTED NOT DETECTED Final   Pseudomonas aeruginosa NOT DETECTED NOT DETECTED Final   Candida albicans NOT DETECTED NOT DETECTED Final   Candida glabrata NOT DETECTED NOT DETECTED Final   Candida krusei NOT DETECTED NOT DETECTED Final   Candida parapsilosis NOT DETECTED NOT DETECTED Final   Candida tropicalis NOT DETECTED NOT DETECTED Final  Blood Culture (routine x 2)     Status: Abnormal   Collection Time: 09/10/17  5:48 PM  Result Value Ref Range Status   Specimen Description BLOOD RIGHT ARM  Final   Special Requests   Final    BOTTLES DRAWN AEROBIC AND ANAEROBIC Blood Culture adequate volume   Culture  Setup Time   Final    GRAM NEGATIVE  RODS IN BOTH AEROBIC AND ANAEROBIC BOTTLES CRITICAL VALUE NOTED.  VALUE IS CONSISTENT WITH PREVIOUSLY REPORTED AND CALLED VALUE.    Culture (A)  Final    ESCHERICHIA COLI SUSCEPTIBILITIES PERFORMED ON PREVIOUS CULTURE WITHIN THE LAST 5 DAYS.    Report Status 09/13/2017 FINAL  Final  MRSA PCR Screening     Status: None   Collection Time: 09/10/17  9:25 PM  Result Value Ref Range Status   MRSA by PCR NEGATIVE NEGATIVE Final    Comment:        The GeneXpert MRSA Assay (FDA approved for NASAL specimens only), is one component of a comprehensive MRSA colonization surveillance program. It is not intended to diagnose MRSA infection nor to guide or monitor treatment for MRSA infections.   Culture, blood (single)     Status: None (Preliminary result)   Collection Time: 09/11/17 12:02 AM  Result Value Ref Range Status   Specimen Description BLOOD LEFT ANTECUBITAL  Final   Special Requests   Final    BOTTLES DRAWN AEROBIC AND ANAEROBIC Blood Culture adequate volume   Culture NO GROWTH 4 DAYS  Final   Report Status PENDING  Incomplete  C difficile quick scan w PCR reflex     Status: None   Collection Time: 09/11/17 11:20 AM  Result Value Ref Range Status   C Diff antigen NEGATIVE NEGATIVE Final   C Diff toxin NEGATIVE NEGATIVE Final   C Diff interpretation No C. difficile detected.  Final  Cath Tip Culture     Status: None   Collection Time: 09/12/17  1:28 PM  Result Value Ref Range Status   Specimen Description CATH TIP  Final   Special Requests NONE  Final   Culture NO GROWTH 3 DAYS  Final   Report Status 09/15/2017 FINAL  Final         Radiology Studies: No results found.      Scheduled Meds: .  HYDROmorphone (DILAUDID) injection  0.25 mg Intravenous Q4H  . mouth rinse  15  mL Mouth Rinse q12n4p   Continuous Infusions: . sodium chloride 10 mL/hr at 09/15/17 1145     LOS: 5 days    15 minutes  Pleas Koch, MD [facility] [phone]

## 2017-09-15 NOTE — Progress Notes (Addendum)
Responded to [facility] consult to visit with patient.  Pt. sleeping but later awaken while there but staff was  caring for patient. Decided  not to disturb her. Prayed for patient and offered blessing.  Chaplain available as needed. Venida Jarvis, [place], [facility], Pager [phone]

## 2017-09-16 DIAGNOSIS — Z515 Encounter for palliative care: Secondary | ICD-10-CM

## 2017-09-16 DIAGNOSIS — Z7189 Other specified counseling: Secondary | ICD-10-CM

## 2017-09-16 LAB — CULTURE, BLOOD (SINGLE)
CULTURE: NO GROWTH
Special Requests: ADEQUATE

## 2017-09-16 LAB — RENAL FUNCTION PANEL
ANION GAP: 21 — AB (ref 5–15)
Albumin: 2.6 g/dL — ABNORMAL LOW (ref 3.5–5.0)
BUN: 119 mg/dL — ABNORMAL HIGH (ref 6–20)
CHLORIDE: 98 mmol/L — AB (ref 101–111)
CO2: 17 mmol/L — AB (ref 22–32)
Calcium: 8.4 mg/dL — ABNORMAL LOW (ref 8.9–10.3)
Creatinine, Ser: 13.66 mg/dL — ABNORMAL HIGH (ref 0.44–1.00)
GFR calc non Af Amer: 2 mL/min — ABNORMAL LOW (ref >60)
GFR, EST AFRICAN AMERICAN: 2 mL/min — AB (ref >60)
GLUCOSE: 107 mg/dL — AB (ref 65–99)
Phosphorus: 8.6 mg/dL — ABNORMAL HIGH (ref 2.5–4.6)
Potassium: 4.3 mmol/L (ref 3.5–5.1)
Sodium: 136 mmol/L (ref 135–145)

## 2017-09-16 LAB — CBC
HEMATOCRIT: 25.2 % — AB (ref 36.0–46.0)
HEMOGLOBIN: 8.4 g/dL — AB (ref 12.0–15.0)
MCH: 27.3 pg (ref 26.0–34.0)
MCHC: 33.3 g/dL (ref 30.0–36.0)
MCV: 81.8 fL (ref 78.0–100.0)
Platelets: 280 10*3/uL (ref 150–400)
RBC: 3.08 MIL/uL — AB (ref 3.87–5.11)
RDW: 16.2 % — ABNORMAL HIGH (ref 11.5–15.5)
WBC: 12.6 10*3/uL — ABNORMAL HIGH (ref 4.0–10.5)

## 2017-09-16 MED ORDER — INFLUENZA VAC SPLIT HIGH-DOSE 0.5 ML IM SUSY
0.5000 mL | PREFILLED_SYRINGE | INTRAMUSCULAR | Status: DC
  Filled 2017-09-16: qty 0.5

## 2017-09-16 NOTE — Clinical Social Work Note (Addendum)
Clinical Social Work Assessment  Patient Details  Name: Monica [name] MRN: [id number] Date of Birth: 1932-09-16  Date of referral:                  Reason for consult:  Facility Placement                Permission sought to share information with:  Monica [name] granted to share information::  Yes, Verbal Permission Granted  Name::     Monica [name], Monica [name]::  Monica [name]   Relationship::  Monica [name]  Contact Information:     Housing/Transportation Living arrangements for the past 2 months:  Single Family Home Source of Information:  Adult Children Patient Interpreter Needed:  None Criminal Activity/Legal Involvement Pertinent to Current Situation/Hospitalization:  No - Comment as needed Significant Relationships:  Adult Children Lives with:  Self, Friends Do you feel safe going back to the place where you live?  No Need for family participation in patient care:  Yes (Comment)  Care giving concerns:  Patient is in comfort care and palliative has made the recommendation for Monica [name]. Family has already met with palliative team and discussed options. CSW is now involved with referring to [facility] and/or [facility]. CSW spoke with son Monica [name] who advised that CSW should speak with sister Monica [name] to discuss further as he is in [place]. CSW called Monica [name] and left message for f/u.  CSW will refer to Monica [name] once contact is made with Monica [name] Monica [name].  CSW has called the sister twice and will continue to follow up.  CSW needs to make contact with Monica [name] before referral to Monica [name] per brother Monica [name].  Social Worker assessment / plan:  CSW will continue to follow. CSW will make referral to [facility] and [facility].  Employment status:  Retired Forensic scientist:  Managed Care PT Recommendations:  No Follow Up Information / Referral to community resources:  Other (Comment Required) (Monica [name])  Patient/Family's Response to  care:  Family appreciative of CSW assistance with Monica [name] referral. No other issues identified at this time.  Patient/Family's Understanding of and Emotional Response to Diagnosis, Current Treatment, and Prognosis:  Family has good understanding of diagnosis, current treatment and prognosis. Family is aware of the need for Monica [name] as patient is really declining and has requested to stop HD. Palliative team and medical team has met with family to discuss options. No issues or concerns identified at this time.  Emotional Assessment Appearance:  Appears stated age Attitude/Demeanor/Rapport:   (Cooperative) Affect (typically observed):  Accepting, Appropriate Orientation:  Oriented to Self Alcohol / Substance use:  Not Applicable Psych involvement (Current and /or in the community):  No (Comment)  Discharge Needs  Concerns to be addressed:   (Monica [name]) Readmission within the last 30 days:  Yes Current discharge risk:  Chronically ill Barriers to Discharge:  Monica [name] Bed not available   Normajean Baxter, LCSW 09/16/2017, 11:25 AM

## 2017-09-16 NOTE — Progress Notes (Signed)
Daily Progress Note   Patient Name: Monica [name]       Date: 09/16/2017 DOB: May 22, 1932  Age: 83 y.o. MRN#: [id number] Attending Physician: Nita Sells, MD Primary Care Physician: Alvester Chou, NP Admit Date: 09/10/2017  Reason for Consultation/Follow-up: Establishing goals of care, Hospice Evaluation, Psychosocial/spiritual support and Terminal Care  Subjective: Monica [name] is more lethargic today. No complaints.   Length of Stay: 6  Current Medications: Scheduled Meds:  .  HYDROmorphone (DILAUDID) injection  0.25 mg Intravenous Q4H  . mouth rinse  15 mL Mouth Rinse q12n4p    Continuous Infusions: . sodium chloride 10 mL/hr at 09/15/17 1145    PRN Meds: acetaminophen **OR** acetaminophen, glycopyrrolate, HYDROmorphone (DILAUDID) injection, LORazepam  Physical Exam  Constitutional: She appears well-developed. She appears lethargic.  HENT:  Head: Normocephalic and atraumatic.  Cardiovascular: Normal rate and regular rhythm.   Pulmonary/Chest: Effort normal. No accessory muscle usage. No tachypnea. No respiratory distress.  Abdominal: Normal appearance.  Neurological: She appears lethargic. She is disoriented.  Nursing note and vitals reviewed.           Vital Signs: BP 139/69 (BP Location: Left Arm)   Pulse 94   Temp 98.1 F (36.7 C) (Oral)   Resp 18   Ht '5\' 5"'$  (1.651 m)   Wt 62.5 kg (137 lb 12.6 oz)   SpO2 99%   BMI 22.93 kg/m  SpO2: SpO2: 99 % O2 Device: O2 Device: Not Delivered O2 Flow Rate: O2 Flow Rate (L/min): 2 L/min  Intake/output summary:   Intake/Output Summary (Last 24 hours) at 09/16/17 1105 Last data filed at 09/16/17 0914  Gross per 24 hour  Intake              470 ml  Output                0 ml  Net              470 ml   LBM: Last BM  Date:  (rectal catheter) Baseline Weight: Weight: 56.7 kg (125 lb) Most recent weight: Weight: 62.5 kg (137 lb 12.6 oz)       Palliative Assessment/Data: 20%    Flowsheet Rows     Most Recent Value  Intake Tab  Referral Department  Critical care  Unit at Time of  Referral  ICU  Palliative Care Primary Diagnosis  Sepsis/Infectious Disease  Date Notified  09/10/17  Palliative Care Type  New Palliative care  Reason for referral  Clarify Goals of Care  Date of Admission  09/10/17  Date first seen by Palliative Care  09/12/17  # of days Palliative referral response time  2 Day(s)  # of days IP prior to Palliative referral  0  Clinical Assessment  Palliative Performance Scale Score  30%  Pain Max last 24 hours  Not able to report  Pain Min Last 24 hours  Not able to report  Dyspnea Max Last 24 Hours  Not able to report  Dyspnea Min Last 24 hours  Not able to report  Nausea Max Last 24 Hours  Not able to report  Nausea Min Last 24 Hours  Not able to report  Anxiety Max Last 24 Hours  Not able to report  Anxiety Min Last 24 Hours  Not able to report  Other Max Last 24 Hours  Not able to report  Psychosocial & Spiritual Assessment  Palliative Care Outcomes  Patient/Family meeting held?  Yes  Who was at the meeting?  met with dtr [place]  Provided psychosocial or spiritual support, Counseled regarding hospice  Palliative Care follow-up planned  Yes, Facility      Patient Active Problem List   Diagnosis Date Noted  . Altered mental status   . Palliative care by specialist   . Bacteremia due to Escherichia coli 09/12/2017  . Acidosis, metabolic   . Encephalopathy   . Hyponatremia   . End-stage renal disease on hemodialysis ([place])   . Hyperkalemia   . Accelerated hypertension   . Anxiety state (neurotic) 03/25/2017  . History of total right hip arthroplasty 03/23/2017  . Impaired mobility and ADLs 03/23/2017  . Lives alone with some help  available 03/23/2017  . Tooth pain 03/23/2017  . History of alcohol dependence ([place]) 03/23/2017  . History of gastric ulcer with bleeding 03/23/2017  . Sacral decubitus ulcer, stage II   . Anemia of renal disease 03/13/2017  . Malnutrition of moderate degree 03/11/2017  . Closed displaced fracture of right femoral neck ([place])   . Possible Cognitive impairment 10/23/2016  . Pancreatic cyst   . HTN (hypertension) 12/29/2013  . PAF (paroxysmal atrial fibrillation) ([place]) 12/29/2013    Palliative Care Assessment & Plan   HPI: 83 y.o.femalewith past medical history of Anemia, dementia, end-stage renal disease on hemodialysis (patient has been missing dialysis appointment since beginning dialysis in September 2018,) arthritis, atrial flutter, A. fib, hip fracture, Escherichia coli urinary tract infection, hypertension, protein calorie malnutrition, small bowel obstruction, upper GI bleed,admitted on 10/4/2018with encephalopathy. Upon arrival to the emergency room her potassium was 7.5 CO2 less than 7, lactic acid 2.39 and temperature 90.4 Fahrenheit. She also has positive blood cultures, Escherichia coli bacteremia. We have attempted to dialyze her beginning on 09/10/2017 the patient becomes so tachycardic that dialysis has had to be terminated before she could complete a full treatment. Additionally she has been hypoglycemic despite being on D10  Since progressing to stage V renal disease and startinghemodialysis,patient has been missing dialysis appointments. Per chart review, as well as discussion with daughter, patient has dementia, lives alone, and difficulty getting to dialysis as well as intolerance secondary to cramps.   Assessment: Monica [name] is more lethargic today and able to answer simple questions but immediately falling back to sleep. She has no complaints - just  wants to sleep and rest. No family at bedside. Unfortunately it seems our local hospice in [place] and [facility]  have no available beds today. Will continue comfort care here and hopefully transition to hospice facility if bed available and still appropriate. CSW following.   Recommendations/Plan:  Continue dilaudid 0.25 mg every 4 hours scheduled. Dilaudid 0.5 mg every 2 hours prn. Pain is managed.   No further complaints.   Goals of Care and Additional Recommendations:  Limitations on Scope of Treatment: Full Comfort Care  Code Status:  DNR  Prognosis:   Hours to days.   Discharge Planning:  Hospice facility   Thank you for allowing the Palliative Medicine Team to assist in the care of this patient.   Total Time 15 min Prolonged Time Billed  no       Greater than 50%  of this time was spent counseling and coordinating care related to the above assessment and plan.  Vinie Sill, NP Palliative Medicine Team Pager # [phone] (M-F 8a-5p) Team Phone # [phone] (Nights/Weekends)

## 2017-09-16 NOTE — Progress Notes (Signed)
[facility] Liaison:  RN   Received request from Broadus John, for family interest in [facility]  Chart reviewed.  I was unable to reach any family members at this time to acknowledge referral.  Unfortunately, [facility] is not able to offer a room today.  Elease Hashimoto, LCSW is aware that [facility] liaison will follow up with LCSW tomorrow or sooner if room becomes available.  Please do not hesitate to call with questions.  Thank you for this referral.  Adele Barthel, RN, BSN [facility] Liaison [phone]  All hospital liaisons are now on AMION.

## 2017-09-16 NOTE — Social Work (Addendum)
CSW received call from nurse that brother [name] was at bedside. CSW met with brother and friend peggy to discuss the disposition for patient. CSW discussed residential hospice placement and patient and son declined Hospice. Son indicated that he would take care of patient at home. CSW spoke with patient who confirmed that she wants to be home. CSW discussed next steps in the process and will f/u with RNCM to assist home hospice needs.   3:35pm-CSW received call from [facility] advising that another family friend was at bedside and doctor spoke with family again and they are encouraging patient to consider hospice.  CSW met with patient/family/friend and discussed residential hospice. Patient is now onboard with residential hospice. CSW discussed the need to send referrals to [facility] as [facility] does not have a bed. Patient aware and in agreement, despite the barriers for family to get to [facility].  CSW made referral to [facility] will follow up and indicated that they do not have any beds. CSW also made a referral to [facility] and spoke with Lesleigh Noe who will f/u with family. She confirmed that they do have possibly one available bed.  CSW will continue to follow.  Elissa Hefty, LCSW Clinical Social Worker [phone]

## 2017-09-16 NOTE — Progress Notes (Signed)
PROGRESS NOTE   Monica OLA  [name]:[id number]    DOB: [birth year]    DOA: 09/10/2017  PCP: Marletta Lor, NP   I have briefly reviewed patients previous medical records in [facility].  Brief Narrative:   85-year-female  ESRD on HD was chronically noncompliant,  HTN,  HLD,  hip fracture repair, dementia,  anemia,   was admitted to ICU by CCM on 09/10/17 for altered mental status. Workup revealed Escherichia coli bacteremia with sepsis. Concern for UTI or line sepsis. She was seen by renal, ID and vascular surgery. Started on antibiotics. HD catheter was removed. Palliative care was consulted. Family opted for DO NOT RESUSCITATE status and she was transitioned to comfort care.  She was transferred to medical floor and TRH service on 09/14/17.   Assessment & Plan:   Principal Problem:   Bacteremia due to Escherichia coli Active Problems:   Hyperkalemia   End-stage renal disease on hemodialysis (HCC)   Acidosis, metabolic   Encephalopathy   Altered mental status   Palliative care by specialist   Goals of care, counseling/discussion   Palliative care encounter   1. Acute toxic and metabolic encephalopathy: Attributed to sepsis +/- uremia. As per nephrology, was not compliant with outpatient hemodialysis. Family have discussed with hospice and are agreeablen to placement--have discussed personally with the patient who understands that with lack of dialysis her prognosis is weeks--dispo complicated by lack of bed at [facility] place at present time--I have asked family present in room today to collectively make  decision re: [place] or other hospice if beds are available at those locations as I feel patient has stabilized for d/c 2. Escherichia coli bacteremia/sepsis: Secondary to UTI versus HD catheter. HD catheter removed. Antibiotics discontinued. Comfort care. 3. ESRD on HD: HD catheter removed. No further dialysis. Comfort care. 4. Hyperkalemia:  Resolved. 5. AG Metabolic Acidosis: d/t ESRD. Comfort Care 6. Anemia: Secondary to chronic kidney disease and critical illness 7. Severe protein calorie malnutrition 8. A. fib with RVR 9. Hypoglycemia: Due to sepsis and ESRD.    DVT prophylaxis: None at this time. Comfort care. Code Status: DO NOT RESUSCITATE Family Communication: no beds at [facility] place--ave offered choice of hospice   Consultants:  CCM Nephrology Vascular surgery Infectious disease Palliative care medicine   Procedures:  HD catheter removed  Antimicrobials:  Alt discontinued    Subjective:  Doing good Having a soda Many visitors today Her former employer whom she has known over 30 yrs relates son is alcoholic and daughte ris on drugs--son in [place] hasnt seen mother in over 30 yrs Patient oriented enough to make decision  Objective:  Vitals:   09/15/17 0656 09/15/17 1438 09/15/17 2154 09/16/17 0552  BP: 137/78 (!) 171/92 (!) 168/72 139/69  Pulse: (!) 104 97 (!) 103 94  Resp: Temp: 98.6 F (37 C) 99 F (37.2 C) 98.6 F (37 C) 98.1 F (36.7 C)  TempSrc: Oral Oral Oral Oral  SpO2: 100% 100% 99% 99%  Weight:      Height:        Examination:  Thin asthenic, in good spirits Chest clear S1-S2 no murmur rub or gallop Abd is soft without rebound No lower extremity edema Neurologically seems intact moves limbs without deficit Is able to have fluent conversations with me--Does not seem confused    Data Reviewed: I have personally reviewed following labs and imaging studies  CBC:  Recent Labs Lab 09/10/17 1743  09/10/17 2028 09/10/17 2344 09/11/17 1354 09/12/17 0253 09/16/17 0432  WBC 13.0* 10.0 4.8 13.4* 21.1* 12.6*  NEUTROABS 11.3*  --   --   --  19.0*  --   HGB 9.4* 9.8* 10.2* 9.8* 8.7* 8.4*  HCT 29.3* 30.8* 30.8* 28.0* 24.2* 25.2*  MCV 88.5 88.0 86.5 81.2 79.3 81.8  PLT 227 234 247 198 173 280   Basic Metabolic Panel:  Recent Labs Lab 09/10/17 2344  09/11/17 0120 09/11/17 0345 09/11/17 1015 09/12/17 0253 09/16/17 0432  NA 137  --  138 139 137 136  K >7.5* 4.9 5.2* 4.7 4.3 4.3  CL 104  --  101 103 97* 98*  CO2 <7*  --  10* 10* 14* 17*  GLUCOSE 78  --  73 75 71 107*  BUN 138*  --  106* 113* 96* 119*  CREATININE 18.38*  --  14.34* 15.21* 12.49* 13.66*  CALCIUM 8.4*  --  8.1* 7.5* 7.4* 8.4*  MG 2.3  --  1.9  --   --   --   PHOS 11.7*  --  8.4*  --  6.5* 8.6*   Liver Function Tests:  Recent Labs Lab 09/10/17 1743 09/11/17 1015 09/16/17 0432  AST 19 33  --   ALT 10* 16  --   ALKPHOS 94 63  --   BILITOT 1.4* 1.2  --   PROT 7.4 6.2*  --   ALBUMIN 3.7 2.9* 2.6*   Coagulation Profile: No results for input(s): INR, PROTIME in the last 168 hours. Cardiac Enzymes:  Recent Labs Lab 09/10/17 1743 09/10/17 2028  CKTOTAL 268*  --   TROPONINI  --  0.04*   HbA1C: No results for input(s): HGBA1C in the last 72 hours. CBG:  Recent Labs Lab 09/12/17 0448 09/12/17 0803 09/12/17 0916 09/12/17 1147 09/12/17 1601  GLUCAP 112* 69 84 69 73    Radiology Studies: No results found.   Scheduled Meds: .  HYDROmorphone (DILAUDID) injection  0.25 mg Intravenous Q4H  . [START ON 09/17/2017] Influenza vac split quadrivalent PF  0.5 mL Intramuscular Tomorrow-1000  . mouth rinse  15 mL Mouth Rinse q12n4p   Continuous Infusions: . sodium chloride 10 mL/hr at 09/16/17 1400     LOS: 6 days    15 minutes  Pleas Koch, MD [facility] [phone]

## 2017-09-17 MED ORDER — MORPHINE SULFATE 10 MG/5ML PO SOLN: 2.5000 mg | Freq: Four times a day (QID) | ORAL | 0 refills | Status: AC | PRN

## 2017-09-17 NOTE — Discharge Summary (Signed)
Physician Discharge Summary  Monica [name] GNF:[id number] DOB: 09/24/1932 DOA: 09/10/2017  PCP: Marletta Lor, NP  Admit date: 09/10/2017 Discharge date: 09/17/2017  Time spent: 15 minutes  Recommendations for Outpatient Follow-up:  1. EOL care to be undertaken at [facility] place  Discharge Diagnoses:  Principal Problem:   Bacteremia due to Escherichia coli Active Problems:   Hyperkalemia   End-stage renal disease on hemodialysis (HCC)   Acidosis, metabolic   Encephalopathy   Altered mental status   Palliative care by specialist   Goals of care, counseling/discussion   Palliative care encounter   Discharge Condition: gaurded  Diet recommendation: comfort  Filed Weights   09/12/17 0500 09/12/17 1000 09/12/17 1100  Weight: 62.3 kg (137 lb 5.6 oz) 62.3 kg (137 lb 5.6 oz) 62.5 kg (137 lb 12.6 oz)    History of present illness:   85-year-female  ESRD on HD was chronically noncompliant,  HTN,  HLD,  hip fracture repair, dementia,  anemia,   was admitted to ICU by CCM on 09/10/17 for altered mental status. Workup revealed Escherichia coli bacteremia with sepsis. Concern for UTI or line sepsis. She was seen by renal, ID and vascular surgery. Started on antibiotics. HD catheter was removed. Palliative care was consulted. Family opted for DO NOT RESUSCITATE status and she was transitioned to comfort care.  She was transferred to medical floor and TRH service on 09/14/17.  She was transferred to [facility] place 10/11 for EOL care and [facility] care   Discharge Exam: Vitals:   09/16/17 2126 09/17/17 0501  BP: (!) 155/71 (!) 160/74  Pulse: (!) 101 100  Resp: 18 18  Temp: 99.5 F (37.5 C) 99.1 F (37.3 C)  SpO2: 99% 100%    General: eomi ncat-- alittle confused Cardiovascular: s1 s 2no m Respiratory: clear  Discharge Instructions   Discharge Instructions    Diet - low sodium heart healthy    Complete by:  As directed    Increase activity slowly    Complete by:  As  directed      Current Discharge Medication List    START taking these medications   Details  morphine 10 MG/5ML solution Take 1.3 mLs (2.6 mg total) by mouth every 6 (six) hours as needed for severe pain. Qty: 10 mL, Refills: 0      STOP taking these medications     ALPRAZolam (XANAX) 0.5 MG tablet      atorvastatin (LIPITOR) 40 MG tablet      metoprolol tartrate (LOPRESSOR) 25 MG tablet      pantoprazole (PROTONIX) 40 MG tablet      aspirin 81 MG EC tablet      calcitRIOL (ROCALTROL) 0.25 MCG capsule      nitroGLYCERIN (NITROSTAT) 0.4 MG SL tablet        Allergies  Allergen Reactions  . Aspirin Other (See Comments)    "makes my stomach hurt"  . Codeine Other (See Comments)    "i get irritated" Patient tolerates Tramadol without difficulty.       The results of significant diagnostics from this hospitalization (including imaging, microbiology, ancillary and laboratory) are listed below for reference.    Significant Diagnostic Studies: Ct Head Wo Contrast  Result Date: 09/10/2017 CLINICAL DATA:  Altered level of consciousness. EXAM: CT HEAD WITHOUT CONTRAST TECHNIQUE: Contiguous axial images were obtained from the base of the skull through the vertex without intravenous contrast. COMPARISON:  03/09/2017 FINDINGS: Brain: No evidence of acute infarction, hemorrhage, hydrocephalus, extra-axial collection or mass  lesion/mass effect.Prominence of the sulci and ventricles are identified and appears unchanged when compared with previous exam. Low attenuation within the subcortical and periventricular white matter compatible with chronic small vessel ischemic change. Vascular: No hyperdense vessel or unexpected calcification. Skull: Normal. Negative for fracture or focal lesion. Sinuses/Orbits: No acute finding. Other: None. IMPRESSION: 1. No acute intracranial abnormalities. 2. Chronic atrophy and small vessel ischemic change. Electronically Signed   By: Signa Kell M.D.   On:  09/10/2017 18:46   Dg Chest Port 1 View  Result Date: 09/12/2017 CLINICAL DATA:  Follow-up, sepsis; patient on dialysis. Hx of atrial flutter, hypertension, av fistula placement. Former [phone]). EXAM: PORTABLE CHEST 1 VIEW COMPARISON:  Chest x-rays dated 09/10/2017 and 03/09/2017. FINDINGS: Study is hypoinspiratory with crowding of the perihilar and bibasilar bronchovascular markings. Given the low lung volumes, lungs appear clear with no evidence of pneumonia or pulmonary edema. No pleural effusion or pneumothorax seen. Heart size and mediastinal contours are within normal limits. Atherosclerotic changes noted at the aortic arch. Dialysis catheter appears stable in position. No acute or suspicious osseous finding. IMPRESSION: Low lung volumes. No active disease. No evidence of pneumonia or pulmonary edema. Electronically Signed   By: Bary Richard M.D.   On: 09/12/2017 11:16   Dg Chest Port 1 View  Result Date: 09/10/2017 CLINICAL DATA:  Sepsis.  Missed dialysis. EXAM: PORTABLE CHEST 1 VIEW COMPARISON:  03/09/2017. FINDINGS: Low lung volumes on this AP portable semi erect film. Dialysis catheter tips at the cavoatrial junction. Cardiomegaly. Mild vascular congestion, borderline edema. No pneumothorax. Degenerative change both shoulders. IMPRESSION: Cardiomegaly. Mild vascular congestion, borderline edema. Worsening aeration from priors. Electronically Signed   By: Elsie Stain M.D.   On: 09/10/2017 18:26   Dg Abd Portable 1v  Result Date: 09/10/2017 CLINICAL DATA:  ESRD on HD who is chronically noncompliant. Pt AMS and unable to provide any info; possible abdominal pain EXAM: PORTABLE ABDOMEN - 1 VIEW COMPARISON:  03/13/2017, 06/15/2017 FINDINGS: The gas pattern is nonobstructed. There is gaseous distension of the stomach. The patient has had hemiarthroplasty of the right hip. IMPRESSION: Nonobstructive bowel gas pattern. Electronically Signed   By: Norva Pavlov M.D.   On: 09/10/2017 20:59    US Abdomen Limited Ruq  Result Date: 09/11/2017 CLINICAL DATA:  Right upper quadrant abdominal pain and tenderness. EXAM: ULTRASOUND ABDOMEN LIMITED RIGHT UPPER QUADRANT COMPARISON:  CT 08/13/2016 FINDINGS: Gallbladder: No gallstones or wall thickening visualized. No sonographic Murphy sign noted by sonographer. Common bile duct: Diameter: 2.4 mm, normal Liver: No focal lesion identified. Within normal limits in parenchymal echogenicity. Portal vein is patent on color Doppler imaging with normal direction of blood flow towards the liver. IMPRESSION: Normal right upper quadrant ultrasound. Electronically Signed   By: Paulina Fusi M.D.   On: 09/11/2017 11:11    Microbiology: Recent Results (from the past 240 hour(s))  Blood Culture (routine x 2)     Status: Abnormal   Collection Time: 09/10/17  5:45 PM  Result Value Ref Range Status   Specimen Description BLOOD RIGHT ANTECUBITAL  Final   Special Requests   Final    BOTTLES DRAWN AEROBIC AND ANAEROBIC Blood Culture adequate volume   Culture  Setup Time   Final    GRAM NEGATIVE RODS IN BOTH AEROBIC AND ANAEROBIC BOTTLES CRITICAL RESULT CALLED TO, READ BACK BY AND VERIFIED WITH: PHARMD N BATCHELDER [phone] MLM    Culture ESCHERICHIA COLI (A)  Final   Report Status 09/13/2017 FINAL  Final  Organism ID, Bacteria ESCHERICHIA COLI  Final      Susceptibility   Escherichia coli - MIC*    AMPICILLIN <=2 SENSITIVE Sensitive     CEFAZOLIN <=4 SENSITIVE Sensitive     CEFEPIME <=1 SENSITIVE Sensitive     CEFTAZIDIME <=1 SENSITIVE Sensitive     CEFTRIAXONE <=1 SENSITIVE Sensitive     CIPROFLOXACIN <=0.25 SENSITIVE Sensitive     GENTAMICIN <=1 SENSITIVE Sensitive     IMIPENEM <=0.25 SENSITIVE Sensitive     TRIMETH/SULFA <=20 SENSITIVE Sensitive     AMPICILLIN/SULBACTAM <=2 SENSITIVE Sensitive     PIP/TAZO <=4 SENSITIVE Sensitive     Extended ESBL NEGATIVE Sensitive     * ESCHERICHIA COLI  Blood Culture ID Panel (Reflexed)     Status:  Abnormal   Collection Time: 09/10/17  5:45 PM  Result Value Ref Range Status   Enterococcus species NOT DETECTED NOT DETECTED Final   Listeria monocytogenes NOT DETECTED NOT DETECTED Final   Staphylococcus species NOT DETECTED NOT DETECTED Final   Staphylococcus aureus NOT DETECTED NOT DETECTED Final   Streptococcus species NOT DETECTED NOT DETECTED Final   Streptococcus agalactiae NOT DETECTED NOT DETECTED Final   Streptococcus pneumoniae NOT DETECTED NOT DETECTED Final   Streptococcus pyogenes NOT DETECTED NOT DETECTED Final   Acinetobacter baumannii NOT DETECTED NOT DETECTED Final   Enterobacteriaceae species DETECTED (A) NOT DETECTED Final    Comment: Enterobacteriaceae represent a large family of gram-negative bacteria, not a single organism. CRITICAL RESULT CALLED TO, READ BACK BY AND VERIFIED WITH: PHARMD N BATCHELDER [phone] MLM    Enterobacter cloacae complex NOT DETECTED NOT DETECTED Final   Escherichia coli DETECTED (A) NOT DETECTED Final    Comment: CRITICAL RESULT CALLED TO, READ BACK BY AND VERIFIED WITH: PHARMD N BATCHELDER [phone] MLM    Klebsiella oxytoca NOT DETECTED NOT DETECTED Final   Klebsiella pneumoniae NOT DETECTED NOT DETECTED Final   Proteus species NOT DETECTED NOT DETECTED Final   Serratia marcescens NOT DETECTED NOT DETECTED Final   Carbapenem resistance NOT DETECTED NOT DETECTED Final   Haemophilus influenzae NOT DETECTED NOT DETECTED Final   Neisseria meningitidis NOT DETECTED NOT DETECTED Final   Pseudomonas aeruginosa NOT DETECTED NOT DETECTED Final   Candida albicans NOT DETECTED NOT DETECTED Final   Candida glabrata NOT DETECTED NOT DETECTED Final   Candida krusei NOT DETECTED NOT DETECTED Final   Candida parapsilosis NOT DETECTED NOT DETECTED Final   Candida tropicalis NOT DETECTED NOT DETECTED Final  Blood Culture (routine x 2)     Status: Abnormal   Collection Time: 09/10/17  5:48 PM  Result Value Ref Range Status   Specimen  Description BLOOD RIGHT ARM  Final   Special Requests   Final    BOTTLES DRAWN AEROBIC AND ANAEROBIC Blood Culture adequate volume   Culture  Setup Time   Final    GRAM NEGATIVE RODS IN BOTH AEROBIC AND ANAEROBIC BOTTLES CRITICAL VALUE NOTED.  VALUE IS CONSISTENT WITH PREVIOUSLY REPORTED AND CALLED VALUE.    Culture (A)  Final    ESCHERICHIA COLI SUSCEPTIBILITIES PERFORMED ON PREVIOUS CULTURE WITHIN THE LAST 5 DAYS.    Report Status 09/13/2017 FINAL  Final  MRSA PCR Screening     Status: None   Collection Time: 09/10/17  9:25 PM  Result Value Ref Range Status   MRSA by PCR NEGATIVE NEGATIVE Final    Comment:        The GeneXpert MRSA Assay (FDA approved for NASAL specimens  only), is one component of a comprehensive MRSA colonization surveillance program. It is not intended to diagnose MRSA infection nor to guide or monitor treatment for MRSA infections.   Culture, blood (single)     Status: None   Collection Time: 09/11/17 12:02 AM  Result Value Ref Range Status   Specimen Description BLOOD LEFT ANTECUBITAL  Final   Special Requests   Final    BOTTLES DRAWN AEROBIC AND ANAEROBIC Blood Culture adequate volume   Culture NO GROWTH 5 DAYS  Final   Report Status 09/16/2017 FINAL  Final  C difficile quick scan w PCR reflex     Status: None   Collection Time: 09/11/17 11:20 AM  Result Value Ref Range Status   C Diff antigen NEGATIVE NEGATIVE Final   C Diff toxin NEGATIVE NEGATIVE Final   C Diff interpretation No C. difficile detected.  Final  Cath Tip Culture     Status: None   Collection Time: 09/12/17  1:28 PM  Result Value Ref Range Status   Specimen Description CATH TIP  Final   Special Requests NONE  Final   Culture NO GROWTH 3 DAYS  Final   Report Status 09/15/2017 FINAL  Final     Labs: Basic Metabolic Panel:  Recent Labs Lab 09/10/17 2344 09/11/17 0120 09/11/17 0345 09/11/17 1015 09/12/17 0253 09/16/17 0432  NA 137  --  138 139 137 136  K >7.5* 4.9 5.2*  4.7 4.3 4.3  CL 104  --  101 103 97* 98*  CO2 <7*  --  10* 10* 14* 17*  GLUCOSE 78  --  73 75 71 107*  BUN 138*  --  106* 113* 96* 119*  CREATININE 18.38*  --  14.34* 15.21* 12.49* 13.66*  CALCIUM 8.4*  --  8.1* 7.5* 7.4* 8.4*  MG 2.3  --  1.9  --   --   --   PHOS 11.7*  --  8.4*  --  6.5* 8.6*   Liver Function Tests:  Recent Labs Lab 09/10/17 1743 09/11/17 1015 09/16/17 0432  AST 19 33  --   ALT 10* 16  --   ALKPHOS 94 63  --   BILITOT 1.4* 1.2  --   PROT 7.4 6.2*  --   ALBUMIN 3.7 2.9* 2.6*   No results for input(s): LIPASE, AMYLASE in the last 168 hours. No results for input(s): AMMONIA in the last 168 hours. CBC:  Recent Labs Lab 09/10/17 1743 09/10/17 2028 09/10/17 2344 09/11/17 1354 09/12/17 0253 09/16/17 0432  WBC 13.0* 10.0 4.8 13.4* 21.1* 12.6*  NEUTROABS 11.3*  --   --   --  19.0*  --   HGB 9.4* 9.8* 10.2* 9.8* 8.7* 8.4*  HCT 29.3* 30.8* 30.8* 28.0* 24.2* 25.2*  MCV 88.5 88.0 86.5 81.2 79.3 81.8  PLT 227 234 247 198 173 280   Cardiac Enzymes:  Recent Labs Lab 09/10/17 1743 09/10/17 2028  CKTOTAL 268*  --   TROPONINI  --  0.04*   BNP: BNP (last 3 results) No results for input(s): BNP in the last 8760 hours.  ProBNP (last 3 results) No results for input(s): PROBNP in the last 8760 hours.  CBG:  Recent Labs Lab 09/12/17 0448 09/12/17 0803 09/12/17 0916 09/12/17 1147 09/12/17 1601  GLUCAP 112* 69 84 69 73       Signed:  Rhetta Mura MD   [facility] 09/17/2017, 10:08 AM

## 2017-09-17 NOTE — Social Work (Signed)
CSW received call from Amy with [facility] and she could not get in touch with family to complete paperwork.  CSW also called son and daughter and was unable to reach. CSW then advised Amy to follow up with patient as patient was alert and may be able to complete her own paperwork for the transition to Hospice.  CSW will continue to follow.  Keene Breath, LCSW Clinical Social Worker [phone]

## 2017-09-17 NOTE — Progress Notes (Signed)
[facility] Liaison:  RN visit Received request from Jodean Lima, for family interest in [facility] with request for transfer today.  Chart reviewed.  Met with patient and attorney, Jana Half, to confirm interest and explain services.  Family agreeable to transfer today (talked with [name], son, over the phone).  Mardene Celeste, LCSW, aware.  Registration paperwork completed today at 1115am.  Dr. Orpah Melter to assume care per family request.  Please fax discharge summary to [phone].  RN, please call report to [phone].  Please arrange transport for patient to arrive as soon as possible.    Thank you for the referral.    Edyth Gunnels, RN, BSN [facility] Liaison [phone]  All hospital liaisons are now on [place].

## 2017-09-17 NOTE — Progress Notes (Signed)
Patient d/c and taken out of pyxis, wasted 0.75 ml of Dilaudid with Shanda Bumps in sharps container.

## 2017-09-17 NOTE — Progress Notes (Addendum)
1300 Report was given to [facility] at [facility]. Discharged pt to [facility] place via [place].  Pt's daughter Geoffery Spruce called, aware that pt is going to [facility]. Pt does not want more information given to daughter.  Wasted Dilaudid 0.98ml, witnessed by Hoyle Barr.

## 2017-09-17 NOTE — Social Work (Signed)
Clinical Social Worker facilitated patient discharge including contacting patient family and facility to confirm patient discharge plans.  Clinical information faxed to facility and family agreeable with plan.    CSW arranged ambulance transport via [facility] to [facility].    RN to call [phone] to give report prior to discharge.  Clinical Social Worker will sign off for now as social work intervention is no longer needed. Please consult us again if new need arises.  Alyene Predmore, LCSW Clinical Social Worker [phone]    

## 2017-10-08 DEATH — deceased

## 2018-06-15 IMAGING — CT CT CERVICAL SPINE W/O CM
5 of 8 series · 11 of 33 positions shown, 12 images · non-contrast
Comparison: 07/18/2011 head CT

CLINICAL DATA: Fall with altered mental status

EXAM:
CT HEAD WITHOUT CONTRAST
CT CERVICAL SPINE WITHOUT CONTRAST
TECHNIQUE: Multidetector CT imaging of the head and cervical spine was
performed following the standard protocol without intravenous
contrast. Multiplanar CT image reconstructions of the cervical spine
were also generated.

[Series 4: head bone · axial · 0.39mm/px · z∈[+1365,+1413]mm · 2 of 73 slices shown]
[im 25/73  bone]
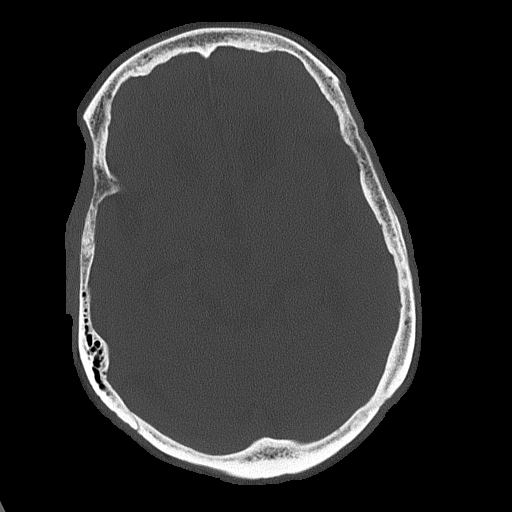
[im 49/73  bone]
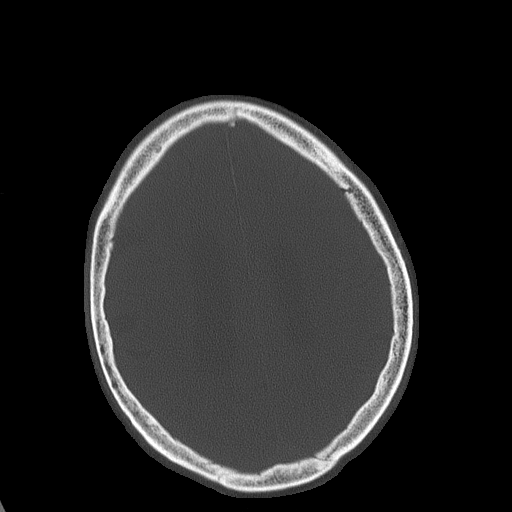

[Series 10: c_spine 2.0 orthogonals · axial · 0.21mm/px · z∈[+1190,+1239]mm · 2 of 80 slices shown, 3 images]
[im 27/80  soft-tissue]
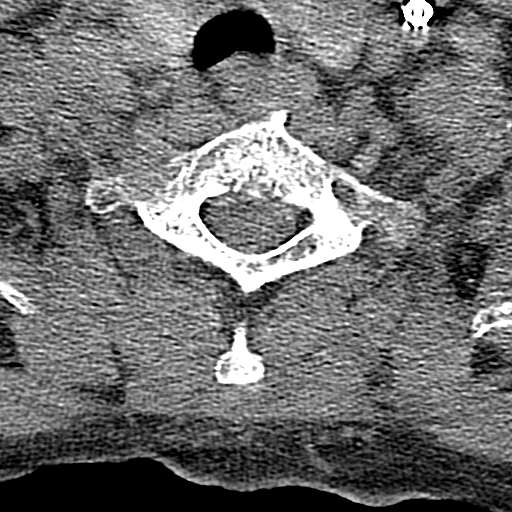
[im 27/80  bone]
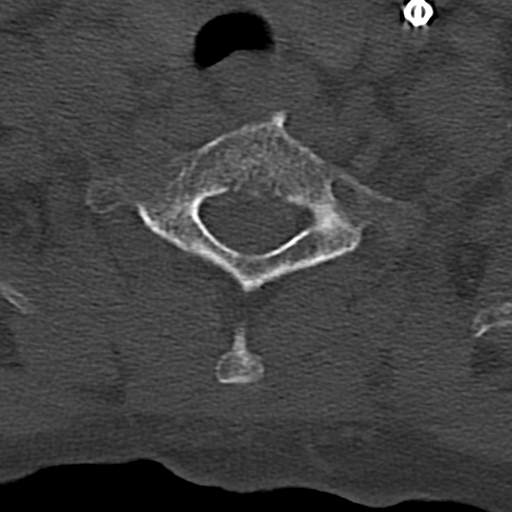
[im 53/80  bone]
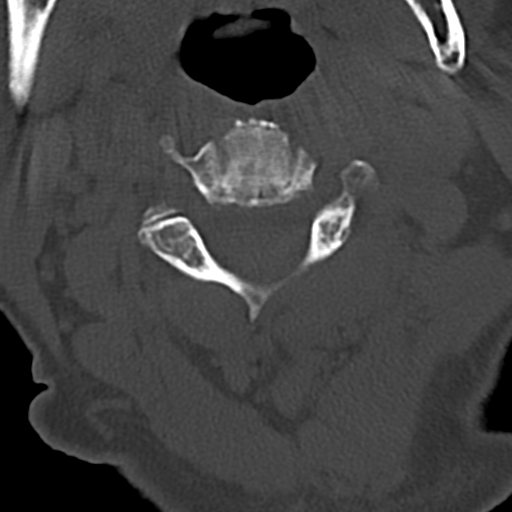

[Series 11: c_spine 2.0 sag bone · sagittal · 0.23mm/px · 4 of 61 slices shown]
[im 13/61  bone]
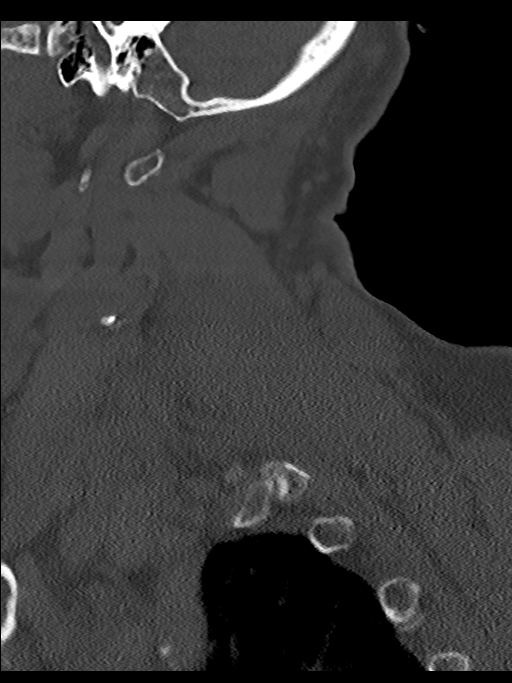
[im 25/61  bone]
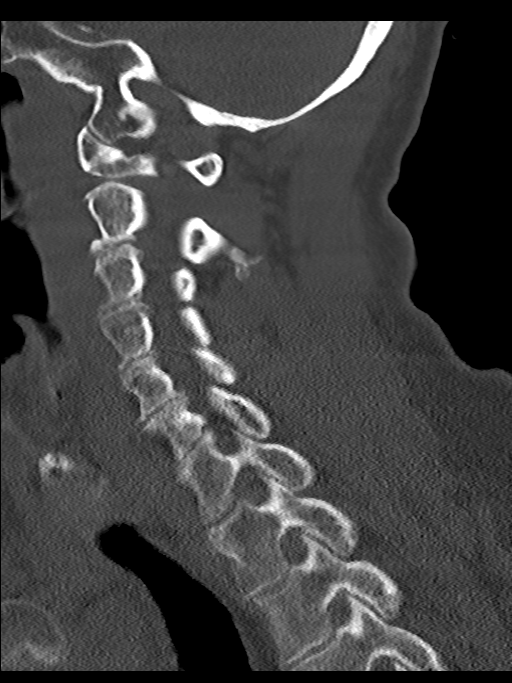
[im 37/61  bone]
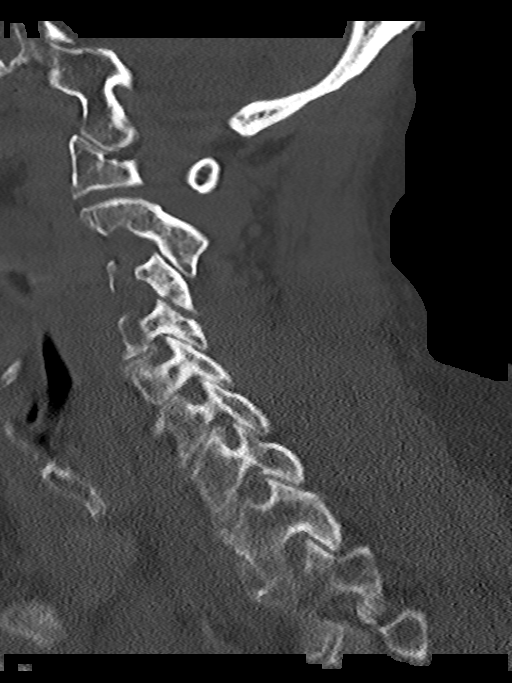
[im 49/61  bone]
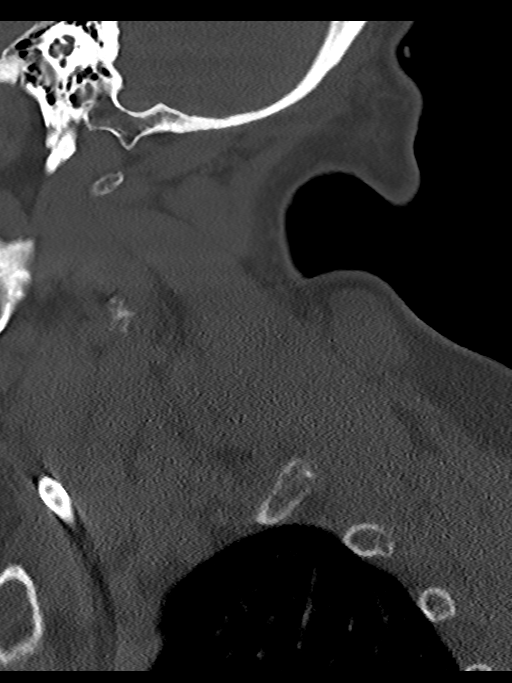

[Series 12: c_spine 2.0 cor bone · coronal · 0.23mm/px · 1 of 61 slices shown]
[im 31/61  bone]
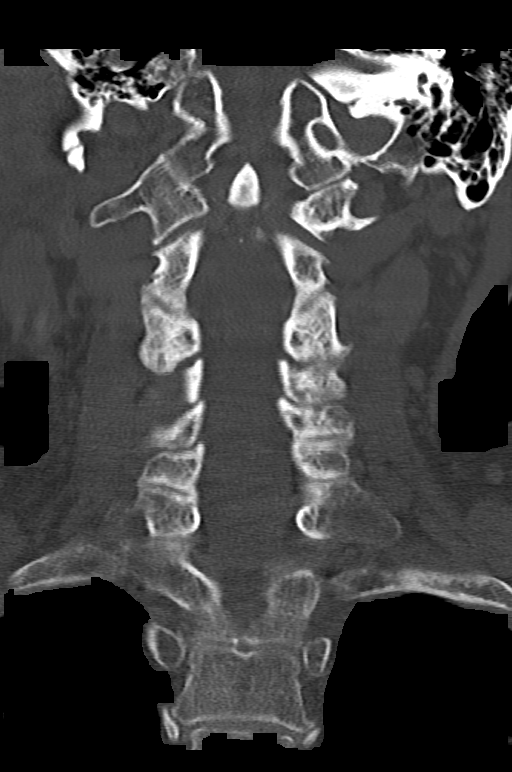

[Series 14: c_spine 2.0 st · axial · 0.28mm/px · z∈[+1213,+1263]mm · 2 of 75 slices shown]
[im 25/75  bone]
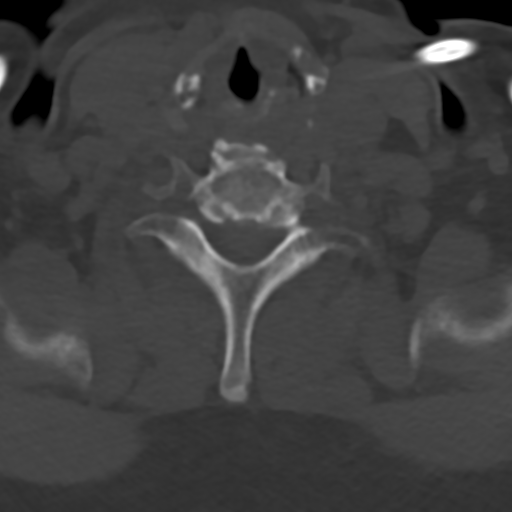
[im 50/75  bone]
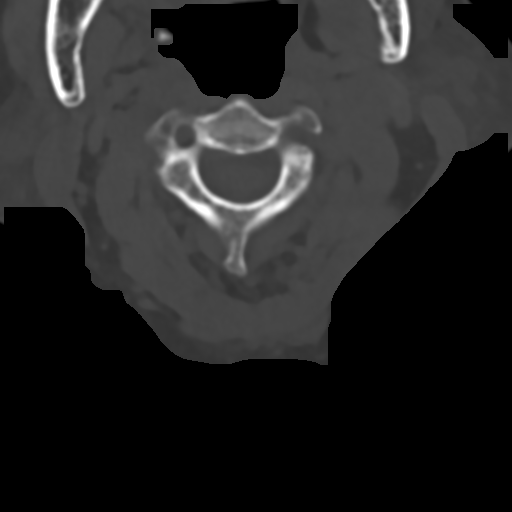

[11 of 33 positions shown; findings below may reference images not displayed]

FINDINGS: CT HEAD FINDINGS

Brain: No mass lesion, intraparenchymal hemorrhage or extra-axial
collection. No evidence of acute cortical infarct. Ventriculomegaly
is unchanged compared to the prior examination, likely secondary to
chronic volume loss.

Vascular: No hyperdense vessel or unexpected calcification.

Skull: Normal visualized skull base, calvarium and extracranial soft
tissues.

Sinuses/Orbits: No sinus fluid levels or advanced mucosal
thickening. No mastoid effusion. Normal orbits.

CT CERVICAL SPINE FINDINGS

Alignment: No static subluxation. Facets are aligned. Occipital
condyles are normally positioned.

Skull base and vertebrae: No acute fracture.

Soft tissues and spinal canal: No prevertebral fluid or swelling. No
visible canal hematoma.

Disc levels: Multilevel cervical degenerative disc disease without
advanced spinal canal or neural foraminal stenosis.

Upper chest: No pneumothorax, pulmonary nodule or pleural effusion.

Other: Normal visualized paraspinal cervical soft tissues.
IMPRESSION: 1. Chronic volume loss, unchanged, without acute intracranial
abnormality.
2. No acute fracture or static subluxation of the cervical spine.

## 2018-06-15 IMAGING — CR DG LUMBAR SPINE COMPLETE 4+V
5 series · 5 of 5 positions shown · non-contrast
Comparison: CT scan of August 13, 2016.

CLINICAL DATA: Low back pain after fall.

EXAM:
LUMBAR SPINE - COMPLETE 4+ VIEW

[l-spine ap]
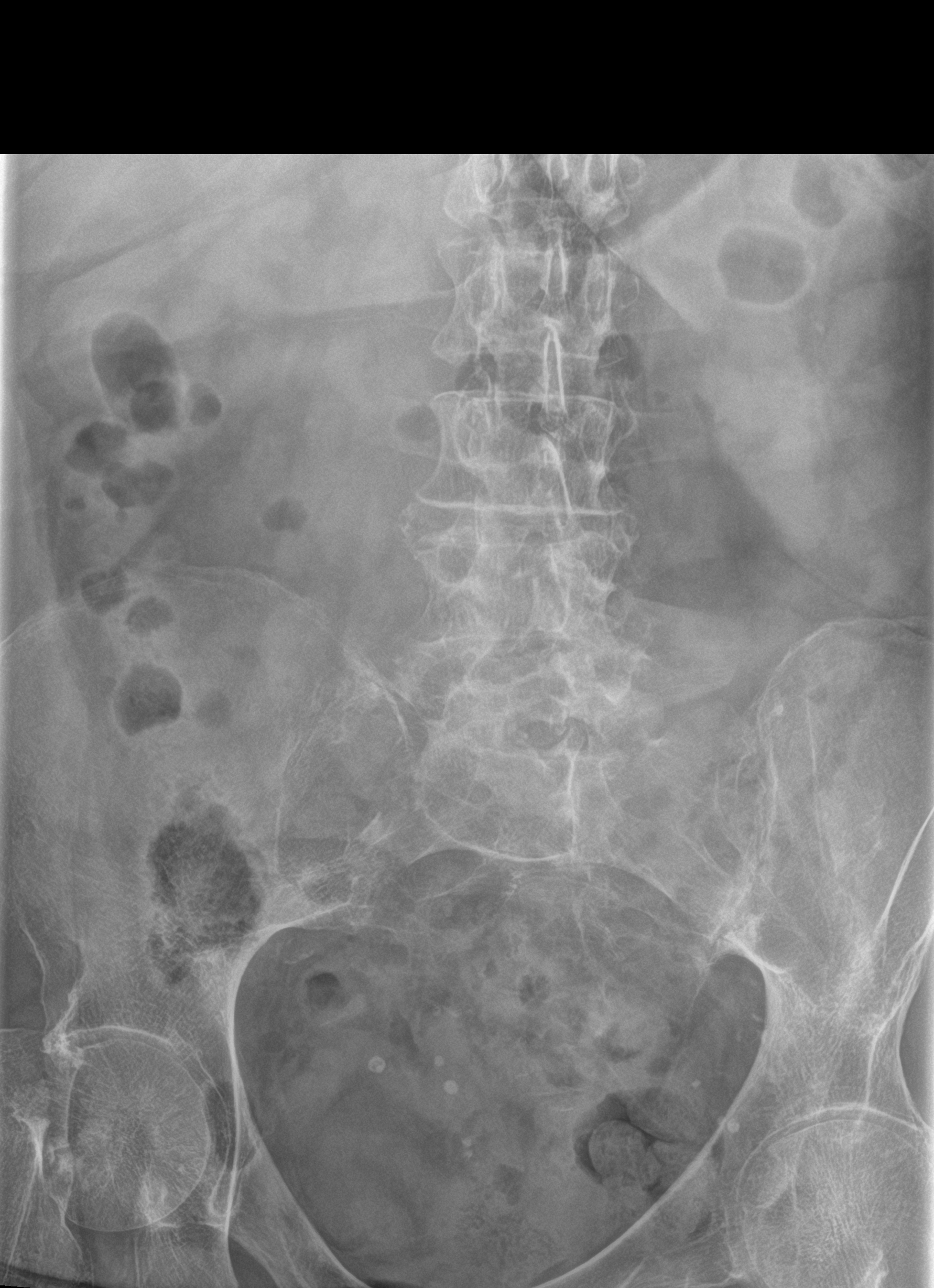

[l-spine obl (1 of 2)]
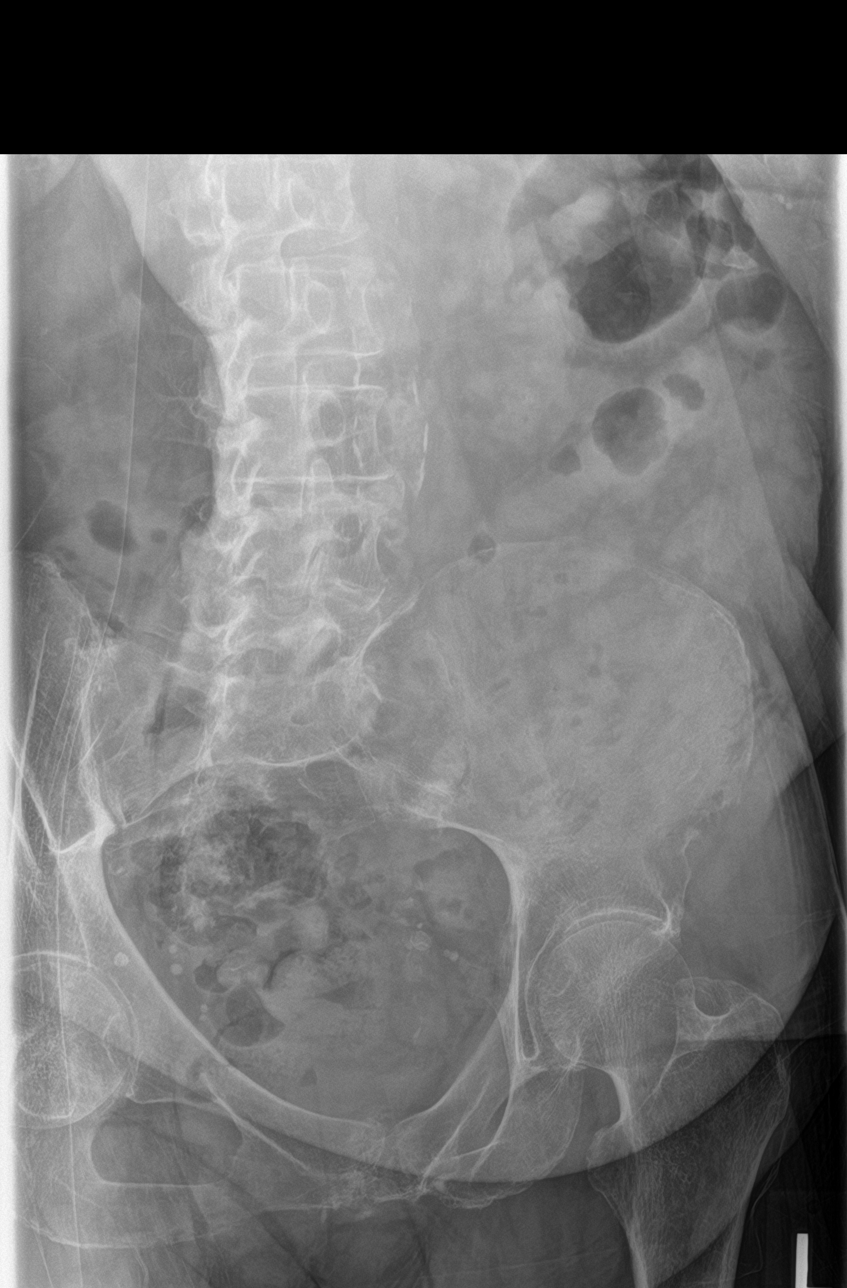

[l-spine obl (2 of 2)]
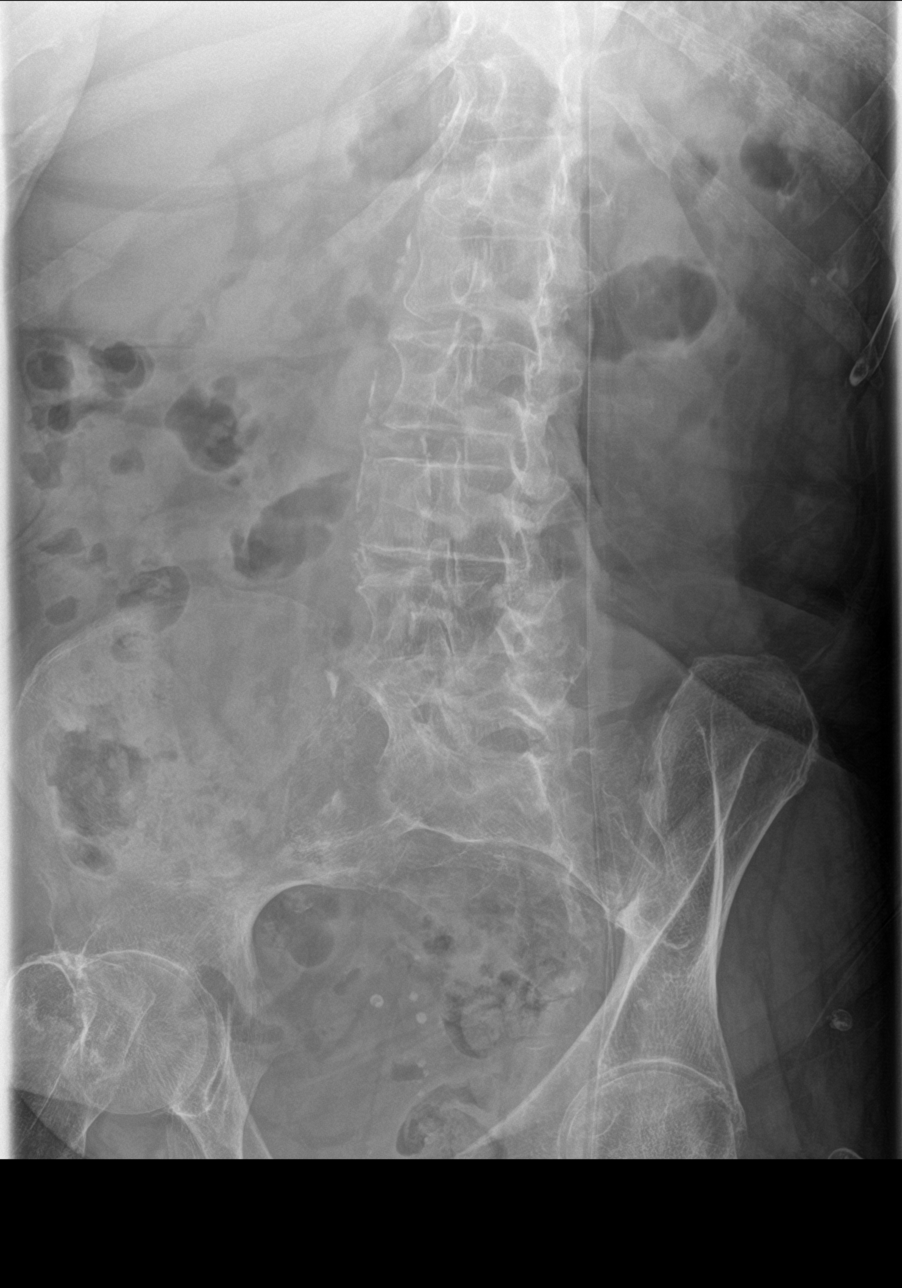

[l-spine lat]
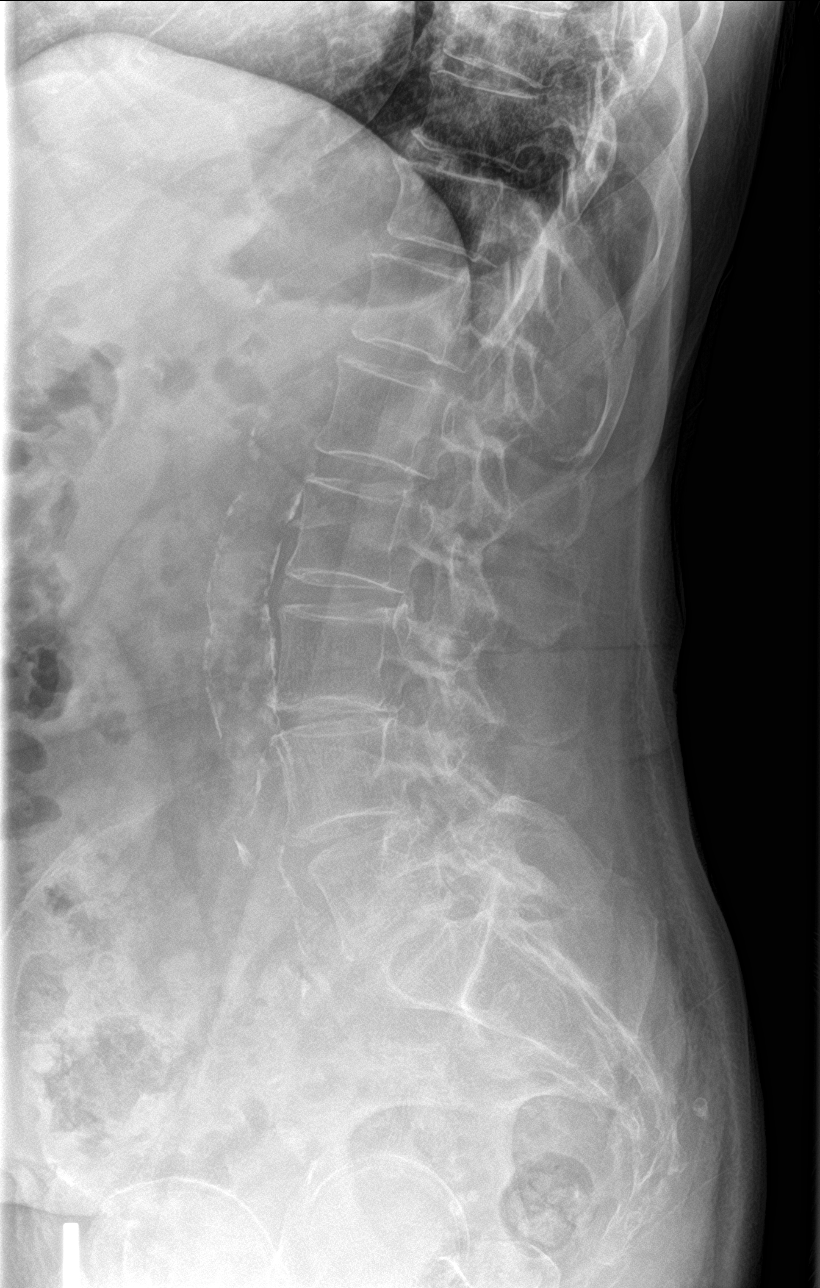

[l-spine spot]
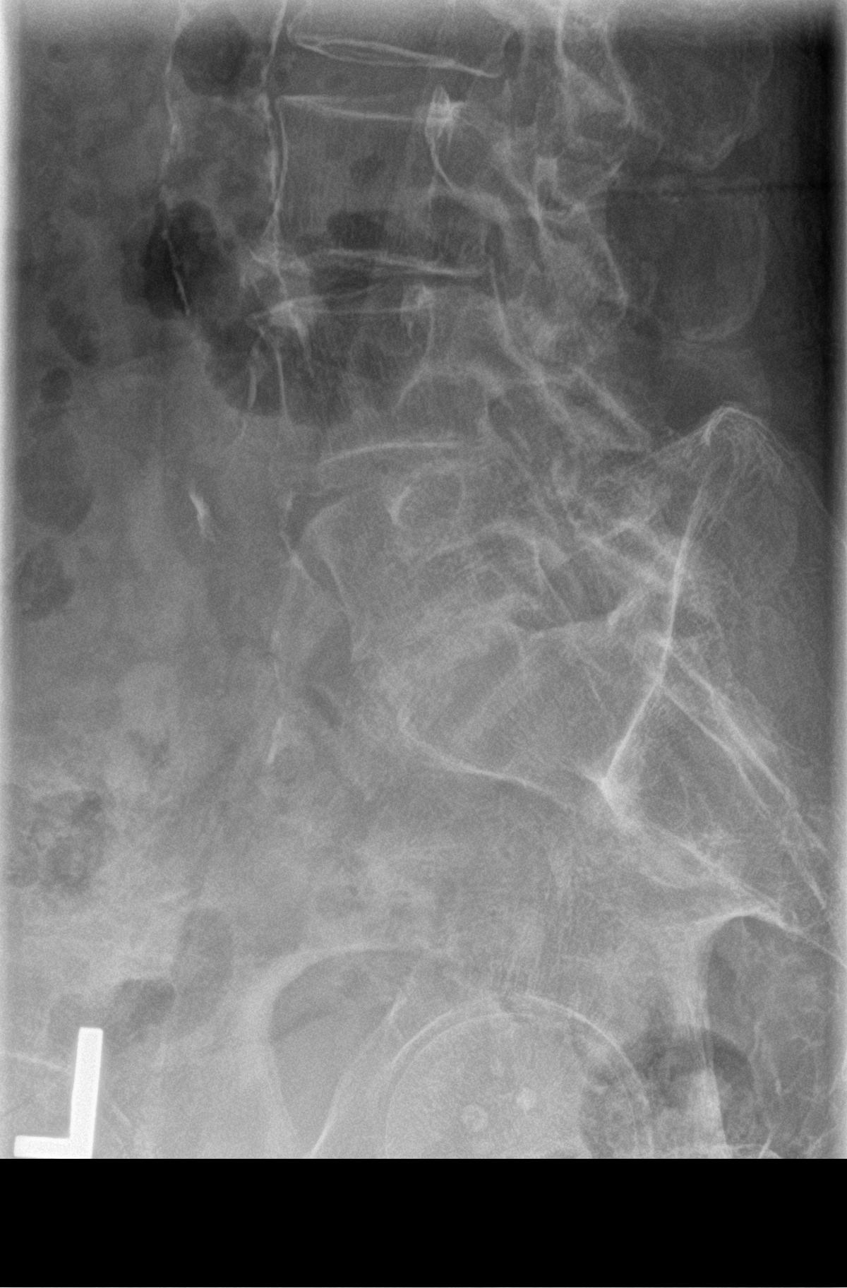

[5 of 5 positions shown; findings below may reference images not displayed]

FINDINGS: Diffuse osteopenia is noted. Atherosclerosis thoracic aorta is
noted. No fracture or spondylolisthesis is noted. Mild degenerative
disc disease is noted at L3-4, L4-5 and L5-S1.
IMPRESSION: Mild multilevel degenerative disc disease. Aortic atherosclerosis.
No acute abnormality seen in the lumbar spine.

## 2018-06-15 IMAGING — CR DG CHEST 1V
1 series · 1 of 1 positions shown · non-contrast
Comparison: Radiograph November 25, 2016.

CLINICAL DATA: Fall.

EXAM:
CHEST 1 VIEW

[chest ap]
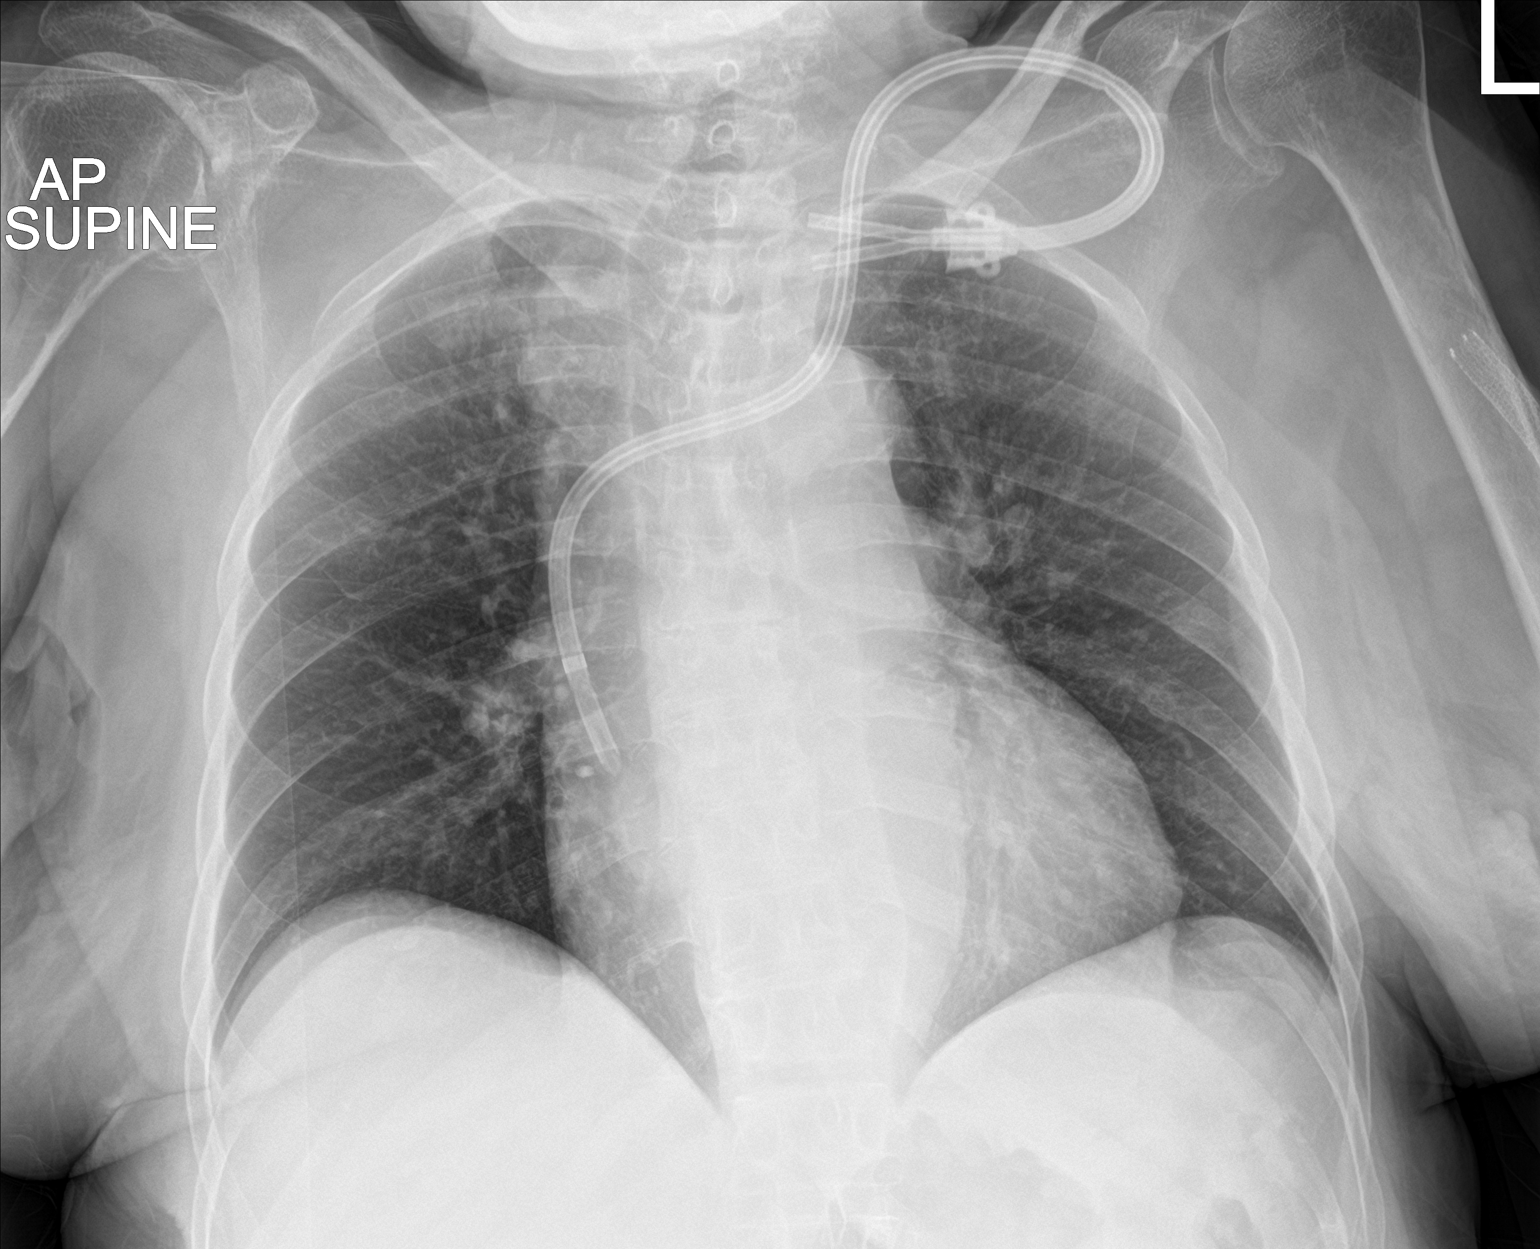

[1 of 1 positions shown; findings below may reference images not displayed]

FINDINGS: Stable cardiomediastinal silhouette. Left internal jugular dialysis
catheter is unchanged in position. No pneumothorax or pleural
effusion is noted. No acute pulmonary disease is noted. Bony thorax
is unremarkable.
IMPRESSION: No acute cardiopulmonary abnormality seen.
